# Patient Record
Sex: Male | Born: 1950
Health system: Southern US, Community
[De-identification: ages and names within clinical notes are randomized; demographics above are authoritative.]

## PROBLEM LIST (undated history)

## (undated) DIAGNOSIS — J45909 Unspecified asthma, uncomplicated: Secondary | ICD-10-CM

## (undated) DIAGNOSIS — I5020 Unspecified systolic (congestive) heart failure: Secondary | ICD-10-CM

## (undated) DIAGNOSIS — Z720 Tobacco use: Secondary | ICD-10-CM

## (undated) DIAGNOSIS — I10 Essential (primary) hypertension: Secondary | ICD-10-CM

## (undated) DIAGNOSIS — I509 Heart failure, unspecified: Secondary | ICD-10-CM

## (undated) DIAGNOSIS — C61 Malignant neoplasm of prostate: Secondary | ICD-10-CM

## (undated) DIAGNOSIS — I701 Atherosclerosis of renal artery: Secondary | ICD-10-CM

## (undated) DIAGNOSIS — F1010 Alcohol abuse, uncomplicated: Secondary | ICD-10-CM

## (undated) DIAGNOSIS — M109 Gout, unspecified: Secondary | ICD-10-CM

## (undated) DIAGNOSIS — I2510 Atherosclerotic heart disease of native coronary artery without angina pectoris: Secondary | ICD-10-CM

## (undated) DIAGNOSIS — J449 Chronic obstructive pulmonary disease, unspecified: Secondary | ICD-10-CM

## (undated) DIAGNOSIS — I428 Other cardiomyopathies: Secondary | ICD-10-CM

## (undated) DIAGNOSIS — I351 Nonrheumatic aortic (valve) insufficiency: Secondary | ICD-10-CM

## (undated) DIAGNOSIS — I480 Paroxysmal atrial fibrillation: Secondary | ICD-10-CM

## (undated) DIAGNOSIS — I340 Nonrheumatic mitral (valve) insufficiency: Secondary | ICD-10-CM

## (undated) DIAGNOSIS — N182 Chronic kidney disease, stage 2 (mild): Secondary | ICD-10-CM

## (undated) HISTORY — DX: Unspecified asthma, uncomplicated: J45.909

## (undated) HISTORY — PX: PROSTATE BIOPSY: SHX241

## (undated) HISTORY — DX: Malignant neoplasm of prostate: C61

## (undated) HISTORY — PX: APPENDECTOMY: SHX54

## (undated) HISTORY — DX: Essential (primary) hypertension: I10

---

## 2005-06-11 ENCOUNTER — Emergency Department (HOSPITAL_COMMUNITY): Admission: EM | Admit: 2005-06-11 | Discharge: 2005-06-11 | Payer: Self-pay | Admitting: Family Medicine

## 2006-11-28 ENCOUNTER — Emergency Department (HOSPITAL_COMMUNITY): Admission: EM | Admit: 2006-11-28 | Discharge: 2006-11-28 | Payer: Self-pay | Admitting: Emergency Medicine

## 2007-09-25 ENCOUNTER — Emergency Department (HOSPITAL_COMMUNITY): Admission: EM | Admit: 2007-09-25 | Discharge: 2007-09-25 | Payer: Self-pay | Admitting: Emergency Medicine

## 2010-11-18 LAB — URINALYSIS, ROUTINE W REFLEX MICROSCOPIC
Bilirubin Urine: NEGATIVE
Glucose, UA: NEGATIVE
Ketones, ur: NEGATIVE
Leukocytes, UA: NEGATIVE
Nitrite: NEGATIVE
Protein, ur: NEGATIVE
Specific Gravity, Urine: 1.014
Urobilinogen, UA: 1
pH: 5

## 2010-11-18 LAB — URINE MICROSCOPIC-ADD ON

## 2013-06-25 ENCOUNTER — Telehealth: Payer: Self-pay | Admitting: *Deleted

## 2013-06-25 NOTE — Telephone Encounter (Signed)
Called patient to introduce myself as Prostate Oncology Navigator and coordinator of the Prostate [place], to confirm his referral for the clinic on 07/03/13, location of [place], arrival time of 12:15, registration procedure, and format of clinic.  He verbalized understanding.  I provided my phone number and encouraged him to call me if he has any questions after receiving the Information Packet or prior to my call the day before clinic.  He verbalized understanding and expressed appreciation for my call.  Gayleen Orem, RN, BSN, [facility] Prostate Oncology Navigator [phone]

## 2013-06-28 ENCOUNTER — Telehealth: Payer: Self-pay | Admitting: Oncology

## 2013-06-28 NOTE — Telephone Encounter (Signed)
C/D 06/28/13 for appt. 07/03/13 °

## 2013-06-29 ENCOUNTER — Encounter: Payer: Self-pay | Admitting: Radiation Oncology

## 2013-06-29 ENCOUNTER — Telehealth: Payer: Self-pay | Admitting: *Deleted

## 2013-06-29 NOTE — Progress Notes (Signed)
GU Location of Tumor / Histology: prostatic adenocarcinoma  If Prostate Cancer, Gleason Score is (4 + 4) and PSA is (13.3). Prostate volume: 22.8  cc  Patient presented the Fall of 2014 with elevate PSA.  Biopsies of prostate (if applicable) revealed:    Past/Anticipated interventions by urology, if any: patient encouraged by [facility] to undergo surgical therapy  Past/Anticipated interventions by medical oncology, if any: None  Weight changes, if any: None noted  Bowel/Bladder complaints, if any: minimal lower urinary tract symptoms. IPSS 4 on 06/19/13   Nausea/Vomiting, if any: None noted  Pain issues, if any:  None noted  SAFETY ISSUES:  Prior radiation? NO  Pacemaker/ICD? NO  Possible current pregnancy? NO  Is the patient on methotrexate? 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

## 2013-06-29 NOTE — Telephone Encounter (Signed)
Called patient to see if he had any questions prior to his attendance at next [place].  He stated he did not.  He confirmed he would bring the completed health information forms though he hasn't checked his mail for its delivery since Wednesday.  He verified understanding of an arrival time of 12:15 and [place] location.  Gayleen Orem, RN, BSN, [facility] Prostate Oncology Navigator [phone]

## 2013-07-03 ENCOUNTER — Ambulatory Visit (HOSPITAL_BASED_OUTPATIENT_CLINIC_OR_DEPARTMENT_OTHER): Payer: No Typology Code available for payment source | Admitting: Oncology

## 2013-07-03 ENCOUNTER — Ambulatory Visit
Admission: RE | Admit: 2013-07-03 | Discharge: 2013-07-03 | Disposition: A | Discharge status: Home / Self Care | Payer: No Typology Code available for payment source | Source: Ambulatory Visit | Attending: Radiation Oncology | Admitting: Radiation Oncology

## 2013-07-03 ENCOUNTER — Encounter: Payer: Self-pay | Admitting: Radiation Oncology

## 2013-07-03 ENCOUNTER — Encounter: Payer: Self-pay | Admitting: Oncology

## 2013-07-03 ENCOUNTER — Encounter: Payer: Self-pay | Admitting: Specialist

## 2013-07-03 VITALS — BP 145/74 | HR 82 | Resp 16 | Ht 67.0 in | Wt 179.7 lb

## 2013-07-03 DIAGNOSIS — I10 Essential (primary) hypertension: Secondary | ICD-10-CM

## 2013-07-03 DIAGNOSIS — R3911 Hesitancy of micturition: Secondary | ICD-10-CM

## 2013-07-03 DIAGNOSIS — C61 Malignant neoplasm of prostate: Secondary | ICD-10-CM

## 2013-07-03 NOTE — Addendum Note (Signed)
Encounter addended by: Raynelle Bring, MD on: 07/03/2013  5:34 PM<BR>     Documentation filed: Clinical Notes

## 2013-07-03 NOTE — Progress Notes (Signed)
Met patient in [place]. He rated himself as a "2" on the distress scale and said he mostly needed medical information to make an "informed decision" and he felt he had gotten it in the clinic. Provided him with support center information on programs and services, especially the [place].  Epifania Gore, PhD, [place]

## 2013-07-03 NOTE — Consult Note (Signed)
Reason for Referral: Prostate cancer.   HPI: This is a pleasant 63 year old native of [place] but currently lives in [place]. 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 He was noted to have an increased PSA up to 13.3 in November of 2014. He underwent a biopsy on January of 2015 which showed a prostate cancer involving the right lobe with a Gleason score of 3+4 equals 7 in about 15% of the tissue. A left lobe showed a Gleason score 4+4 equals 8 and 20% of the tissue with a predominant pattern was 4+3 equals 7. He does have very few lower urinary tract symptoms including nocturia and hesitancy. Otherwise he is asymptomatic. He does not report any fevers or chills or sweats. Does not report any weight loss or appetite changes. He does not report any headaches blurred vision or double vision. Does not report any nausea or vomiting or abdominal pain. Does not report any musculoskeletal complaints. Does not report any arthralgias or myalgias. He is not reporting any petechiae or skin rashes or lesions. He continues to be very active and still works part-time as a Nutritional therapist regularly.   Past Medical History  Diagnosis Date  . Prostate cancer   . Hypertension   . Asthma   :  Past Surgical History  Procedure Laterality Date  . Appendectomy    . Prostate biopsy    :  Current outpatient prescriptions:sildenafil (REVATIO) 20 MG tablet, Take 20 mg by mouth as needed., Disp: , Rfl: :  No Known Allergies:  Family History  Problem Relation Age of Onset  . Cancer Neg Hx   :  History   Social History  . Marital Status: Single    Spouse Name: N/A    Number of Children: N/A  . Years of Education: N/A   Occupational History  . Not on file.   Social History Main Topics  . Smoking status: Current Every Day Smoker -- 1.00 packs/day for 30 years    Types: Cigarettes  . Smokeless tobacco: Never Used  .  Alcohol Use: Yes     Comment: 4 glasses of alcohol per day  . Drug Use: No  . Sexual Activity: Yes   Other Topics Concern  . Not on file   Social History Narrative  . No narrative on file  :  Pertinent items are noted in HPI.  Exam:  ECOG 0 There were no vitals taken for this visit. General appearance: alert and cooperative Head: Normocephalic, without obvious abnormality Throat: lips, mucosa, and tongue normal; teeth and gums normal Neck: no adenopathy and supple, symmetrical, trachea midline Back: symmetric, no curvature. ROM normal. No CVA tenderness. Resp: clear to auscultation bilaterally Cardio: regular rate and rhythm, S1, S2 normal, no murmur, click, rub or gallop GI: soft, non-tender; bowel sounds normal; no masses,  no organomegaly Extremities: extremities normal, atraumatic, no cyanosis or edema Pulses: 2+ and symmetric Skin: Skin color, texture, turgor normal. No rashes or lesions Lymph nodes: Cervical, supraclavicular, and axillary nodes normal.   Assessment and Plan:   63 year old gentleman with diagnosis of prostate cancer in January of 2015. He presented with a PSA of 13.3 and a Gleason score of 3+4 equals 7 in the majority of his gland and a 20% involvement of 4+4 equals 8. His clinical stage is T2 B. with very minimal lower urinary tract symptoms. His case was discussed and the prostate cancer multidisciplinary clinic.  His pathology and previous imaging studies from 2008 were discussed including a finding of mild hydronephrosis.  Options of treatments were discussed today through the clinic as well as with the patient. This will include up front surgical resection versus radiation therapy with androgen deprivation. Before making a decision, I do agree with obtaining CT scan of the chest abdomen and pelvis as well as a bone scan for staging purposes to rule out metastatic disease. Once these have been ruled out, then the options of local therapy is up to the patient  and I think it's reasonable to consider above.  I counseled him about the complications associated with androgen deprivation as well and all his questions were answered regarding that. I see no role for systemic chemotherapy or any other immune therapy at this time.

## 2013-07-03 NOTE — Progress Notes (Signed)
Please see consult note.  

## 2013-07-03 NOTE — Progress Notes (Signed)
Radiation Oncology         [phone] ________________________________  Multidisciplinary Prostate Cancer Clinic  Initial Radiation Oncology Consultation  Name: Leonard [name] MRN: [id number]  Date: 07/03/2013  DOB: 04-28-1950  CC:No PCP Per Patient  Leonard Bring, MD   REFERRING PHYSICIAN: Raynelle Bring, MD  DIAGNOSIS: 63 y.o. gentleman with stage T1c vs T2b adenocarcinoma of the prostate with a Gleason's score of 4+4 and a PSA of 13  HISTORY OF PRESENT ILLNESS::Leonard [name] is a 63 y.o. gentleman with a history of elevated PSA.  He was noted to have an elevated PSA of 13.3 in 11/14.  He sought evaluation in urology at [facility] on 01/03/13, digital rectal examination was performed at that time revealing a 20 cc gland with no nodules.  The patient proceeded to transrectal ultrasound biopsies of the prostate on 02/09/13 labelled right and left.  The prostate volume measured 22.8 cc.  Out of core biopsies, 15% of the right-side and 40% of the left side were positive.  The maximum Gleason score was labelled as "4+3=8" leading to some question about the true score of 7 or 8.  The patient saw Dr. Alinda Money on 06/19/13 for a second opinion and was felt on DRE to have a 45 gm gland with a induration from the left lateral mid gland to the apex consistent with T1b disease.    The patient reviewed the biopsy results with his urologist and he has kindly been referred today to the multidisciplinary prostate cancer clinic for presentation of pathology and radiology studies in our conference for discussion of potential radiation treatment options and clinical evaluation.  On our review, the highest Gleason's score was seen in the left and was 4+4=8   PREVIOUS RADIATION THERAPY: No  PAST MEDICAL HISTORY:  has a past medical history of Prostate cancer; Hypertension; and Asthma.    PAST SURGICAL HISTORY: Past Surgical History  Procedure Laterality Date  . Appendectomy    . Prostate biopsy       FAMILY HISTORY: family history is negative for Cancer.  SOCIAL HISTORY:  reports that he has been smoking Cigarettes.  He has a 60 pack-year smoking history. He has never used smokeless tobacco. He reports that he drinks alcohol. He reports that he uses illicit drugs (Marijuana).  ALLERGIES: Review of patient's allergies indicates no known allergies.  MEDICATIONS:  Current Outpatient Prescriptions  Medication Sig Dispense Refill  . lisinopril (PRINIVIL,ZESTRIL) 5 MG tablet Take 5 mg by mouth.      . sildenafil (REVATIO) 20 MG tablet Take 20 mg by mouth as needed.       No current facility-administered medications for this encounter.    REVIEW OF SYSTEMS:  A 15 point review of systems is documented in the electronic medical record. This was obtained by the nursing staff. However, I reviewed this with the patient to discuss relevant findings and make appropriate changes.  A comprehensive review of systems was negative..  The patient completed an IPSS and IIEF questionnaire.  His IPSS score was 1 indicating mild urinary outflow obstructive symptoms.  He indicated that his erectile function is able to complete sexual activity on half or more attempts.   PHYSICAL EXAM: This patient is in no acute distress.  He is alert and oriented.   height is 5\' 7"  (1.702 m) and weight is 179 lb 11.2 oz (81.511 kg). His blood pressure is 145/74 and his pulse is 82. His respiration is 16.  He exhibits no respiratory distress or  labored breathing.  He appears neurologically intact.  His mood is pleasant.  His affect is appropriate.  Please note the digital rectal exam findings described above.  KPS = 100  100 - Normal; no complaints; no evidence of disease. 90   - Able to carry on normal activity; minor signs or symptoms of disease. 80   - Normal activity with effort; some signs or symptoms of disease. 58   - Cares for self; unable to carry on normal activity or to do active work. 60   - Requires occasional  assistance, but is able to care for most of his personal needs. 50   - Requires considerable assistance and frequent medical care. 56   - Disabled; requires special care and assistance. 55   - Severely disabled; hospital admission is indicated although death not imminent. 60   - Very sick; hospital admission necessary; active supportive treatment necessary. 10   - Moribund; fatal processes progressing rapidly. 0     - Dead  Karnofsky DA, Abelmann WH, Craver LS and Burchenal JH [phone]) The use of the nitrogen mustards in the palliative treatment of carcinoma: with particular reference to bronchogenic carcinoma Cancer 1 634-56   LABORATORY DATA:  No results found for this basename: WBC,  HGB,  HCT,  MCV,  PLT   No results found for this basename: NA,  K,  CL,  CO2   No results found for this basename: ALT,  AST,  GGT,  ALKPHOS,  BILITOT     RADIOGRAPHY: No results found.    IMPRESSION: This gentleman is a 63 y.o. gentleman with stage T1c vs T2b adenocarcinoma of the prostate with a Gleason's score of 4+4 and a PSA of 13.  His T-Stage, Gleason's Score, and PSA put him into the high risk group.  Accordingly he is eligible for a variety of potential treatment options including prostatectomy or radiotherapy with androgen depravation.  PLAN:Today I reviewed the findings and workup thus far.  We discussed the natural history of prostate cancer.  We reviewed the the implications of T-stage, Gleason's Score, and PSA on decision-making and outcomes related to prostate cancer.  We discussed radiation treatment in the management of prostate cancer with regard to the logistics and delivery of external beam radiation treatment as well as the logistics and delivery of prostate brachytherapy.  We compared and contrasted each of these approaches and also compared these against prostatectomy.    The patient focused most of his questions and interest in robotic-assisted laparoscopic radical prostatectomy.  We  discussed some of the potential advantages of surgery including surgical staging, the availability of salvage radiotherapy to the prostatic fossa, and the confidence associated with immediate biochemical response.  We discussed some of the potential proven indications for postoperative radiotherapy including positive margins, extracapsular extension, and seminal vesicle involvement. We also talked about some of the other potential findings leading to a recommendation for radiotherapy including a non-zero postoperative PSA and positive lymph nodes.   I filled out a patient counseling form for him outlining his disease characteristics with relevant treatment diagrams and a thorough delineation of radiation including the logistics. The counseling form highlighted our discussion on the potential side effects associated with radiation therapy. The patient was given the form and we retained a copy for our records.   The patient would like to proceed with prostatectomy. I enjoyed meeting with him today, and will look forward to participating in the care of this very nice gentleman.  I will look  forward to following his progress   I spent time face to face with the patient and more than 50% of that time was spent in counseling and/or coordination of care.      ------------------------------------------------  Sheral Apley. Tammi Klippel, M.D.

## 2013-07-03 NOTE — Consult Note (Signed)
Chief Complaint  Prostate Cancer   History of Present Illness     Leonard [name] is a 64 year old gentleman who was noted to have an elevated PSA back in the fall of 2014 which was repeated in November 2014 and had remained elevated at 13.3.  He therefore underwent a prostate needle biopsy at [facility] on 02/09/13.  The pathology results likely indicate a typographical error but indicated Gleason score 3+4 = 7 adenocarcinoma in 15% of the right-sided biopsy tissue and Gleason score 4+3 = 8 involving 40% of the left-sided tissue.  Considering the left-sided Gleason score, I was unsure whether he actually had Gleason 4+3 = 7 disease or Gleason 4+4 = 8 disease.  He was counseled at [facility] and states that he was recommended to undergo surgical therapy.    On review of his surgical pathology slides today with Dr. Orene Desanctis, he appears to have 11 out of 12 cores positive for malignancy.  He does have at least one core Gleason 4+4 = 8 on the left side confirming high risk disease.  Overall, he had 6 out of 6 cores that were positive on the left side in 5 out of 6 cores positive on the right side.  We also reviewed a CT scan that he had performed in the [facility] system in 2008 which demonstrated incidentally detected left hydronephrosis.  On further discussion with the patient, he remembers an episode of severe pain around that time although does not recall passing a kidney stone or the exact explanation for his imaging findings.  There was no stone or other clear etiology for his hydronephrosis noted on that CT.  He works as a Chief Operating Officer.  He has no major medical comorbidities except for a history of hypertension.  He does admit to drinking approximately 4 glasses of alcohol per day.  He has smoked 1 pack of cigarettes per day for 30 years.  TNM stage: cT2b Nx Mx PSA: 13.3 Gleason score: 4+4 = 8 Biopsy (1/2/5 -[facility] read by Dr. Mickey Farber. Qasem, Acc # [id number]) Prostate Volume: 22.8 cc  Urinary  function: He has minimal lower urinary tract symptoms.  IPSS is 4. Erectile function: SHIM score is 16.  He does take Viagra prn and finds this to be effective almost every time.  His main problem is typically maintaining an erection although this is rarely a problem when he does take Viagra.       Past Medical History  1. History of asthma (V12.69)  2. History of hypertension (V12.59)  Surgical History  1. History of Appendectomy  Current Meds  1. No Reported Medications Recorded  Allergies  1. No Known Drug Allergies  Family History  1. Denied: Family history of prostate cancer  Social History   Alcohol use (V49.89)   Current every day smoker (305.1)   Single  Physical Exam Constitutional: Well nourished and well developed . No acute distress.  Pulmonary: No respiratory distress and normal respiratory rhythm and effort.  Cardiovascular: Heart rate and rhythm are normal . No peripheral edema.    Results/Data  I independently reviewed his medical records, PSA result, pathology slides, and CT scan from 2008.  Findings are as dictated above.     Assessment  1. Prostate cancer (185)  Discussion/Summary   1.  High-risk prostate cancer: Considering the confirmation has high-risk disease, I have recommended that he proceed with a full staging evaluation.  Considering his history of incidentally detected left hydronephrosis, I recommended a CT scan  of the abdomen and pelvis to evaluate his history of hydronephrosis and to further stage his prostate cancer.  I also recommended a bone scan.  If he has no evidence for metastatic disease, he has elected to proceed with surgical treatment after further discussion with Dr. Tammi Klippel, Dr. Alen Blew, and myself today.   If his initial staging evaluation is negative for metastatic disease, he would like to proceed with an MRI to determine whether it would be appropriate to consider a nerve sparing procedure considering his high-risk disease  and recent digital rectal exam.  Erectile function is very important to him and he would like to proceed with a nerve sparing procedure if possible.  2.  History of left hydronephrosis: This will be evaluated further on his upcoming CT scan of the abdomen and pelvis.  Cc: Dr. Zola Button Dr. Tyler Pita   A total of 35 minutes were spent in the overall care of the patient today with 35 minutes in direct face to face consultation.    Signatures Electronically signed by : Raynelle Bring, M.D.; Jul 03 2013  5:32PM EST

## 2013-07-03 NOTE — Progress Notes (Addendum)
Denies history of radiation therapy or that he has a pacemaker. Single. Bartender. Significant other: V. Vickki Muff. Denies having any children. Denies having a colonoscopy. Reports that he conducts regular testicular self exams. Reports fatigue. Wears glasses. Reports sinus problems.

## 2013-07-04 ENCOUNTER — Other Ambulatory Visit (HOSPITAL_COMMUNITY): Payer: Self-pay | Admitting: Urology

## 2013-07-04 DIAGNOSIS — C61 Malignant neoplasm of prostate: Secondary | ICD-10-CM

## 2013-07-04 NOTE — Progress Notes (Signed)
Met with patient as part of Prostate [facility].  Reintroduced my role as his navigator and encouraged him to call as he proceeds with treatments and appointments at [facility].  Provided the accompanying Care Plan Summary:                                        Care Plan Summary  Name:  Leonard [name] DOB:  1950/11/15  Your Medical Team:   Urologist -  Dr. Raynelle Bring, [facility]  Radiation Oncologist - Dr. Tyler Pita, [facility]   Medical Oncologist - Dr. Zola Button, [place] Recommendations: 1) Surgery * This recommendation is based on information available as of today's consult.      Recommendations may change depending on the results of further tests or exams. Next Steps: 1) Bone scan, CT abdomen & pelvis - [facility] to schedule. 2) If imaging negative, MRI for surgical planning.  When appointments need to be scheduled, you will be contacted by [facility] and/or [facility].  Questions?  Please do not hesitate to call Gayleen Orem, RN, BSN, [facility] at [phone] with any questions or concerns.  Liliane Channel is Counsellor and is available to assist you while you're receiving your medical care at [facility]. ______________________________________________________________________________________________________________________  I encouraged him to call me with any questions or concerns as his treatments progress.  He indicated understanding.  Gayleen Orem, RN, BSN, [facility] Prostate Oncology Navigator [phone]   .

## 2013-07-04 NOTE — Addendum Note (Signed)
Encounter addended by: Brooks Sailors, RN on: 07/04/2013  5:40 PM<BR>     Documentation filed: Notes Section

## 2013-07-17 ENCOUNTER — Encounter (HOSPITAL_COMMUNITY): Payer: No Typology Code available for payment source

## 2013-07-17 ENCOUNTER — Ambulatory Visit (HOSPITAL_COMMUNITY): Payer: No Typology Code available for payment source

## 2013-07-24 ENCOUNTER — Ambulatory Visit (HOSPITAL_COMMUNITY): Admission: RE | Admit: 2013-07-24 | Payer: No Typology Code available for payment source | Source: Ambulatory Visit

## 2013-07-24 ENCOUNTER — Encounter (HOSPITAL_COMMUNITY): Payer: No Typology Code available for payment source

## 2013-08-09 ENCOUNTER — Other Ambulatory Visit (HOSPITAL_COMMUNITY): Payer: Self-pay | Admitting: Urology

## 2013-08-09 DIAGNOSIS — C61 Malignant neoplasm of prostate: Secondary | ICD-10-CM

## 2013-08-20 ENCOUNTER — Other Ambulatory Visit (HOSPITAL_COMMUNITY): Payer: Self-pay | Admitting: Urology

## 2013-08-20 DIAGNOSIS — C61 Malignant neoplasm of prostate: Secondary | ICD-10-CM

## 2013-08-20 DIAGNOSIS — R1011 Right upper quadrant pain: Secondary | ICD-10-CM

## 2013-08-22 ENCOUNTER — Other Ambulatory Visit: Payer: Self-pay | Admitting: Urology

## 2013-08-27 ENCOUNTER — Ambulatory Visit (HOSPITAL_COMMUNITY)
Admission: RE | Admit: 2013-08-27 | Discharge: 2013-08-27 | Disposition: A | Discharge status: Home / Self Care | Payer: No Typology Code available for payment source | Source: Ambulatory Visit | Attending: Urology | Admitting: Urology

## 2013-08-27 ENCOUNTER — Encounter (HOSPITAL_COMMUNITY)
Admission: RE | Admit: 2013-08-27 | Discharge: 2013-08-27 | Disposition: A | Discharge status: Home / Self Care | Payer: No Typology Code available for payment source | Source: Ambulatory Visit | Attending: Urology | Admitting: Urology

## 2013-08-27 DIAGNOSIS — K769 Liver disease, unspecified: Secondary | ICD-10-CM | POA: Insufficient documentation

## 2013-08-27 DIAGNOSIS — R1011 Right upper quadrant pain: Secondary | ICD-10-CM | POA: Insufficient documentation

## 2013-08-27 DIAGNOSIS — C61 Malignant neoplasm of prostate: Secondary | ICD-10-CM

## 2013-08-27 MED ORDER — TECHNETIUM TC 99M MEDRONATE IV KIT
23.2000 | PACK | Freq: Once | INTRAVENOUS | Status: AC | PRN
  Administered 2013-08-27: 23.2 via INTRAVENOUS

## 2013-09-11 ENCOUNTER — Encounter (HOSPITAL_COMMUNITY): Payer: Self-pay | Admitting: Pharmacy Technician

## 2013-09-17 NOTE — Patient Instructions (Signed)
Leonard [name]  09/17/2013                           YOUR PROCEDURE IS SCHEDULED ON: 09/24/13               ENTER THRU [facility] ENTRANCE AND                          FOLLOW  SIGNS TO SHORT STAY CENTER                 ARRIVE AT SHORT STAY AT:  9:00 AM               CALL THIS NUMBER IF ANY PROBLEMS THE DAY OF SURGERY :               [phone]                                REMEMBER:   Do not eat food or drink liquids AFTER MIDNIGHT                  Take these medicines the morning of surgery with               A SIPS OF WATER :         Do not wear jewelry, make-up   Do not wear lotions, powders, or perfumes.   Do not shave legs or underarms 12 hrs. before surgery (men may shave face)  Do not bring valuables to the hospital.  Contacts, dentures or bridgework may not be worn into surgery.  Leave suitcase in the car. After surgery it may be brought to your room.  For patients admitted to the hospital more than one night, checkout time is            11:00 AM                                                          ________________________________________________________________________                                                                        [place]  Before surgery, you can play an important role.  Because skin is not sterile, your skin needs to be as free of germs as possible.  You can reduce the number of germs on your skin by washing with CHG (chlorahexidine gluconate) soap before surgery.  CHG is an antiseptic cleaner which kills germs and bonds with the skin to continue killing germs even after washing. Please DO NOT use if you have an allergy to CHG or antibacterial soaps.  If your skin becomes reddened/irritated stop using the CHG and inform your nurse when you arrive at Short Stay. Do not shave (including legs and underarms) for at least 48 hours prior to the first CHG shower.  You may shave your  face. Please follow these instructions carefully:   1.  Shower with CHG Soap the night before surgery and the  morning of Surgery.   2.  If you choose to wash your hair, wash your hair first as usual with your  normal  Shampoo.   3.  After you shampoo, rinse your hair and body thoroughly to remove the  shampoo.                                         4.  Use CHG as you would any other liquid soap.  You can apply chg directly  to the skin and wash . Gently wash with scrungie or clean wascloth    5.  Apply the CHG Soap to your body ONLY FROM THE NECK DOWN.   Do not use on open                           Wound or open sores. Avoid contact with eyes, ears mouth and genitals (private parts).                        Genitals (private parts) with your normal soap.              6.  Wash thoroughly, paying special attention to the area where your surgery  will be performed.   7.  Thoroughly rinse your body with warm water from the neck down.   8.  DO NOT shower/wash with your normal soap after using and rinsing off  the CHG Soap .                9.  Pat yourself dry with a clean towel.             10.  Wear clean pajamas.             11.  Place clean sheets on your bed the night of your first shower and do not  sleep with pets.  Day of Surgery : Do not apply any lotions/deodorants the morning of surgery.  Please wear clean clothes to the hospital/surgery center.  FAILURE TO FOLLOW THESE INSTRUCTIONS MAY RESULT IN THE CANCELLATION OF YOUR SURGERY    PATIENT SIGNATURE_________________________________  ______________________________________________________________________     [name]  An incentive spirometer is a tool that can help keep your lungs clear and active. This tool measures how well you are filling your lungs with each breath. Taking long deep breaths may help reverse or decrease the chance of developing breathing (pulmonary) problems (especially infection)  following:  A long period of time when you are unable to move or be active. BEFORE THE PROCEDURE   If the spirometer includes an indicator to show your best effort, your nurse or respiratory therapist will set it to a desired goal.  If possible, sit up straight or lean slightly forward. Try not to slouch.  Hold the incentive spirometer in an upright position. INSTRUCTIONS FOR USE  1. Sit on the edge of your bed if possible, or sit up as far as you can in bed or on a chair. 2. Hold the incentive spirometer in an upright position. 3. Breathe out normally. 4. Place the mouthpiece in your mouth and seal your lips tightly around it. 5. Breathe in slowly and as deeply as possible, raising  the piston or the ball toward the top of the column. 6. Hold your breath for 3-5 seconds or for as long as possible. Allow the piston or ball to fall to the bottom of the column. 7. Remove the mouthpiece from your mouth and breathe out normally. 8. Rest for a few seconds and repeat Steps 1 through 7 at least 10 times every 1-2 hours when you are awake. Take your time and take a few normal breaths between deep breaths. 9. The spirometer may include an indicator to show your best effort. Use the indicator as a goal to work toward during each repetition. 10. After each set of 10 deep breaths, practice coughing to be sure your lungs are clear. If you have an incision (the cut made at the time of surgery), support your incision when coughing by placing a pillow or rolled up towels firmly against it. Once you are able to get out of bed, walk around indoors and cough well. You may stop using the incentive spirometer when instructed by your caregiver.  RISKS AND COMPLICATIONS  Take your time so you do not get dizzy or light-headed.  If you are in pain, you may need to take or ask for pain medication before doing incentive spirometry. It is harder to take a deep breath if you are having pain. AFTER USE  Rest and  breathe slowly and easily.  It can be helpful to keep track of a log of your progress. Your caregiver can provide you with a simple table to help with this. If you are using the spirometer at home, follow these instructions: [place] IF:   You are having difficultly using the spirometer.  You have trouble using the spirometer as often as instructed.  Your pain medication is not giving enough relief while using the spirometer.  You develop fever of 100.5 F (38.1 C) or higher. SEEK IMMEDIATE MEDICAL CARE IF:   You cough up bloody sputum that had not been present before.  You develop fever of 102 F (38.9 C) or greater.  You develop worsening pain at or near the incision site. MAKE SURE YOU:   Understand these instructions.  Will watch your condition.  Will get help right away if you are not doing well or get worse. Document Released: 06/07/2006 Document Revised: 04/19/2011 Document Reviewed: 08/08/2006 Exit[employer] Patient Information 2014 Exit[employer], [place].   ________________________________________________________________________  WHAT IS A BLOOD TRANSFUSION? Blood Transfusion Information  A transfusion is the replacement of blood or some of its parts. Blood is made up of multiple cells which provide different functions.  Red blood cells carry oxygen and are used for blood loss replacement.  White blood cells fight against infection.  Platelets control bleeding.  Plasma helps clot blood.  Other blood products are available for specialized needs, such as hemophilia or other clotting disorders. BEFORE THE TRANSFUSION  Who gives blood for transfusions?   Healthy volunteers who are fully evaluated to make sure their blood is safe. This is blood bank blood. Transfusion therapy is the safest it has ever been in the practice of medicine. Before blood is taken from a donor, a complete history is taken to make sure that person has no history of diseases nor engages in  risky social behavior (examples are intravenous drug use or sexual activity with multiple partners). The donor's travel history is screened to minimize risk of transmitting infections, such as malaria. The donated blood is tested for signs of infectious diseases, such as HIV  and hepatitis. The blood is then tested to be sure it is compatible with you in order to minimize the chance of a transfusion reaction. If you or a relative donates blood, this is often done in anticipation of surgery and is not appropriate for emergency situations. It takes many days to process the donated blood. RISKS AND COMPLICATIONS Although transfusion therapy is very safe and saves many lives, the main dangers of transfusion include:   Getting an infectious disease.  Developing a transfusion reaction. This is an allergic reaction to something in the blood you were given. Every precaution is taken to prevent this. The decision to have a blood transfusion has been considered carefully by your caregiver before blood is given. Blood is not given unless the benefits outweigh the risks. AFTER THE TRANSFUSION  Right after receiving a blood transfusion, you will usually feel much better and more energetic. This is especially true if your red blood cells have gotten low (anemic). The transfusion raises the level of the red blood cells which carry oxygen, and this usually causes an energy increase.  The nurse administering the transfusion will monitor you carefully for complications. HOME CARE INSTRUCTIONS  No special instructions are needed after a transfusion. You may find your energy is better. Speak with your caregiver about any limitations on activity for underlying diseases you may have. SEEK MEDICAL CARE IF:   Your condition is not improving after your transfusion.  You develop redness or irritation at the intravenous (IV) site. SEEK IMMEDIATE MEDICAL CARE IF:  Any of the following symptoms occur over the next 12  hours:  Shaking chills.  You have a temperature by mouth above 102 F (38.9 C), not controlled by medicine.  Chest, back, or muscle pain.  People around you feel you are not acting correctly or are confused.  Shortness of breath or difficulty breathing.  Dizziness and fainting.  You get a rash or develop hives.  You have a decrease in urine output.  Your urine turns a dark color or changes to pink, red, or brown. Any of the following symptoms occur over the next 10 days:  You have a temperature by mouth above 102 F (38.9 C), not controlled by medicine.  Shortness of breath.  Weakness after normal activity.  The white part of the eye turns yellow (jaundice).  You have a decrease in the amount of urine or are urinating less often.  Your urine turns a dark color or changes to pink, red, or brown. Document Released: 01/23/2000 Document Revised: 04/19/2011 Document Reviewed: 09/11/2007 [facility] Patient Information 2014 [place], [place].  _______________________________________________________________________

## 2013-09-19 ENCOUNTER — Inpatient Hospital Stay (HOSPITAL_COMMUNITY)
Admission: RE | Admit: 2013-09-19 | Discharge: 2013-09-19 | Disposition: A | Discharge status: Home / Self Care | Payer: No Typology Code available for payment source | Source: Ambulatory Visit

## 2013-09-21 ENCOUNTER — Encounter (HOSPITAL_COMMUNITY): Payer: Self-pay

## 2013-09-21 ENCOUNTER — Ambulatory Visit (HOSPITAL_COMMUNITY)
Admission: RE | Admit: 2013-09-21 | Discharge: 2013-09-21 | Disposition: A | Discharge status: Home / Self Care | Payer: No Typology Code available for payment source | Source: Ambulatory Visit | Attending: Urology | Admitting: Urology

## 2013-09-21 ENCOUNTER — Encounter (HOSPITAL_COMMUNITY)
Admission: RE | Admit: 2013-09-21 | Discharge: 2013-09-21 | Disposition: A | Discharge status: Home / Self Care | Payer: No Typology Code available for payment source | Source: Ambulatory Visit | Attending: Urology | Admitting: Urology

## 2013-09-21 ENCOUNTER — Encounter (INDEPENDENT_AMBULATORY_CARE_PROVIDER_SITE_OTHER): Payer: Self-pay

## 2013-09-21 ENCOUNTER — Other Ambulatory Visit (HOSPITAL_COMMUNITY): Payer: Self-pay | Admitting: *Deleted

## 2013-09-21 DIAGNOSIS — C61 Malignant neoplasm of prostate: Secondary | ICD-10-CM | POA: Insufficient documentation

## 2013-09-21 LAB — BASIC METABOLIC PANEL
Anion gap: 13 (ref 5–15)
BUN: 12 mg/dL (ref 6–23)
CO2: 24 mEq/L (ref 19–32)
Calcium: 9.3 mg/dL (ref 8.4–10.5)
Chloride: 103 mEq/L (ref 96–112)
Creatinine, Ser: 1.07 mg/dL (ref 0.50–1.35)
GFR calc Af Amer: 83 mL/min — ABNORMAL LOW (ref >90)
GFR calc non Af Amer: 72 mL/min — ABNORMAL LOW (ref >90)
Glucose, Bld: 166 mg/dL — ABNORMAL HIGH (ref 70–99)
Potassium: 3.6 mEq/L — ABNORMAL LOW (ref 3.7–5.3)
Sodium: 140 mEq/L (ref 137–147)

## 2013-09-21 LAB — CBC
HCT: 41.8 % (ref 39.0–52.0)
Hemoglobin: 14.9 g/dL (ref 13.0–17.0)
MCH: 33.5 pg (ref 26.0–34.0)
MCHC: 35.6 g/dL (ref 30.0–36.0)
MCV: 93.9 fL (ref 78.0–100.0)
Platelets: 220 10*3/uL (ref 150–400)
RBC: 4.45 MIL/uL (ref 4.22–5.81)
RDW: 12.7 % (ref 11.5–15.5)
WBC: 9 10*3/uL (ref 4.0–10.5)

## 2013-09-21 NOTE — Patient Instructions (Addendum)
20     Your procedure is scheduled on:  Monday 09/24/2013  Report to [facility] Main Entrance and follow signs to Short Stay  At  0900  AM.  Call this number if you have problems the night before or morning of surgery:   [phone]   Remember: Follow bowel prep instructions from Dr. Lynne Logan office !                Do not eat food or drink liquids AFTER MIDNIGHT!  Take these medicines the morning of surgery with A SIP OF WATER: NONE    [facility] IS NOT RESPONSIBLE FOR ANY BELONGINGS OR VALUABLES BROUGHT TO HOSPITAL.  Marland Kitchen  Leave suitcase in the car. After surgery it may be brought to your room.  For patients admitted to the hospital, checkout time is 11:00 AM the day of              Discharge.    DO NOT WEAR JEWELRY,MAKE-UP,LOTIONS,POWDERS,PERFUMES,CONTACTS , DENTURES OR BRIDGEWORK ,AND DO NOT WEAR FALSE EYELASHES                                    Patients discharged the day of surgery will not be allowed to drive home.  If going home the same day of surgery, must have someone stay with you  first 24 hrs.at home and arrange for someone to drive you home from the [place]: N/A   Special Instructions:              Please read over the following fact sheets that you were given:             1. Jensen.Tobin Chad     [phone]                              [facility] - Preparing for Surgery Before surgery, you can play an important role.  Because skin is not sterile, your skin needs to be as free of germs as possible.  You can reduce the number of germs on your skin by washing with CHG (chlorahexidine gluconate) soap before surgery.  CHG is an antiseptic cleaner which kills germs and bonds with the skin to continue killing germs even after washing. Please DO NOT use if you have an allergy to CHG or antibacterial  soaps.  If your skin becomes reddened/irritated stop using the CHG and inform your nurse when you arrive at Short Stay. Do not shave (including legs and underarms) for at least 48 hours prior to the first CHG shower.  You may shave your face/neck. Please follow these instructions carefully:  1.  Shower with CHG Soap the night before surgery and the  morning of Surgery.  2.  If you choose to wash your hair, wash your hair first as usual with your  normal  shampoo.  3.  After you shampoo, rinse your hair and body thoroughly to remove the  shampoo.                           4.  Use CHG as you would any other liquid soap.  You can apply chg directly  to the skin and wash                       Gently with a scrungie or clean washcloth.  5.  Apply the CHG Soap to your body ONLY FROM THE NECK DOWN.   Do not use on face/ open                           Wound or open sores. Avoid contact with eyes, ears mouth and genitals (private parts).                       Wash face,  Genitals (private parts) with your normal soap.             6.  Wash thoroughly, paying special attention to the area where your surgery  will be performed.  7.  Thoroughly rinse your body with warm water from the neck down.  8.  DO NOT shower/wash with your normal soap after using and rinsing off  the CHG Soap.                9.  Pat yourself dry with a clean towel.            10.  Wear clean pajamas.            11.  Place clean sheets on your bed the night of your first shower and do not  sleep with pets. Day of Surgery : Do not apply any lotions/deodorants the morning of surgery.  Please wear clean clothes to the hospital/surgery center.  FAILURE TO FOLLOW THESE INSTRUCTIONS MAY RESULT IN THE CANCELLATION OF YOUR SURGERY PATIENT SIGNATURE_________________________________  NURSE SIGNATURE__________________________________  ________________________________________________________________________   [name]  An  incentive spirometer is a tool that can help keep your lungs clear and active. This tool measures how well you are filling your lungs with each breath. Taking long deep breaths may help reverse or decrease the chance of developing breathing (pulmonary) problems (especially infection) following:  A long period of time when you are unable to move or be active. BEFORE THE PROCEDURE   If the spirometer includes an indicator to show your best effort, your nurse or respiratory therapist will set it to a desired goal.  If possible, sit up straight or lean slightly forward. Try not to slouch.  Hold the incentive spirometer in an upright position. INSTRUCTIONS FOR USE  1. Sit on the edge of your bed if possible, or sit up as far as you can in bed or on a chair. 2. Hold the incentive spirometer in an upright position. 3. Breathe out normally. 4. Place the mouthpiece in your mouth and seal your lips tightly around it. 5. Breathe in slowly and as deeply as possible, raising the piston or the ball toward the top of the column. 6. Hold your breath for 3-5 seconds or for as long as possible. Allow the piston or ball to fall to the bottom of the column. 7. Remove the mouthpiece from your mouth and breathe out normally. 8. Rest  for a few seconds and repeat Steps 1 through 7 at least 10 times every 1-2 hours when you are awake. Take your time and take a few normal breaths between deep breaths. 9. The spirometer may include an indicator to show your best effort. Use the indicator as a goal to work toward during each repetition. 10. After each set of 10 deep breaths, practice coughing to be sure your lungs are clear. If you have an incision (the cut made at the time of surgery), support your incision when coughing by placing a pillow or rolled up towels firmly against it. Once you are able to get out of bed, walk around indoors and cough well. You may stop using the incentive spirometer when instructed by your  caregiver.  RISKS AND COMPLICATIONS  Take your time so you do not get dizzy or light-headed.  If you are in pain, you may need to take or ask for pain medication before doing incentive spirometry. It is harder to take a deep breath if you are having pain. AFTER USE  Rest and breathe slowly and easily.  It can be helpful to keep track of a log of your progress. Your caregiver can provide you with a simple table to help with this. If you are using the spirometer at home, follow these instructions: [place] IF:   You are having difficultly using the spirometer.  You have trouble using the spirometer as often as instructed.  Your pain medication is not giving enough relief while using the spirometer.  You develop fever of 100.5 F (38.1 C) or higher. SEEK IMMEDIATE MEDICAL CARE IF:   You cough up bloody sputum that had not been present before.  You develop fever of 102 F (38.9 C) or greater.  You develop worsening pain at or near the incision site. MAKE SURE YOU:   Understand these instructions.  Will watch your condition.  Will get help right away if you are not doing well or get worse. Document Released: 06/07/2006 Document Revised: 04/19/2011 Document Reviewed: 08/08/2006 Exit[employer] Patient Information 2014 [employer], [place].   ________________________________________________________________________  WHAT IS A BLOOD TRANSFUSION? Blood Transfusion Information  A transfusion is the replacement of blood or some of its parts. Blood is made up of multiple cells which provide different functions.  Red blood cells carry oxygen and are used for blood loss replacement.  White blood cells fight against infection.  Platelets control bleeding.  Plasma helps clot blood.  Other blood products are available for specialized needs, such as hemophilia or other clotting disorders. BEFORE THE TRANSFUSION  Who gives blood for transfusions?   Healthy volunteers who are fully  evaluated to make sure their blood is safe. This is blood bank blood. Transfusion therapy is the safest it has ever been in the practice of medicine. Before blood is taken from a donor, a complete history is taken to make sure that person has no history of diseases nor engages in risky social behavior (examples are intravenous drug use or sexual activity with multiple partners). The donor's travel history is screened to minimize risk of transmitting infections, such as malaria. The donated blood is tested for signs of infectious diseases, such as HIV and hepatitis. The blood is then tested to be sure it is compatible with you in order to minimize the chance of a transfusion reaction. If you or a relative donates blood, this is often done in anticipation of surgery and is not appropriate for emergency situations. It takes many  days to process the donated blood. RISKS AND COMPLICATIONS Although transfusion therapy is very safe and saves many lives, the main dangers of transfusion include:   Getting an infectious disease.  Developing a transfusion reaction. This is an allergic reaction to something in the blood you were given. Every precaution is taken to prevent this. The decision to have a blood transfusion has been considered carefully by your caregiver before blood is given. Blood is not given unless the benefits outweigh the risks. AFTER THE TRANSFUSION  Right after receiving a blood transfusion, you will usually feel much better and more energetic. This is especially true if your red blood cells have gotten low (anemic). The transfusion raises the level of the red blood cells which carry oxygen, and this usually causes an energy increase.  The nurse administering the transfusion will monitor you carefully for complications. HOME CARE INSTRUCTIONS  No special instructions are needed after a transfusion. You may find your energy is better. Speak with your caregiver about any limitations on activity  for underlying diseases you may have. SEEK MEDICAL CARE IF:   Your condition is not improving after your transfusion.  You develop redness or irritation at the intravenous (IV) site. SEEK IMMEDIATE MEDICAL CARE IF:  Any of the following symptoms occur over the next 12 hours:  Shaking chills.  You have a temperature by mouth above 102 F (38.9 C), not controlled by medicine.  Chest, back, or muscle pain.  People around you feel you are not acting correctly or are confused.  Shortness of breath or difficulty breathing.  Dizziness and fainting.  You get a rash or develop hives.  You have a decrease in urine output.  Your urine turns a dark color or changes to pink, red, or brown. Any of the following symptoms occur over the next 10 days:  You have a temperature by mouth above 102 F (38.9 C), not controlled by medicine.  Shortness of breath.  Weakness after normal activity.  The white part of the eye turns yellow (jaundice).  You have a decrease in the amount of urine or are urinating less often.  Your urine turns a dark color or changes to pink, red, or brown. Document Released: 01/23/2000 Document Revised: 04/19/2011 Document Reviewed: 09/11/2007 [facility] Patient Information 2014 [place], [place].  _______________________________________________________________________

## 2013-09-21 NOTE — Progress Notes (Signed)
09/21/13 1133  OBSTRUCTIVE SLEEP APNEA  Have you ever been diagnosed with sleep apnea through a sleep study? No  Do you snore loudly (loud enough to be heard through closed doors)?  1  Do you often feel tired, fatigued, or sleepy during the daytime? 1  Has anyone observed you stop breathing during your sleep? 0  Do you have, or are you being treated for high blood pressure? 0  BMI more than 35 kg/m2? 0  Age over 64 years old? 1  Neck circumference greater than 40 cm/16 inches? 1  Gender: 1  Obstructive Sleep Apnea Score 5  Score 4 or greater  Results sent to PCP

## 2013-09-21 NOTE — H&P (Signed)
  Chief Complaint  Prostate Cancer   History of Present Illness     Leonard [name] is a 63 year old gentleman who was noted to have an elevated PSA back in the fall of 2014 which was repeated in November 2014 and had remained elevated at 13.3.  He therefore underwent a prostate needle biopsy at [facility] on 02/09/13.  The pathology results likely indicate a typographical error but indicated Gleason score 3+4 = 7 adenocarcinoma in 15% of the right-sided biopsy tissue and Gleason score 4+3 = 8 involving 40% of the left-sided tissue.  Considering the left-sided Gleason score, I was unsure whether he actually had Gleason 4+3 = 7 disease or Gleason 4+4 = 8 disease.    His staging studies are negative for metastatic disease.  On review of his surgical pathology slides today with Dr. Orene Desanctis, he appears to have 11 out of 12 cores positive for malignancy.  He does have at least one core Gleason 4+4 = 8 on the left side confirming high risk disease.  Overall, he had 6 out of 6 cores that were positive on the left side in 5 out of 6 cores positive on the right side.  He works as a Chief Operating Officer.  He has no major medical comorbidities except for a history of hypertension.  He does admit to drinking approximately 4 glasses of alcohol per day.  He has smoked 1 pack of cigarettes per day for 30 years.  TNM stage: cT2b Nx Mx PSA: 13.3 Gleason score: 4+4 = 8 Biopsy (1/2/5 -[facility] read by Dr. Mickey Farber. Qasem, Acc # [id number]) Prostate Volume: 22.8 cc  Urinary function: He has minimal lower urinary tract symptoms.  IPSS is 4. Erectile function: SHIM score is 16.  He does take Viagra prn and finds this to be effective almost every time.  His main problem is typically maintaining an erection although this is rarely a problem when he does take Viagra.       Past Medical History  1. History of asthma (V12.69)  2. History of hypertension (V12.59)  Surgical History  1. History of Appendectomy  Current Meds  1. No  Reported Medications Recorded  Allergies  1. No Known Drug Allergies  Family History  1. Denied: Family history of prostate cancer  Social History   Alcohol use (V49.89)   Current every day smoker (305.1)   Single  Physical Exam Constitutional: Well nourished and well developed . No acute distress.  Pulmonary: No respiratory distress and normal respiratory rhythm and effort.  Cardiovascular: Heart rate and rhythm are normal . No peripheral edema.      Assessment  1. Prostate cancer (185)  Discussion/Summary   1.  High-risk prostate cancer: He has elected to proceed with surgical treatment and will undergo a RAL radical prostatectomy and BPLND.

## 2013-09-24 ENCOUNTER — Encounter (HOSPITAL_COMMUNITY)
Admission: RE | Disposition: A | Discharge status: Home / Self Care | Payer: Self-pay | Source: Ambulatory Visit | Attending: Urology

## 2013-09-24 ENCOUNTER — Inpatient Hospital Stay (HOSPITAL_COMMUNITY)
Admission: RE | Admit: 2013-09-24 | Discharge: 2013-09-26 | DRG: 707 | Disposition: A | Discharge status: Home / Self Care | Payer: No Typology Code available for payment source | Source: Ambulatory Visit | Attending: Urology | Admitting: Urology

## 2013-09-24 ENCOUNTER — Encounter (HOSPITAL_COMMUNITY): Payer: No Typology Code available for payment source | Admitting: Anesthesiology

## 2013-09-24 ENCOUNTER — Encounter (HOSPITAL_COMMUNITY): Payer: Self-pay | Admitting: *Deleted

## 2013-09-24 ENCOUNTER — Inpatient Hospital Stay (HOSPITAL_COMMUNITY): Payer: No Typology Code available for payment source | Admitting: Anesthesiology

## 2013-09-24 DIAGNOSIS — F17200 Nicotine dependence, unspecified, uncomplicated: Secondary | ICD-10-CM | POA: Diagnosis present

## 2013-09-24 DIAGNOSIS — K560 Paralytic ileus: Secondary | ICD-10-CM | POA: Diagnosis not present

## 2013-09-24 DIAGNOSIS — I10 Essential (primary) hypertension: Secondary | ICD-10-CM | POA: Diagnosis present

## 2013-09-24 DIAGNOSIS — C61 Malignant neoplasm of prostate: Secondary | ICD-10-CM | POA: Diagnosis present

## 2013-09-24 DIAGNOSIS — J45909 Unspecified asthma, uncomplicated: Secondary | ICD-10-CM | POA: Diagnosis present

## 2013-09-24 HISTORY — PX: ROBOT ASSISTED LAPAROSCOPIC RADICAL PROSTATECTOMY: SHX5141

## 2013-09-24 HISTORY — PX: LYMPHADENECTOMY: SHX5960

## 2013-09-24 LAB — HEMOGLOBIN AND HEMATOCRIT, BLOOD
HCT: 40.9 % (ref 39.0–52.0)
Hemoglobin: 14.5 g/dL (ref 13.0–17.0)

## 2013-09-24 LAB — TYPE AND SCREEN
ABO/RH(D): O NEG
Antibody Screen: NEGATIVE

## 2013-09-24 LAB — ABO/RH: ABO/RH(D): O NEG

## 2013-09-24 SURGERY — ROBOTIC ASSISTED LAPAROSCOPIC RADICAL PROSTATECTOMY LEVEL 2
Anesthesia: General

## 2013-09-24 MED ORDER — CIPROFLOXACIN HCL 500 MG PO TABS: 500.0000 mg | ORAL_TABLET | Freq: Two times a day (BID) | ORAL | Status: DC

## 2013-09-24 MED ORDER — MORPHINE SULFATE 2 MG/ML IJ SOLN
2.0000 mg | INTRAMUSCULAR | Status: DC | PRN
  Administered 2013-09-24: 2 mg via INTRAVENOUS
  Administered 2013-09-24: 4 mg via INTRAVENOUS
  Administered 2013-09-24 – 2013-09-25 (×4): 2 mg via INTRAVENOUS
  Filled 2013-09-24 (×2): qty 1
  Filled 2013-09-24: qty 2
  Filled 2013-09-24 (×3): qty 1

## 2013-09-24 MED ORDER — HEPARIN SODIUM (PORCINE) 1000 UNIT/ML IJ SOLN
INTRAMUSCULAR | Status: AC
  Filled 2013-09-24: qty 1

## 2013-09-24 MED ORDER — ROCURONIUM BROMIDE 100 MG/10ML IV SOLN
INTRAVENOUS | Status: DC | PRN
  Administered 2013-09-24 (×3): 10 mg via INTRAVENOUS
  Administered 2013-09-24: 30 mg via INTRAVENOUS
  Administered 2013-09-24: 20 mg via INTRAVENOUS

## 2013-09-24 MED ORDER — STERILE WATER FOR IRRIGATION IR SOLN
Status: DC | PRN
  Administered 2013-09-24: 3000 mL

## 2013-09-24 MED ORDER — LACTATED RINGERS IV SOLN
INTRAVENOUS | Status: DC
  Administered 2013-09-24: 14:00:00 via INTRAVENOUS
  Administered 2013-09-24: 1000 mL via INTRAVENOUS

## 2013-09-24 MED ORDER — SODIUM CHLORIDE 0.9 % IR SOLN
Status: DC | PRN
  Administered 2013-09-24: 300 mL via INTRAVESICAL

## 2013-09-24 MED ORDER — LABETALOL HCL 5 MG/ML IV SOLN
INTRAVENOUS | Status: AC
  Filled 2013-09-24: qty 4

## 2013-09-24 MED ORDER — PROPOFOL 10 MG/ML IV BOLUS
INTRAVENOUS | Status: AC
  Filled 2013-09-24: qty 20

## 2013-09-24 MED ORDER — CEFAZOLIN SODIUM-DEXTROSE 2-3 GM-% IV SOLR
INTRAVENOUS | Status: AC
  Filled 2013-09-24: qty 50

## 2013-09-24 MED ORDER — BUPIVACAINE-EPINEPHRINE 0.25% -1:200000 IJ SOLN
INTRAMUSCULAR | Status: DC | PRN
  Administered 2013-09-24: 30 mL

## 2013-09-24 MED ORDER — ONDANSETRON HCL 4 MG/2ML IJ SOLN
INTRAMUSCULAR | Status: DC | PRN
  Administered 2013-09-24: 4 mg via INTRAVENOUS

## 2013-09-24 MED ORDER — DEXAMETHASONE SODIUM PHOSPHATE 10 MG/ML IJ SOLN
INTRAMUSCULAR | Status: DC | PRN
  Administered 2013-09-24: 10 mg via INTRAVENOUS

## 2013-09-24 MED ORDER — GLYCOPYRROLATE 0.2 MG/ML IJ SOLN
INTRAMUSCULAR | Status: DC | PRN
  Administered 2013-09-24: 0.6 mg via INTRAVENOUS

## 2013-09-24 MED ORDER — FENTANYL CITRATE 0.05 MG/ML IJ SOLN
INTRAMUSCULAR | Status: AC
  Filled 2013-09-24: qty 2

## 2013-09-24 MED ORDER — KCL IN DEXTROSE-NACL 20-5-0.45 MEQ/L-%-% IV SOLN
INTRAVENOUS | Status: DC
  Administered 2013-09-24 – 2013-09-25 (×3): via INTRAVENOUS
  Filled 2013-09-24 (×5): qty 1000

## 2013-09-24 MED ORDER — DIPHENHYDRAMINE HCL 12.5 MG/5ML PO ELIX: 12.5000 mg | ORAL_SOLUTION | Freq: Four times a day (QID) | ORAL | Status: DC | PRN

## 2013-09-24 MED ORDER — KETOROLAC TROMETHAMINE 15 MG/ML IJ SOLN
INTRAMUSCULAR | Status: AC
  Filled 2013-09-24: qty 1

## 2013-09-24 MED ORDER — DIPHENHYDRAMINE HCL 50 MG/ML IJ SOLN: 12.5000 mg | Freq: Four times a day (QID) | INTRAMUSCULAR | Status: DC | PRN

## 2013-09-24 MED ORDER — FENTANYL CITRATE 0.05 MG/ML IJ SOLN
INTRAMUSCULAR | Status: AC
  Filled 2013-09-24: qty 5

## 2013-09-24 MED ORDER — CLONIDINE HCL 0.1 MG PO TABS
0.1000 mg | ORAL_TABLET | Freq: Three times a day (TID) | ORAL | Status: DC | PRN
  Administered 2013-09-25: 0.1 mg via ORAL
  Filled 2013-09-24 (×2): qty 1

## 2013-09-24 MED ORDER — PROMETHAZINE HCL 25 MG/ML IJ SOLN: 6.2500 mg | INTRAMUSCULAR | Status: DC | PRN

## 2013-09-24 MED ORDER — CEFAZOLIN SODIUM-DEXTROSE 2-3 GM-% IV SOLR
2.0000 g | INTRAVENOUS | Status: AC
  Administered 2013-09-24: 2 g via INTRAVENOUS

## 2013-09-24 MED ORDER — ONDANSETRON HCL 4 MG/2ML IJ SOLN
INTRAMUSCULAR | Status: AC
  Administered 2013-09-24: 4 mg via INTRAVENOUS
  Filled 2013-09-24: qty 2

## 2013-09-24 MED ORDER — LACTATED RINGERS IV SOLN
INTRAVENOUS | Status: DC
  Administered 2013-09-24: 16:00:00 via INTRAVENOUS

## 2013-09-24 MED ORDER — ONDANSETRON HCL 4 MG/2ML IJ SOLN
4.0000 mg | Freq: Four times a day (QID) | INTRAMUSCULAR | Status: DC | PRN
  Administered 2013-09-24: 4 mg via INTRAVENOUS

## 2013-09-24 MED ORDER — DOCUSATE SODIUM 100 MG PO CAPS: 100.0000 mg | ORAL_CAPSULE | Freq: Two times a day (BID) | ORAL | Status: DC

## 2013-09-24 MED ORDER — MIDAZOLAM HCL 2 MG/2ML IJ SOLN
INTRAMUSCULAR | Status: AC
  Filled 2013-09-24: qty 2

## 2013-09-24 MED ORDER — GLYCOPYRROLATE 0.2 MG/ML IJ SOLN
INTRAMUSCULAR | Status: AC
  Filled 2013-09-24: qty 3

## 2013-09-24 MED ORDER — CEFAZOLIN SODIUM 1-5 GM-% IV SOLN
1.0000 g | Freq: Three times a day (TID) | INTRAVENOUS | Status: AC
  Administered 2013-09-24 – 2013-09-25 (×2): 1 g via INTRAVENOUS
  Filled 2013-09-24 (×2): qty 50

## 2013-09-24 MED ORDER — BUPIVACAINE-EPINEPHRINE (PF) 0.25% -1:200000 IJ SOLN
INTRAMUSCULAR | Status: AC
  Filled 2013-09-24: qty 30

## 2013-09-24 MED ORDER — MEPERIDINE HCL 50 MG/ML IJ SOLN: 6.2500 mg | INTRAMUSCULAR | Status: DC | PRN

## 2013-09-24 MED ORDER — LABETALOL HCL 5 MG/ML IV SOLN
INTRAVENOUS | Status: DC | PRN
  Administered 2013-09-24 (×4): 5 mg via INTRAVENOUS

## 2013-09-24 MED ORDER — HYDROMORPHONE HCL PF 1 MG/ML IJ SOLN
0.2500 mg | INTRAMUSCULAR | Status: DC | PRN
  Administered 2013-09-24 (×3): 0.5 mg via INTRAVENOUS

## 2013-09-24 MED ORDER — SODIUM CHLORIDE 0.9 % IV BOLUS (SEPSIS)
1000.0000 mL | Freq: Once | INTRAVENOUS | Status: AC
  Administered 2013-09-24: 1000 mL via INTRAVENOUS

## 2013-09-24 MED ORDER — LORAZEPAM 2 MG/ML IJ SOLN
0.5000 mg | Freq: Two times a day (BID) | INTRAMUSCULAR | Status: DC
  Administered 2013-09-24 – 2013-09-25 (×3): 0.5 mg via INTRAVENOUS
  Filled 2013-09-24 (×4): qty 1

## 2013-09-24 MED ORDER — ONDANSETRON HCL 4 MG/2ML IJ SOLN
INTRAMUSCULAR | Status: AC
  Filled 2013-09-24: qty 2

## 2013-09-24 MED ORDER — DOCUSATE SODIUM 100 MG PO CAPS
100.0000 mg | ORAL_CAPSULE | Freq: Two times a day (BID) | ORAL | Status: DC
  Administered 2013-09-24 – 2013-09-26 (×4): 100 mg via ORAL
  Filled 2013-09-24 (×5): qty 1

## 2013-09-24 MED ORDER — SUCCINYLCHOLINE CHLORIDE 20 MG/ML IJ SOLN
INTRAMUSCULAR | Status: DC | PRN
  Administered 2013-09-24: 100 mg via INTRAVENOUS

## 2013-09-24 MED ORDER — DEXAMETHASONE SODIUM PHOSPHATE 10 MG/ML IJ SOLN
INTRAMUSCULAR | Status: AC
  Filled 2013-09-24: qty 1

## 2013-09-24 MED ORDER — NEOSTIGMINE METHYLSULFATE 10 MG/10ML IV SOLN
INTRAVENOUS | Status: DC | PRN
  Administered 2013-09-24: 4 mg via INTRAVENOUS

## 2013-09-24 MED ORDER — HYDROCODONE-ACETAMINOPHEN 5-325 MG PO TABS
1.0000 | ORAL_TABLET | Freq: Four times a day (QID) | ORAL | Status: DC | PRN
Start: 2013-09-24 — End: 2015-12-01

## 2013-09-24 MED ORDER — FENTANYL CITRATE 0.05 MG/ML IJ SOLN
INTRAMUSCULAR | Status: DC | PRN
  Administered 2013-09-24: 50 ug via INTRAVENOUS
  Administered 2013-09-24: 100 ug via INTRAVENOUS
  Administered 2013-09-24 (×6): 50 ug via INTRAVENOUS
  Administered 2013-09-24: 100 ug via INTRAVENOUS
  Administered 2013-09-24: 50 ug via INTRAVENOUS

## 2013-09-24 MED ORDER — ROCURONIUM BROMIDE 100 MG/10ML IV SOLN
INTRAVENOUS | Status: AC
  Filled 2013-09-24: qty 1

## 2013-09-24 MED ORDER — HYDROMORPHONE HCL PF 1 MG/ML IJ SOLN
INTRAMUSCULAR | Status: AC
  Filled 2013-09-24: qty 1

## 2013-09-24 MED ORDER — MIDAZOLAM HCL 5 MG/5ML IJ SOLN
INTRAMUSCULAR | Status: DC | PRN
  Administered 2013-09-24: 2 mg via INTRAVENOUS

## 2013-09-24 MED ORDER — ACETAMINOPHEN 325 MG PO TABS
650.0000 mg | ORAL_TABLET | ORAL | Status: DC | PRN
  Administered 2013-09-25: 650 mg via ORAL
  Filled 2013-09-24: qty 2

## 2013-09-24 MED ORDER — PROPOFOL 10 MG/ML IV BOLUS
INTRAVENOUS | Status: DC | PRN
  Administered 2013-09-24: 150 mg via INTRAVENOUS

## 2013-09-24 MED ORDER — KETOROLAC TROMETHAMINE 15 MG/ML IJ SOLN
15.0000 mg | Freq: Four times a day (QID) | INTRAMUSCULAR | Status: DC
  Administered 2013-09-24 – 2013-09-25 (×4): 15 mg via INTRAVENOUS
  Filled 2013-09-24 (×5): qty 1

## 2013-09-24 MED ORDER — LACTATED RINGERS IV SOLN
INTRAVENOUS | Status: DC | PRN
  Administered 2013-09-24: 12:00:00

## 2013-09-24 SURGICAL SUPPLY — 44 items
ADH SKN CLS APL DERMABOND .7 (GAUZE/BANDAGES/DRESSINGS) ×2
CABLE HIGH FREQUENCY MONO STRZ (ELECTRODE) ×3 IMPLANT
CATH FOLEY 2WAY SLVR 18FR 30CC (CATHETERS) ×3 IMPLANT
CATH ROBINSON RED A/P 16FR (CATHETERS) ×3 IMPLANT
CATH ROBINSON RED A/P 8FR (CATHETERS) ×3 IMPLANT
CATH TIEMANN FOLEY 18FR 5CC (CATHETERS) ×3 IMPLANT
CHLORAPREP W/TINT 26ML (MISCELLANEOUS) ×3 IMPLANT
CLIP LIGATING HEM O LOK PURPLE (MISCELLANEOUS) ×6 IMPLANT
CLOTH BEACON ORANGE TIMEOUT ST (SAFETY) ×3 IMPLANT
COVER SURGICAL LIGHT HANDLE (MISCELLANEOUS) ×3 IMPLANT
COVER TIP SHEARS 8 DVNC (MISCELLANEOUS) ×2 IMPLANT
COVER TIP SHEARS 8MM DA VINCI (MISCELLANEOUS) ×1
CUTTER ECHEON FLEX ENDO 45 340 (ENDOMECHANICALS) ×3 IMPLANT
DECANTER SPIKE VIAL GLASS SM (MISCELLANEOUS) ×2 IMPLANT
DERMABOND ADVANCED (GAUZE/BANDAGES/DRESSINGS) ×1
DERMABOND ADVANCED .7 DNX12 (GAUZE/BANDAGES/DRESSINGS) IMPLANT
DRAPE SURG IRRIG POUCH 19X23 (DRAPES) ×3 IMPLANT
DRSG TEGADERM 4X4.75 (GAUZE/BANDAGES/DRESSINGS) ×3 IMPLANT
DRSG TEGADERM 6X8 (GAUZE/BANDAGES/DRESSINGS) ×6 IMPLANT
ELECT REM PT RETURN 9FT ADLT (ELECTROSURGICAL) ×3
ELECTRODE REM PT RTRN 9FT ADLT (ELECTROSURGICAL) ×2 IMPLANT
GLOVE BIO SURGEON STRL SZ 6.5 (GLOVE) ×3 IMPLANT
GLOVE BIOGEL M STRL SZ7.5 (GLOVE) ×6 IMPLANT
GOWN STRL REUS W/TWL LRG LVL3 (GOWN DISPOSABLE) ×9 IMPLANT
HOLDER FOLEY CATH W/STRAP (MISCELLANEOUS) ×3 IMPLANT
IV LACTATED RINGERS 1000ML (IV SOLUTION) ×2 IMPLANT
KIT ACCESSORY DA VINCI DISP (KITS) ×1
KIT ACCESSORY DVNC DISP (KITS) ×2 IMPLANT
MANIFOLD NEPTUNE II (INSTRUMENTS) ×3 IMPLANT
NDL SAFETY ECLIPSE 18X1.5 (NEEDLE) ×2 IMPLANT
NEEDLE HYPO 18GX1.5 SHARP (NEEDLE) ×3
PACK ROBOT UROLOGY CUSTOM (CUSTOM PROCEDURE TRAY) ×3 IMPLANT
RELOAD GREEN ECHELON 45 (STAPLE) ×3 IMPLANT
SET TUBE IRRIG SUCTION NO TIP (IRRIGATION / IRRIGATOR) ×3 IMPLANT
SOLUTION ELECTROLUBE (MISCELLANEOUS) ×3 IMPLANT
SUT ETHILON 3 0 PS 1 (SUTURE) ×3 IMPLANT
SUT MNCRL AB 4-0 PS2 18 (SUTURE) ×6 IMPLANT
SUT VIC AB 3-0 SH 27 (SUTURE) ×3
SUT VIC AB 3-0 SH 27X BRD (SUTURE) IMPLANT
SUT VICRYL 0 UR6 27IN ABS (SUTURE) ×6 IMPLANT
SYRINGE 1CC 27X.5 TB SAFETYGLD (MISCELLANEOUS) ×3 IMPLANT
TOWEL OR 17X26 10 PK STRL BLUE (TOWEL DISPOSABLE) ×3 IMPLANT
TOWEL OR NON WOVEN STRL DISP B (DISPOSABLE) ×3 IMPLANT
WATER STERILE IRR 1500ML POUR (IV SOLUTION) ×4 IMPLANT

## 2013-09-24 NOTE — Discharge Instructions (Signed)

## 2013-09-24 NOTE — Anesthesia Preprocedure Evaluation (Signed)
Anesthesia Evaluation  Patient identified by MRN, date of birth, ID band Patient awake    Reviewed: Allergy & Precautions, H&P , NPO status , Patient's Chart, lab work & pertinent test results  Airway Mallampati: II TM Distance: >3 FB Neck ROM: Full    Dental no notable dental hx. (+) Poor Dentition   Pulmonary asthma , Current Smoker,  breath sounds clear to auscultation  Pulmonary exam normal       Cardiovascular hypertension, Pt. on medications Rhythm:Regular Rate:Normal     Neuro/Psych negative neurological ROS  negative psych ROS   GI/Hepatic negative GI ROS, Neg liver ROS,   Endo/Other  negative endocrine ROS  Renal/GU negative Renal ROS  negative genitourinary   Musculoskeletal negative musculoskeletal ROS (+)   Abdominal   Peds negative pediatric ROS (+)  Hematology negative hematology ROS (+)   Anesthesia Other Findings   Reproductive/Obstetrics negative OB ROS                           Anesthesia Physical Anesthesia Plan  ASA: II  Anesthesia Plan: General   Post-op Pain Management:    Induction: Intravenous  Airway Management Planned: Oral ETT  Additional Equipment:   Intra-op Plan:   Post-operative Plan: Extubation in OR  Informed Consent: I have reviewed the patients History and Physical, chart, labs and discussed the procedure including the risks, benefits and alternatives for the proposed anesthesia with the patient or authorized representative who has indicated his/her understanding and acceptance.   Dental advisory given  Plan Discussed with: CRNA  Anesthesia Plan Comments:         Anesthesia Quick Evaluation

## 2013-09-24 NOTE — Op Note (Signed)
Preoperative diagnosis: Clinically localized adenocarcinoma of the prostate (clinical stage T2b N0 M0)  Postoperative diagnosis: Clinically localized adenocarcinoma of the prostate (clinical stage T2b N0 M0)  Procedure:  1. Robotic assisted laparoscopic radical prostatectomy  2. Bilateral robotic assisted laparoscopic pelvic lymphadenectomy  Surgeon: Pryor Curia. M.D.  Assistant(s): Dr. Park Liter  Anesthesia: General  Complications: None  EBL: 150 mL  IVF:  2000 mL crystalloid  Specimens: 1. Prostate and seminal vesicles 2. Right pelvic lymph nodes 3. Left pelvic lymph nodes  Disposition of specimens: Pathology  Drains: 1. 20 Fr coude catheter 2. # 19 Blake pelvic drain  Indication: Leonard [name] is a 63 y.o. year old patient with clinically localized prostate cancer.  After a thorough review of the management options for treatment of prostate cancer, he elected to proceed with surgical therapy and the above procedure(s).  We have discussed the potential benefits and risks of the procedure, side effects of the proposed treatment, the likelihood of the patient achieving the goals of the procedure, and any potential problems that might occur during the procedure or recuperation. Informed consent has been obtained.  Description of procedure:  The patient was taken to the operating room and a general anesthetic was administered. He was given preoperative antibiotics, placed in the dorsal lithotomy position, and prepped and draped in the usual sterile fashion. Next a preoperative timeout was performed. A urethral catheter was placed into the bladder and a site was selected near the umbilicus for placement of the camera port. This was placed using a standard open Hassan technique which allowed entry into the peritoneal cavity under direct vision and without difficulty. A 12 mm port was placed and a pneumoperitoneum established. The camera was then used to inspect the  abdomen and there was no evidence of any intra-abdominal injuries or other abnormalities. The remaining abdominal ports were then placed. 8 mm robotic ports were placed in the right lower quadrant, left lower quadrant, and far left lateral abdominal wall. A 5 mm port was placed in the right upper quadrant and a 12 mm port was placed in the right lateral abdominal wall for laparoscopic assistance. All ports were placed under direct vision without difficulty. The surgical cart was then docked.   Utilizing the cautery scissors, the bladder was reflected posteriorly allowing entry into the space of Retzius and identification of the endopelvic fascia and prostate. The periprostatic fat was then removed from the prostate allowing full exposure of the endopelvic fascia. The endopelvic fascia was then incised from the apex back to the base of the prostate bilaterally and the underlying levator muscle fibers were swept laterally off the prostate thereby isolating the dorsal venous complex. The dorsal vein was then stapled and divided with a 45 mm Flex Echelon stapler. Attention then turned to the bladder neck which was divided anteriorly thereby allowing entry into the bladder and exposure of the urethral catheter. The catheter balloon was deflated and the catheter was brought into the operative field and used to retract the prostate anteriorly. The posterior bladder neck was then examined and was divided allowing further dissection between the bladder and prostate posteriorly until the vasa deferentia and seminal vessels were identified. During this dissection, the posterior bladder neck was noted to be thin with no cystotomy noted but there appeared to be dissection down to mucosa. The vasa deferentia were isolated, divided, and lifted anteriorly. The seminal vesicles were dissected down to their tips with care to control the seminal vascular arterial blood  supply. These structures were then lifted anteriorly and the  space between Denonvillier's fascia and the anterior rectum was developed with a combination of sharp and blunt dissection. This isolated the vascular pedicles of the prostate.  Weck clips were used to ligate the vascular pedicles of the prostate bilaterally. The vascular pedicles of the prostate were then divided.  The urethra was then sharply transected allowing the prostate specimen to be disarticulated. The pelvis was copiously irrigated and hemostasis was ensured. There was no evidence for rectal injury.  Attention then turned to the right pelvic sidewall. The fibrofatty tissue between the external iliac vein, confluence of the iliac vessels, hypogastric artery, and Cooper's ligament was dissected free from the pelvic sidewall with care to preserve the obturator nerve. Weck clips were used for lymphostasis and hemostasis. An identical procedure was performed on the contralateral side and the lymphatic packets were removed for permanent pathologic analysis.  The bladder was then examined.  The previously noted thin portion of the posterior bladder neck was examined.  There was no cystotomy.  A 3-0 vicryl suture was used to imbricate the detrusor muscle over this area to reinforce the posterior bladder.  In addition, the ureteral orifices were identified and were effluxing clear urine well away from the repair.  Attention then turned to the urethral anastomosis. A 2-0 Vicryl slip knot was placed between Denonvillier's fascia, the posterior bladder neck, and the posterior urethra to reapproximate these structures. A double-armed 3-0 Monocryl suture was then used to perform a 360 running tension-free anastomosis between the bladder neck and urethra. A new urethral catheter was then placed into the bladder and irrigated. There were no blood clots within the bladder and the anastomosis appeared to be watertight. A #19 Blake drain was then brought through the left lateral 8 mm port site and positioned  appropriately within the pelvis. It was secured to the skin with a nylon suture. The surgical cart was then undocked. The right lateral 12 mm port site was closed at the fascial level with a 0 Vicryl suture placed laparoscopically. All remaining ports were then removed under direct vision. The prostate specimen was removed intact within the Endopouch retrieval bag via the periumbilical camera port site. This fascial opening was closed with two running 0 Vicryl sutures. 0.25% Marcaine was then injected into all port sites and all incisions were reapproximated at the skin level with 3-0 Monocryl subcuticular sutures. Dermabond was applied to the skin. The patient appeared to tolerate the procedure well and without complications. The patient was able to be extubated and transferred to the recovery unit in satisfactory condition.  Pryor Curia MD

## 2013-09-24 NOTE — Anesthesia Postprocedure Evaluation (Signed)
  Anesthesia Post-op Note  Patient: Leonard [name]  Procedure(s) Performed: Procedure(s) (LRB): ROBOTIC ASSISTED LAPAROSCOPIC RADICAL PROSTATECTOMY LEVEL 2 (N/A) LYMPHADENECTOMY (Bilateral)  Patient Location: PACU  Anesthesia Type: General  Level of Consciousness: awake and alert   Airway and Oxygen Therapy: Patient Spontanous Breathing  Post-op Pain: mild  Post-op Assessment: Post-op Vital signs reviewed, Patient's Cardiovascular Status Stable, Respiratory Function Stable, Patent Airway and No signs of Nausea or vomiting  Last Vitals:  Filed Vitals:   09/24/13 1605  BP: 203/89  Pulse: 74  Temp: 36.4 C  Resp: 18    Post-op Vital Signs: stable   Complications: No apparent anesthesia complications

## 2013-09-24 NOTE — Transfer of Care (Signed)
Immediate Anesthesia Transfer of Care Note  Patient: Leonard [name]  Procedure(s) Performed: Procedure(s): ROBOTIC ASSISTED LAPAROSCOPIC RADICAL PROSTATECTOMY LEVEL 2 (N/A) LYMPHADENECTOMY (Bilateral)  Patient Location: PACU  Anesthesia Type:General  Level of Consciousness: awake, alert , oriented and patient cooperative  Airway & Oxygen Therapy: Patient Spontanous Breathing and Patient connected to face mask oxygen  Post-op Assessment: Report given to PACU RN and Post -op Vital signs reviewed and stable  Post vital signs: Reviewed and stable  Complications: No apparent anesthesia complications

## 2013-09-24 NOTE — Progress Notes (Signed)
Patient ID: Leonard [name], male   DOB: 06-02-1950, 63 y.o.   MRN: [id number]  Post-op note  Subjective: The patient is doing well.  No complaints.  Objective: Vital signs in last 24 hours: Temp:  [97.4 F (36.3 C)-97.8 F (36.6 C)] 97.6 F (36.4 C) (08/17 1605) Pulse Rate:  [74-88] 74 (08/17 1605) Resp:  [16-22] 18 (08/17 1605) BP: (179-203)/(77-109) 203/89 mmHg (08/17 1605) SpO2:  [97 %-98 %] 97 % (08/17 1605) Weight:  [83.28 kg (183 lb 9.6 oz)] 83.28 kg (183 lb 9.6 oz) (08/17 0949)  Intake/Output from previous day:   Intake/Output this shift: Total I/O In: 1700 [I.V.:1700] Out: 340 [Urine:140; Drains:50; Blood:150]  Physical Exam:  General: Alert and oriented. Abdomen: Soft, Nondistended. Incisions: Clean and dry.  Lab Results:  Recent Labs  09/24/13 1500  HGB 14.5  HCT 40.9    Assessment/Plan: POD#0   1) Continue to monitor, ambulate, clonidine prn for hypertension   Pryor Curia. MD   LOS: 0 days   Ashyla Luth,LES 09/24/2013, 5:48 PM

## 2013-09-25 ENCOUNTER — Encounter (HOSPITAL_COMMUNITY): Payer: Self-pay | Admitting: *Deleted

## 2013-09-25 LAB — HEMOGLOBIN AND HEMATOCRIT, BLOOD
HCT: 39.1 % (ref 39.0–52.0)
Hemoglobin: 13.2 g/dL (ref 13.0–17.0)

## 2013-09-25 LAB — BASIC METABOLIC PANEL WITH GFR
Anion gap: 14 (ref 5–15)
BUN: 16 mg/dL (ref 6–23)
CO2: 22 meq/L (ref 19–32)
Calcium: 8.8 mg/dL (ref 8.4–10.5)
Chloride: 100 meq/L (ref 96–112)
Creatinine, Ser: 1.47 mg/dL — ABNORMAL HIGH (ref 0.50–1.35)
GFR calc Af Amer: 57 mL/min — ABNORMAL LOW
GFR calc non Af Amer: 49 mL/min — ABNORMAL LOW
Glucose, Bld: 116 mg/dL — ABNORMAL HIGH (ref 70–99)
Potassium: 4.7 meq/L (ref 3.7–5.3)
Sodium: 136 meq/L — ABNORMAL LOW (ref 137–147)

## 2013-09-25 LAB — CREATININE, FLUID (PLEURAL, PERITONEAL, JP DRAINAGE): Creat, Fluid: 1.6 mg/dL

## 2013-09-25 MED ORDER — NICOTINE 14 MG/24HR TD PT24
14.0000 mg | MEDICATED_PATCH | Freq: Every day | TRANSDERMAL | Status: DC
  Administered 2013-09-25 – 2013-09-26 (×2): 14 mg via TRANSDERMAL
  Filled 2013-09-25 (×2): qty 1

## 2013-09-25 MED ORDER — BELLADONNA ALKALOIDS-OPIUM 16.2-60 MG RE SUPP
1.0000 | Freq: Four times a day (QID) | RECTAL | Status: DC | PRN
  Administered 2013-09-25 – 2013-09-26 (×3): 1 via RECTAL
  Filled 2013-09-25 (×3): qty 1

## 2013-09-25 MED ORDER — HYDROCODONE-ACETAMINOPHEN 5-325 MG PO TABS
1.0000 | ORAL_TABLET | Freq: Four times a day (QID) | ORAL | Status: DC | PRN
  Administered 2013-09-25 – 2013-09-26 (×4): 2 via ORAL
  Filled 2013-09-25 (×5): qty 2

## 2013-09-25 MED ORDER — BISACODYL 10 MG RE SUPP
10.0000 mg | Freq: Once | RECTAL | Status: AC
  Administered 2013-09-25: 10 mg via RECTAL
  Filled 2013-09-25: qty 1

## 2013-09-25 NOTE — Progress Notes (Signed)
Patient ID: Leonard [name], male   DOB: [birth year], 63 y.o.   MRN: [id number]  1 Day Post-Op Subjective: The patient is doing well.  No nausea or vomiting. Pain is adequately controlled.  Objective: Vital signs in last 24 hours: Temp:  [97.4 F (36.3 C)-99.8 F (37.7 C)] 99.2 F (37.3 C) (08/18 0514) Pulse Rate:  [74-88] 79 (08/18 0514) Resp:  [16-22] 16 (08/18 0514) BP: (175-203)/(72-109) 175/72 mmHg (08/18 0514) SpO2:  [95 %-100 %] 98 % (08/18 0514) Weight:  [83.28 kg (183 lb 9.6 oz)] 83.28 kg (183 lb 9.6 oz) (08/17 0949)  Intake/Output from previous day: 08/17 0701 - 08/18 0700 In: 2797.5 [P.O.:720; I.V.:2027.5; IV Piggyback:50] Out: 2720 [Urine:2065; Drains:505; Blood:150] Intake/Output this shift:    Physical Exam:  General: Alert and oriented. CV: RRR Lungs: Clear bilaterally. GI: Soft, Nondistended. Incisions: Clean, dry, and intact Urine: Light pink Extremities: Nontender, no erythema, no edema.  Lab Results:  Recent Labs  09/24/13 1500 09/25/13 0505  HGB 14.5 13.2  HCT 40.9 39.1      Assessment/Plan: POD# 1 s/p robotic prostatectomy.  1) SL IVF 2) Ambulate, Incentive spirometry 3) Transition to oral pain medication 4) Dulcolax suppository 5) Check drain creatinine level and if c/w serum, d/c drain later 6) Plan for likely discharge later today   Pryor Curia. MD   LOS: 1 day   Kymir Coles,LES 09/25/2013, 7:28 AM

## 2013-09-25 NOTE — Progress Notes (Signed)
Patient ID: Leonard [name], male   DOB: 1950/04/13, 63 y.o.   MRN: [id number]  1 Day Post-Op Subjective: Pt with increased abdominal pain earlier today.  Now improved after BM.   Objective: Vital signs in last 24 hours: Temp:  [97.5 F (36.4 C)-99.8 F (37.7 C)] 97.9 F (36.6 C) (08/18 1357) Pulse Rate:  [72-82] 72 (08/18 1357) Resp:  [16-20] 20 (08/18 1357) BP: (136-197)/(72-117) 157/74 mmHg (08/18 1357) SpO2:  [95 %-100 %] 95 % (08/18 1357)  Intake/Output from previous day: 08/17 0701 - 08/18 0700 In: 2797.5 [P.O.:720; I.V.:2027.5; IV Piggyback:50] Out: 2720 [Urine:2065; Drains:505; Blood:150] Intake/Output this shift: Total I/O In: 0865 [P.O.:600; I.V.:1140] Out: 1215 [Urine:850; Drains:365]  Physical Exam:  General: Alert and oriented Abdomen: Soft, ND, positive BS Incisions: C/D/I Ext: NT, No erythema  Lab Results:  Recent Labs  09/24/13 1500 09/25/13 0505  HGB 14.5 13.2  HCT 40.9 39.1   BMET  Recent Labs  09/25/13 1317  NA 136*  K 4.7  CL 100  CO2 22  GLUCOSE 116*  BUN 16  CREATININE 1.47*  CALCIUM 8.8     Studies/Results: Drain Cr 1.6  Assessment/Plan: - D/C drain (Cr c/w serum) - Pt is concerned about pain control and will remain overnight with plans for d/c in morning   LOS: 1 day   Leonard [name],LES 09/25/2013, 5:39 PM

## 2013-09-25 NOTE — Progress Notes (Signed)
1 Day Post-Op Subjective: Pt reports pain is 'OK". Wife reports pt is in excrutiating pain.  Ambulating without difficulty. Tolerating clears without nausea. No flatus yet.  Objective: Vital signs in last 24 hours: Temp:  [97.4 F (36.3 C)-99.8 F (37.7 C)] 97.9 F (36.6 C) (08/18 0947) Pulse Rate:  [74-88] 78 (08/18 0947) Resp:  [16-22] 18 (08/18 0947) BP: (136-203)/(72-117) 136/117 mmHg (08/18 0947) SpO2:  [95 %-100 %] 97 % (08/18 0947)  Intake/Output from previous day: 08/17 0701 - 08/18 0700 In: 2797.5 [P.O.:720; I.V.:2027.5; IV Piggyback:50] Out: 2720 [Urine:2065; Drains:505; Blood:150] Intake/Output this shift: Total I/O In: 840 [P.O.:240; I.V.:600] Out: 145 [Drains:145]  Physical Exam:  General: Alert and oriented CV: RRR Lungs: Clear Abdomen: Soft, ND, appropriately tender. Incisions: CDI. Foley clear yellow. JP pink tinged serous. Ext: NT, No erythema  Lab Results:  Recent Labs  09/24/13 1500 09/25/13 0505  HGB 14.5 13.2  HCT 40.9 39.1   BMET No results found for this basename: NA, K, CL, CO2, GLUCOSE, BUN, CREATININE, CALCIUM,  in the last 72 hours   Studies/Results: No results found.  Assessment/Plan: POD#1 from RALP with bPLND. Overnight with high JP output, but JP cre 1.6 (preop serum cre 1.1) likely indicative of left-over intraabdominal urine spillage during case, not ongoing leak.  Pt stating he would like an additional day to try to improve pain control.  Discussed with Dr. Alinda Money.   LOS: 1 day   Margo Aye 09/25/2013, 12:14 PM

## 2013-09-25 NOTE — Progress Notes (Signed)
Received report from [facility]. I agree with previous assessment. Will continue to monitor closely.

## 2013-09-26 ENCOUNTER — Inpatient Hospital Stay (HOSPITAL_COMMUNITY): Payer: No Typology Code available for payment source

## 2013-09-26 MED ORDER — BISACODYL 10 MG RE SUPP
10.0000 mg | Freq: Once | RECTAL | Status: AC
  Administered 2013-09-26: 10 mg via RECTAL
  Filled 2013-09-26: qty 1

## 2013-09-26 MED ORDER — KCL IN DEXTROSE-NACL 20-5-0.45 MEQ/L-%-% IV SOLN
INTRAVENOUS | Status: DC
  Administered 2013-09-26: 13:00:00 via INTRAVENOUS
  Filled 2013-09-26 (×2): qty 1000

## 2013-09-26 MED ORDER — KETOROLAC TROMETHAMINE 15 MG/ML IJ SOLN
15.0000 mg | Freq: Once | INTRAMUSCULAR | Status: AC
  Administered 2013-09-26: 15 mg via INTRAVENOUS
  Filled 2013-09-26: qty 1

## 2013-09-26 MED ORDER — FLEET ENEMA 7-19 GM/118ML RE ENEM
1.0000 | ENEMA | Freq: Once | RECTAL | Status: AC
  Administered 2013-09-26: 1 via RECTAL
  Filled 2013-09-26: qty 1

## 2013-09-26 NOTE — Progress Notes (Signed)
Patient ID: Leonard [name], male   DOB: 12/12/1950, 63 y.o.   MRN: [id number]  2 Days Post-Op Subjective: Pt had a decent night although has continued to take frequent pain medication for abdominal pain.  He complains of moderate to severe abdominal crampy pain this morning. No nausea or vomiting.  He has had two bowel movements in last 24 hours.  Objective: Vital signs in last 24 hours: Temp:  [97.9 F (36.6 C)-98.9 F (37.2 C)] 98.4 F (36.9 C) (08/19 0443) Pulse Rate:  [72-83] 82 (08/19 0443) Resp:  [18-20] 19 (08/19 0443) BP: (136-157)/(74-117) 143/78 mmHg (08/19 0443) SpO2:  [94 %-97 %] 97 % (08/19 0443)  Intake/Output from previous day: 08/18 0701 - 08/19 0700 In: 2340 [P.O.:1200; I.V.:1140] Out: 3000 [Urine:2575; Drains:425] Intake/Output this shift: Total I/O In: 360 [P.O.:360] Out: 1125 [Urine:1125]  Physical Exam:  General: Alert and oriented CV: RRR Lungs: Clear Abdomen: Soft, Moderately distended, Minimal BS Incisions: C/D/I GU: Urine mostly clear Ext: NT, No erythema  Lab Results:  Recent Labs  09/24/13 1500 09/25/13 0505  HGB 14.5 13.2  HCT 40.9 39.1   BMET  Recent Labs  09/25/13 1317  NA 136*  K 4.7  CL 100  CO2 22  GLUCOSE 116*  BUN 16  CREATININE 1.47*  CALCIUM 8.8    Assessment/Plan: 1) Abdominal pain:  He appears to have symptoms consistent with ileus likely related to increased narcotic intake.  Dulcolax suppository and encouraged more ambulation. 2) Disp: Will reassess later this morning to see if he is stable for discharge at that time.  If not, will proceed with abdominal plain films and laboratory studies for further evaluation.   LOS: 2 days   Kenndra Morris,LES 09/26/2013, 6:50 AM

## 2013-09-26 NOTE — Discharge Summary (Signed)
  Date of admission: 09/24/2013  Date of discharge: 09/26/2013  Admission diagnosis: Prostate Cancer  Discharge diagnosis: Prostate Cancer  History and Physical: For full details, please see admission history and physical. Briefly, Leonard [name] is a 63 y.o. gentleman with localized prostate cancer.  After discussing management/treatment options, he elected to proceed with surgical treatment.  Hospital Course: Leonard [name] was taken to the operating room on 09/24/2013 and underwent a robotic assisted laparoscopic radical prostatectomy. He tolerated this procedure well and without complications. Postoperatively, he was able to be transferred to a regular hospital room following recovery from anesthesia.  He was able to begin ambulating the night of surgery. He remained hemodynamically stable overnight.  He had excellent urine output with appropriately minimal output from his pelvic drain and his pelvic drain was removed on POD #1.  He was transitioned to oral pain medication, tolerated a clear liquid diet initially but developed severe abdominal pain on the afternoon of POD#1.  This persistent on POD#2 and an acute abdominal series was performed which demonstrated a mild ileus.  His AAS also raised a question of a right colon mass.  His prior CT did not show any obvious mass.  I discussed this with Leonard [name] and recommended he schedule a screening colonoscopy through his PCP as he has never had one before.   He expressed his understanding and agreed to schedule this upon returning home.  He received an enema after not responding to a suppository earlier in the day.  He had multiple bowel movements and his pain was much improved and he was felt stable for discharge home.  Laboratory values:  Recent Labs  09/24/13 1500 09/25/13 0505  HGB 14.5 13.2  HCT 40.9 39.1    Disposition: Home  Discharge instruction: He was instructed to be ambulatory but to refrain from heavy lifting, strenuous activity,  or driving. He was instructed on urethral catheter care.  Discharge medications:     Medication List         ciprofloxacin 500 MG tablet  Commonly known as:  CIPRO  Take 1 tablet (500 mg total) by mouth 2 (two) times daily. Begin one day prior to return office visit for catheter removal.     docusate sodium 100 MG capsule  Commonly known as:  COLACE  Take 1 capsule (100 mg total) by mouth 2 (two) times daily.     HYDROcodone-acetaminophen 5-325 MG per tablet  Commonly known as:  NORCO/VICODIN  Take 1-2 tablets by mouth every 6 (six) hours as needed.        Followup: He will followup in 1 week for catheter removal and to discuss his surgical pathology results.

## 2013-09-26 NOTE — Progress Notes (Signed)
Patient ID: Leonard [name], male   DOB: 1950-10-18, 63 y.o.   MRN: [id number]  Pt improved after enema and multiple bowel movements/flatus.  Abd x-rays from earlier consistent with ileus.  Some concern raised about possible right colon mass.  A CT scan was recommended.  Patient had a CT scan in July without oral contrast but no obvious mass noted.  I discussed this finding with the patient and recommended he schedule routine screening colonoscopy through his PCP after he recovers from surgery considering that he has never undergone a colonscopy.  Abd: soft and ND   Plan: D/C home

## 2013-09-26 NOTE — Progress Notes (Signed)
Patient stated that he went to robotic class and understood leg bag teaching. Leg bag teaching gone over once again with patient and significant. Both are able to verbalize and demonstrate understanding. Setzer, Marchelle Folks

## 2013-09-26 NOTE — Progress Notes (Signed)
Patient having 10/10 pain in abdomen. Patient has been ambulating in hallway this am and pain medication and dulcolax given. MD called and orders for abdominal xray, toradol, and fleet enema received. Will continue to monitor patient. Setzer, Marchelle Folks

## 2015-11-19 DIAGNOSIS — N432 Other hydrocele: Secondary | ICD-10-CM | POA: Diagnosis not present

## 2015-11-19 DIAGNOSIS — C61 Malignant neoplasm of prostate: Secondary | ICD-10-CM | POA: Diagnosis not present

## 2015-11-24 DIAGNOSIS — C61 Malignant neoplasm of prostate: Secondary | ICD-10-CM | POA: Diagnosis not present

## 2015-11-26 DIAGNOSIS — N432 Other hydrocele: Secondary | ICD-10-CM | POA: Diagnosis not present

## 2015-11-26 DIAGNOSIS — C61 Malignant neoplasm of prostate: Secondary | ICD-10-CM | POA: Diagnosis not present

## 2015-11-26 DIAGNOSIS — R3121 Asymptomatic microscopic hematuria: Secondary | ICD-10-CM | POA: Diagnosis not present

## 2015-11-27 ENCOUNTER — Other Ambulatory Visit: Payer: Self-pay | Admitting: Urology

## 2015-11-27 NOTE — Progress Notes (Signed)
LVVM at pharmacy call center regarding patients surgical date

## 2015-11-27 NOTE — Progress Notes (Signed)
Scheduling pre op- please place SURGICAL ORDERS IN EPIC thanks

## 2015-11-28 ENCOUNTER — Encounter (HOSPITAL_COMMUNITY): Payer: Self-pay | Admitting: *Deleted

## 2015-11-28 NOTE — H&P (Signed)
Office Visit Report     11/26/2015   --------------------------------------------------------------------------------   Leonard [name]  MRN: [id number]  PRIMARY CARE:  Antonieta Pert, NP  DOB: March 29, 1950, 65 year old Male  REFERRING:    SSN: [id number]  PROVIDER:  Raynelle Bring, M.D.    LOCATION:  [facility] [phone]   --------------------------------------------------------------------------------   CC/HPI: Prostate cancer and left hydrocele    Leonard [name] returns today after last having see me in February 2016. He has been noncompliant with his prostate cancer surveillance. He was seen last week by Jiles Crocker due to left-sided scrotal swelling. It was felt that he likely has a hydrocele and he was scheduled for a scrotal ultrasound prior to her appointment today. He states that this initially occurred 3 months ago. He notes no associated trauma or other underlying factors. He has not had any pain associated with this although this is a nuisance and bothersome to him.   With regard to his prostate cancer, he continues to maintain excellent continence and has any new voiding complaints. He does continue to have erectile dysfunction although has not tried PDE 5 inhibitors and at least 18 months. He is unable to achieve erections adequate for intercourse without medication.     ALLERGIES: No Allergies    MEDICATIONS: Meloxicam 15 MG Oral Tablet 0 Oral Daily  Uribel 118 MG Oral Capsule 1 Oral Twice daily     GU PSH: Laparoscopy; Lymphadenectomy - 2015 Robotic Radical Prostatectomy - 2015      PSH Notes: Laparoscopy With Bilateral Total Pelvic Lymphadenectomy, Prostatect Retropubic Radical W/ Nerve Sparing Laparoscopic, Appendectomy   NON-GU PSH: Appendectomy - 2015    GU PMH: Other hydrocele, Left - 11/19/2015 Male ED, unspecified, Erectile dysfunction - 10/01/2014 Prostate Cancer, Prostate cancer - 10/01/2014 Low back pain, Lower back pain - 06/13/2014 Urinary  Frequency, Urinary frequency - 06/13/2014, Urine frequency, - 06/13/2014 Stress Incontinence, M/F, Male stress incontinence - 2016 Urinary Tract Inf, Unspec site, Urinary tract infection - 2015 Hydronephrosis Unspec, Hydronephrosis - 2015    NON-GU PMH: Encounter for general adult medical examination without abnormal findings, Encounter for preventive health examination - 06/13/2014 Muscle weakness (generalized), Muscle weakness - 2015 Other lack of coordination, Muscular incoordination - 2015 Personal history of other diseases of the circulatory system, History of hypertension - 2015 Personal history of other diseases of the respiratory system, History of asthma - 2015    FAMILY HISTORY: No pertinent family history - Other   SOCIAL HISTORY: Marital Status: Single Current Smoking Status: Patient smokes. Smokes 1 pack per day.  Moderate Drinker.  Drinks 2 caffeinated drinks per day.     Notes: Activities of daily living (ADL's), independent, Exercise habits, Living situation, Single, Current every day smoker, Alcohol use   REVIEW OF SYSTEMS:    GU Review Male:   Patient denies frequent urination, hard to postpone urination, burning/ pain with urination, get up at night to urinate, leakage of urine, stream starts and stops, trouble starting your streams, and have to strain to urinate .  Gastrointestinal (Upper):   Patient denies nausea and vomiting.  Gastrointestinal (Lower):   Patient denies constipation and diarrhea.  Constitutional:   Patient denies fever, night sweats, weight loss, and fatigue.  Skin:   Patient denies skin rash/ lesion and itching.  Eyes:   Patient denies blurred vision and double vision.  Ears/ Nose/ Throat:   Patient denies sore throat and sinus problems.  Hematologic/Lymphatic:   Patient denies swollen glands  and easy bruising.  Cardiovascular:   Patient denies leg swelling and chest pains.  Respiratory:   Patient denies cough and shortness of breath.  Endocrine:    Patient denies excessive thirst.  Musculoskeletal:   Patient denies back pain and joint pain.  Neurological:   Patient denies headaches and dizziness.  Psychologic:   Patient denies depression and anxiety.   VITAL SIGNS:      11/26/2015 03:11 PM  Weight 175 lb / 79.38 kg  BP 158/93 mmHg  Pulse 88 /min   GU PHYSICAL EXAMINATION:    Scrotum: The right testis is descended and palpably normal without tenderness. The left testis is not able to be palpated. There is a large left-sided hydrocele that is soft and mobile.   MULTI-SYSTEM PHYSICAL EXAMINATION:    Constitutional: Well-nourished. No physical deformities. Normally developed. Good grooming.  Neck: Neck symmetrical, not swollen. Normal tracheal position.  Respiratory: No labored breathing, no use of accessory muscles.   Cardiovascular: Normal temperature, normal extremity pulses, no swelling, no varicosities.     PAST DATA REVIEWED:  Source Of History:  Patient  X-Ray Review: Scrotal Ultrasound: Reviewed Films.     03/29/14 11/23/13 06/19/13  PSA  Total PSA 0.03  0.03  15.35    Notes:                     I independently reviewed a scrotal ultrasound. This does demonstrate a simple left hydrocele.   PROCEDURES:         Scrotal Ultrasound [id number]  Right Testicle: Length: 4.5 cm  Height: 2.0 cm  Width: 3.0 cm  Left Testicle: Length: 5.0 cm  Height: 2.4 cm  Width: 2.6 cm  Left Testis/Epididymis:  Testicle appears WNLs with blood flow. Cystic area seen in head of epi= 1.2cm, Large Hydrocele seen. No varicocele noted.  Right Testis/Epididymis:  Testicle appears WNLs with blood flow. Multiple sub- centi cystic areas seen in epi head. Small hydrocele seen. No varicocele noted.               Urinalysis w/Scope - 81001 Dipstick Dipstick Cont'd Micro  Specimen: Voided Bilirubin: Neg WBC/hpf: 0 - 5/hpf  Color: Yellow Ketones: Neg RBC/hpf: 3 - 10/hpf  Appearance: Clear Blood: 3+ Bacteria: Rare  Specific Gravity: 1.020  Protein: 3+ Cystals: NS (Not Seen)  pH: 5.5 Urobilinogen: 0.2 Casts: NS (Not Seen)  Glucose: Neg Nitrites: Neg Trichomonas: Not Present    Leukocyte Esterase: Neg Mucous: Not Present      Epithelial Cells: 0 - 5/hpf      Yeast: NS (Not Seen)      Sperm: Not Present    ASSESSMENT:      ICD-10 Details  1 GU:   Prostate Cancer - C61   2   Other hydrocele - N43.2 Left  3   Asymptomatic microscopic hematuria - R31.21    PLAN:           Orders Labs PSA          Schedule Return Visit: Other See Visit Notes             Note: Will call to schedule surgery          Document Letter(s):  Created for Patient: Clinical Summary         Notes:   1. Prostate cancer: His PSA will be checked. If no recurrence, this will be checked in 6 months.   2. Erectile dysfunction: He was provided samples for  Stendra 200 mg and instructed on proper use and potential side effects. If ineffective, we did briefly discuss more aggressive treatment options such as vacuum erection devices, intraurethral suppositories, intracavernosal injection therapy, and penile prosthesis surgery.   3. Left hydrocele: He appears to have a simple left-sided hydrocele. This is bothersome to him and he does wish to proceed with surgical treatment. We discussed this procedure in detail today including the potential risks, complications, and expected recovery process. He gives informed consent to proceed. This will be scheduled for the near future.   Cc:    * Signed by Raynelle Bring, M.D. on 11/26/15 at 5:31 PM (EDT)*

## 2015-12-01 ENCOUNTER — Ambulatory Visit (HOSPITAL_COMMUNITY)
Admission: RE | Admit: 2015-12-01 | Discharge: 2015-12-01 | Disposition: A | Discharge status: Home / Self Care | Payer: PPO | Source: Ambulatory Visit | Attending: Urology | Admitting: Urology

## 2015-12-01 ENCOUNTER — Encounter (HOSPITAL_COMMUNITY): Payer: Self-pay | Admitting: *Deleted

## 2015-12-01 ENCOUNTER — Encounter (HOSPITAL_COMMUNITY)
Admission: RE | Disposition: A | Discharge status: Home / Self Care | Payer: Self-pay | Source: Ambulatory Visit | Attending: Urology

## 2015-12-01 ENCOUNTER — Ambulatory Visit (HOSPITAL_COMMUNITY): Payer: PPO | Admitting: Anesthesiology

## 2015-12-01 DIAGNOSIS — I10 Essential (primary) hypertension: Secondary | ICD-10-CM | POA: Diagnosis not present

## 2015-12-01 DIAGNOSIS — N433 Hydrocele, unspecified: Secondary | ICD-10-CM | POA: Insufficient documentation

## 2015-12-01 DIAGNOSIS — C61 Malignant neoplasm of prostate: Secondary | ICD-10-CM | POA: Insufficient documentation

## 2015-12-01 DIAGNOSIS — F17200 Nicotine dependence, unspecified, uncomplicated: Secondary | ICD-10-CM | POA: Diagnosis not present

## 2015-12-01 DIAGNOSIS — Z79899 Other long term (current) drug therapy: Secondary | ICD-10-CM | POA: Diagnosis not present

## 2015-12-01 DIAGNOSIS — M545 Low back pain: Secondary | ICD-10-CM | POA: Diagnosis not present

## 2015-12-01 DIAGNOSIS — J45909 Unspecified asthma, uncomplicated: Secondary | ICD-10-CM | POA: Insufficient documentation

## 2015-12-01 DIAGNOSIS — R3129 Other microscopic hematuria: Secondary | ICD-10-CM | POA: Diagnosis not present

## 2015-12-01 HISTORY — PX: HYDROCELE EXCISION: SHX482

## 2015-12-01 LAB — CBC
HCT: 43.2 % (ref 39.0–52.0)
Hemoglobin: 15.3 g/dL (ref 13.0–17.0)
MCH: 33.3 pg (ref 26.0–34.0)
MCHC: 35.4 g/dL (ref 30.0–36.0)
MCV: 94.1 fL (ref 78.0–100.0)
Platelets: 207 10*3/uL (ref 150–400)
RBC: 4.59 MIL/uL (ref 4.22–5.81)
RDW: 13.3 % (ref 11.5–15.5)
WBC: 12.2 10*3/uL — ABNORMAL HIGH (ref 4.0–10.5)

## 2015-12-01 LAB — BASIC METABOLIC PANEL
Anion gap: 7 (ref 5–15)
BUN: 20 mg/dL (ref 6–20)
CO2: 23 mmol/L (ref 22–32)
Calcium: 9.4 mg/dL (ref 8.9–10.3)
Chloride: 109 mmol/L (ref 101–111)
Creatinine, Ser: 1.28 mg/dL — ABNORMAL HIGH (ref 0.61–1.24)
GFR calc Af Amer: >60 mL/min (ref >60)
GFR calc non Af Amer: 57 mL/min — ABNORMAL LOW (ref >60)
Glucose, Bld: 97 mg/dL (ref 65–99)
Potassium: 3.9 mmol/L (ref 3.5–5.1)
Sodium: 139 mmol/L (ref 135–145)

## 2015-12-01 SURGERY — HYDROCELECTOMY
Anesthesia: General | Laterality: Left

## 2015-12-01 MED ORDER — FENTANYL CITRATE (PF) 100 MCG/2ML IJ SOLN: 25.0000 ug | INTRAMUSCULAR | Status: DC | PRN

## 2015-12-01 MED ORDER — PHENYLEPHRINE 40 MCG/ML (10ML) SYRINGE FOR IV PUSH (FOR BLOOD PRESSURE SUPPORT)
PREFILLED_SYRINGE | INTRAVENOUS | Status: AC
  Filled 2015-12-01: qty 10

## 2015-12-01 MED ORDER — MIDAZOLAM HCL 2 MG/2ML IJ SOLN
INTRAMUSCULAR | Status: DC | PRN
  Administered 2015-12-01: 1 mg via INTRAVENOUS

## 2015-12-01 MED ORDER — LIDOCAINE 2% (20 MG/ML) 5 ML SYRINGE
INTRAMUSCULAR | Status: AC
  Filled 2015-12-01: qty 5

## 2015-12-01 MED ORDER — LACTATED RINGERS IV SOLN
INTRAVENOUS | Status: DC | PRN
  Administered 2015-12-01 (×2): via INTRAVENOUS

## 2015-12-01 MED ORDER — BUPIVACAINE-EPINEPHRINE 0.25% -1:200000 IJ SOLN
INTRAMUSCULAR | Status: AC
  Filled 2015-12-01: qty 1

## 2015-12-01 MED ORDER — BUPIVACAINE HCL (PF) 0.25 % IJ SOLN
INTRAMUSCULAR | Status: DC | PRN
  Administered 2015-12-01: 20 mL

## 2015-12-01 MED ORDER — ONDANSETRON HCL 4 MG/2ML IJ SOLN: 4.0000 mg | Freq: Once | INTRAMUSCULAR | Status: DC | PRN

## 2015-12-01 MED ORDER — BUPIVACAINE HCL (PF) 0.25 % IJ SOLN
INTRAMUSCULAR | Status: AC
  Filled 2015-12-01: qty 30

## 2015-12-01 MED ORDER — ONDANSETRON HCL 4 MG/2ML IJ SOLN
INTRAMUSCULAR | Status: AC
  Filled 2015-12-01: qty 2

## 2015-12-01 MED ORDER — LIDOCAINE 2% (20 MG/ML) 5 ML SYRINGE
INTRAMUSCULAR | Status: DC | PRN
  Administered 2015-12-01: 100 mg via INTRAVENOUS

## 2015-12-01 MED ORDER — CEFAZOLIN SODIUM-DEXTROSE 2-4 GM/100ML-% IV SOLN
INTRAVENOUS | Status: AC
  Filled 2015-12-01: qty 100

## 2015-12-01 MED ORDER — FENTANYL CITRATE (PF) 100 MCG/2ML IJ SOLN
INTRAMUSCULAR | Status: AC
  Filled 2015-12-01: qty 2

## 2015-12-01 MED ORDER — LACTATED RINGERS IV SOLN: INTRAVENOUS | Status: DC

## 2015-12-01 MED ORDER — DOCUSATE SODIUM 100 MG PO CAPS: 100.0000 mg | ORAL_CAPSULE | Freq: Two times a day (BID) | ORAL | 0 refills | Status: DC

## 2015-12-01 MED ORDER — PROPOFOL 10 MG/ML IV BOLUS
INTRAVENOUS | Status: AC
  Filled 2015-12-01: qty 20

## 2015-12-01 MED ORDER — FENTANYL CITRATE (PF) 100 MCG/2ML IJ SOLN
INTRAMUSCULAR | Status: DC | PRN
  Administered 2015-12-01: 25 ug via INTRAVENOUS
  Administered 2015-12-01 (×2): 50 ug via INTRAVENOUS

## 2015-12-01 MED ORDER — HYDROCODONE-ACETAMINOPHEN 5-325 MG PO TABS: 1.0000 | ORAL_TABLET | Freq: Four times a day (QID) | ORAL | 0 refills | Status: DC | PRN

## 2015-12-01 MED ORDER — PHENYLEPHRINE HCL 10 MG/ML IJ SOLN
INTRAMUSCULAR | Status: DC | PRN
  Administered 2015-12-01: 80 ug via INTRAVENOUS

## 2015-12-01 MED ORDER — CEFAZOLIN SODIUM-DEXTROSE 2-4 GM/100ML-% IV SOLN
2.0000 g | INTRAVENOUS | Status: AC
  Administered 2015-12-01: 2 g via INTRAVENOUS
  Filled 2015-12-01: qty 100

## 2015-12-01 MED ORDER — PROPOFOL 10 MG/ML IV BOLUS
INTRAVENOUS | Status: DC | PRN
  Administered 2015-12-01: 130 mg via INTRAVENOUS

## 2015-12-01 MED ORDER — ONDANSETRON HCL 4 MG/2ML IJ SOLN
INTRAMUSCULAR | Status: DC | PRN
  Administered 2015-12-01: 4 mg via INTRAVENOUS

## 2015-12-01 MED ORDER — MIDAZOLAM HCL 2 MG/2ML IJ SOLN
INTRAMUSCULAR | Status: AC
  Filled 2015-12-01: qty 2

## 2015-12-01 SURGICAL SUPPLY — 23 items
ADH SKN CLS APL DERMABOND .7 (GAUZE/BANDAGES/DRESSINGS) ×1
BLADE HEX COATED 2.75 (ELECTRODE) ×2 IMPLANT
BNDG GAUZE ELAST 4 BULKY (GAUZE/BANDAGES/DRESSINGS) ×2 IMPLANT
BRIEF STRETCH FOR OB PAD LRG (UNDERPADS AND DIAPERS) ×1 IMPLANT
COVER SURGICAL LIGHT HANDLE (MISCELLANEOUS) ×2 IMPLANT
DERMABOND ADVANCED (GAUZE/BANDAGES/DRESSINGS) ×1
DERMABOND ADVANCED .7 DNX12 (GAUZE/BANDAGES/DRESSINGS) IMPLANT
ELECT REM PT RETURN 9FT ADLT (ELECTROSURGICAL) ×2
ELECTRODE REM PT RTRN 9FT ADLT (ELECTROSURGICAL) ×1 IMPLANT
GLOVE BIOGEL M STRL SZ7.5 (GLOVE) ×2 IMPLANT
GOWN STRL REUS W/TWL LRG LVL3 (GOWN DISPOSABLE) ×2 IMPLANT
KIT BASIN OR (CUSTOM PROCEDURE TRAY) ×2 IMPLANT
NEEDLE HYPO 22GX1.5 SAFETY (NEEDLE) ×1 IMPLANT
NS IRRIG 1000ML POUR BTL (IV SOLUTION) ×2 IMPLANT
PACK GENERAL/GYN (CUSTOM PROCEDURE TRAY) ×2 IMPLANT
SUPPORT SCROTAL LG STRP (MISCELLANEOUS) ×2 IMPLANT
SUT CHROMIC 3 0 SH 27 (SUTURE) ×4 IMPLANT
SUT CHROMIC 4 0 RB 1X27 (SUTURE) ×1 IMPLANT
SUT VIC AB 2-0 UR5 27 (SUTURE) IMPLANT
SUT VICRYL 0 TIES 12 18 (SUTURE) IMPLANT
SYR CONTROL 10ML LL (SYRINGE) ×1 IMPLANT
TOWEL OR 17X26 10 PK STRL BLUE (TOWEL DISPOSABLE) ×3 IMPLANT
WATER STERILE IRR 1500ML POUR (IV SOLUTION) ×1 IMPLANT

## 2015-12-01 NOTE — Anesthesia Postprocedure Evaluation (Signed)
Anesthesia Post Note  Patient: Treylan Pais  Procedure(s) Performed: Procedure(s) (LRB): REPAIR OF LEFT HYDROCELE (Left)  Patient location during evaluation: PACU Anesthesia Type: General Level of consciousness: awake and alert Pain management: pain level controlled Vital Signs Assessment: post-procedure vital signs reviewed and stable Respiratory status: spontaneous breathing, nonlabored ventilation, respiratory function stable and patient connected to nasal cannula oxygen Cardiovascular status: blood pressure returned to baseline and stable Postop Assessment: no signs of nausea or vomiting Anesthetic complications: no    Last Vitals:  Vitals:   12/01/15 0930 12/01/15 0945  BP: (!) 146/93   Pulse: 78 84  Resp: 15 (!) 24  Temp:  (!) 36 C    Last Pain:  Vitals:   12/01/15 0945  TempSrc:   PainSc: 0-No pain                 Lissa Rowles JENNETTE

## 2015-12-01 NOTE — Transfer of Care (Signed)
Immediate Anesthesia Transfer of Care Note  Patient: Leonard [name]  Procedure(s) Performed: Procedure(s): REPAIR OF LEFT HYDROCELE (Left)  Patient Location: PACU  Anesthesia Type:General  Level of Consciousness: sedated  Airway & Oxygen Therapy: Patient Spontanous Breathing and Patient connected to face mask oxygen  Post-op Assessment: Report given to RN and Post -op Vital signs reviewed and stable  Post vital signs: Reviewed and stable  Last Vitals:  Vitals:   12/01/15 0710  BP: (!) 154/91  Pulse: 76  Resp: 18  Temp: 37 C    Last Pain:  Vitals:   12/01/15 0710  TempSrc: Oral      Patients Stated Pain Goal: 3 ([id number])  Complications: No apparent anesthesia complications

## 2015-12-01 NOTE — Anesthesia Procedure Notes (Signed)
Procedure Name: LMA Insertion Date/Time: 12/01/2015 8:28 AM Performed by: Cynda Familia Pre-anesthesia Checklist: Patient identified, Emergency Drugs available, Suction available and Patient being monitored Patient Re-evaluated:Patient Re-evaluated prior to inductionOxygen Delivery Method: Circle System Utilized Preoxygenation: Pre-oxygenation with 100% oxygen Intubation Type: IV induction Ventilation: Mask ventilation without difficulty LMA: LMA inserted LMA Size: 4.0 Tube type: Oral Number of attempts: 1 Placement Confirmation: positive ETCO2 Tube secured with: Tape Dental Injury: Teeth and Oropharynx as per pre-operative assessment  Comments: Smooth IV induction Judd--- LMA AM CRNA atraumatic--  Teeth and mouth as preop-- chipped teeth, loose teeth and missing teeth present prior to LMA insertion--  bilat BS Jillyn Hidden

## 2015-12-01 NOTE — Anesthesia Preprocedure Evaluation (Addendum)
Anesthesia Evaluation  Patient identified by MRN, date of birth, ID band Patient awake    Reviewed: Allergy & Precautions, NPO status , Patient's Chart, lab work & pertinent test results  History of Anesthesia Complications Negative for: history of anesthetic complications  Airway Mallampati: II  TM Distance: >3 FB Neck ROM: Full    Dental no notable dental hx. (+) Poor Dentition, Dental Advisory Given, Loose, Chipped, Missing   Pulmonary asthma , Current Smoker,    Pulmonary exam normal breath sounds clear to auscultation       Cardiovascular hypertension, Normal cardiovascular exam Rhythm:Regular Rate:Normal     Neuro/Psych negative neurological ROS  negative psych ROS   GI/Hepatic negative GI ROS, Neg liver ROS,   Endo/Other  negative endocrine ROS  Renal/GU negative Renal ROS  negative genitourinary   Musculoskeletal negative musculoskeletal ROS (+)   Abdominal   Peds negative pediatric ROS (+)  Hematology negative hematology ROS (+)   Anesthesia Other Findings   Reproductive/Obstetrics negative OB ROS                           Anesthesia Physical Anesthesia Plan  ASA: II  Anesthesia Plan: General   Post-op Pain Management:    Induction: Intravenous  Airway Management Planned: LMA  Additional Equipment:   Intra-op Plan:   Post-operative Plan: Extubation in OR  Informed Consent: I have reviewed the patients History and Physical, chart, labs and discussed the procedure including the risks, benefits and alternatives for the proposed anesthesia with the patient or authorized representative who has indicated his/her understanding and acceptance.   Dental advisory given  Plan Discussed with: CRNA  Anesthesia Plan Comments:         Anesthesia Quick Evaluation

## 2015-12-01 NOTE — Discharge Instructions (Addendum)
1.  Activity:  You should not engage in any heavy lifting (> 10-15 lbs), strenuous activity, or straining for 2-3 weeks 2. Diet: You should advance your diet as tolerated. 3. Prescriptions:  You will be provided a prescription for pain medication to take as needed.  If your pain is not severe enough to require the prescription pain medication, you may take extra strength Tylenol instead which will have less side effects.  You should also take a prescribed stool softener to avoid straining with bowel movements as the prescription pain medication may constipate you. 4. Incisions: You should put an ice pack on the scrotum as much as possible for 48 hours.  You should keep your fluff dressing in place except for when showering for 2-3 days to help minimize swelling of the scrotum. However, swelling is expected and trying to stay off your feet and using an ice pack as tolerated will help to minimize the swelling.  You may shower in 2 days. 5. What to call us about: You should call the office [phone]) if you develop fever > 101 or pain that is not controlled with your pain medication.  General Anesthesia, Adult, Care After Refer to this sheet in the next few weeks. These instructions provide you with information on caring for yourself after your procedure. Your health care provider may also give you more specific instructions. Your treatment has been planned according to current medical practices, but problems sometimes occur. Call your health care provider if you have any problems or questions after your procedure. WHAT TO EXPECT AFTER THE PROCEDURE After the procedure, it is typical to experience:  Sleepiness.  Nausea and vomiting. HOME CARE INSTRUCTIONS  For the first 24 hours after general anesthesia:  Have a responsible person with you.  Do not drive a car. If you are alone, do not take public transportation.  Do not drink alcohol.  Do not take medicine that has not been prescribed by  your health care provider.  Do not sign important papers or make important decisions.  You may resume a normal diet and activities as directed by your health care provider.  Change bandages (dressings) as directed.  If you have questions or problems that seem related to general anesthesia, call the hospital and ask for the anesthetist or anesthesiologist on call. SEEK MEDICAL CARE IF:  You have nausea and vomiting that continue the day after anesthesia.  You develop a rash. SEEK IMMEDIATE MEDICAL CARE IF:   You have difficulty breathing.  You have chest pain.  You have any allergic problems.   This information is not intended to replace advice given to you by your health care provider. Make sure you discuss any questions you have with your health care provider.   Document Released: 05/03/2000 Document Revised: 02/15/2014 Document Reviewed: 05/26/2011 Elsevier Interactive Patient Education [employer].

## 2015-12-01 NOTE — Op Note (Signed)
Preoperative diagnosis: Left hydrocele  Postoperative diagnosis: Left hydrocele  Procedure: Left hydrocelectomy  Surgeon: Pryor Curia. MD  Anesthesia: General  Complications: None  EBL: Minimal  Specimen: Left hydrocele sac sent to pathology  Indication: Mr. Vitucci is a 65 year old gentleman with a symptomatic left sided hydrocele.  He underwent an ultrasound demonstrated no concerning underlying testicular pathology.  After reviewing options for management, he elected to proceed with surgical therapy.  We have reviewed the potential risks, complications, and expected recovery process associated with this procedure.  He has given informed consent to proceed.  Description of procedure: The patient was taken to the operating room and a general anesthetic was administered.  He was given preoperative antibiotics, placed in the supine position, and prepped and draped in the usual sterile fashion.  A preoperative timeout was performed.  A midline scrotal incision was made and the underlying dartos fascia was sharply incised with a knife.  This allowed the hydrocele sac within the tunica vaginalis to be delivered into the operative field and away from the scrotal skin.  An incision was then sharply made in the hydrocele sac well away from the testis.  350 cc of serous fluid was evacuated.  The hydrocele sac was then opened allowing exposure of the testis.  The testis and epididymis appeared normal and without significant underlying pathology.  The excess hydrocele sac was then excised.  The hydrocele sac was then inverted behind the testis and was reapproximated loosely with a running 3-0 chromic suture with care to avoid strangulation or injury to the cord structures.   The testis was then placed back in its normal anatomic position.  The dartos fascia was reapproximated with a running 3-0 chromic suture.  Hemostasis was ensured.  The skin was reapproximated with a running 4-0 chromic  vertical mattress suture.  Dermabond was applied to the skin.  A fluff dressing was placed.  The patient tolerated the procedure well without complications.  He was able to be transferred to the recovery unit in satisfactory condition.

## 2015-12-01 NOTE — Interval H&P Note (Signed)
History and Physical Interval Note:  12/01/2015 7:55 AM  Leonard [name]  has presented today for surgery, with the diagnosis of LEFT HYDROCELE  The various methods of treatment have been discussed with the patient and family. After consideration of risks, benefits and other options for treatment, the patient has consented to  Procedure(s): REPAIR OF LEFT HYDROCELE (Left) as a surgical intervention .  The patient's history has been reviewed, patient examined, no change in status, stable for surgery.  I have reviewed the patient's chart and labs.  Questions were answered to the patient's satisfaction.     [name],LES

## 2016-01-10 ENCOUNTER — Emergency Department (HOSPITAL_COMMUNITY): Payer: PPO

## 2016-01-10 ENCOUNTER — Inpatient Hospital Stay (HOSPITAL_COMMUNITY)
Admission: EM | Admit: 2016-01-10 | Discharge: 2016-01-11 | DRG: 189 | Disposition: A | Discharge status: Home / Self Care | Payer: PPO | Attending: Internal Medicine | Admitting: Internal Medicine

## 2016-01-10 ENCOUNTER — Encounter (HOSPITAL_COMMUNITY): Payer: Self-pay | Admitting: Emergency Medicine

## 2016-01-10 DIAGNOSIS — J9601 Acute respiratory failure with hypoxia: Principal | ICD-10-CM | POA: Diagnosis present

## 2016-01-10 DIAGNOSIS — Z23 Encounter for immunization: Secondary | ICD-10-CM

## 2016-01-10 DIAGNOSIS — I5020 Unspecified systolic (congestive) heart failure: Secondary | ICD-10-CM | POA: Diagnosis present

## 2016-01-10 DIAGNOSIS — R0602 Shortness of breath: Secondary | ICD-10-CM | POA: Diagnosis not present

## 2016-01-10 DIAGNOSIS — R739 Hyperglycemia, unspecified: Secondary | ICD-10-CM

## 2016-01-10 DIAGNOSIS — E872 Acidosis, unspecified: Secondary | ICD-10-CM

## 2016-01-10 DIAGNOSIS — I10 Essential (primary) hypertension: Secondary | ICD-10-CM

## 2016-01-10 DIAGNOSIS — J45901 Unspecified asthma with (acute) exacerbation: Secondary | ICD-10-CM | POA: Diagnosis present

## 2016-01-10 DIAGNOSIS — Z79899 Other long term (current) drug therapy: Secondary | ICD-10-CM | POA: Diagnosis not present

## 2016-01-10 DIAGNOSIS — I160 Hypertensive urgency: Secondary | ICD-10-CM | POA: Diagnosis not present

## 2016-01-10 DIAGNOSIS — Z9079 Acquired absence of other genital organ(s): Secondary | ICD-10-CM | POA: Diagnosis not present

## 2016-01-10 DIAGNOSIS — R0603 Acute respiratory distress: Secondary | ICD-10-CM

## 2016-01-10 DIAGNOSIS — I161 Hypertensive emergency: Secondary | ICD-10-CM

## 2016-01-10 DIAGNOSIS — I509 Heart failure, unspecified: Secondary | ICD-10-CM | POA: Diagnosis present

## 2016-01-10 DIAGNOSIS — I110 Hypertensive heart disease with heart failure: Secondary | ICD-10-CM | POA: Diagnosis not present

## 2016-01-10 DIAGNOSIS — F17210 Nicotine dependence, cigarettes, uncomplicated: Secondary | ICD-10-CM | POA: Diagnosis not present

## 2016-01-10 DIAGNOSIS — Z8546 Personal history of malignant neoplasm of prostate: Secondary | ICD-10-CM | POA: Diagnosis not present

## 2016-01-10 DIAGNOSIS — J690 Pneumonitis due to inhalation of food and vomit: Secondary | ICD-10-CM | POA: Diagnosis not present

## 2016-01-10 DIAGNOSIS — J209 Acute bronchitis, unspecified: Secondary | ICD-10-CM | POA: Diagnosis present

## 2016-01-10 DIAGNOSIS — F1021 Alcohol dependence, in remission: Secondary | ICD-10-CM | POA: Diagnosis not present

## 2016-01-10 LAB — CBC WITH DIFFERENTIAL/PLATELET
Basophils Absolute: 0.1 10*3/uL (ref 0.0–0.1)
Basophils Relative: 0 %
Eosinophils Absolute: 0.7 10*3/uL (ref 0.0–0.7)
Eosinophils Relative: 5 %
HCT: 40.2 % (ref 39.0–52.0)
Hemoglobin: 13.9 g/dL (ref 13.0–17.0)
Lymphocytes Relative: 39 %
Lymphs Abs: 4.9 10*3/uL — ABNORMAL HIGH (ref 0.7–4.0)
MCH: 32.8 pg (ref 26.0–34.0)
MCHC: 34.6 g/dL (ref 30.0–36.0)
MCV: 94.8 fL (ref 78.0–100.0)
Monocytes Absolute: 0.9 10*3/uL (ref 0.1–1.0)
Monocytes Relative: 7 %
Neutro Abs: 6 10*3/uL (ref 1.7–7.7)
Neutrophils Relative %: 49 %
Platelets: 216 10*3/uL (ref 150–400)
RBC: 4.24 MIL/uL (ref 4.22–5.81)
RDW: 13 % (ref 11.5–15.5)
WBC: 12.6 10*3/uL — ABNORMAL HIGH (ref 4.0–10.5)

## 2016-01-10 LAB — I-STAT VENOUS BLOOD GAS, ED
Acid-base deficit: 8 mmol/L — ABNORMAL HIGH (ref 0.0–2.0)
Bicarbonate: 18.6 mmol/L — ABNORMAL LOW (ref 20.0–28.0)
O2 Saturation: 94 %
Patient temperature: 37
TCO2: 20 mmol/L (ref 0–100)
pCO2, Ven: 43 mmHg — ABNORMAL LOW (ref 44.0–60.0)
pH, Ven: 7.245 — ABNORMAL LOW (ref 7.250–7.430)
pO2, Ven: 83 mmHg — ABNORMAL HIGH (ref 32.0–45.0)

## 2016-01-10 LAB — COMPREHENSIVE METABOLIC PANEL
ALT: 41 U/L (ref 17–63)
AST: 101 U/L — ABNORMAL HIGH (ref 15–41)
Albumin: 3.8 g/dL (ref 3.5–5.0)
Alkaline Phosphatase: 93 U/L (ref 38–126)
Anion gap: 14 (ref 5–15)
BUN: 12 mg/dL (ref 6–20)
CO2: 18 mmol/L — ABNORMAL LOW (ref 22–32)
Calcium: 8.8 mg/dL — ABNORMAL LOW (ref 8.9–10.3)
Chloride: 106 mmol/L (ref 101–111)
Creatinine, Ser: 1.57 mg/dL — ABNORMAL HIGH (ref 0.61–1.24)
GFR calc Af Amer: 51 mL/min — ABNORMAL LOW (ref >60)
GFR calc non Af Amer: 44 mL/min — ABNORMAL LOW (ref >60)
Glucose, Bld: 306 mg/dL — ABNORMAL HIGH (ref 65–99)
Potassium: 3.6 mmol/L (ref 3.5–5.1)
Sodium: 138 mmol/L (ref 135–145)
Total Bilirubin: 1.1 mg/dL (ref 0.3–1.2)
Total Protein: 6.8 g/dL (ref 6.5–8.1)

## 2016-01-10 LAB — I-STAT CHEM 8, ED
BUN: 16 mg/dL (ref 6–20)
Calcium, Ion: 1.1 mmol/L — ABNORMAL LOW (ref 1.15–1.40)
Chloride: 107 mmol/L (ref 101–111)
Creatinine, Ser: 1.4 mg/dL — ABNORMAL HIGH (ref 0.61–1.24)
Glucose, Bld: 311 mg/dL — ABNORMAL HIGH (ref 65–99)
HCT: 43 % (ref 39.0–52.0)
Hemoglobin: 14.6 g/dL (ref 13.0–17.0)
Potassium: 3.7 mmol/L (ref 3.5–5.1)
Sodium: 139 mmol/L (ref 135–145)
TCO2: 19 mmol/L (ref 0–100)

## 2016-01-10 LAB — PROCALCITONIN: Procalcitonin: 0.56 ng/mL

## 2016-01-10 LAB — I-STAT TROPONIN, ED: Troponin i, poc: 0.02 ng/mL (ref 0.00–0.08)

## 2016-01-10 LAB — VITAMIN B12: Vitamin B-12: 353 pg/mL (ref 180–914)

## 2016-01-10 LAB — I-STAT CG4 LACTIC ACID, ED
Lactic Acid, Venous: 4.76 mmol/L (ref 0.5–1.9)
Lactic Acid, Venous: 1.85 mmol/L (ref 0.5–1.9)

## 2016-01-10 LAB — MAGNESIUM: Magnesium: 2.1 mg/dL (ref 1.7–2.4)

## 2016-01-10 LAB — BRAIN NATRIURETIC PEPTIDE: B Natriuretic Peptide: 792.4 pg/mL — ABNORMAL HIGH (ref 0.0–100.0)

## 2016-01-10 MED ORDER — ALBUTEROL SULFATE (2.5 MG/3ML) 0.083% IN NEBU
2.5000 mg | INHALATION_SOLUTION | RESPIRATORY_TRACT | Status: DC | PRN
Start: 2016-01-10 — End: 2016-01-11

## 2016-01-10 MED ORDER — ENOXAPARIN SODIUM 40 MG/0.4ML ~~LOC~~ SOLN
40.0000 mg | SUBCUTANEOUS | Status: DC
  Administered 2016-01-10: 40 mg via SUBCUTANEOUS
  Filled 2016-01-10: qty 0.4

## 2016-01-10 MED ORDER — SODIUM CHLORIDE 0.9 % IV SOLN: 250.0000 mL | INTRAVENOUS | Status: DC | PRN

## 2016-01-10 MED ORDER — SODIUM CHLORIDE 0.9 % IV BOLUS (SEPSIS)
1000.0000 mL | Freq: Once | INTRAVENOUS | Status: DC
Start: 2016-01-10 — End: 2016-01-10

## 2016-01-10 MED ORDER — PREDNISONE 20 MG PO TABS
40.0000 mg | ORAL_TABLET | Freq: Every day | ORAL | Status: DC
  Administered 2016-01-11: 40 mg via ORAL
  Filled 2016-01-10: qty 2

## 2016-01-10 MED ORDER — FOLIC ACID 5 MG/ML IJ SOLN
1.0000 mg | Freq: Every day | INTRAMUSCULAR | Status: DC
  Administered 2016-01-10: 1 mg via INTRAVENOUS
  Filled 2016-01-10 (×2): qty 0.2

## 2016-01-10 MED ORDER — DEXTROSE 5 % IV SOLN
1.0000 g | Freq: Once | INTRAVENOUS | Status: AC
  Administered 2016-01-10: 1 g via INTRAVENOUS
  Filled 2016-01-10: qty 10

## 2016-01-10 MED ORDER — LORAZEPAM 2 MG/ML IJ SOLN: 1.0000 mg | Freq: Four times a day (QID) | INTRAMUSCULAR | Status: DC | PRN

## 2016-01-10 MED ORDER — METHYLPREDNISOLONE SODIUM SUCC 125 MG IJ SOLR
125.0000 mg | Freq: Once | INTRAMUSCULAR | Status: AC
  Administered 2016-01-10: 125 mg via INTRAVENOUS

## 2016-01-10 MED ORDER — MAGNESIUM SULFATE 2 GM/50ML IV SOLN
2.0000 g | Freq: Once | INTRAVENOUS | Status: AC
  Administered 2016-01-10: 2 g via INTRAVENOUS

## 2016-01-10 MED ORDER — THIAMINE HCL 100 MG/ML IJ SOLN
100.0000 mg | Freq: Every day | INTRAMUSCULAR | Status: DC
  Administered 2016-01-10 – 2016-01-11 (×2): 100 mg via INTRAVENOUS
  Filled 2016-01-10 (×2): qty 2

## 2016-01-10 MED ORDER — PIPERACILLIN-TAZOBACTAM 3.375 G IVPB
3.3750 g | Freq: Three times a day (TID) | INTRAVENOUS | Status: DC
  Administered 2016-01-10 – 2016-01-11 (×3): 3.375 g via INTRAVENOUS
  Filled 2016-01-10 (×7): qty 50

## 2016-01-10 MED ORDER — SODIUM CHLORIDE 0.9% FLUSH
3.0000 mL | Freq: Two times a day (BID) | INTRAVENOUS | Status: DC
  Administered 2016-01-10: 3 mL via INTRAVENOUS

## 2016-01-10 MED ORDER — AZITHROMYCIN 500 MG PO TABS
250.0000 mg | ORAL_TABLET | Freq: Once | ORAL | Status: AC
  Administered 2016-01-11: 250 mg via ORAL
  Filled 2016-01-10: qty 1

## 2016-01-10 MED ORDER — LORAZEPAM 1 MG PO TABS
1.0000 mg | ORAL_TABLET | Freq: Four times a day (QID) | ORAL | Status: DC | PRN
Start: 2016-01-10 — End: 2016-01-11

## 2016-01-10 MED ORDER — SODIUM CHLORIDE 0.9% FLUSH
3.0000 mL | INTRAVENOUS | Status: DC | PRN
Start: 2016-01-10 — End: 2016-01-11

## 2016-01-10 MED ORDER — LORAZEPAM 2 MG/ML IJ SOLN: 2.0000 mg | INTRAMUSCULAR | Status: DC | PRN

## 2016-01-10 MED ORDER — ONDANSETRON HCL 4 MG/2ML IJ SOLN: 4.0000 mg | Freq: Four times a day (QID) | INTRAMUSCULAR | Status: DC | PRN

## 2016-01-10 MED ORDER — IPRATROPIUM-ALBUTEROL 0.5-2.5 (3) MG/3ML IN SOLN
3.0000 mL | Freq: Four times a day (QID) | RESPIRATORY_TRACT | Status: DC
  Administered 2016-01-10 – 2016-01-11 (×3): 3 mL via RESPIRATORY_TRACT
  Filled 2016-01-10 (×3): qty 3

## 2016-01-10 MED ORDER — NITROGLYCERIN IN D5W 200-5 MCG/ML-% IV SOLN
0.0000 ug/min | Freq: Once | INTRAVENOUS | Status: AC
  Administered 2016-01-10: 50 ug/min via INTRAVENOUS

## 2016-01-10 MED ORDER — DEXTROSE 5 % IV SOLN
500.0000 mg | Freq: Once | INTRAVENOUS | Status: AC
  Administered 2016-01-10: 500 mg via INTRAVENOUS
  Filled 2016-01-10: qty 500

## 2016-01-10 MED ORDER — ADULT MULTIVITAMIN W/MINERALS CH
1.0000 | ORAL_TABLET | Freq: Every day | ORAL | Status: DC
  Administered 2016-01-10 – 2016-01-11 (×2): 1 via ORAL
  Filled 2016-01-10 (×2): qty 1

## 2016-01-10 MED ORDER — SODIUM CHLORIDE 0.9% FLUSH: 3.0000 mL | Freq: Two times a day (BID) | INTRAVENOUS | Status: DC

## 2016-01-10 NOTE — H&P (Signed)
Date: 01/10/2016               Patient Name:  Leonard [name] MRN: [id number]  DOB: [birth year] Age / Sex: 65 y.o., male   PCP: No Pcp Per Patient         Medical Service: Internal Medicine Teaching Service         Attending Physician: Dr. Oval Linsey, MD    First Contact: Dr. Heber [place] Pager: [phone]  Second Contact: Dr. Charlynn Grimes Pager: [phone]       After Hours (After 5p/  First Contact Pager: [phone]  weekends / holidays): Second Contact Pager: [phone]   Chief Complaint: shortness of breath  History of Present Illness: Leonard [name] is A 65 year old gentleman with past medical history of hypertension, prostate cancer as/PE laparoscopic radical proctectomy and 60-pack-year smoking history that presents to the ED for shortness of breath that started 10 days ago and has progressively worsened. He reports his shortness of breath only occurs at night when he lays down and has had to prop himself in order to sleep. He has an associated cough that he states is chronic however, worse when he lays down.  He reports being able to do his daily activities without developing shortness of breath. Prior to 10 days ago he was in his usual state of health and has not been around sick contacts.  His shortness of breath is a new symptom for him.  He states that last night he had several drinks to celebrate his birthday and after going to bed was woken up due to not being able to breathe. He was brought into the hospital by EMS.  He denies any fever or chills, hemoptysis, nausea or vomiting, chest pain, difficulty urinating, or lower extremity swelling.  He states he drinks 3-4 alcoholic beverages a night.  He denies history of withdrawal symptoms including tremors and seizures.     Meds:  Current Meds  Medication Sig  . albuterol (PROVENTIL HFA;VENTOLIN HFA) 108 (90 Base) MCG/ACT inhaler Inhale 1 puff into the lungs every 6 (six) hours as needed for wheezing or shortness of breath.  . docusate  sodium (COLACE) 100 MG capsule Take 1 capsule (100 mg total) by mouth 2 (two) times daily.  Marland Kitchen HYDROcodone-acetaminophen (NORCO/VICODIN) 5-325 MG tablet Take 1-2 tablets by mouth every 6 (six) hours as needed.     Allergies: Allergies as of 01/10/2016  . (No Known Allergies)   Past Medical History:  Diagnosis Date  . Asthma   . Hypertension   . Prostate cancer [facility])     Family History:  Family History  Problem Relation Age of Onset  . Cancer Neg Hx     Social History:  Alcohol: Reports 3-4 drinks per day Tobacco use: Reports a 1.5 pack per day for the past 40 years.  States he hasn't had a cigarette in 2 weeks Occupation: Mixologist  Review of Systems: A complete ROS was negative except as per HPI.   Physical Exam: Blood pressure 143/80, pulse 92, temperature (!) 96.8 F (36 C), temperature source Axillary, resp. rate 18, height 5\' 8"  (1.727 m), weight 180 lb (81.6 kg), SpO2 97 %. Gen: Patient is well appearing on bipap, fully conversant with team.  HEENT: normocephalic and atraumatic. Pupils are equally round and reactive to light. Anicteric. No oral lesions present. Tongue is normal size.  Cv: Mild deformity of R pectoral muscle. Regular Rate and rhythm. NL s1 s2 with no heaves rubs or murmurs. JVD Vascular:Radial,  dorsalis pedis and posterior tibial pulses 2+.  Resp: Normal work of breathing. Crackles present at lung bases bilaterally R>L.  Tympanic percussion throughout.  Abdomen: No abdominal scars present.Non distended. Normal bowel sounds. Not tender to palpitation.Symmetric percussion throughout. No organomegaly or masses.  Neuro: Oriented x3. Normal visual acuity. EOM intact. Facial motor symmetry. Facial sensation is symmetric. Uvula and tongue are midline. Grossly normal strength throughout. Appropriate dexterity.  Psych: normal mood and affect.  Ext: Warm extremities. No LE edema. No cyanosis or clubbing. No rashes present.Cap refill<2secs.  MSK: No obvious  deformed joints.  CBC    Component Value Date/Time   WBC 12.6 (H) 01/10/2016 0634   RBC 4.24 01/10/2016 0634   HGB 14.6 01/10/2016 0636   HCT 43.0 01/10/2016 0636   PLT 216 01/10/2016 0634   MCV 94.8 01/10/2016 0634   MCH 32.8 01/10/2016 0634   MCHC 34.6 01/10/2016 0634   RDW 13.0 01/10/2016 0634   LYMPHSABS 4.9 (H) 01/10/2016 0634   MONOABS 0.9 01/10/2016 0634   EOSABS 0.7 01/10/2016 0634   BASOSABS 0.1 01/10/2016 0634   BMET    Component Value Date/Time   NA 139 01/10/2016 0636   K 3.7 01/10/2016 0636   CL 107 01/10/2016 0636   CO2 18 (L) 01/10/2016 0634   GLUCOSE 311 (H) 01/10/2016 0636   BUN 16 01/10/2016 0636   CREATININE 1.40 (H) 01/10/2016 0636   CALCIUM 8.8 (L) 01/10/2016 0634   GFRNONAA 44 (L) 01/10/2016 0634   GFRAA 51 (L) 01/10/2016 0634    EKG: Pending  CXR:  IMPRESSION: Mild cardiomegaly, vascular congestion and new bibasilar airspace opacities. Findings may reflect congestive heart failure or sequela of aspiration.  Assessment & Plan by Problem: Active Problems:  Acute respiratory failure with hypoxia (HCC) Patient woke up suddenly overnight due to not being able to breath.  In the ED he was found to have a blood pressure of 212/120 and satting 85% on room air and was placed on bipap.  In the ED he was given salmeterol, magnesium, and albuterol.  Chest x-ray shows vascular congestion and bibasilar airspace.  I am concerned for aspiration pneumonia since patient has a history of heavy alcohol use and has a leukocytosis and cough.  He does not have a fever or pleuritic chest pain which makes aspiration pneumonia not completely certain.   This could be the onset of congestive heart failure.  Patient has uncontrolled hypertension and does not take blood pressure medication.  He has a 60 year pack smoking history and heavy alcohol history.  BNP is elevated at 792.4 and bibasilar crackles on exam.  During the history he reports orthopnea.  Would consider ECHO if  patient does not improve with antibiotic treatment.    With an extensive smoking history there is also concern for COPD.  There are no PFTs on file.  There is no wheezing on exam and with no changes in sputum production I am not concerned for COPD exacerbation at this time, although possible.  - Azithromycin and Zosyn - Procalcitonin - albuterol Q4PRN - Duoneb Q6H - Prednisone 40mg  - CBC - BMET  Increased anion gap metabolic acidosis secondary to Lactic Acidosis -resolved.  Alcohol abuse Patient has a 3-4 alcoholic daily drink history.  He denies any withdrawal symptoms in the past, including tremors or seizures.   -CIWA  Hypertension Patient had elevated blood pressures on admission, 212/120.  Pressures have stabilized and are in the 150s/80s.  Patient states that he used to  be on lisinopril but has been non-compliant.  Consider starting blood pressure medication in hospital.  Patient does not have PCP and will need follow up in the internal medicine teaching program clinic.  -monitor vitals - follow up in [facility]  Hyperglycemia Blood glucose 311.  Unsure if this is secondary to steriods vs infection vs undiagnosed diabetes.  Will obtain A1C. -A1C -BMET  Probable CKD stage III Creatinine appears to be around previous baseline of ~1.4 -BMET  History of prostate cancer s/p radical proctectomy  Patient states he had surgery one month ago.  He denies any issues urinating.   Dispo: Admit patient to Observation with expected length of stay less than 2 midnights.  Signed: Valinda Party, DO 01/10/2016, 12:39 PM  Pager: [phone]

## 2016-01-10 NOTE — ED Provider Notes (Signed)
[place] DEPT Provider Note   CSN: [id number] Arrival date & time: 01/10/16  0608     History   Chief Complaint Chief Complaint  Patient presents with  . Shortness of Breath    HPI Leonard [name] is a 65 y.o. male.  HPI   65 year old male who presents with EMS for concern of respiratory distress. Patient initially unable to provide much history given severe dyspnea, however on reevaluation, he and his girlfriend state that he's had shortness of breath and wheezing over the last week. Reports mild cough, however no severe cough, no fevers, no chest pain. Reports the dyspnea was worse when laying down. Reports a history of asthma. Was using his albuterol inhaler with only mild relief. This became significantly worse at approximately 5 AM, and he called EMS. On EMS arrival, patient's blood pressures were 230s over 130s, he was found to be satting 45% on arrival, was placed on CPAP with oxygenation improving to 84%.  Patient reports he has had episodes like this before, however not as severe. Recently had hydrocele surgery but was outpt. No recent admissions in last 3 mos.   Past Medical History:  Diagnosis Date  . Asthma   . Hypertension   . Prostate cancer [facility])     Patient Active Problem List   Diagnosis Date Noted  . Prostate cancer ([place]) 09/24/2013  . Malignant neoplasm of prostate ([place]) 07/03/2013  . HTN (hypertension) 07/03/2013    Past Surgical History:  Procedure Laterality Date  . APPENDECTOMY    . HYDROCELE EXCISION Left 12/01/2015   Procedure: REPAIR OF LEFT HYDROCELE;  Surgeon: Raynelle Bring, MD;  Location: [facility] ORS;  Service: Urology;  Laterality: Left;  . LYMPHADENECTOMY Bilateral 09/24/2013   Procedure: LYMPHADENECTOMY;  Surgeon: Raynelle Bring, MD;  Location: [facility] ORS;  Service: Urology;  Laterality: Bilateral;  . PROSTATE BIOPSY    . ROBOT ASSISTED LAPAROSCOPIC RADICAL PROSTATECTOMY N/A 09/24/2013   Procedure: ROBOTIC ASSISTED LAPAROSCOPIC RADICAL  PROSTATECTOMY LEVEL 2;  Surgeon: Raynelle Bring, MD;  Location: [facility] ORS;  Service: Urology;  Laterality: N/A;       Home Medications    Prior to Admission medications   Medication Sig Start Date End Date Taking? Authorizing Provider  albuterol (PROVENTIL HFA;VENTOLIN HFA) 108 (90 Base) MCG/ACT inhaler Inhale 1 puff into the lungs every 6 (six) hours as needed for wheezing or shortness of breath.    Historical Provider, MD  docusate sodium (COLACE) 100 MG capsule Take 1 capsule (100 mg total) by mouth 2 (two) times daily. 12/01/15   Raynelle Bring, MD  HYDROcodone-acetaminophen (NORCO/VICODIN) 5-325 MG tablet Take 1-2 tablets by mouth every 6 (six) hours as needed. 12/01/15   Raynelle Bring, MD    Family History Family History  Problem Relation Age of Onset  . Cancer Neg Hx     Social History Social History  Substance Use Topics  . Smoking status: Current Every Day Smoker    Packs/day: 2.00    Years: 30.00    Types: Cigarettes  . Smokeless tobacco: Never Used  . Alcohol use Yes     Comment: 5 glasses of alcohol per day     Allergies   Patient has no known allergies.   Review of Systems Review of Systems  Constitutional: Negative for fever.  HENT: Negative for sore throat.   Eyes: Negative for visual disturbance.  Respiratory: Positive for cough, shortness of breath and wheezing.   Cardiovascular: Negative for chest pain and leg swelling.  Gastrointestinal: Negative for abdominal  pain, nausea and vomiting.  Genitourinary: Negative for difficulty urinating.  Musculoskeletal: Negative for back pain and neck stiffness.  Skin: Negative for rash.  Neurological: Negative for syncope and headaches.     Physical Exam Updated Vital Signs BP 153/93   Pulse 94   Temp (!) 96.8 F (36 C) (Axillary)   Resp 18   Ht 5\' 8"  (1.727 m)   Wt 180 lb (81.6 kg)   SpO2 100%   BMI 27.37 kg/m   Physical Exam  Constitutional: He is oriented to person, place, and time. He appears  well-developed and well-nourished. He has a sickly appearance. He appears ill. He appears distressed.  HENT:  Head: Normocephalic and atraumatic.  Eyes: Conjunctivae and EOM are normal.  Neck: Normal range of motion. JVD present.  Cardiovascular: Regular rhythm, normal heart sounds and intact distal pulses.  Tachycardia present.  Exam reveals no gallop and no friction rub.   No murmur heard. Pulmonary/Chest: Accessory muscle usage present. Tachypnea noted. He is in respiratory distress. He has decreased breath sounds. He has wheezes. He has rales.  Severely decreased breath sounds bilaterally, occasional wheeze, occasional rales lower bilaterally  Abdominal: Soft. He exhibits no distension. There is no tenderness. There is no guarding.  Musculoskeletal: He exhibits edema (trace sock lines).  Neurological: He is alert and oriented to person, place, and time.  Skin: Skin is warm. He is diaphoretic.  Nursing note and vitals reviewed.    ED Treatments / Results  Labs (all labs ordered are listed, but only abnormal results are displayed) Labs Reviewed  CBC WITH DIFFERENTIAL/PLATELET - Abnormal; Notable for the following:       Result Value   WBC 12.6 (*)    Lymphs Abs 4.9 (*)    All other components within normal limits  COMPREHENSIVE METABOLIC PANEL - Abnormal; Notable for the following:    CO2 18 (*)    Glucose, Bld 306 (*)    Creatinine, Ser 1.57 (*)    Calcium 8.8 (*)    AST 101 (*)    GFR calc non Af Amer 44 (*)    GFR calc Af Amer 51 (*)    All other components within normal limits  BRAIN NATRIURETIC PEPTIDE - Abnormal; Notable for the following:    B Natriuretic Peptide 792.4 (*)    All other components within normal limits  I-STAT VENOUS BLOOD GAS, ED - Abnormal; Notable for the following:    pH, Ven 7.245 (*)    pCO2, Ven 43.0 (*)    pO2, Ven 83.0 (*)    Bicarbonate 18.6 (*)    Acid-base deficit 8.0 (*)    All other components within normal limits  I-STAT CHEM 8, ED -  Abnormal; Notable for the following:    Creatinine, Ser 1.40 (*)    Glucose, Bld 311 (*)    Calcium, Ion 1.10 (*)    All other components within normal limits  I-STAT CG4 LACTIC ACID, ED - Abnormal; Notable for the following:    Lactic Acid, Venous 4.76 (*)    All other components within normal limits  CULTURE, BLOOD (ROUTINE X 2)  CULTURE, BLOOD (ROUTINE X 2)  MAGNESIUM  I-STAT TROPOININ, ED    EKG  EKG Interpretation None       Radiology Dg Chest Portable 1 View  Result Date: 01/10/2016 CLINICAL DATA:  Shortness of breath. EXAM: PORTABLE CHEST 1 VIEW COMPARISON:  05/02/2014. FINDINGS: 0654 hours. There are lower lung volumes. Even accounting for this and  the portable AP nature of the current examination, there is new mild cardiac enlargement. There is vascular congestion with new bibasilar airspace opacities. No pneumothorax or significant pleural effusion identified. The bones appear unchanged. IMPRESSION: Mild cardiomegaly, vascular congestion and new bibasilar airspace opacities. Findings may reflect congestive heart failure or sequela of aspiration. Electronically Signed   By: Richardean Sale M.D.   On: 01/10/2016 08:08    Procedures Procedures (including critical care time)  Medications Ordered in ED Medications  cefTRIAXone (ROCEPHIN) 1 g in dextrose 5 % 50 mL IVPB (not administered)  azithromycin (ZITHROMAX) 500 mg in dextrose 5 % 250 mL IVPB (not administered)  methylPREDNISolone sodium succinate (SOLU-MEDROL) 125 mg/2 mL injection 125 mg (125 mg Intravenous Given 01/10/16 [id number])  magnesium sulfate IVPB 2 g 50 mL (0 g Intravenous Stopped 01/10/16 0723)  nitroGLYCERIN 50 mg in dextrose 5 % 250 mL (0.2 mg/mL) infusion (50 mcg/min Intravenous New Bag/Given 01/10/16 [id number])   CRITICAL CARE: Respiratory distress/htn emergency Performed by: Alvino Chapel   Total critical care time: 30 minutes  Critical care time was exclusive of separately billable procedures  and treating other patients.  Critical care was necessary to treat or prevent imminent or life-threatening deterioration.  Critical care was time spent personally by me on the following activities: development of treatment plan with patient and/or surrogate as well as nursing, discussions with consultants, evaluation of patient's response to treatment, examination of patient, obtaining history from patient or surrogate, ordering and performing treatments and interventions, ordering and review of laboratory studies, ordering and review of radiographic studies, pulse oximetry and re-evaluation of patient's condition.   Initial Impression / Assessment and Plan / ED Course  I have reviewed the triage vital signs and the nursing notes.  Pertinent labs & imaging results that were available during my care of the patient were reviewed by me and considered in my medical decision making (see chart for details).  Clinical Course    79 -year-old male with history of hypertension, asthma, prostate cancer presents with concern for respiratory distress in setting of developing dyspnea, wheezing and orthopnea this week.  Patient hypertensive to 230s over 130s with EMS and on arrival to the emergency department, hypoxic to 84% on EMS CPAP.  On arrival to the emergency department, respiratory was at bedside, and patient was placed on ED BiPAP 100%FiO2 with immediate improvement of saturation.  He was placed on a nitro gtt for concern of hypertensive emergency with improvement of blood pressures down to the [id number] systolic. He was given Solu-Medrol, magnesium, albuterol and Atrovent given concern for asthma exacerbation, diminished breath sounds and wheezing.  With BiPAP, nitro, nebulizers, patient's respiratory status has significantly improved. He is able to talk through BiPAP, resp rate decreased, blood pressures improved.  XR returned concerning for edema with asymmetry on my evaluation, will treat for  community-acquired pneumonia with Rocephin and azithromycin, although clinically I feel htn emergency/CHF and asthma are likely etiology of his symptoms.  Lactic acid is 4.76, however feel this is likely secondary to patient's hypoxia and respiratory distress, and unlikely sepsis.  Radiology read XR also as vascular congestion.  Doubt PE given feeling other etiologies of dyspnea more likely, opacities on XR. Trop negative, EKG shows sinus tachycardia without other acute changes.   Will admit to IM stepdown unit for further care.  Final Clinical Impressions(s) / ED Diagnoses   Final diagnoses:  Hypertensive emergency  Severe asthma with exacerbation, unspecified whether persistent  Lactic acidosis  Acute respiratory  failure with hypoxia [facility])    New Prescriptions New Prescriptions   No medications on file     Gareth Morgan, MD 01/10/16 [phone]

## 2016-01-10 NOTE — H&P (Signed)
Date: 01/10/2016               Patient Name:  Leonard [name] MRN: [id number]  DOB: Jul 27, 1950 Age / Sex: 65 y.o., male   PCP: No Pcp Per Patient              Medical Service: Internal Medicine Teaching Service              Attending Physician: Dr. Oval Linsey, MD    First Contact: Sydnee Levans, MS 3 Pager: [phone]  Second Contact: Dr. Heber Rockwall Pager: [phone]  Third Contact Dr. Charlynn Grimes Pager: [phone]       After Hours (After 5p/  First Contact Pager: [phone]  weekends / holidays): Second Contact Pager: [phone]   Chief Complaint: shortness of breath  History of Present Illness: Leonard [name] is a 65 yo 10 pack year M with a PMH of HTN, childhood asthma, prostate cancer s/p laparoscopic radical proctectomy who presented to the ED with shortness of breath for 1 week. The patient was in his usual state of health when he started to develop shortness of breathe while lying down at night about 10 days ago. He says it improves if he gets up and when he uses his friend's albuterol. The patient doesn't use extra pillows. He has no associated fevers sweats chills congestion chest pain. Complains of a cough but says this is normal. No increase in leg swelling or new abdominal distension. He states that he has no shortness of breathe during the day. He denies any reduction in his activity level. Last night, after drinking to celebrate his birthday, he awoke with extreme shortness of breathe and called EMS. EMS brought the patient to the ED which found his BP 230s/130s with sats below 70%. He was placed on BIPAP with salmeterol, magnesium, and albuterol with sats returning to 100%. He was started on a nitro drip to control his Bp, and started on rocephin and azithromycin for a possible CAP found on CXR. He was admitted to the inpatient service.   Meds: No current facility-administered medications for this encounter.    Current Outpatient Prescriptions  Medication Sig Dispense Refill  . albuterol  (PROVENTIL HFA;VENTOLIN HFA) 108 (90 Base) MCG/ACT inhaler Inhale 1 puff into the lungs every 6 (six) hours as needed for wheezing or shortness of breath.    . docusate sodium (COLACE) 100 MG capsule Take 1 capsule (100 mg total) by mouth 2 (two) times daily. 30 capsule 0  . HYDROcodone-acetaminophen (NORCO/VICODIN) 5-325 MG tablet Take 1-2 tablets by mouth every 6 (six) hours as needed. 25 tablet 0    Allergies: Allergies as of 01/10/2016  . (No Known Allergies)   Past Medical History:  Diagnosis Date  . Asthma   . Hypertension   . Prostate cancer [facility])    Past Surgical History:  Procedure Laterality Date  . APPENDECTOMY    . HYDROCELE EXCISION Left 12/01/2015   Procedure: REPAIR OF LEFT HYDROCELE;  Surgeon: Raynelle Bring, MD;  Location: [facility];  Service: Urology;  Laterality: Left;  . LYMPHADENECTOMY Bilateral 09/24/2013   Procedure: LYMPHADENECTOMY;  Surgeon: Raynelle Bring, MD;  Location: [facility];  Service: Urology;  Laterality: Bilateral;  . PROSTATE BIOPSY    . ROBOT ASSISTED LAPAROSCOPIC RADICAL PROSTATECTOMY N/A 09/24/2013   Procedure: ROBOTIC ASSISTED LAPAROSCOPIC RADICAL PROSTATECTOMY LEVEL 2;  Surgeon: Raynelle Bring, MD;  Location: [facility];  Service: Urology;  Laterality: N/A;   Family History  Problem Relation Age of Onset  . Cancer Neg Hx  Social History   Social History  . Marital status: Single    Spouse name: N/A  . Number of children: N/A  . Years of education: N/A   Occupational History  . Not on file.   Social History Main Topics  . Smoking status: Current Every Day Smoker    Packs/day: 2.00    Years: 30.00    Types: Cigarettes  . Smokeless tobacco: Never Used  . Alcohol use Yes     Comment: 5 glasses of alcohol per day  . Drug use:     Types: Marijuana     Comment: monthly/ states last joint 3 yrs ago  . Sexual activity: Yes   Other Topics Concern  . Not on file   Social History Narrative  . No narrative on file    Review of  Systems: Constitutional: negative for chills, fatigue, fevers and sweats Eyes: negative for Changes in vision Ears, nose, mouth, throat, and face: negative for nasal congestion Respiratory: negative for hemoptysis and pleurisy/chest pain Cardiovascular: negative for chest pain, fatigue and palpitations Gastrointestinal: negative for abdominal pain, change in bowel habits, constipation, diarrhea, melena and vomiting Genitourinary:negative for dysuria Integument/breast: negative for rash Hematologic/lymphatic: negative for bleeding and petechiae Neurological: negative for headaches  Physical Exam: Blood pressure 155/95, pulse 95, temperature (!) 96.8 F (36 C), temperature source Axillary, resp. rate 22, height 5\' 8"  (1.727 m), weight 81.6 kg (180 lb), SpO2 97 %. Gen: Patient is well appearing wearing a cpap, fully conversant with team.  HEENT: normocephalic and atraumatic. Pupils are equally round and reactive to light. Dry mucus membranes. Anicteric. No oral lesions present. Tongue is normal size.  Cv: Mild deformity of R pectoral muscle. Regular Rate and rhythm. NL s1 s2 with no heaves rubs or murmurs. JVD Vascular:. Radial, dorsalis pedis and posterior tibial pulses 2+.  Resp: Normal work of breathing. Crackles present at lung bases bilaterally R>L.  Tympanic percussion throughout.  Abdomen: No abdominal scars present.Non distended. Normal bowel sounds. Not tender to palpitation. Symmetric percussion throughout. No organomegaly or masses.  Neuro: Oriented x3. Normal visual acuity. EOM intact. Facial motor symmetry. Facial sensation is symmetric. Uvula and tongue are midline. Grossly normal strength throughout. Appropriate dexterity.  Psych: normal mood and affect.  Ext: Warm extremities. No LE edema. No cyanosis or clubbing. No rashes present. Cap refill<2secs.  MSK: No obvious deformed joints.  Lab results: CMP notable for Cr of 1.57 (baseline of 1.4) and AST ALT of 101 and 41. Lactic  acid of 4.76. BNP of 792.4. CBC with diff notable for 12.6.   Imaging results:  Dg Chest Portable 1 View  Result Date: 01/10/2016 CLINICAL DATA:  Shortness of breath. EXAM: PORTABLE CHEST 1 VIEW COMPARISON:  05/02/2014. FINDINGS: 0654 hours. There are lower lung volumes. Even accounting for this and the portable AP nature of the current examination, there is new mild cardiac enlargement. There is vascular congestion with new bibasilar airspace opacities. No pneumothorax or significant pleural effusion identified. The bones appear unchanged. IMPRESSION: Mild cardiomegaly, vascular congestion and new bibasilar airspace opacities. Findings may reflect congestive heart failure or sequela of aspiration. Electronically Signed   By: Richardean Sale M.D.   On: 01/10/2016 08:08    Other results: EKG: pending  Assessment & Plan by Problem: Active Problems:   Acute respiratory failure with hypoxia (HCC)  Leonard [name] is a 65 yo 73 pack year M with a PMH of HTN, childhood asthma, prostate cancer s/p laparoscopic radical proctectomy who presented to  the ED with respiratoy failure 2/2 to a Acute bronchitis/CHF, hypertensive urgency and suspected CAP.    Acute Bronchitis - 10 day h/o orthopnea and dyspnea improving with albuterol, chronic cough, 60 pack year history, and hypoxia requiring BIPAP is consistent with a COPD exacerbation. S/p Albuterol, mag, methylprednisolone. Will continue with breathing treatments and Abx coverage.    - BIPAP, try to wean off  - Duonebs   - Azithromycin 500 mg PO qd  - Continuous pulse ox  CHF - Although no history of swelling or orthopnea, the patient's JVD, elevated BNP and lactic acid in the setting of acute bronchitis is suggestive of poor cardiac reserve suggestive of heart failure. The patient doesn't complain of any symptoms suggesting a recent MI or other etiology that could cause poor cardiac reserve, however the patient has history of HTN without medications and  alcohol abuse that could cause cardiac failure. The patient should be evaluated outpatient for an TTE Echo and enciouraged to stop alcohol intake.    - Outpatient Echo  - trend lactic acid  CAP - Patient's hisotry of 10 day dyspnea with recent worsening overnight after drinking etoh and infiltrate on CXR of the R lung is suggestive of an aspiration pneumonia. Although the patient remain's afebrile, the patient has an elevated WBC. This could be due to the inhaled steroids as well. The patient has received azi and rocephin. Abx coverage should be extended to incorporate anaerobes.   - Augmentin 875 mg BID PO  - CBC qd  - BMP qd  Hypertensive Urgency - no signs of organd damage with blood pressures in the 230s. This may be due to his high baseline.    - montior vitals  Alcohol Abuse - 4-5 beers a day. Monitor for withdrawal.   - CIWA  - Thiamine  Lack of PCP Will f/u at [facility] internal medicine clinic   FEN/GI  - Ice chips while on BIPAP  - DVT prophalix: enoxaparin 40 mg  DISPO  - Wean off BIPAP  This is a Careers information officer Note.  The care of the patient was discussed with Dr. Heber Bellbrook and the assessment and plan was formulated with their assistance.  Please see their note for official documentation of the patient encounter.   Signed: Benn Moulder, Medical Student 01/10/2016, 10:34 AM

## 2016-01-10 NOTE — ED Notes (Signed)
Lunch tray ordered; heart healthy diet 

## 2016-01-10 NOTE — Progress Notes (Signed)
Pharmacy Antibiotic Note  Leonard [name] is a 65 y.o. male admitted on 01/10/2016 to start zosyn for CAP/COPD exacerbation with poss aspiration PNA on CXR. Received Ceftriaxone 1g x1 and azithromycin 500mg  x1 this morning. Currently afebrile, WBC slightly elevated at 12.9. LA 4.76>1.85. SCr 1.4 - unsure of baseline.  Plan: -Zosyn 3.375g q8h -Monitor renal fxn, clinical course, LOT  Height: 5\' 7"  (170.2 cm) Weight: 169 lb (76.7 kg) IBW/kg (Calculated) : 66.1  Temp (24hrs), Avg:97.4 F (36.3 C), Min:96.8 F (36 C), Max:98 F (36.7 C)   Recent Labs Lab 01/10/16 0634 01/10/16 0636 01/10/16 0924  WBC 12.6*  --   --   CREATININE 1.57* 1.40*  --   LATICACIDVEN  --  4.76* 1.85    Estimated Creatinine Clearance: 48.5 mL/min (by C-G formula based on SCr of 1.4 mg/dL (H)).    No Known Allergies  Antimicrobials this admission:  12/2 CTX x1 12/2 azithro x1 (one dose PO scheduled for 12/3) Zosyn 12/2 >>  Dose adjustments this admission:   Microbiology results:  12/2 BCx: sent  Thank you for allowing pharmacy to be a part of this patient's care.  Stephens November, PharmD Clinical Pharmacist 2:54 PM, 01/10/2016

## 2016-01-10 NOTE — ED Notes (Signed)
Zithromax stopped

## 2016-01-10 NOTE — ED Triage Notes (Signed)
Woke up just before 5am with sudden onset SOB.  Tripod position when ems arrived.  On bipap at 10 on arrival to room.

## 2016-01-10 NOTE — ED Notes (Signed)
Admitting MDs at bedside.

## 2016-01-11 ENCOUNTER — Other Ambulatory Visit: Payer: Self-pay

## 2016-01-11 ENCOUNTER — Inpatient Hospital Stay (HOSPITAL_COMMUNITY): Payer: PPO

## 2016-01-11 DIAGNOSIS — E872 Acidosis: Secondary | ICD-10-CM | POA: Diagnosis not present

## 2016-01-11 DIAGNOSIS — F1021 Alcohol dependence, in remission: Secondary | ICD-10-CM

## 2016-01-11 DIAGNOSIS — Z9079 Acquired absence of other genital organ(s): Secondary | ICD-10-CM | POA: Diagnosis not present

## 2016-01-11 DIAGNOSIS — I10 Essential (primary) hypertension: Secondary | ICD-10-CM

## 2016-01-11 DIAGNOSIS — J9601 Acute respiratory failure with hypoxia: Principal | ICD-10-CM

## 2016-01-11 DIAGNOSIS — J209 Acute bronchitis, unspecified: Secondary | ICD-10-CM | POA: Diagnosis not present

## 2016-01-11 DIAGNOSIS — R0603 Acute respiratory distress: Secondary | ICD-10-CM | POA: Diagnosis not present

## 2016-01-11 DIAGNOSIS — I110 Hypertensive heart disease with heart failure: Secondary | ICD-10-CM | POA: Diagnosis not present

## 2016-01-11 DIAGNOSIS — J690 Pneumonitis due to inhalation of food and vomit: Secondary | ICD-10-CM

## 2016-01-11 DIAGNOSIS — Z79899 Other long term (current) drug therapy: Secondary | ICD-10-CM | POA: Diagnosis not present

## 2016-01-11 DIAGNOSIS — F17210 Nicotine dependence, cigarettes, uncomplicated: Secondary | ICD-10-CM | POA: Diagnosis not present

## 2016-01-11 DIAGNOSIS — Z23 Encounter for immunization: Secondary | ICD-10-CM | POA: Diagnosis not present

## 2016-01-11 DIAGNOSIS — I509 Heart failure, unspecified: Secondary | ICD-10-CM | POA: Diagnosis not present

## 2016-01-11 DIAGNOSIS — Z8546 Personal history of malignant neoplasm of prostate: Secondary | ICD-10-CM | POA: Diagnosis not present

## 2016-01-11 DIAGNOSIS — J45901 Unspecified asthma with (acute) exacerbation: Secondary | ICD-10-CM | POA: Diagnosis not present

## 2016-01-11 LAB — BASIC METABOLIC PANEL
Anion gap: 9 (ref 5–15)
BUN: 16 mg/dL (ref 6–20)
CO2: 24 mmol/L (ref 22–32)
Calcium: 9.1 mg/dL (ref 8.9–10.3)
Chloride: 105 mmol/L (ref 101–111)
Creatinine, Ser: 1.49 mg/dL — ABNORMAL HIGH (ref 0.61–1.24)
GFR calc Af Amer: 55 mL/min — ABNORMAL LOW (ref >60)
GFR calc non Af Amer: 47 mL/min — ABNORMAL LOW (ref >60)
Glucose, Bld: 158 mg/dL — ABNORMAL HIGH (ref 65–99)
Potassium: 3.7 mmol/L (ref 3.5–5.1)
Sodium: 138 mmol/L (ref 135–145)

## 2016-01-11 LAB — CBC
HCT: 35 % — ABNORMAL LOW (ref 39.0–52.0)
Hemoglobin: 12.3 g/dL — ABNORMAL LOW (ref 13.0–17.0)
MCH: 32.7 pg (ref 26.0–34.0)
MCHC: 35.1 g/dL (ref 30.0–36.0)
MCV: 93.1 fL (ref 78.0–100.0)
Platelets: 201 10*3/uL (ref 150–400)
RBC: 3.76 MIL/uL — ABNORMAL LOW (ref 4.22–5.81)
RDW: 12.9 % (ref 11.5–15.5)
WBC: 19.1 10*3/uL — ABNORMAL HIGH (ref 4.0–10.5)

## 2016-01-11 MED ORDER — INFLUENZA VAC SPLIT QUAD 0.5 ML IM SUSY
0.5000 mL | PREFILLED_SYRINGE | INTRAMUSCULAR | 0 refills | Status: AC
Start: 2016-01-12 — End: 2016-01-12

## 2016-01-11 MED ORDER — INFLUENZA VAC SPLIT QUAD 0.5 ML IM SUSY
0.5000 mL | PREFILLED_SYRINGE | INTRAMUSCULAR | Status: AC
Start: 2016-01-12 — End: 2016-01-11
  Administered 2016-01-11: 0.5 mL via INTRAMUSCULAR

## 2016-01-11 NOTE — Progress Notes (Signed)
NURSING PROGRESS NOTE  Leonard [name] [id number] Discharge Data: 01/11/2016 2:22 PM Attending Provider: Oval Linsey, MD PCP:No PCP Per Patient   [name] to be D/C'd Home per MD order.    All IV's will be discontinued and monitored for bleeding.  All belongings will be returned to patient for patient to take home.  Last Documented Vital Signs:  Blood pressure 139/70, pulse 85, temperature 98.4 F (36.9 C), resp. rate 19, height 5\' 7"  (1.702 m), weight 76.7 kg (169 lb), SpO2 99 %.  [name], MSN, RN, [employer]

## 2016-01-11 NOTE — Progress Notes (Signed)
Patient disconnected IV tubing from IV site. Said he wanted to walk down the hall and not bother anyone. I educated that he could cause self harm by doing that. Please ask for assistance in the future. Stated he would call next time. [name], MSN, RN, [employer]

## 2016-01-11 NOTE — Progress Notes (Signed)
   Subjective: Patient was evaluated this morning on rounds. He appeared in good spirits and states that his breathing has improved significantly overnight. He denies any shortness of breath when laying flat. He states his been able to walk the halls without difficulty. He states he feels that he is at his baseline health and is ready to go home.  Objective:  Vital signs in last 24 hours: Vitals:   01/10/16 2100 01/11/16 0144 01/11/16 0144 01/11/16 0429  BP: (!) 145/55  (!) 128/55 139/70  Pulse: 87  78 85  Resp: 19  19 19   Temp: 98.1 F (36.7 C)  98.5 F (36.9 C) 98.4 F (36.9 C)  TempSrc:      SpO2: 98% 97% 99% 99%  Weight:      Height:       Physical Exam  Constitutional: He is well-developed, well-nourished, and in no distress.  Cardiovascular: Normal rate, regular rhythm and normal heart sounds.  Exam reveals no gallop and no friction rub.   No murmur heard. Pulmonary/Chest: Effort normal and breath sounds normal. No respiratory distress. He has no wheezes. He has no rales.  Musculoskeletal: He exhibits no edema.  Skin: Skin is warm and dry.    Assessment/Plan:  Active Problems:   HTN (hypertension)   Acute respiratory failure with hypoxia (HCC)   Acute respiratory distress   Hyperglycemia  Aspiration pneumonitis Patient states today that he's had a 6-7 month history of difficulty swallowing when laying flat.  Today patient is satting well on room air and denies any shortness of breath. He states that overnight he was able to lay flat without becoming short of breath. On chest x-ray he no longer has vascular congestion and bibasilar airspace compared to admission chest x-ray.  This helps confirm possible aspiration pneumonitis versus a pneumonia or CHF.  Patient will likely need a swallow study done outpatient. -barium swallow outpatient  Possible Cardiomyopathy Patient has a heavy alcohol abuse history along with uncontrolled hypertension. BNP was slightly elevated on  admission and patient describes orthopnea for the past 10 days. An echo done outpatient will help further assess for cardiomyopathy. -Echo outpatient  Alcohol abuse Patient has a 3-4 alcoholic daily drink history.   Patient has not had a drink in the past 2 days and he denies any tremors, nausea, vomiting or heart palpitations.   -CIWA  Hypertension Patient had elevated blood pressures on admission, 212/120.  Pressures continue to improve and is currently 139/70.  Patient reports a high salt diet and wants to change his diet to help better control his blood pressure.  Patient does not have PCP and will need follow up in the internal medicine teaching program clinic.  If blood pressures continue to be uncontrolled can consider starting hydrochlorothiazide as an outpatient.   -monitor vitals - follow up in [facility]  Hyperglycemia Blood glucose on admission was 311, currently 158.  Unsure if this is secondary to steriods vs undiagnosed diabetes.   -A1C pending - follow up with [facility] [place]: Anticipated discharge today.  Valinda Party, DO 01/11/2016, 12:08 PM Pager: [phone]

## 2016-01-11 NOTE — Progress Notes (Signed)
Internal Medicine Attending  Date: 01/11/2016  Patient name: Leonard [name] Medical record number: [id number] Date of birth: October 31, 1950 Age: 65 y.o. Gender: male  I saw and evaluated the patient. I reviewed the resident's note by Dr. Heber [place] and I agree with the resident's findings and plans as documented in her progress note.  Please see my H&P dated 01/11/2016 for the specifics of my evaluation, assessment, and plan from earlier today.

## 2016-01-11 NOTE — H&P (Signed)
Internal Medicine Attending Admission Note Date: 01/11/2016  Patient name: Hamed Finkenbinder Medical record number: [id number] Date of birth: May 06, 1950 Age: 65 y.o. Gender: male  I saw and evaluated the patient. I reviewed the resident's note and I agree with the resident's findings and plan as documented in the resident's note.  Chief Complaint(s): Acute shortness of breath  History - key components related to admission:  Mr. Nordan is a 65 year old man with a history of hypertension, tobacco abuse, alcohol abuse, and prostate cancer status post a radical prostatectomy who presents with a 10 day history of shortness of breath that occurs only at night when lying down. He has noted recently that he has had to prop his head up in order to sleep comfortably at night because of shortness of breath when lying flat. He denies any shortness of breath during the day. He also denies chest pain, palpitations, fevers, shakes, chills, or recent sick contacts. On the night before admission he was celebrating his birthday and drank 3-4 alcoholic beverages. He went to sleep without difficulty but then awoke suddenly in the morning short of breath and having to sit up. When seen by EMS he was noted to have an O2 saturation of 84%. He was brought to the emergency department and noted to be in respiratory distress and immediately placed on BiPAP therapy. He was also noted to be hypertensive and have by basilar infiltrates on portable chest x-ray right greater than left. He was admitted to the internal medicine teaching service for further evaluation and care.  He admits to being noncompliant with his lisinopril because he feels the side effects were worse than the hypertension. Specifically he stated that it caused a cough and he was worried about its effects on the kidneys. When asked about the cough that seemed to be much more intermittent and paroxysmal rather than a true chronic ACE associated cough. He also admitted  that at times he felt things would go down the wrong pipe and this precipitated his coughing. Interestingly, he denies that he coughs with drinking or with eating although this is inconsistent from his previous statement.  On rounds the morning after admission he felt back to his baseline without any complaints whatsoever. A PA and lateral chest x-ray revealed complete resolution of the basilar infiltrates despite no diuresis since admission. This suggested an aspiration pneumonitis that quickly resolved without an infectious component. At this point he is interested in tobacco cessation, promises to cut back on drinking, realizes high salt is a problem, but is not interested in antihypertensive therapy. He would like a Pneumovax and a flu vaccination which will be provided prior to his discharge. He is also interested in getting his primary care through the internal medicine outpatient clinic.  Physical Exam - key components related to admission:  Vitals:   01/10/16 2100 01/11/16 0144 01/11/16 0144 01/11/16 0429  BP: (!) 145/55  (!) 128/55 139/70  Pulse: 87  78 85  Resp: 19  19 19   Temp: 98.1 F (36.7 C)  98.5 F (36.9 C) 98.4 F (36.9 C)  TempSrc:      SpO2: 98% 97% 99% 99%  Weight:      Height:       Gen.: Well-developed, well-nourished, man walking about the room in no acute distress. Lungs: Clear to auscultation bilaterally without wheezes, rhonchi, or rales. Extremities: Without edema.  Lab results:  Basic Metabolic Panel:  Recent Labs  01/10/16 0634 01/10/16 0636 01/11/16 0439  NA 138 139 138  K 3.6 3.7 3.7  CL 106 107 105  CO2 18*  --  24  GLUCOSE 306* 311* 158*  BUN 12 16 16   CREATININE 1.57* 1.40* 1.49*  CALCIUM 8.8*  --  9.1  MG 2.1  --   --    Liver Function Tests:  Recent Labs  01/10/16 0634  AST 101*  ALT 41  ALKPHOS 93  BILITOT 1.1  PROT 6.8  ALBUMIN 3.8   CBC:  Recent Labs  01/10/16 0634 01/10/16 0636 01/11/16 0439  WBC 12.6*  --  19.1*   NEUTROABS 6.0  --   --   HGB 13.9 14.6 12.3*  HCT 40.2 43.0 35.0*  MCV 94.8  --  93.1  PLT 216  --  201   Anemia Panel:  Recent Labs  01/10/16 1254  VITAMINB12 353   Misc. Labs:  Blood cultures 2 pending BNP 792.4 Lactic acid 4.76 -> 1.85 Procalcitonin 0.56 Hemoglobin A1c pending  Imaging results:  X-ray Chest Pa And Lateral  Result Date: 01/11/2016 CLINICAL DATA:  65 year old male with history of acute respiratory distress. EXAM: CHEST  2 VIEW COMPARISON:  Chest x-ray 01/10/2016. FINDINGS: Diffuse peribronchial cuffing. Lung volumes are normal. No consolidative airspace disease. No pleural effusions. No pneumothorax. No pulmonary nodule or mass noted. Pulmonary vasculature and the cardiomediastinal silhouette are within normal limits. Aortic atherosclerosis. IMPRESSION: 1. Aeration has improved, particularly in the right lower lobe, which suggests a resolving right lower lobe bronchopneumonia. Residual findings on today's study indicate a persistent background of acute bronchitis. 2. Aortic atherosclerosis. Electronically Signed   By: Vinnie Langton M.D.   On: 01/11/2016 08:33   Dg Chest Portable 1 View  Result Date: 01/10/2016 CLINICAL DATA:  Shortness of breath. EXAM: PORTABLE CHEST 1 VIEW COMPARISON:  05/02/2014. FINDINGS: 0654 hours. There are lower lung volumes. Even accounting for this and the portable AP nature of the current examination, there is new mild cardiac enlargement. There is vascular congestion with new bibasilar airspace opacities. No pneumothorax or significant pleural effusion identified. The bones appear unchanged. IMPRESSION: Mild cardiomegaly, vascular congestion and new bibasilar airspace opacities. Findings may reflect congestive heart failure or sequela of aspiration. Electronically Signed   By: Richardean Sale M.D.   On: 01/10/2016 08:08   I personally reviewed the portable chest x-ray from the emergency department yesterday and it demonstrated by  basilar infiltrates right greater than left. A repeat PA and lateral chest x-ray revealed complete resolution of those infiltrates despite no formal diuresis. This is therefore most consistent with an aspiration pneumonitis which quickly resolved and likely was not infectious in nature.  Other results:  EKG: Normal sinus rhythm at 81 bpm, normal axis, normal intervals, right atrial enlargement, no LVH by voltage, no significant Q waves, good R wave progression, no ST or T-wave changes. This ECG is unchanged from the previous ECG on 12/01/2015.  Assessment & Plan by Problem:  Mr. Dollins is a 65 year old man with a history of hypertension, tobacco abuse, alcohol abuse, and prostate cancer status post a radical prostatectomy who presents with a 10 day history of orthopnea and paroxysmal nocturnal dyspnea on the morning of admission. Chest x-rays were initially consistent with aspiration or volume overload, but without any diuresis the chest x-ray abnormalities resolved overnight suggesting an aspiration pneumonitis without infectious component. His clinical symptoms also resolved completely. Further history is consistent with symptoms of occasional aspiration. He was also found to be markedly hypertensive upon admission and raises the differential of flash pulmonary  edema from a markedly increased afterload or decompensation of a chronic heart failure given his recent orthopnea. Acutely his admission was related to an aspiration pneumonitis, but these other issues need to be addressed as an outpatient.  1) Aspiration pneumonitis: Completely resolved overnight. Upon follow-up, a barium swallow should be performed to assess for evidence of aspiration or significant reflux. If negative, a formal swallowing examination by speech therapy should be considered. Alcohol cessation is also an important component as this may cloud his sensorium and decrease his ability to appropriately protect his airway when  inebriated.  2) Possible cardiomyopathy: With long-standing hypertension he may have a hypertensive cardiomyopathy or a diastolic cardiomyopathy. It is also possible he has an alcoholic cardiomyopathy with systolic dysfunction. The 10 days of orthopnea would support heart failure. As an outpatient he will require further evaluation to initially begin with an echocardiogram to assess for left ventricular dysfunction or diastolic dysfunction.  3) Hypertension: Despite not being on any antihypertensives his blood pressure normalized during the hospitalization. I suspect he has salt sensitive hypertension given the difficulties he states he's had with blood pressure when he eats high salt foods. We will reassess his blood pressure at the follow-up visit in the outpatient center and if elevated consider initiating hydrochlorothiazide. He has a blood pressure cuff at home which he does not allow to use and we asked him to bring it to the clinic visit for education.  4) Disposition: He is being discharged home today with follow-up within the next week in the [facility].

## 2016-01-11 NOTE — Evaluation (Signed)
Physical Therapy Evaluation Patient Details Name: Leonard [name] MRN: [id number] DOB: [birth year] Today's Date: 01/11/2016   History of Present Illness  65 y.o. male admitted on 01/10/2016 to start zosyn for CAP/COPD exacerbation   Clinical Impression  Patient seen for mobility assessment. Patient independent with all asepcts of mobility, tolerated high level functional tasks and demonstrates no signs of LOB and DOE. Saturations >94% throughout activity. No further acute PT needs. Will sign off.    Follow Up Recommendations No PT follow up    Equipment Recommendations  None recommended by PT    Recommendations for Other Services       Precautions / Restrictions Precautions Precaution Comments: watch o2 saturations      Mobility  Bed Mobility Overal bed mobility: Independent                Transfers Overall transfer level: Independent                  Ambulation/Gait Ambulation/Gait assistance: Independent Ambulation Distance (Feet): 400 Feet Assistive device: None Gait Pattern/deviations: WFL(Within Functional Limits)        Stairs            Wheelchair Mobility    Modified Rankin (Stroke Patients Only)       Balance Overall balance assessment: Independent                                           Pertinent Vitals/Pain Pain Assessment: No/denies pain    Home Living Family/patient expects to be discharged to:: Private residence Living Arrangements: Alone Available Help at Discharge: Family;Friend(s) Type of Home: Apartment Home Access: Elevator     Home Layout: One level Home Equipment: None      Prior Function Level of Independence: Independent               Hand Dominance   Dominant Hand: Right    Extremity/Trunk Assessment   Upper Extremity Assessment: Overall WFL for tasks assessed           Lower Extremity Assessment: Overall WFL for tasks assessed         Communication   Communication: No difficulties  Cognition Arousal/Alertness: Awake/alert Behavior During Therapy: WFL for tasks assessed/performed Overall Cognitive Status: Within Functional Limits for tasks assessed                      General Comments      Exercises     Assessment/Plan    PT Assessment Patent does not need any further PT services  PT Problem List            PT Treatment Interventions      PT Goals (Current goals can be found in the Care Plan section)  Acute Rehab PT Goals PT Goal Formulation: All assessment and education complete, DC therapy    Frequency     Barriers to discharge        Co-evaluation               End of Session Equipment Utilized During Treatment: Gait belt Activity Tolerance: Patient tolerated treatment well Patient left: in chair;with call bell/phone within reach Nurse Communication: Mobility status         Time: [id number] PT Time Calculation (min) (ACUTE ONLY): 16 min   Charges:   PT Evaluation $PT Eval Low Complexity: 1 Procedure  PT G Codes:        Duncan Dull 2016/01/23, 10:17 AM Alben Deeds, PT DPT  [phone]

## 2016-01-12 LAB — HEMOGLOBIN A1C
Hgb A1c MFr Bld: 4.8 % (ref 4.8–5.6)
Mean Plasma Glucose: 91 mg/dL

## 2016-01-12 LAB — FOLATE RBC
Folate, Hemolysate: 288.3 ng/mL
Folate, RBC: 749 ng/mL (ref >498)
Hematocrit: 38.5 % (ref 37.5–51.0)

## 2016-01-13 DIAGNOSIS — N432 Other hydrocele: Secondary | ICD-10-CM | POA: Diagnosis not present

## 2016-01-14 NOTE — Discharge Summary (Signed)
Name: Leonard [name] MRN: [id number] DOB: 06-Sep-1950 65 y.o. PCP: No Pcp Per Patient  Date of Admission: 01/10/2016  6:08 AM Date of Discharge: 01/11/2016 Attending Physician: Oval Linsey MD  Discharge Diagnosis: 1. Aspiration Pneumonitis 2. Acute respiratory failure with hypoxia 3. Hypertension 4. History of alcohol abuse   Active Problems:   HTN (hypertension)   Acute respiratory failure with hypoxia (HCC)   Acute respiratory distress   Hyperglycemia   Aspiration pneumonia of right lower lobe due to gastric secretions [facility])   Essential hypertension   Discharge Medications:   Medication List    STOP taking these medications   HYDROcodone-acetaminophen 5-325 MG tablet Commonly known as:  NORCO/VICODIN     TAKE these medications   albuterol 108 (90 Base) MCG/ACT inhaler Commonly known as:  PROVENTIL HFA;VENTOLIN HFA Inhale 1 puff into the lungs every 6 (six) hours as needed for wheezing or shortness of breath.   docusate sodium 100 MG capsule Commonly known as:  COLACE Take 1 capsule (100 mg total) by mouth 2 (two) times daily.     ASK your doctor about these medications   Influenza vac split quadrivalent PF 0.5 ML injection Commonly known as:  FLUARIX Inject 0.5 mLs into the muscle tomorrow at 10 am. Ask about: Should I take this medication?       Disposition and follow-up:   [name] was discharged from [facility] in Stable condition.  At the hospital follow up visit please address:  1.  Uncontrolled hypertension, currently not on blood pressure medication.  He states he wants to try and cut his salt intake. Has blood pressure cuff at home he needs help learning how to use.  If blood pressure continues to be elevated would consider hydrochlorothiazide.  He reports a cough with the use of lisinopril.   2.  Labs / imaging needed at time of follow-up: Barium Swallow as patient reports having a 6-54month history of aspiration-if  negative will need formal swallowing examination by speech therapy.  Echo to assess for left ventricular dysfunction or diastolic dysfunction because he was describing a 10 day history of orthopnea prior to admission  3.  Pending labs/ test needing follow-up: none  Follow-up Appointments: Follow-up [place] Follow up in 5 day(s).   Contact information: [address] [place] [facility] Course by problem list: Active Problems:   HTN (hypertension)   Acute respiratory failure with hypoxia (HCC)   Acute respiratory distress   Hyperglycemia   Aspiration pneumonia of right lower lobe due to gastric secretions (HCC)   Essential hypertension   1. Aspiration pneumonitis  Patient reports a recent history of feeling things going down the wrong pipe.  He reports this has been occurring for the past 6-7 months and occurs mainly when he lays flat.  On the night before admission he woke up suddenly unable to breath. He was hypoxic in the ED and placed on bipap.  His chest x-ray showed vascular congestion and bibasilar airspace that cleared on the following day.  On admission he was started on azithromycin and zosyn and was discontinued on the following day after the diagnosis of aspiration pneumonitis was made.  Patient's shortness of breath and hypoxia resolved soon after he was admitted.  On day of discharge patient states he was back to his baseline health.      2.  Acute  respiratory failure with hypoxia In the ED patient was found to have a blood pressure of 212/120 and satting 85% on room air and was placed on bipap.  In the ED he was given salmeterol, magnesium, and albuterol.  Chest x-ray showed vascular congestion and bibasilar airspace.  On morning of admission patient was able to be taken off BiPAP and was stating well on room air for the rest of admission.  3. Hypertension  Patient has uncontrolled  hypertension and does not take blood pressure medication.  He has a 60 year pack smoking history and heavy alcohol history. BNP was elevated at 792.4 and bibasilar were noted crackles on exam.  During the history he reported orthopnea.  Patient will need Echo outpatient to assess for LV EF and diastolic dysfunction.  No lasix was given during admission as patient's symptoms resolved.  4. Alcohol abuse Patient has a 3-4 alcoholic daily drinking history.  Patient was placed on CIWA and did not exhibit withdrawal symptoms during admission.   Discharge Vitals:   BP 139/70 (BP Location: Left Arm)   Pulse 85   Temp 98.4 F (36.9 C)   Resp 19   Ht 5\' 7"  (1.702 m)   Wt 169 lb (76.7 kg)   SpO2 99%   BMI 26.47 kg/m   Pertinent Labs, Studies, and Procedures:  Chest x-ray  Discharge Instructions: Discharge Instructions    Call MD for:  difficulty breathing, headache or visual disturbances    Complete by:  As directed    Call MD for:  persistant nausea and vomiting    Complete by:  As directed    Call MD for:  temperature >100.4    Complete by:  As directed    Diet - low sodium heart healthy    Complete by:  As directed    Increase activity slowly    Complete by:  As directed       Signed: Valinda Party, DO 01/14/2016, 9:24 PM   Pager: [phone]

## 2016-01-15 LAB — CULTURE, BLOOD (ROUTINE X 2)
Culture: NO GROWTH
Culture: NO GROWTH

## 2016-01-19 ENCOUNTER — Ambulatory Visit: Payer: PPO

## 2016-01-19 ENCOUNTER — Emergency Department (HOSPITAL_COMMUNITY)
Admission: EM | Admit: 2016-01-19 | Discharge: 2016-01-19 | Disposition: A | Discharge status: Home / Self Care | Payer: PPO | Attending: Emergency Medicine | Admitting: Emergency Medicine

## 2016-01-19 ENCOUNTER — Ambulatory Visit (INDEPENDENT_AMBULATORY_CARE_PROVIDER_SITE_OTHER): Payer: PPO | Admitting: Pulmonary Disease

## 2016-01-19 ENCOUNTER — Encounter (HOSPITAL_COMMUNITY): Payer: Self-pay | Admitting: Emergency Medicine

## 2016-01-19 ENCOUNTER — Emergency Department (HOSPITAL_COMMUNITY): Payer: PPO

## 2016-01-19 VITALS — BP 159/81 | HR 94 | Temp 97.5°F | Ht 68.0 in | Wt 176.1 lb

## 2016-01-19 DIAGNOSIS — F17211 Nicotine dependence, cigarettes, in remission: Secondary | ICD-10-CM | POA: Diagnosis not present

## 2016-01-19 DIAGNOSIS — N182 Chronic kidney disease, stage 2 (mild): Secondary | ICD-10-CM

## 2016-01-19 DIAGNOSIS — I5020 Unspecified systolic (congestive) heart failure: Secondary | ICD-10-CM

## 2016-01-19 DIAGNOSIS — I129 Hypertensive chronic kidney disease with stage 1 through stage 4 chronic kidney disease, or unspecified chronic kidney disease: Secondary | ICD-10-CM

## 2016-01-19 DIAGNOSIS — I10 Essential (primary) hypertension: Secondary | ICD-10-CM

## 2016-01-19 DIAGNOSIS — Z8546 Personal history of malignant neoplasm of prostate: Secondary | ICD-10-CM | POA: Diagnosis not present

## 2016-01-19 DIAGNOSIS — Z87891 Personal history of nicotine dependence: Secondary | ICD-10-CM | POA: Diagnosis not present

## 2016-01-19 DIAGNOSIS — R0601 Orthopnea: Secondary | ICD-10-CM | POA: Diagnosis not present

## 2016-01-19 DIAGNOSIS — Z5189 Encounter for other specified aftercare: Secondary | ICD-10-CM

## 2016-01-19 DIAGNOSIS — J690 Pneumonitis due to inhalation of food and vomit: Secondary | ICD-10-CM

## 2016-01-19 DIAGNOSIS — N289 Disorder of kidney and ureter, unspecified: Secondary | ICD-10-CM | POA: Diagnosis not present

## 2016-01-19 DIAGNOSIS — I110 Hypertensive heart disease with heart failure: Secondary | ICD-10-CM | POA: Diagnosis not present

## 2016-01-19 DIAGNOSIS — J45909 Unspecified asthma, uncomplicated: Secondary | ICD-10-CM

## 2016-01-19 DIAGNOSIS — R0602 Shortness of breath: Secondary | ICD-10-CM

## 2016-01-19 DIAGNOSIS — I509 Heart failure, unspecified: Secondary | ICD-10-CM | POA: Diagnosis not present

## 2016-01-19 DIAGNOSIS — J4520 Mild intermittent asthma, uncomplicated: Secondary | ICD-10-CM

## 2016-01-19 DIAGNOSIS — Z79899 Other long term (current) drug therapy: Secondary | ICD-10-CM | POA: Insufficient documentation

## 2016-01-19 LAB — CBC WITH DIFFERENTIAL/PLATELET
Basophils Absolute: 0 10*3/uL (ref 0.0–0.1)
Basophils Relative: 0 %
Eosinophils Absolute: 0.2 10*3/uL (ref 0.0–0.7)
Eosinophils Relative: 2 %
HCT: 38.7 % — ABNORMAL LOW (ref 39.0–52.0)
Hemoglobin: 13.4 g/dL (ref 13.0–17.0)
Lymphocytes Relative: 26 %
Lymphs Abs: 2.3 10*3/uL (ref 0.7–4.0)
MCH: 32.8 pg (ref 26.0–34.0)
MCHC: 34.6 g/dL (ref 30.0–36.0)
MCV: 94.9 fL (ref 78.0–100.0)
Monocytes Absolute: 1 10*3/uL (ref 0.1–1.0)
Monocytes Relative: 11 %
Neutro Abs: 5.3 10*3/uL (ref 1.7–7.7)
Neutrophils Relative %: 61 %
Platelets: 205 10*3/uL (ref 150–400)
RBC: 4.08 MIL/uL — ABNORMAL LOW (ref 4.22–5.81)
RDW: 13.3 % (ref 11.5–15.5)
WBC: 8.9 10*3/uL (ref 4.0–10.5)

## 2016-01-19 LAB — BASIC METABOLIC PANEL
Anion gap: 9 (ref 5–15)
BUN: 13 mg/dL (ref 6–20)
CO2: 24 mmol/L (ref 22–32)
Calcium: 9.3 mg/dL (ref 8.9–10.3)
Chloride: 108 mmol/L (ref 101–111)
Creatinine, Ser: 1.28 mg/dL — ABNORMAL HIGH (ref 0.61–1.24)
GFR calc Af Amer: >60 mL/min (ref >60)
GFR calc non Af Amer: 57 mL/min — ABNORMAL LOW (ref >60)
Glucose, Bld: 104 mg/dL — ABNORMAL HIGH (ref 65–99)
Potassium: 4.1 mmol/L (ref 3.5–5.1)
Sodium: 141 mmol/L (ref 135–145)

## 2016-01-19 LAB — TROPONIN I: Troponin I: <0.03 ng/mL (ref <0.03)

## 2016-01-19 LAB — BRAIN NATRIURETIC PEPTIDE: B Natriuretic Peptide: 484 pg/mL — ABNORMAL HIGH (ref 0.0–100.0)

## 2016-01-19 MED ORDER — ALBUTEROL SULFATE (2.5 MG/3ML) 0.083% IN NEBU
INHALATION_SOLUTION | RESPIRATORY_TRACT | Status: AC
  Filled 2016-01-19: qty 6

## 2016-01-19 MED ORDER — ALBUTEROL SULFATE (2.5 MG/3ML) 0.083% IN NEBU: 2.5000 mg | INHALATION_SOLUTION | Freq: Four times a day (QID) | RESPIRATORY_TRACT | 2 refills | Status: AC | PRN

## 2016-01-19 MED ORDER — FUROSEMIDE 40 MG PO TABS: 40.0000 mg | ORAL_TABLET | Freq: Every day | ORAL | 0 refills | Status: DC

## 2016-01-19 MED ORDER — FUROSEMIDE 10 MG/ML IJ SOLN
40.0000 mg | Freq: Once | INTRAMUSCULAR | Status: AC
  Administered 2016-01-19: 40 mg via INTRAVENOUS
  Filled 2016-01-19: qty 4

## 2016-01-19 MED ORDER — ALBUTEROL SULFATE HFA 108 (90 BASE) MCG/ACT IN AERS
2.0000 | INHALATION_SPRAY | Freq: Four times a day (QID) | RESPIRATORY_TRACT | 2 refills | Status: DC | PRN
Start: 2016-01-19 — End: 2016-06-04

## 2016-01-19 MED ORDER — LOSARTAN POTASSIUM 50 MG PO TABS: 50.0000 mg | ORAL_TABLET | Freq: Every day | ORAL | 1 refills | Status: DC

## 2016-01-19 MED ORDER — ALBUTEROL SULFATE (2.5 MG/3ML) 0.083% IN NEBU
5.0000 mg | INHALATION_SOLUTION | Freq: Once | RESPIRATORY_TRACT | Status: AC
  Administered 2016-01-19: 5 mg via RESPIRATORY_TRACT

## 2016-01-19 NOTE — Assessment & Plan Note (Signed)
Assessment: BP uncontrolled today at 160/84 and 159/81. He was previously on lisinopril but stopped due to cough.   Plan: Start losartan 50mg  daily Will need BMP at follow up

## 2016-01-19 NOTE — Progress Notes (Signed)
   CC: hypertension  HPI:  [name] is a 65 year old man with history of HTN, asthma, prostate CA presenting for follow up of hypertension.  He was hospitalized 12/2 for hypertensive urgency and aspiration pneumonitis. He was discharged on 12/3. He was doing well up until 10pm, when he started feeling dyspneic again. He denies chest pain. Denies dyspnea on exertion. No chest pain with exertion. He had PND. No heartburn or acid reflux during the day. He has 2-3 pillow orthopnea.   He has occasional cough. No coughing with eating. Occasional phlegm that is white. He quit smoking 3 weeks ago.   He was seen in the ED this morning for dyspnea. BNP 484 and troponin negative. Troponin on 12/2 792. Hgb normal. No leukocytosis. CXR neg. EKG with normal sinus rhythm, T wave inversion in aVL and V2, possible Q wave in V2 when compared to previous EKG. He was given 40mg  IV Lasix and prescribed 40mg  Lasix PO.   He has had asthma since he was a child. He has flares once every 3-4 years. He has only been on an albuterol inhaler. He felt the albuterol nebulizer he received in the ED made him feel a lot better. He reports he had lung tests in [facility] and was told he did not have COPD but was borderline.    Past Medical History:  Diagnosis Date  . Asthma   . Hypertension   . Prostate cancer [facility])     Review of Systems:   No nausea/vomiting, abdominal pain No dysuria or hematuria  Physical Exam:  Vitals:   01/19/16 0914  BP: (!) 159/81  Pulse: 94  Temp: 97.5 F (36.4 C)  TempSrc: Oral  SpO2: 97%  Weight: 176 lb 1.6 oz (79.9 kg)  Height: 5\' 8"  (1.727 m)   General Apperance: NAD HEENT: Normocephalic, atraumatic, anicteric sclera Neck: Supple, trachea midline Lungs: Clear to auscultation bilaterally. No wheezes, rhonchi or rales. Breathing comfortably Heart: Regular rate and rhythm, no murmur/rub/gallop Abdomen: Soft, nontender, nondistended, no rebound/guarding Extremities: Warm  and well perfused, no edema Skin: No rashes or lesions Neurologic: Alert and interactive. No gross deficits.   Assessment & Plan:   See Encounters Tab for problem based charting.  Patient discussed with Dr. Lynnae January

## 2016-01-19 NOTE — ED Triage Notes (Signed)
Pt c/o shortness of breath since last Friday. Pt was admitted to the hospital at that time for a few days. Pt reports 4-5 hours after being discharged pt began having shortness of breath. Pt reports intermittent cough with white colored secretions. Pt reports that he quit smoking 3 weeks ago.

## 2016-01-19 NOTE — ED Notes (Signed)
Pt verbalizes understanding of discharge instructions and prescription information. Ambulatory at discharge but taken out via wheelchair. Pt had 200 cc output prior to leaving. NAD. VSS. 96% on RA.

## 2016-01-19 NOTE — Assessment & Plan Note (Signed)
Baseline creatinine around 1.3-1.4. Creatinine this morning 1.28 in ED. Will recheck his cr at follow up with addition of Lasix and losartan.

## 2016-01-19 NOTE — Assessment & Plan Note (Signed)
Assessment: History of asthma since childhood with intermittent albuterol use. He felt improvement in his dyspnea this morning with albuterol neb.  Plan: Rx for albuterol neb Also prescribed albuterol inhaler 2 puffs q6hr prn Given his smoking history will need to look at his PFTS. Consider repeating PFTs.

## 2016-01-19 NOTE — Patient Instructions (Signed)
We will order an echocardiogram to look at your heart Start taking losartan once a day for your blood pressure Continue taking Lasix daily Follow up in 1-2 weeks after your echocardiogram is done.

## 2016-01-19 NOTE — Assessment & Plan Note (Addendum)
Assessment: He was hospitalized for what was thought to be aspiration pneumonitis. He has been having 2-3 pillow orthopnea and PND. Denies chest pain. EKG with new T wave inversions in aVL and V2 when compared to EKG on Dec 2. Troponin was negative in the ED. BNP 484 this morning and was 792 on 12/2. Possible congestive heart failure (unknown at this point if systolic dysfunction but possible diastolic dysfunction given uncontrolled HTN). May also be related to his asthma.  Plan: Obtain 2D echo. Continue Lasix 40mg  PO daily. Follow up in 1-2 weeks.  ADDENDUM: Echo resulted. He has diffuse hypokinesis worse in the inferior wall. LV EF 40% to 45% and grade 1 diastolic dysfunction. Will refer to cardiology for further evaluation. He will also make an appt in [facility] to discuss medical management of heart failure. Discussed all of this over phone with patient.

## 2016-01-19 NOTE — ED Provider Notes (Signed)
Leonard [name] Provider Note   CSN: [id number] Arrival date & time: 01/19/16  0342     History   Chief Complaint Chief Complaint  Patient presents with  . Shortness of Breath    HPI Leonard [name] is a 65 y.o. male.  He relates difficulty breathing for the last 2 weeks. Difficulty is mainly at night when laying down. He feels better sitting up. He denies chest pain, heaviness, tightness, pressure. There has been a slight cough productive of some pale yellow sputum. She denies fever, chills, sweats. He was recently hospitalized for hypertensive urgency and there is report that he did have aspiration pneumonia. He states he felt fine when he was discharged the hospital, but 5 hours later he was having difficulty breathing.   The history is provided by the patient.  Shortness of Breath     Past Medical History:  Diagnosis Date  . Asthma   . Hypertension   . Prostate cancer [facility])     Patient Active Problem List   Diagnosis Date Noted  . Aspiration pneumonia of right lower lobe due to gastric secretions ([place])   . Essential hypertension   . Acute respiratory failure with hypoxia ([place]) 01/10/2016  . Acute respiratory distress 01/10/2016  . Hyperglycemia 01/10/2016  . Prostate cancer ([place]) 09/24/2013  . Malignant neoplasm of prostate ([place]) 07/03/2013  . HTN (hypertension) 07/03/2013    Past Surgical History:  Procedure Laterality Date  . APPENDECTOMY    . HYDROCELE EXCISION Left 12/01/2015   Procedure: REPAIR OF LEFT HYDROCELE;  Surgeon: Raynelle Bring, MD;  Location: [facility] ORS;  Service: Urology;  Laterality: Left;  . LYMPHADENECTOMY Bilateral 09/24/2013   Procedure: LYMPHADENECTOMY;  Surgeon: Raynelle Bring, MD;  Location: [facility] ORS;  Service: Urology;  Laterality: Bilateral;  . PROSTATE BIOPSY    . ROBOT ASSISTED LAPAROSCOPIC RADICAL PROSTATECTOMY N/A 09/24/2013   Procedure: ROBOTIC ASSISTED LAPAROSCOPIC RADICAL PROSTATECTOMY LEVEL 2;  Surgeon: Raynelle Bring, MD;   Location: [facility]S;  Service: Urology;  Laterality: N/A;       Home Medications    Prior to Admission medications   Medication Sig Start Date End Date Taking? Authorizing Provider  albuterol (PROVENTIL HFA;VENTOLIN HFA) 108 (90 Base) MCG/ACT inhaler Inhale 1 puff into the lungs every 6 (six) hours as needed for wheezing or shortness of breath.    Historical Provider, MD  docusate sodium (COLACE) 100 MG capsule Take 1 capsule (100 mg total) by mouth 2 (two) times daily. 12/01/15   Raynelle Bring, MD    Family History Family History  Problem Relation Age of Onset  . Cancer Neg Hx     Social History Social History  Substance Use Topics  . Smoking status: Former Smoker    Packs/day: 2.00    Years: 30.00    Types: Cigarettes    Quit date: 12/24/2015  . Smokeless tobacco: Never Used  . Alcohol use Yes     Comment: 5 glasses of alcohol per day     Allergies   Patient has no known allergies.   Review of Systems Review of Systems  Respiratory: Positive for shortness of breath.   All other systems reviewed and are negative.    Physical Exam Updated Vital Signs BP 151/97 (BP Location: Right Arm)   Pulse 97   Temp 97.4 F (36.3 C) (Oral)   Resp 14   Ht 5\' 7"  (1.702 m)   Wt 175 lb (79.4 kg)   SpO2 96%   BMI 27.41 kg/m   Physical  Exam  Nursing note and vitals reviewed.  65 year old male, resting comfortably and in no acute distress. Vital signs are Significant for hypertension. Oxygen saturation is 96%, which is normal. Head is normocephalic and atraumatic. PERRLA, EOMI. Oropharynx is clear. Neck is nontender and supple without adenopathy. JVD is present at 7. Back is nontender and there is no CVA tenderness. Lungs have by basilar rales without wheezes or rhonchi. Chest is nontender. Heart has regular rate and rhythm without murmur. Abdomen is soft, flat, nontender without masses or hepatosplenomegaly and peristalsis is normoactive. Extremities have no cyanosis  or edema, full range of motion is present. Skin is warm and dry without rash. Neurologic: Mental status is normal, cranial nerves are intact, there are no motor or sensory deficits.  ED Treatments / Results  Labs (all labs ordered are listed, but only abnormal results are displayed) Labs Reviewed  BASIC METABOLIC PANEL - Abnormal; Notable for the following:       Result Value   Glucose, Bld 104 (*)    Creatinine, Ser 1.28 (*)    GFR calc non Af Amer 57 (*)    All other components within normal limits  BRAIN NATRIURETIC PEPTIDE - Abnormal; Notable for the following:    B Natriuretic Peptide 484.0 (*)    All other components within normal limits  CBC WITH DIFFERENTIAL/PLATELET - Abnormal; Notable for the following:    RBC 4.08 (*)    HCT 38.7 (*)    All other components within normal limits  TROPONIN I    EKG  EKG Interpretation  Date/Time:  Monday January 19 2016 03:51:41 EST Ventricular Rate:  97 PR Interval:  150 QRS Duration: 94 QT Interval:  364 QTC Calculation: 462 R Axis:   44 Text Interpretation:  Normal sinus rhythm Biatrial enlargement Possible Anteroseptal infarct , age undetermined Abnormal ECG When compared with ECG of 01/11/2016, No significant change was found Confirmed by [facility]  MD, Makenze Ellett ([id number]) on 01/19/2016 5:38:50 AM       Radiology Dg Chest 2 View  Result Date: 01/19/2016 CLINICAL DATA:  65 year old male with shortness of breath EXAM: CHEST  2 VIEW COMPARISON:  Chest radiograph dated 01/11/2016 FINDINGS: There is no focal consolidation, pleural effusion, or pneumothorax. Mild diffuse interstitial coarsening similar to prior radiograph. Probable mild bronchiectatic changes. The cardiac silhouette is within normal limits. No acute osseous pathology identified. IMPRESSION: No active cardiopulmonary disease. Electronically Signed   By: Anner Crete M.D.   On: 01/19/2016 04:48    Procedures Procedures (including critical care time)  Medications  Ordered in ED Medications  furosemide (LASIX) injection 40 mg (not administered)  albuterol (PROVENTIL) (2.5 MG/3ML) 0.083% nebulizer solution 5 mg (5 mg Nebulization Given 01/19/16 0353)     Initial Impression / Assessment and Plan / ED Course  I have reviewed the triage vital signs and the nursing notes.  Pertinent labs & imaging results that were available during my care of the patient were reviewed by me and considered in my medical decision making (see chart for details).  Clinical Course    Dyspnea with physical findings suggestive of congestive heart failure. Chest x-ray does not show evidence of heart failure but will check BNP.  BNP has come back elevated. Renal insufficiency is present and not changed from baseline. He is given a dose of furosemide and discharged with a prescription for furosemide. He has an appointment to establish primary care at internal medicine clinic at 9:30 AM today. He is discharged and  instructed to keep that appointment.  Final Clinical Impressions(s) / ED Diagnoses   Final diagnoses:  Acute congestive heart failure, unspecified congestive heart failure type (HCC)  Renal insufficiency    New Prescriptions New Prescriptions   FUROSEMIDE (LASIX) 40 MG TABLET    Take 1 tablet (40 mg total) by mouth daily.     Delora Fuel, MD [id number]

## 2016-01-19 NOTE — Assessment & Plan Note (Signed)
Consider barium swallow after cardiac evaluation.

## 2016-01-23 NOTE — Progress Notes (Signed)
Internal Medicine Clinic Attending  Case discussed with Dr. Krall at the time of the visit.  We reviewed the resident's history and exam and pertinent patient test results.  I agree with the assessment, diagnosis, and plan of care documented in the resident's note.  

## 2016-01-30 ENCOUNTER — Telehealth: Payer: Self-pay | Admitting: Internal Medicine

## 2016-01-30 NOTE — Telephone Encounter (Signed)
APT. REMINDER CALL, LMTCB °

## 2016-02-04 ENCOUNTER — Ambulatory Visit (INDEPENDENT_AMBULATORY_CARE_PROVIDER_SITE_OTHER): Payer: PPO | Admitting: Pulmonary Disease

## 2016-02-04 VITALS — BP 140/77 | HR 77 | Temp 97.9°F | Ht 68.0 in | Wt 180.0 lb

## 2016-02-04 DIAGNOSIS — I10 Essential (primary) hypertension: Secondary | ICD-10-CM | POA: Diagnosis not present

## 2016-02-04 DIAGNOSIS — Z79899 Other long term (current) drug therapy: Secondary | ICD-10-CM | POA: Diagnosis not present

## 2016-02-04 DIAGNOSIS — R21 Rash and other nonspecific skin eruption: Secondary | ICD-10-CM | POA: Insufficient documentation

## 2016-02-04 DIAGNOSIS — J45909 Unspecified asthma, uncomplicated: Secondary | ICD-10-CM | POA: Diagnosis not present

## 2016-02-04 DIAGNOSIS — Z87891 Personal history of nicotine dependence: Secondary | ICD-10-CM

## 2016-02-04 DIAGNOSIS — J4520 Mild intermittent asthma, uncomplicated: Secondary | ICD-10-CM

## 2016-02-04 NOTE — Assessment & Plan Note (Signed)
Assessment: BP better controlled now at 140/77  Plan: Continue losartan 50mg  daily.

## 2016-02-04 NOTE — Assessment & Plan Note (Signed)
Assessment: Erythematous, flat morbiliform lesion on abdomen one week after start Lasix and losartan. Possible drug rash.  Plan:  Stop Lasix  If rash does not resolve after one week, stop losartan Follow up in 2-3 weeks

## 2016-02-04 NOTE — Assessment & Plan Note (Signed)
Assessment: He uses his inhaler about 8 times in a week. Discussed with him possibly starting a steroid daily inhaler. He would like to wait until next visit to decide on this. Also discussed potential cost benefit of this.  Plan: Asked patient to keep track of albuterol usage Consider step up therapy at next visit

## 2016-02-04 NOTE — Progress Notes (Signed)
Internal Medicine Clinic Attending  Case discussed with Dr. Krall soon after the resident saw the patient.  We reviewed the resident's history and exam and pertinent patient test results.  I agree with the assessment, diagnosis, and plan of care documented in the resident's note. 

## 2016-02-04 NOTE — Progress Notes (Signed)
   CC: hypertension follow up  HPI:  [name] is a 65 year old man with history of HTN, asthma, prostate CA presenting for follow up of hypertension.  His breathing is good. He has some dyspnea every now and then and uses his inhaler. He uses his inhaler about 8 times in a week. No LE edema. He is able to lie flat at night - uses one flat pillow. No PND.   He has developed a rash his abdomen and legs that developed a week after starting Lasix and losartan.   Past Medical History:  Diagnosis Date  . Asthma   . Hypertension   . Prostate cancer [facility])     Review of Systems:   No fevers or chills No chest pain No nausea/vomiting  Physical Exam:  Vitals:   02/04/16 0841  BP: 140/77  Pulse: 77  Temp: 97.9 F (36.6 C)  TempSrc: Oral  SpO2: 98%  Weight: 180 lb (81.6 kg)  Height: 5\' 8"  (1.727 m)   General Apperance: NAD HEENT: Normocephalic, atraumatic, anicteric sclera Neck: Supple, trachea midline Lungs: Clear to auscultation bilaterally. No wheezes, rhonchi or rales. Breathing comfortably Heart: Regular rate and rhythm, no murmur/rub/gallop Abdomen: Soft, nontender, nondistended, no rebound/guarding Extremities: Warm and well perfused, no edema Skin: Erythematous, flat morbiliform rash on abdomen Neurologic: Alert and interactive. No gross deficits.  Assessment & Plan:   See Encounters Tab for problem based charting.  Patient discussed with Dr. Daryll Drown

## 2016-02-04 NOTE — Patient Instructions (Addendum)
Stop taking furosemide (Lasix) If your rash does not go away after one week, stop taking the losartan Follow up in 2-3 weeks Please call if your shortness of breath gets worse or if you develop new unexplained symptoms Please keep track of how often you are using your inhaler/nebulizer

## 2016-02-05 LAB — BMP8+ANION GAP
Anion Gap: 18 mmol/L (ref 10.0–18.0)
BUN/Creatinine Ratio: 14 (ref 10–24)
BUN: 20 mg/dL (ref 8–27)
CO2: 25 mmol/L (ref 18–29)
Calcium: 9.8 mg/dL (ref 8.6–10.2)
Chloride: 100 mmol/L (ref 96–106)
Creatinine, Ser: 1.4 mg/dL — ABNORMAL HIGH (ref 0.76–1.27)
GFR calc Af Amer: 60 mL/min/{1.73_m2} (ref >59)
GFR calc non Af Amer: 52 mL/min/{1.73_m2} — ABNORMAL LOW (ref >59)
Glucose: 93 mg/dL (ref 65–99)
Potassium: 4.2 mmol/L (ref 3.5–5.2)
Sodium: 143 mmol/L (ref 134–144)

## 2016-02-18 ENCOUNTER — Telehealth: Payer: Self-pay

## 2016-02-18 ENCOUNTER — Ambulatory Visit (INDEPENDENT_AMBULATORY_CARE_PROVIDER_SITE_OTHER): Payer: PPO | Admitting: Internal Medicine

## 2016-02-18 VITALS — BP 160/93 | HR 82 | Temp 98.0°F | Ht 68.0 in | Wt 183.7 lb

## 2016-02-18 DIAGNOSIS — C61 Malignant neoplasm of prostate: Secondary | ICD-10-CM | POA: Diagnosis not present

## 2016-02-18 DIAGNOSIS — F17211 Nicotine dependence, cigarettes, in remission: Secondary | ICD-10-CM | POA: Diagnosis not present

## 2016-02-18 DIAGNOSIS — J4520 Mild intermittent asthma, uncomplicated: Secondary | ICD-10-CM

## 2016-02-18 DIAGNOSIS — Z79899 Other long term (current) drug therapy: Secondary | ICD-10-CM

## 2016-02-18 DIAGNOSIS — Z9079 Acquired absence of other genital organ(s): Secondary | ICD-10-CM

## 2016-02-18 DIAGNOSIS — H18413 Arcus senilis, bilateral: Secondary | ICD-10-CM | POA: Diagnosis not present

## 2016-02-18 DIAGNOSIS — I10 Essential (primary) hypertension: Secondary | ICD-10-CM | POA: Diagnosis not present

## 2016-02-18 DIAGNOSIS — R21 Rash and other nonspecific skin eruption: Secondary | ICD-10-CM

## 2016-02-18 DIAGNOSIS — J45909 Unspecified asthma, uncomplicated: Secondary | ICD-10-CM

## 2016-02-18 DIAGNOSIS — H401133 Primary open-angle glaucoma, bilateral, severe stage: Secondary | ICD-10-CM | POA: Diagnosis not present

## 2016-02-18 MED ORDER — TRIAMTERENE-HCTZ 37.5-25 MG PO CAPS: 1.0000 | ORAL_CAPSULE | Freq: Every day | ORAL | 1 refills | Status: DC

## 2016-02-18 MED ORDER — CETIRIZINE HCL 1 MG/ML PO SYRP: 10.0000 mg | ORAL_SOLUTION | Freq: Every day | ORAL | 12 refills | Status: DC

## 2016-02-18 MED ORDER — TRIAMTERENE-HCTZ 50-25 MG PO CAPS: 1.0000 | ORAL_CAPSULE | Freq: Every day | ORAL | 0 refills | Status: DC

## 2016-02-18 NOTE — Telephone Encounter (Signed)
Merrilee Seashore from [employer] needs to speak with a nurse regarding triamterene-hydrochlorothiazide (DYAZIDE) 50-25 MG capsule.

## 2016-02-18 NOTE — Telephone Encounter (Signed)
Prescription changed to Dyazide 37.5-25 mg. I'm not too worried about the specific dose of triamterene but I do think she'll benefit from this diuretic therapy especially since he stopped the previous he recommended Lasix and was still hypertensive today.

## 2016-02-18 NOTE — Progress Notes (Signed)
   CC: Shortness of breath, health maintenance  HPI:  Mr.Zyren Shehan is a 66 y.o. with a history of hypertension, asthma, prostate cancer s/p resection in remission here today for follow up of his hypertension and for shortness of breath.  He denies any body aches or other acute problems right now along with his dyspnea. He is concerned about his heart due to his worsening dyspnea on exertion. He is using his albuterol inhaler more than daily for active symptoms although does not notice wheezing. He wakes up in the mornings around 3 or 4 am often feeling out of breath with coughing. He is continuously congested and coughs up mucous regularly. He denies significant orthopnea and sleeps with 1 pillow.  He is following up with urology clinic regularly after his prostate cancer and resection, last visit reports about 1.5 month ago. His PSA is low according to his last visit with them.  At his last visit on 12/27 he discussed an abdominal and leg rash that had started after beginning new antihypertensive medicines.He stopped his lasix after his visit and did not notice an improvement in his rash. He has improvement in his rash on abdomen and legs after stopping his losartan one week ago. He also had some improvement to this rash with taking benadryl.  See problem based assessment and plan below for additional details  Past Medical History:  Diagnosis Date  . Asthma   . Hypertension   . Prostate cancer [facility])     Review of Systems:  Review of Systems  Constitutional: Negative for fever.  HENT: Positive for congestion. Negative for sore throat.   Respiratory: Positive for cough, sputum production and shortness of breath. Negative for wheezing.   Gastrointestinal: Negative for diarrhea.  Genitourinary: Negative for frequency.  Skin: Positive for itching and rash.  Neurological: Negative for dizziness and headaches.    Physical Exam: Physical Exam  Constitutional: He is oriented to  person, place, and time and well-developed, well-nourished, and in no distress. No distress.  HENT:  Head: Normocephalic and atraumatic.  Mouth/Throat: Oropharynx is clear and moist. No oropharyngeal exudate.  Eyes: Conjunctivae are normal.  Cardiovascular: Normal rate and regular rhythm.   Pulmonary/Chest: Effort normal and breath sounds normal.  Lymphadenopathy:    He has no cervical adenopathy.  Neurological: He is alert and oriented to person, place, and time.  Skin: Skin is warm and dry. No rash noted.  Psychiatric: Mood and affect normal.    Vitals:   02/18/16 0940  BP: (!) 160/93  Pulse: 82  Temp: 98 F (36.7 C)  TempSrc: Oral  SpO2: 98%  Weight: 183 lb 11.2 oz (83.3 kg)  Height: 5\' 8"  (1.727 m)    Assessment & Plan:   See Encounters Tab for problem based charting.  Patient discussed with Dr. Angelia Mould

## 2016-02-18 NOTE — Patient Instructions (Signed)
It was a pleasure to see you today Leonard [name].  I'm sorry you are having such trouble taking the losartan for your blood pressure. I prescribed hydrochlorothiazide 25 mg as a different blood pressure medicine for you to take. We will need to see you again in 2-3 weeks after starting this medicine to make sure it is having a good effect and your kidney function is good.  For your coughing and shortness of breath I think a large part of this is your allergies worsening your baseline asthma. Before starting new inhalers for asthma I want to try treating these allergy symptoms. I prescribed a cetirizine (Zyrtec) oral liquid you can take 10 mL per day for your symptoms. If you have trouble taking the liquid or does not adequately covered by insurance I recommended the over-the-counter cetirizine 10 mg daily or loratadine 10 mg daily.

## 2016-02-18 NOTE — Telephone Encounter (Signed)
Per pharmacist- Dyazide 50-25 has been discontinued by insurance, has 37.5-25 mg cap available if you want to switch & can be sent back to [facility]. Pls advise, thanks!

## 2016-02-19 ENCOUNTER — Other Ambulatory Visit: Payer: Self-pay | Admitting: *Deleted

## 2016-02-20 NOTE — Assessment & Plan Note (Signed)
A: He reports about daily albuterol use which is less controlled than ideal for no maintenance therapy. However right now he has a lot of upper airway complaints so his coughing and sputum production might be exacerbating his respiratory symptoms.  P: - Instructed Leonard [name] on isotonic saline nasal irrigation - Start cetirizine 10mg  daily - If s/s not improved with decreasing his congestion and drainage then adding maintenance inhaler would be appropriate for persistent symptoms

## 2016-02-20 NOTE — Assessment & Plan Note (Signed)
A: His blood pressure is uncontrolled today after stopping his losartan that he thinks caused his rash based on the timing of onset and improving after he stopped the medicine. He was previously taken off lisinopril due to cough. I am not sure that he needs a diuretic since I see no edema on exam today and he reports no orthopnea or weight fluctuation.  P: Hold losartan 50mg  Start HCTZ 25mg  today

## 2016-02-20 NOTE — Assessment & Plan Note (Signed)
Rash is pretty much resolved I do not see anything obvious on physical exam. Per the patient the timing lines up with a few days after stopping losartan that is suggestive of a drug reaction. This was a mild reaction so re-challenging in the future would be a reasonable option if needed.

## 2016-02-24 NOTE — Progress Notes (Signed)
Internal Medicine Clinic Attending  Case discussed with Dr. Rice at the time of the visit.  We reviewed the resident's history and exam and pertinent patient test results.  I agree with the assessment, diagnosis, and plan of care documented in the resident's note.  

## 2016-03-04 DIAGNOSIS — H401122 Primary open-angle glaucoma, left eye, moderate stage: Secondary | ICD-10-CM | POA: Diagnosis not present

## 2016-03-04 DIAGNOSIS — H25813 Combined forms of age-related cataract, bilateral: Secondary | ICD-10-CM | POA: Diagnosis not present

## 2016-03-04 DIAGNOSIS — H401113 Primary open-angle glaucoma, right eye, severe stage: Secondary | ICD-10-CM | POA: Diagnosis not present

## 2016-03-10 ENCOUNTER — Ambulatory Visit (INDEPENDENT_AMBULATORY_CARE_PROVIDER_SITE_OTHER): Payer: PPO | Admitting: Internal Medicine

## 2016-03-10 VITALS — BP 159/80 | HR 77 | Temp 97.8°F | Wt 185.2 lb

## 2016-03-10 DIAGNOSIS — J3489 Other specified disorders of nose and nasal sinuses: Secondary | ICD-10-CM

## 2016-03-10 DIAGNOSIS — Z79899 Other long term (current) drug therapy: Secondary | ICD-10-CM

## 2016-03-10 DIAGNOSIS — I10 Essential (primary) hypertension: Secondary | ICD-10-CM

## 2016-03-10 DIAGNOSIS — J4530 Mild persistent asthma, uncomplicated: Secondary | ICD-10-CM | POA: Diagnosis not present

## 2016-03-10 DIAGNOSIS — F17211 Nicotine dependence, cigarettes, in remission: Secondary | ICD-10-CM | POA: Diagnosis not present

## 2016-03-10 MED ORDER — CHLORPHENIRAMINE MALEATE 4 MG PO TABS: 4.0000 mg | ORAL_TABLET | Freq: Three times a day (TID) | ORAL | 0 refills | Status: DC | PRN

## 2016-03-10 MED ORDER — BUDESONIDE 90 MCG/ACT IN AEPB: 1.0000 | INHALATION_SPRAY | Freq: Two times a day (BID) | RESPIRATORY_TRACT | 2 refills | Status: DC

## 2016-03-10 NOTE — Progress Notes (Signed)
   CC: Congestion, shortness of breath  HPI:  [name] is a 66 y.o. man here for a follow up of his continued sinus congestion and also his change in blood pressure medications.   See problem based assessment and plan below for additional details  Past Medical History:  Diagnosis Date  . Asthma   . Hypertension   . Prostate cancer [facility])     Review of Systems:  Review of Systems  HENT: Negative for hearing loss.   Eyes: Negative for blurred vision.  Respiratory: Positive for cough and sputum production. Negative for shortness of breath.   Cardiovascular: Negative for chest pain and leg swelling.  Gastrointestinal: Negative for diarrhea.  Skin: Negative for rash.    Physical Exam: Physical Exam  Constitutional: He is well-developed, well-nourished, and in no distress.  HENT:  Head: Normocephalic.  Mouth/Throat: No oropharyngeal exudate.  Eyes: Conjunctivae are normal.  Cardiovascular: Normal rate and regular rhythm.   Pulmonary/Chest: Effort normal and breath sounds normal.  Musculoskeletal: He exhibits no edema.  Lymphadenopathy:    He has no cervical adenopathy.    Vitals:   03/10/16 0922  BP: (!) 159/80  Pulse: 77  Temp: 97.8 F (36.6 C)  TempSrc: Oral  SpO2: 99%  Weight: 185 lb 3.2 oz (84 kg)    Assessment & Plan:   See Encounters Tab for problem based charting.  Patient discussed with Dr. Dareen Piano

## 2016-03-10 NOTE — Patient Instructions (Signed)
It was a pleasure to see you today Mr. Calafiore.  I think you are not getting much benefit with the cetirizine so I recommend stopping that medicine. Try taking chlorpheniramine 4mg  2 or 3 times daily as needed for your continued congestion. We can continue this for a while if it is helping. The most common side effect of this is drowsiness so do not drive when first taking it until you know it is not making you excessively sleepy.  I also prescribed an inhaler you can take every day while having symptoms to decrease your asthma symptoms. You can still use the albuterol inhaler as needed for shortness of breath.  We will need to see you again in a few weeks to make sure this is improving and follow up your blood pressure.

## 2016-03-11 LAB — BMP8+ANION GAP
Anion Gap: 13 mmol/L (ref 10.0–18.0)
BUN/Creatinine Ratio: 12 (ref 10–24)
BUN: 16 mg/dL (ref 8–27)
CO2: 27 mmol/L (ref 18–29)
Calcium: 9.5 mg/dL (ref 8.6–10.2)
Chloride: 103 mmol/L (ref 96–106)
Creatinine, Ser: 1.31 mg/dL — ABNORMAL HIGH (ref 0.76–1.27)
GFR calc Af Amer: 65 mL/min/{1.73_m2} (ref >59)
GFR calc non Af Amer: 56 mL/min/{1.73_m2} — ABNORMAL LOW (ref >59)
Glucose: 102 mg/dL — ABNORMAL HIGH (ref 65–99)
Potassium: 4.6 mmol/L (ref 3.5–5.2)
Sodium: 143 mmol/L (ref 134–144)

## 2016-03-12 ENCOUNTER — Telehealth: Payer: Self-pay | Admitting: Internal Medicine

## 2016-03-12 NOTE — Telephone Encounter (Addendum)
Call to [employer] on yesterday.  Pulmicort is not on their formulary.  Alternative medications such as Asmanex and Arnuity Ellipta have a $45.00 co-pay.  Call to patient today to ask if he can afford the $45 copay if the medication is changed.  Message left for patient to call the Clinics.  Sander Nephew, RN 03/12/2016 11:30 AM  Fax from patient's insurance -PA approved for Pulmicort starting 03/15/2016 thru 02/07/2017.  Sander Nephew, RN 03/17/2016 4:08 PM

## 2016-03-15 NOTE — Progress Notes (Signed)
Internal Medicine Clinic Attending  Case discussed with Dr. Rice at the time of the visit.  We reviewed the resident's history and exam and pertinent patient test results.  I agree with the assessment, diagnosis, and plan of care documented in the resident's note.  

## 2016-03-15 NOTE — Assessment & Plan Note (Signed)
HPI: He is taking dyazide daily without complication since 3 weeks ago. There has been no redevelopment of peripheral edema.He does not monitor blood pressures at home.  A: Blood pressure is moderately uncontrolled today at 159/80 He does not want to start additional medication today due to having problems with ongoing congestion that he wants addressed. He appreciates that we are unlikely to see adequate improvement from the current control without a second antihypertensive medication. I would recommend a calcium channel blocker as the next agent to try given his suggestive history of rash on losartan.  P: -Bmet today -Continue Dyazide 37.5-25mg  -F/U within a month

## 2016-03-15 NOTE — Assessment & Plan Note (Signed)
HPI: Upper airway symptoms are not much improved with use of daily cetirizine and nasal sprays. He continues to have increased cough and breathing complaints and is using his albuterol inhaler daily. He is not having any problems with fevers sinus pain or GI complaints. Symptoms continue to be worst at night.  A: Sinus congestion and rhinitis minimally exacerbating his currently mild, persistent asthma  P: -Hold cetirizine, trial of chlorpheniramine 2-3 times daily as needed for his congestion -Continue sinus hygiene as tolerated -At this point recommend a daily inhaled corticosteroid due to his persistent symptoms, the budesonide or an alternative whichever is most accessible

## 2016-03-27 IMAGING — US US ABDOMEN COMPLETE
1 series · 14 of 25 positions shown · non-contrast
Comparison: 08/08/2013 CT

CLINICAL DATA: Right upper quadrant abdominal pain.

EXAM:
ULTRASOUND ABDOMEN COMPLETE

[Series 1: us abdomen complete · 0.24mm/px · 14 of 66 slices shown]
[im 1/66]
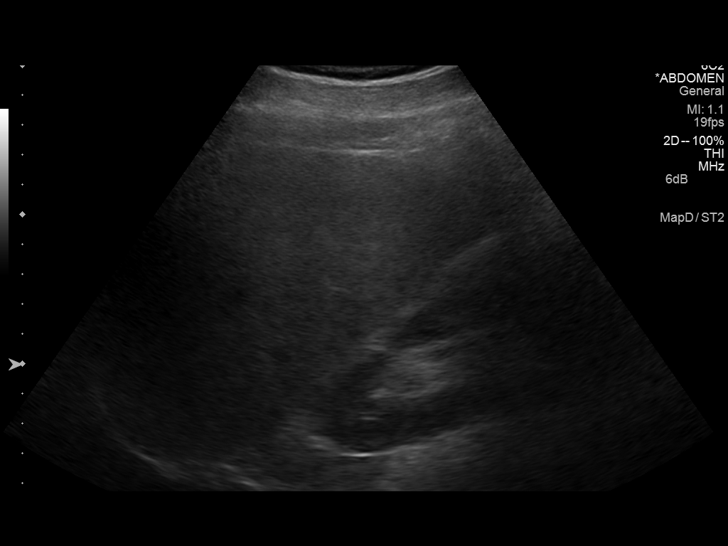
[im 6/66]
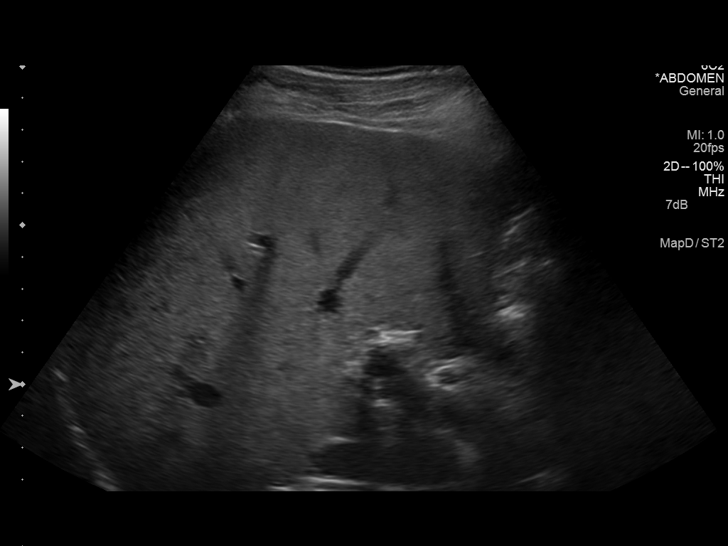
[im 11/66]
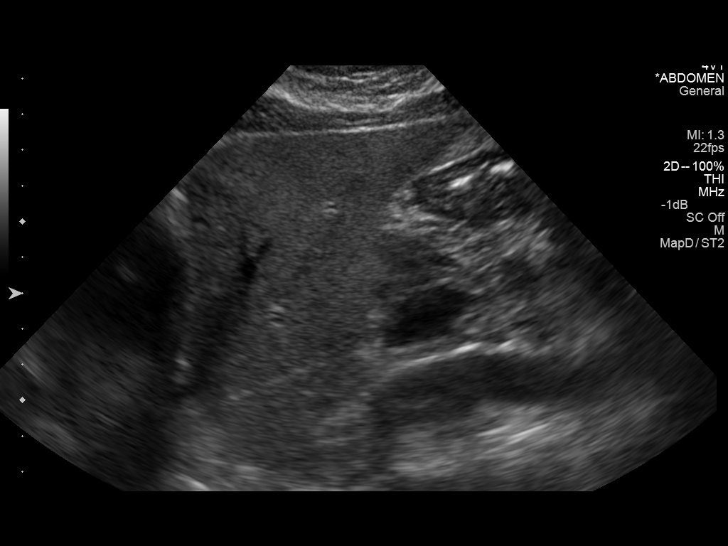
[im 17/66]
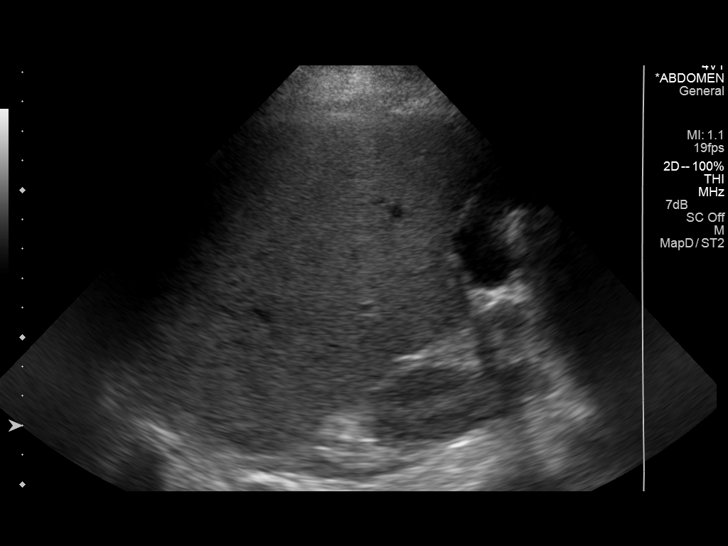
[im 22/66]
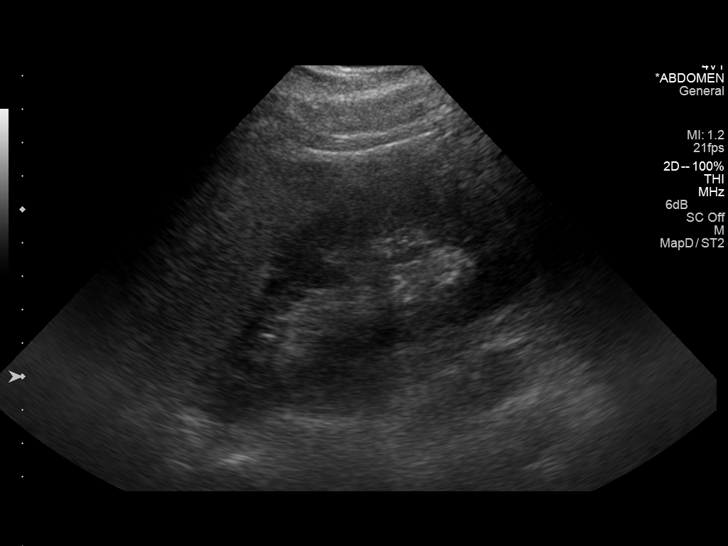
[im 25/66]
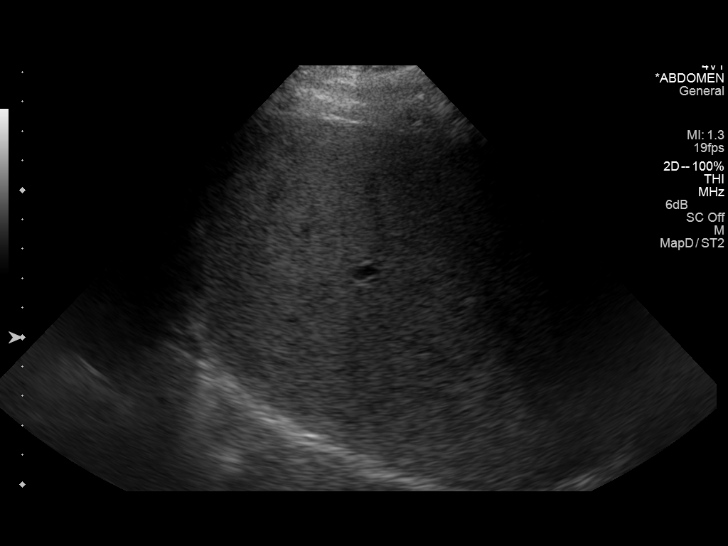
[im 30/66]
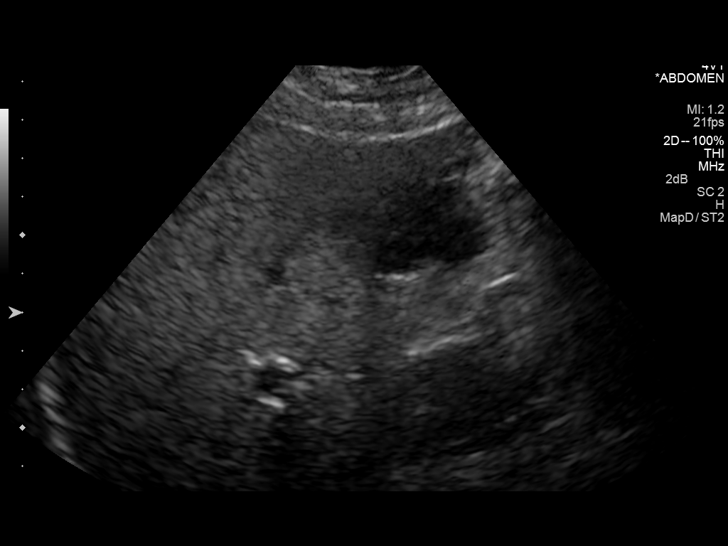
[im 36/66]
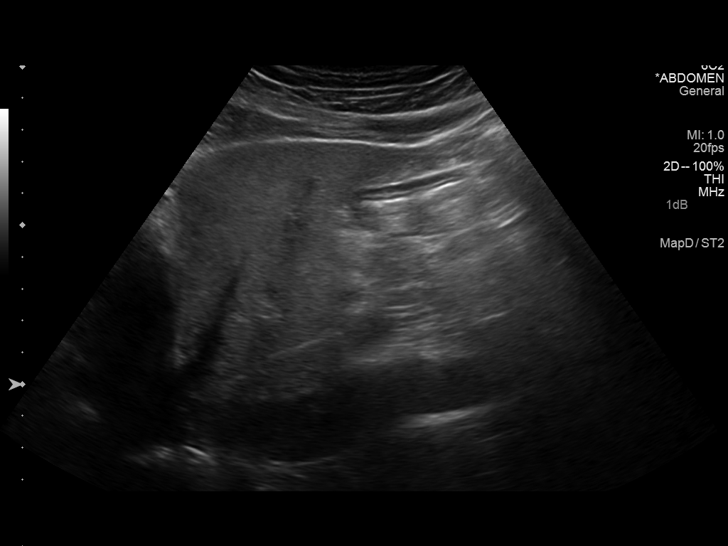
[im 41/66]
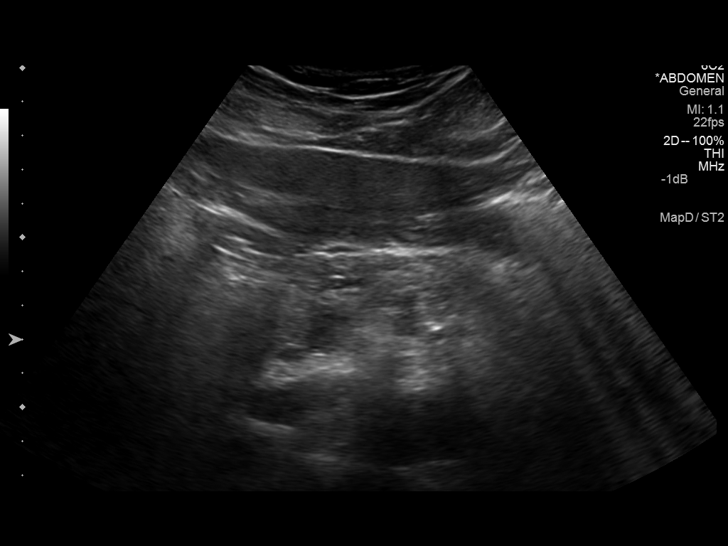
[im 44/66]
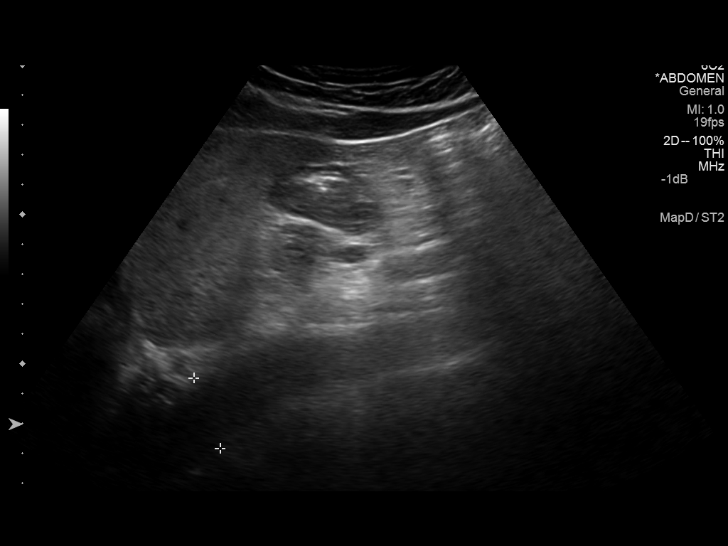
[im 49/66]
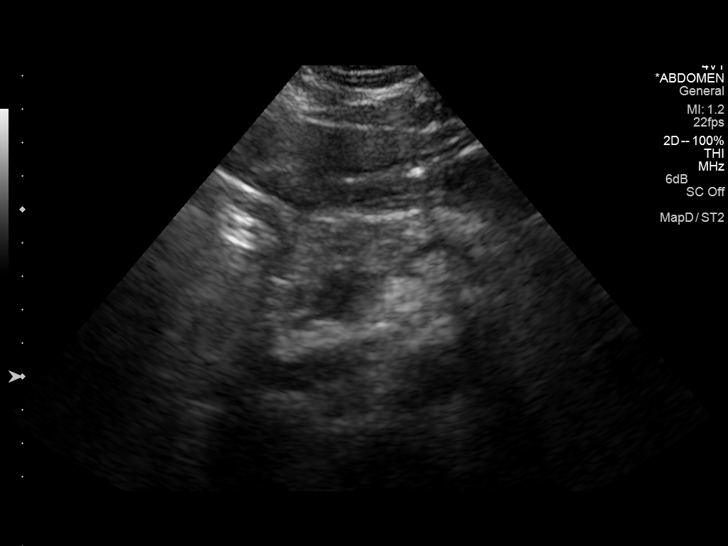
[im 55/66]
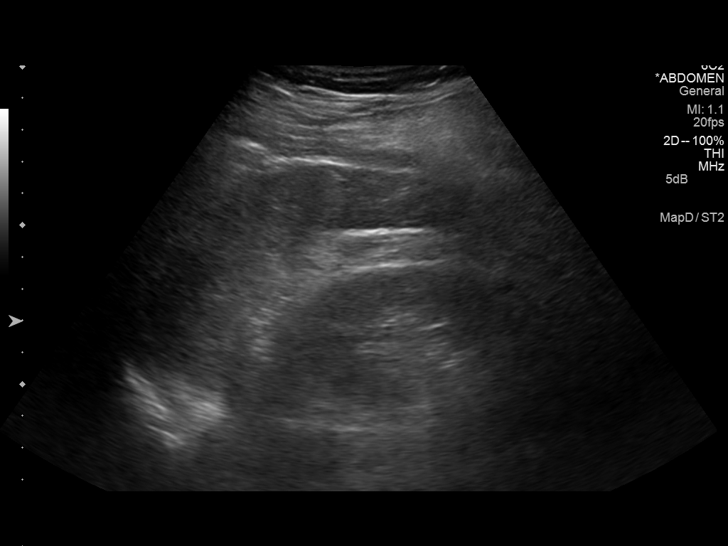
[im 60/66]
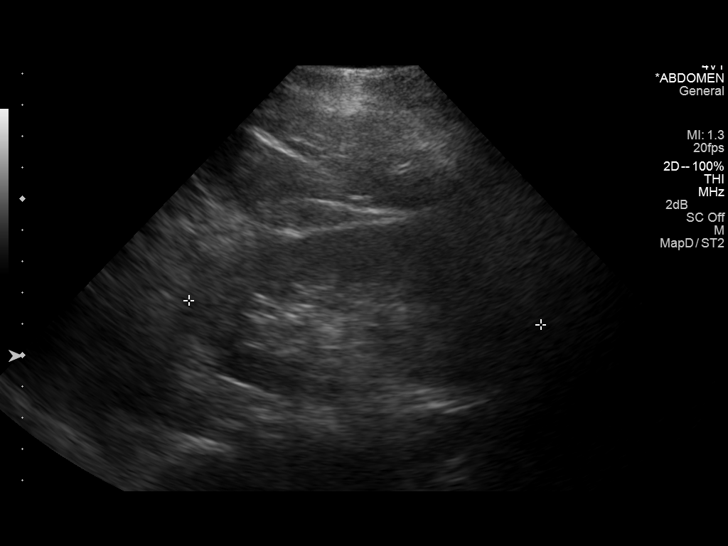
[im 66/66]
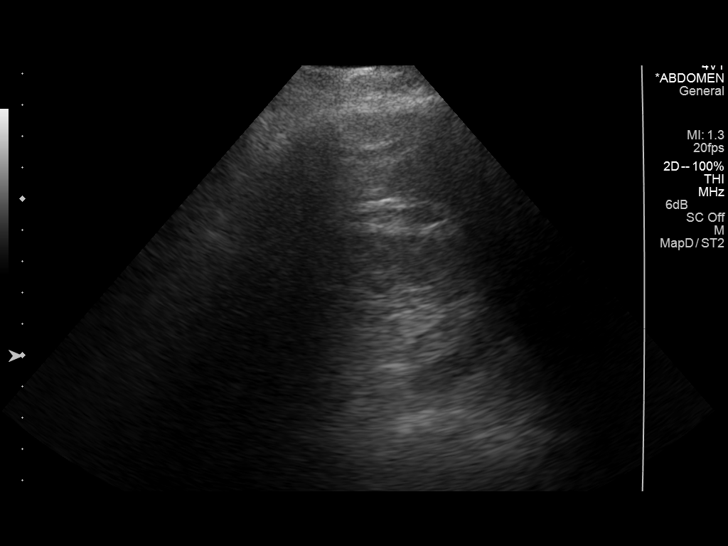

[14 of 25 positions shown; findings below may reference images not displayed]

FINDINGS: Gallbladder:

No gallstones or wall thickening visualized. No sonographic Murphy
sign noted. The gallbladder fundal "Mass" described on prior CT is
not visualized.

Common bile duct:

Diameter: Normal, 4 mm.

Liver:

Mildly increased in echogenicity.

IVC:

No abnormality visualized.

Pancreas:

Visualized portion unremarkable.

Spleen:

Size and appearance within normal limits.

Right Kidney:

Length: 11.4 cm. Echogenicity within normal limits. No mass or
hydronephrosis visualized.

Left Kidney:

Length: 11.3 cm. Echogenicity within normal limits. No mass or
hydronephrosis visualized.

Abdominal aorta:

No aneurysm visualized.

Other findings:

None.
IMPRESSION: No acute process or explanation for right upper quadrant pain.

Mild hepatic steatosis.

Gallbladder fundal lesion described on prior CT is not visualized.
Given its stability since 0222, this is most likely benign focal
adenomyomatosis.

## 2016-03-31 ENCOUNTER — Ambulatory Visit (INDEPENDENT_AMBULATORY_CARE_PROVIDER_SITE_OTHER): Payer: PPO | Admitting: Pulmonary Disease

## 2016-03-31 VITALS — BP 170/86 | HR 80 | Temp 98.0°F | Wt 186.4 lb

## 2016-03-31 DIAGNOSIS — Z9114 Patient's other noncompliance with medication regimen: Secondary | ICD-10-CM

## 2016-03-31 DIAGNOSIS — J4530 Mild persistent asthma, uncomplicated: Secondary | ICD-10-CM

## 2016-03-31 DIAGNOSIS — J45909 Unspecified asthma, uncomplicated: Secondary | ICD-10-CM

## 2016-03-31 DIAGNOSIS — I10 Essential (primary) hypertension: Secondary | ICD-10-CM | POA: Diagnosis not present

## 2016-03-31 DIAGNOSIS — F17211 Nicotine dependence, cigarettes, in remission: Secondary | ICD-10-CM | POA: Diagnosis not present

## 2016-03-31 MED ORDER — CETIRIZINE HCL 10 MG PO TABS: 10.0000 mg | ORAL_TABLET | Freq: Every day | ORAL | 1 refills | Status: DC

## 2016-03-31 NOTE — Patient Instructions (Signed)
When you fill your cetirizine, stop the chlorpheniramine Follow up in 2 weeks

## 2016-03-31 NOTE — Progress Notes (Signed)
   CC: Hypertension  HPI:  [name] is a 66 y.o. man with history as noted below here for follow-up of hypertension  He hasnt been taking his blood pressure medicine like he should. He did not take any today.    Past Medical History:  Diagnosis Date  . Asthma   . Hypertension   . Prostate cancer [facility])     Review of Systems:   No chest pain No dyspnea No headache  Physical Exam:  Vitals:   03/31/16 0929  BP: (!) 170/86  Pulse: 80  Temp: 98 F (36.7 C)  TempSrc: Oral  SpO2: 97%  Weight: 186 lb 6.4 oz (84.6 kg)   General Apperance: NAD HEENT: Normocephalic, atraumatic, anicteric sclera Neck: Supple, trachea midline Lungs: Clear to auscultation bilaterally. No wheezes, rhonchi or rales. Breathing comfortably Heart: Regular rate and rhythm, no murmur/rub/gallop Abdomen: Soft, nontender, nondistended, no rebound/guarding Extremities: Warm and well perfused, no edema Skin: No rashes or lesions Neurologic: Alert and interactive. No gross deficits.  Assessment & Plan:   See Encounters Tab for problem based charting.  Patient discussed with Dr. Lynnae January

## 2016-04-01 ENCOUNTER — Telehealth: Payer: Self-pay | Admitting: *Deleted

## 2016-04-01 NOTE — Assessment & Plan Note (Signed)
Assessment: BP remains uncontrolled. He admits to medication noncompliance  Plan: Restart triamterene-hydrochlorothiazide 37.5-25mg  daily Follow-up in 1-2 weeks

## 2016-04-01 NOTE — Telephone Encounter (Signed)
All to patient in reference to his call about his inhaler/Nebulizer.  Message was left that the Clinics had called and return our call and to ask for [facility].  Sander Nephew, RN 04/01/2016 11:15 AM.

## 2016-04-01 NOTE — Assessment & Plan Note (Signed)
He is interested in going back to the daily allergy medicine. We'll restart cetirizine 10 mg daily and discontinue chlorpheniramine.  He has not received a steroid inhaler. Says he got a nebulizer version. Will try to sort this out.

## 2016-04-02 NOTE — Progress Notes (Signed)
Internal Medicine Clinic Attending  Case discussed with Dr. Krall soon after the resident saw the patient.  We reviewed the resident's history and exam and pertinent patient test results.  I agree with the assessment, diagnosis, and plan of care documented in the resident's note. 

## 2016-04-05 DIAGNOSIS — H401113 Primary open-angle glaucoma, right eye, severe stage: Secondary | ICD-10-CM | POA: Diagnosis not present

## 2016-04-05 DIAGNOSIS — H401122 Primary open-angle glaucoma, left eye, moderate stage: Secondary | ICD-10-CM | POA: Diagnosis not present

## 2016-04-14 ENCOUNTER — Ambulatory Visit (INDEPENDENT_AMBULATORY_CARE_PROVIDER_SITE_OTHER): Payer: PPO | Admitting: Internal Medicine

## 2016-04-14 ENCOUNTER — Encounter: Payer: Self-pay | Admitting: Internal Medicine

## 2016-04-14 VITALS — BP 157/75 | HR 81 | Temp 98.7°F | Ht 68.0 in | Wt 178.6 lb

## 2016-04-14 DIAGNOSIS — Z79899 Other long term (current) drug therapy: Secondary | ICD-10-CM | POA: Diagnosis not present

## 2016-04-14 DIAGNOSIS — F17211 Nicotine dependence, cigarettes, in remission: Secondary | ICD-10-CM

## 2016-04-14 DIAGNOSIS — I10 Essential (primary) hypertension: Secondary | ICD-10-CM | POA: Diagnosis not present

## 2016-04-14 DIAGNOSIS — J309 Allergic rhinitis, unspecified: Secondary | ICD-10-CM

## 2016-04-14 DIAGNOSIS — J45909 Unspecified asthma, uncomplicated: Secondary | ICD-10-CM | POA: Diagnosis not present

## 2016-04-14 DIAGNOSIS — J4530 Mild persistent asthma, uncomplicated: Secondary | ICD-10-CM

## 2016-04-14 MED ORDER — BUDESONIDE 90 MCG/ACT IN AEPB
1.0000 | INHALATION_SPRAY | Freq: Two times a day (BID) | RESPIRATORY_TRACT | 2 refills | Status: DC
Start: 2016-04-14 — End: 2016-06-04

## 2016-04-14 MED ORDER — AMLODIPINE BESYLATE 5 MG PO TABS
5.0000 mg | ORAL_TABLET | Freq: Every day | ORAL | 1 refills | Status: DC
Start: 2016-04-14 — End: 2016-06-04

## 2016-04-14 MED ORDER — TRIAMTERENE-HCTZ 37.5-25 MG PO CAPS: 1.0000 | ORAL_CAPSULE | Freq: Every day | ORAL | 1 refills | Status: DC

## 2016-04-14 MED ORDER — CETIRIZINE HCL 10 MG PO TABS: 10.0000 mg | ORAL_TABLET | Freq: Every day | ORAL | 11 refills | Status: DC

## 2016-04-14 MED ORDER — CETIRIZINE HCL 10 MG PO TABS: 10.0000 mg | ORAL_TABLET | Freq: Every day | ORAL | 1 refills | Status: DC

## 2016-04-14 NOTE — Patient Instructions (Addendum)
For your blood pressure continue taking Dyazide once a day. Also adding a second blood pressure medicine called amlodipine, please also take this once a day.  Have refilled Pulmicort and Zyrtec to [facility]. Will see you back in about 2 weeks to recheck your blood pressure and see if it's improved.   It was nice meeting you today!

## 2016-04-14 NOTE — Progress Notes (Signed)
   CC: hypertension, allergies, asthma  HPI:  [name] is a 66 y.o. with a PMH listed below presenting to clinic for follow up on his chronic allergic sinusitis, asthma, and hypertension.  HTN: Patient on triamterene-HCTZ 37.5-25mg  daily; he states that he takes his medicine most days but does miss some doses. He does not have a regular time he takes them so will go back and forth between AM and PM dosing. Patient endorses taking his meds last night. He denies chest pain, changes in his shortness of breath, headache, vision or hearing changes. He denies lower extremity swelling.   Asthma: Patient with asthma on PRN albuterol; He is supposed to be on Pulmicort as well but has not received it. He states that he will occasionally get shortness of breath only with rest which lasts for a few minutes and is relieved by albuterol. He denies wheezing, cough.  Allergic sinusitis: Patient states that he has been using nasal irrigation with good response. He has not used any antihistamines due to the script being sent to the incorrect pharmacy.  Please see problem based Assessment and Plan for status of patients chronic conditions.  Past Medical History:  Diagnosis Date  . Asthma   . Hypertension   . Prostate cancer ([place])     Review of Systems:   Review of Systems  Constitutional: Negative for chills, fever and malaise/fatigue.  HENT: Positive for congestion (stable, clear mucous). Negative for hearing loss.   Eyes: Negative for blurred vision and double vision.  Respiratory: Positive for shortness of breath (occasional with rest only). Negative for cough, sputum production and wheezing.   Cardiovascular: Negative for chest pain, palpitations, orthopnea and leg swelling.  Gastrointestinal: Negative for abdominal pain, blood in stool, constipation, diarrhea, melena, nausea and vomiting.  Neurological: Negative for dizziness, tingling, sensory change, focal weakness and headaches.     Physical Exam:  Vitals:   04/14/16 0923 04/14/16 0944  BP: (!) 152/91 (!) 157/75  Pulse: 86 81  Temp: 98.7 F (37.1 C)   TempSrc: Oral   SpO2: 97%   Weight: 178 lb 9.6 oz (81 kg)   Height: 5\' 8"  (1.727 m)    Physical Exam  Constitutional: He is oriented to person, place, and time. He appears well-developed and well-nourished. No distress.  HENT:  Head: Normocephalic and atraumatic.  Nose: Nose normal.  Mouth/Throat: No oropharyngeal exudate.  Eyes: Conjunctivae and EOM are normal.  Neck: Normal range of motion. Neck supple. No JVD present.  Cardiovascular: Normal rate, regular rhythm, normal heart sounds and intact distal pulses.  Exam reveals no friction rub.   No murmur heard. Pulmonary/Chest: Effort normal and breath sounds normal. He has no wheezes. He has no rales.  Abdominal: Soft. Bowel sounds are normal. He exhibits no distension. There is no tenderness.  Musculoskeletal: Normal range of motion. He exhibits no edema.  Lymphadenopathy:    He has no cervical adenopathy.  Neurological: He is alert and oriented to person, place, and time. No cranial nerve deficit.  Skin: Skin is warm and dry. Capillary refill takes less than 2 seconds. He is not diaphoretic.  Psychiatric: He has a normal mood and affect. His behavior is normal. Judgment and thought content normal.    Assessment & Plan:   See Encounters Tab for problem based charting.   Patient discussed with Dr. Andris Baumann, MD Internal Medicine PGY1

## 2016-04-17 DIAGNOSIS — J309 Allergic rhinitis, unspecified: Secondary | ICD-10-CM | POA: Insufficient documentation

## 2016-04-17 NOTE — Assessment & Plan Note (Addendum)
Patient on triamterene-HCTZ 37.5-25mg  daily; he states that he takes his medicine most days but does miss some doses. He does not have a regular time he takes them so will go back and forth between AM and PM dosing. Patient endorses taking his meds last night. He denies chest pain, changes in his shortness of breath, headache, vision or hearing changes. He denies lower extremity swelling. BP elevated today to 150s/90s.  Patient has had Echo ordered for concern for diastolic HF but has not been called about appt yet.  Plan: --continue triamterene-HCTZ 37.5-35mg   --start amlodipine 5mg  daily --f/u in 2 weeks for BP check and medication adjustment --waiting for pre-approval for Echo per office staff; pt will be contacted for appt

## 2016-04-17 NOTE — Assessment & Plan Note (Signed)
Patient states that he has been using nasal irrigation with good response. He has not used any antihistamines due to the script being sent to the incorrect pharmacy.  Plan: --continue irrigation --sent cetirizine to correct pharmacy

## 2016-04-17 NOTE — Assessment & Plan Note (Signed)
Patient with asthma on PRN albuterol; He is supposed to be on Pulmicort and cetirizine as well but has not obtained them as they were sent to wrong pharmacy. He states that he will occasionally get shortness of breath only with rest which lasts for a few minutes and is relieved by albuterol. He denies wheezing, cough.  Plan: --re-order cetirizine and Pulmicort to correct pharmacy

## 2016-04-18 NOTE — Progress Notes (Signed)
Case discussed with Dr. Svalina at the time of the visit. We reviewed the resident's history and exam and pertinent patient test results. I agree with the assessment, diagnosis, and plan of care documented in the resident's note. 

## 2016-04-21 DIAGNOSIS — H401122 Primary open-angle glaucoma, left eye, moderate stage: Secondary | ICD-10-CM | POA: Diagnosis not present

## 2016-04-21 DIAGNOSIS — H25811 Combined forms of age-related cataract, right eye: Secondary | ICD-10-CM | POA: Diagnosis not present

## 2016-04-21 DIAGNOSIS — H25812 Combined forms of age-related cataract, left eye: Secondary | ICD-10-CM | POA: Diagnosis not present

## 2016-04-21 DIAGNOSIS — H401113 Primary open-angle glaucoma, right eye, severe stage: Secondary | ICD-10-CM | POA: Diagnosis not present

## 2016-04-26 ENCOUNTER — Ambulatory Visit (HOSPITAL_COMMUNITY): Admission: RE | Admit: 2016-04-26 | Payer: PPO | Source: Ambulatory Visit

## 2016-04-27 ENCOUNTER — Telehealth: Payer: Self-pay | Admitting: Internal Medicine

## 2016-04-27 NOTE — Telephone Encounter (Signed)
APT. REMINDER CALL, LMTCB °

## 2016-04-28 ENCOUNTER — Ambulatory Visit: Payer: PPO

## 2016-05-06 ENCOUNTER — Ambulatory Visit (HOSPITAL_COMMUNITY)
Admission: RE | Admit: 2016-05-06 | Discharge: 2016-05-06 | Disposition: A | Discharge status: Home / Self Care | Payer: PPO | Source: Ambulatory Visit | Attending: Physician Assistant | Admitting: Physician Assistant

## 2016-05-06 DIAGNOSIS — I080 Rheumatic disorders of both mitral and aortic valves: Secondary | ICD-10-CM | POA: Insufficient documentation

## 2016-05-06 DIAGNOSIS — R0602 Shortness of breath: Secondary | ICD-10-CM

## 2016-05-06 NOTE — Progress Notes (Signed)
  Echocardiogram 2D Echocardiogram has been performed.  Donata Clay 05/06/2016, 9:08 AM

## 2016-05-10 NOTE — Addendum Note (Signed)
Addended by: Jacques Earthly T on: 05/10/2016 08:39 PM   Modules accepted: Orders

## 2016-05-17 DIAGNOSIS — H401113 Primary open-angle glaucoma, right eye, severe stage: Secondary | ICD-10-CM | POA: Diagnosis not present

## 2016-05-17 DIAGNOSIS — H25811 Combined forms of age-related cataract, right eye: Secondary | ICD-10-CM | POA: Diagnosis not present

## 2016-05-19 ENCOUNTER — Encounter: Payer: Self-pay | Admitting: Interventional Cardiology

## 2016-05-28 ENCOUNTER — Ambulatory Visit: Payer: PPO

## 2016-06-04 ENCOUNTER — Encounter (INDEPENDENT_AMBULATORY_CARE_PROVIDER_SITE_OTHER): Payer: Self-pay

## 2016-06-04 ENCOUNTER — Ambulatory Visit (INDEPENDENT_AMBULATORY_CARE_PROVIDER_SITE_OTHER): Payer: PPO | Admitting: Internal Medicine

## 2016-06-04 VITALS — BP 169/82 | HR 88 | Temp 98.4°F | Ht 68.0 in | Wt 185.5 lb

## 2016-06-04 DIAGNOSIS — F17211 Nicotine dependence, cigarettes, in remission: Secondary | ICD-10-CM | POA: Diagnosis not present

## 2016-06-04 DIAGNOSIS — I5020 Unspecified systolic (congestive) heart failure: Secondary | ICD-10-CM | POA: Diagnosis not present

## 2016-06-04 DIAGNOSIS — I509 Heart failure, unspecified: Secondary | ICD-10-CM

## 2016-06-04 MED ORDER — ISOSORBIDE MONONITRATE ER 30 MG PO TB24: 30.0000 mg | ORAL_TABLET | Freq: Every day | ORAL | 1 refills | Status: DC

## 2016-06-04 MED ORDER — CARVEDILOL 3.125 MG PO TABS: 3.1250 mg | ORAL_TABLET | Freq: Two times a day (BID) | ORAL | 1 refills | Status: DC

## 2016-06-04 NOTE — Patient Instructions (Signed)
[name],  I am going to start some medications today. I am going to send them in to your pharmacy. Please follow up with the cardiologist on Monday.   Isosorbide Mononitrate extended-release tablets What is this medicine? ISOSORBIDE MONONITRATE (eye soe SOR bide mon oh NYE trate) is a vasodilator. It relaxes blood vessels, increasing the blood and oxygen supply to your heart. This medicine is used to prevent chest pain caused by angina. It will not help to stop an episode of chest pain. This medicine may be used for other purposes; ask your health care provider or pharmacist if you have questions. COMMON BRAND NAME(S): Imdur, Isotrate ER What should I tell my health care provider before I take this medicine? They need to know if you have any of these conditions: -previous heart attack or heart failure -an unusual or allergic reaction to isosorbide mononitrate, nitrates, other medicines, foods, dyes, or preservatives -pregnant or trying to get pregnant -breast-feeding How should I use this medicine? Take this medicine by mouth with a glass of water. Follow the directions on the prescription label. Do not crush or chew. Take your medicine at regular intervals. Do not take your medicine more often than directed. Do not stop taking this medicine except on the advice of your doctor or health care professional. Talk to your pediatrician regarding the use of this medicine in children. Special care may be needed. Overdosage: If you think you have taken too much of this medicine contact a poison control center or emergency room at once. NOTE: This medicine is only for you. Do not share this medicine with others. What if I miss a dose? If you miss a dose, take it as soon as you can. If it is almost time for your next dose, take only that dose. Do not take double or extra doses. What may interact with this medicine? Do not take this medicine with any of the following medications: -medicines used to  treat erectile dysfunction (ED) like avanafil, sildenafil, tadalafil, and vardenafil -riociguat This medicine may also interact with the following medications: -medicines for high blood pressure -other medicines for angina or heart failure This list may not describe all possible interactions. Give your health care provider a list of all the medicines, herbs, non-prescription drugs, or dietary supplements you use. Also tell them if you smoke, drink alcohol, or use illegal drugs. Some items may interact with your medicine. What should I watch for while using this medicine? Check your heart rate and blood pressure regularly while you are taking this medicine. Ask your doctor or health care professional what your heart rate and blood pressure should be and when you should contact him or her. Tell your doctor or health care professional if you feel your medicine is no longer working. You may get dizzy. Do not drive, use machinery, or do anything that needs mental alertness until you know how this medicine affects you. To reduce the risk of dizzy or fainting spells, do not sit or stand up quickly, especially if you are an older patient. Alcohol can make you more dizzy, and increase flushing and rapid heartbeats. Avoid alcoholic drinks. Do not treat yourself for coughs, colds, or pain while you are taking this medicine without asking your doctor or health care professional for advice. Some ingredients may increase your blood pressure. What side effects may I notice from receiving this medicine? Side effects that you should report to your doctor or health care professional as soon as possible: -bluish discoloration  of lips, fingernails, or palms of hands -irregular heartbeat, palpitations -low blood pressure -nausea, vomiting -persistent headache -unusually weak or tired Side effects that usually do not require medical attention (report to your doctor or health care professional if they continue or are  bothersome): -flushing of the face or neck -rash This list may not describe all possible side effects. Call your doctor for medical advice about side effects. You may report side effects to FDA at 1-800-FDA-1088. Where should I keep my medicine? Keep out of the reach of children. Store between 15 and 30 degrees C (59 and 86 degrees F). Keep container tightly closed. Throw away any unused medicine after the expiration date. NOTE: This sheet is a summary. It may not cover all possible information. If you have questions about this medicine, talk to your doctor, pharmacist, or health care provider.  2018 Elsevier/Gold Standard (2012-11-24 14:48:19)  Carvedilol tablets What is this medicine? CARVEDILOL (KAR ve dil ol) is a beta-blocker. Beta-blockers reduce the workload on the heart and help it to beat more regularly. This medicine is used to treat high blood pressure and heart failure. This medicine may be used for other purposes; ask your health care provider or pharmacist if you have questions. COMMON BRAND NAME(S): Coreg What should I tell my health care provider before I take this medicine? They need to know if you have any of these conditions: -circulation problems -diabetes -history of heart attack or heart disease -liver disease -lung or breathing disease, like asthma or emphysema -pheochromocytoma -slow or irregular heartbeat -thyroid disease -an unusual or allergic reaction to carvedilol, other beta-blockers, medicines, foods, dyes, or preservatives -pregnant or trying to get pregnant -breast-feeding How should I use this medicine? Take this medicine by mouth with a glass of water. Follow the directions on the prescription label. It is best to take the tablets with food. Take your doses at regular intervals. Do not take your medicine more often than directed. Do not stop taking except on the advice of your doctor or health care professional. Talk to your pediatrician regarding the  use of this medicine in children. Special care may be needed. Overdosage: If you think you have taken too much of this medicine contact a poison control center or emergency room at once. NOTE: This medicine is only for you. Do not share this medicine with others. What if I miss a dose? If you miss a dose, take it as soon as you can. If it is almost time for your next dose, take only that dose. Do not take double or extra doses. What may interact with this medicine? This medicine may interact with the following medications: -certain medicines for blood pressure, heart disease, irregular heart beat -certain medicines for depression, like fluoxetine or paroxetine -certain medicines for diabetes, like glipizide or glyburide -cimetidine -clonidine -cyclosporine -digoxin -MAOIs like Carbex, Eldepryl, Marplan, Nardil, and Parnate -reserpine -rifampin This list may not describe all possible interactions. Give your health care provider a list of all the medicines, herbs, non-prescription drugs, or dietary supplements you use. Also tell them if you smoke, drink alcohol, or use illegal drugs. Some items may interact with your medicine. What should I watch for while using this medicine? Check your heart rate and blood pressure regularly while you are taking this medicine. Ask your doctor or health care professional what your heart rate and blood pressure should be, and when you should contact him or her. Do not stop taking this medicine suddenly. This could lead to  serious heart-related effects. Contact your doctor or health care professional if you have difficulty breathing while taking this drug. Check your weight daily. Ask your doctor or health care professional when you should notify him/her of any weight gain. You may get drowsy or dizzy. Do not drive, use machinery, or do anything that requires mental alertness until you know how this medicine affects you. To reduce the risk of dizzy or fainting  spells, do not sit or stand up quickly. Alcohol can make you more drowsy, and increase flushing and rapid heartbeats. Avoid alcoholic drinks. If you have diabetes, check your blood sugar as directed. Tell your doctor if you have changes in your blood sugar while you are taking this medicine. If you are going to have surgery, tell your doctor or health care professional that you are taking this medicine. What side effects may I notice from receiving this medicine? Side effects that you should report to your doctor or health care professional as soon as possible: -allergic reactions like skin rash, itching or hives, swelling of the face, lips, or tongue -breathing problems -dark urine -irregular heartbeat -swollen legs or ankles -vomiting -yellowing of the eyes or skin Side effects that usually do not require medical attention (report to your doctor or health care professional if they continue or are bothersome): -change in sex drive or performance -diarrhea -dry eyes (especially if wearing contact lenses) -dry, itching skin -headache -nausea -unusually tired This list may not describe all possible side effects. Call your doctor for medical advice about side effects. You may report side effects to FDA at 1-800-FDA-1088. Where should I keep my medicine? Keep out of the reach of children. Store at room temperature below 30 degrees C (86 degrees F). Protect from moisture. Keep container tightly closed. Throw away any unused medicine after the expiration date. NOTE: This sheet is a summary. It may not cover all possible information. If you have questions about this medicine, talk to your doctor, pharmacist, or health care provider.  2018 Elsevier/Gold Standard (2012-10-01 14:12:02)

## 2016-06-04 NOTE — Progress Notes (Signed)
   CC: ECHO results follow up  HPI:  Mr.Seaver Hollingshed is a 66 y.o. male with a past medical history listed below here today for follow up of his recent ECHO with [facility].   For details of today's visit and the status of his chronic medical issues please refer to the assessment and plan.   Past Medical History:  Diagnosis Date  . Asthma   . Hypertension   . Prostate cancer [facility])     Review of Systems:   No chest pain or shortness of breath  Physical Exam:  Vitals:   06/04/16 1408  BP: (!) 169/82  Pulse: 88  Temp: 98.4 F (36.9 C)  TempSrc: Oral  SpO2: 97%  Weight: 185 lb 8 oz (84.1 kg)  Height: 5\' 8"  (1.727 m)   Physical Exam  Constitutional: He is well-developed, well-nourished, and in no distress.  Neck: No JVD present.  Cardiovascular: Normal rate and regular rhythm.   Murmur heard. Pulmonary/Chest: Effort normal and breath sounds normal. He has no wheezes. He has no rales.  Abdominal: Soft. Bowel sounds are normal. He exhibits no distension. There is no tenderness.  Musculoskeletal: He exhibits no edema.  Skin: Skin is warm and dry.  Vitals reviewed.    Assessment & Plan:   See Encounters Tab for problem based charting.  Patient discussed with Dr. Lynnae January

## 2016-06-05 LAB — LIPID PANEL
Chol/HDL Ratio: 3 ratio (ref 0.0–5.0)
Cholesterol, Total: 196 mg/dL (ref 100–199)
HDL: 65 mg/dL (ref >39)
LDL Calculated: 101 mg/dL — ABNORMAL HIGH (ref 0–99)
Triglycerides: 148 mg/dL (ref 0–149)
VLDL Cholesterol Cal: 30 mg/dL (ref 5–40)

## 2016-06-06 NOTE — Progress Notes (Signed)
Cardiology Office Note   Date:  06/07/2016   ID:  Leonard [name], DOB 03-Feb-1951, MRN [id number]  PCP:  Maryellen Pile, MD    Chief Complaint  Patient presents with  . Establish Care     Wt Readings from Last 3 Encounters:  06/07/16 188 lb (85.3 kg)  06/04/16 185 lb 8 oz (84.1 kg)  04/14/16 178 lb 9.6 oz (81 kg)       History of Present Illness:  Leonard [name] is a 66 y.o. male who is being seen today for the evaluation of abnormal echo at the request of Milagros Loll, MD.  Echo showed in 3/18 :  Left ventricle: Diffuse hypokinesis worse in the inferior wall   The cavity size was moderately dilated. Wall thickness was   normal. Systolic function was mildly to moderately reduced. The   estimated ejection fraction was in the range of 40% to 45%.   Doppler parameters are consistent with abnormal left ventricular   relaxation (grade 1 diastolic dysfunction). - Aortic valve: There was mild regurgitation. - Mitral valve: There was mild regurgitation. - Left atrium: The atrium was moderately dilated. - Atrial septum: No defect or patent foramen ovale was identified.  He has been treated for HTN with meds adjusted recently.  He has not started the coreg and imdur.     He stopped smoking about 6 months ago and has gained weight since that time.  He smoked for 30 years.  He notes some shortness of breath when he lies down.  He has some fatigue with walking.  He denies ay prior heart testing.    He denies any volume overload.      Past Medical History:  Diagnosis Date  . Asthma   . Hypertension   . Prostate cancer [facility])     Past Surgical History:  Procedure Laterality Date  . APPENDECTOMY    . HYDROCELE EXCISION Left 12/01/2015   Procedure: REPAIR OF LEFT HYDROCELE;  Surgeon: Raynelle Bring, MD;  Location: [facility];  Service: Urology;  Laterality: Left;  . LYMPHADENECTOMY Bilateral 09/24/2013   Procedure: LYMPHADENECTOMY;  Surgeon: Raynelle Bring, MD;   Location: [facility];  Service: Urology;  Laterality: Bilateral;  . PROSTATE BIOPSY    . ROBOT ASSISTED LAPAROSCOPIC RADICAL PROSTATECTOMY N/A 09/24/2013   Procedure: ROBOTIC ASSISTED LAPAROSCOPIC RADICAL PROSTATECTOMY LEVEL 2;  Surgeon: Raynelle Bring, MD;  Location: [facility];  Service: Urology;  Laterality: N/A;     Current Outpatient Prescriptions  Medication Sig Dispense Refill  . albuterol (PROVENTIL) (2.5 MG/3ML) 0.083% nebulizer solution Take 3 mLs (2.5 mg total) by nebulization every 6 (six) hours as needed for wheezing or shortness of breath. 75 mL 2  . carvedilol (COREG) 3.125 MG tablet Take 1 tablet (3.125 mg total) by mouth 2 (two) times daily with a meal. 60 tablet 1  . cetirizine (ZYRTEC) 10 MG tablet Take 1 tablet (10 mg total) by mouth daily. 30 tablet 11  . isosorbide mononitrate (IMDUR) 30 MG 24 hr tablet Take 1 tablet (30 mg total) by mouth daily. 30 tablet 1  . latanoprost (XALATAN) 0.005 % ophthalmic solution     . TRAVATAN Z 0.004 % SOLN ophthalmic solution      No current facility-administered medications for this visit.     Allergies:   Losartan    Social History:  The patient  reports that he quit smoking about 5 months ago. His smoking use included Cigarettes. He has a 60.00 pack-year smoking history. He  has never used smokeless tobacco. He reports that he drinks alcohol. He reports that he does not use drugs.   Family History:  The patient's family history includes Hypertension in his sister. Mother died from trauma- GSW.  Father unknown.   ROS:  Please see the history of present illness.   Otherwise, review of systems are positive for difficulty tolerating.   All other systems are reviewed and negative.    PHYSICAL EXAM: VS:  BP (!) 160/90   Pulse 68   Ht 5\' 8"  (1.727 m)   Wt 188 lb (85.3 kg)   BMI 28.59 kg/m  , BMI Body mass index is 28.59 kg/m. GEN: Well nourished, well developed, in no acute distress  HEENT: normal  Neck: no JVD, carotid bruits, or  masses Cardiac: RRR; no murmurs, rubs, or gallops,no edema  Respiratory:  clear to auscultation bilaterally, normal work of breathing GI: soft, nontender, nondistended, + BS MS: no deformity or atrophy  Skin: warm and dry, no rash Neuro:  Strength and sensation are intact Psych: euthymic mood, full affect   EKG:   The ekg ordered today demonstrates    Recent Labs: 01/10/2016: ALT 41; Magnesium 2.1 01/19/2016: B Natriuretic Peptide 484.0; Hemoglobin 13.4; Platelets 205 03/10/2016: BUN 16; Creatinine, Ser 1.31; Potassium 4.6; Sodium 143   Lipid Panel    Component Value Date/Time   CHOL 196 06/04/2016 1505   TRIG 148 06/04/2016 1505   HDL 65 06/04/2016 1505   CHOLHDL 3.0 06/04/2016 1505   LDLCALC 101 (H) 06/04/2016 1505     Other studies Reviewed: Additional studies/ records that were reviewed today with results demonstrating: Echo results noted above.   ASSESSMENT AND PLAN:  1. Abnormal echocardiogram/ acute systolic heart failure: Difficult to know time course of LV dysfunction.   Start Coreg and Imdur.  He will need an ischemia workup. We'll plan for right and left heart catheterization. The risks and benefits of the procedure were explained to the patient. All questions were answered. Coronary artery disease would be a possible etiology for his LV dysfunction. His shortness of breath could be an anginal equivalent. 2. HTN: Diuretic for HTN may be helping maintain fluid balance.  Currently, appears euvolemic but he does have some symptoms of orthopnea and dyspnea on exertion. 3. History of smoking: He has stop smoking but gained weight since stopping. 4. CAD: Will encourage hydration pre-and post procedure. Minimize contrast dye usage.   Current medicines are reviewed at length with the patient today.  The patient concerns regarding his medicines were addressed.  The following changes have been made:  No change  Labs/ tests ordered today include:  No orders of the defined  types were placed in this encounter.   Recommend 150 minutes/week of aerobic exercise Low fat, low carb, high fiber diet recommended  Disposition:   FU for cath   Signed, [name], MD  06/07/2016 9:27 AM    [facility] [place], [place], [place]  [address] Phone: [phone]; Fax: [phone]

## 2016-06-07 ENCOUNTER — Ambulatory Visit (INDEPENDENT_AMBULATORY_CARE_PROVIDER_SITE_OTHER): Payer: PPO | Admitting: Interventional Cardiology

## 2016-06-07 ENCOUNTER — Encounter (INDEPENDENT_AMBULATORY_CARE_PROVIDER_SITE_OTHER): Payer: Self-pay

## 2016-06-07 ENCOUNTER — Encounter: Payer: Self-pay | Admitting: Interventional Cardiology

## 2016-06-07 VITALS — BP 160/90 | HR 68 | Ht 68.0 in | Wt 188.0 lb

## 2016-06-07 DIAGNOSIS — I5021 Acute systolic (congestive) heart failure: Secondary | ICD-10-CM

## 2016-06-07 DIAGNOSIS — R0602 Shortness of breath: Secondary | ICD-10-CM

## 2016-06-07 DIAGNOSIS — I10 Essential (primary) hypertension: Secondary | ICD-10-CM

## 2016-06-07 DIAGNOSIS — R931 Abnormal findings on diagnostic imaging of heart and coronary circulation: Secondary | ICD-10-CM | POA: Diagnosis not present

## 2016-06-07 DIAGNOSIS — N182 Chronic kidney disease, stage 2 (mild): Secondary | ICD-10-CM

## 2016-06-07 LAB — CBC
Hematocrit: 39.5 % (ref 37.5–51.0)
Hemoglobin: 13.4 g/dL (ref 13.0–17.7)
MCH: 32.8 pg (ref 26.6–33.0)
MCHC: 33.9 g/dL (ref 31.5–35.7)
MCV: 97 fL (ref 79–97)
Platelets: 218 10*3/uL (ref 150–379)
RBC: 4.08 x10E6/uL — ABNORMAL LOW (ref 4.14–5.80)
RDW: 13.9 % (ref 12.3–15.4)
WBC: 7.3 10*3/uL (ref 3.4–10.8)

## 2016-06-07 LAB — BASIC METABOLIC PANEL
BUN/Creatinine Ratio: 15 (ref 10–24)
BUN: 18 mg/dL (ref 8–27)
CO2: 23 mmol/L (ref 18–29)
Calcium: 9.9 mg/dL (ref 8.6–10.2)
Chloride: 102 mmol/L (ref 96–106)
Creatinine, Ser: 1.21 mg/dL (ref 0.76–1.27)
GFR calc Af Amer: 72 mL/min/{1.73_m2} (ref >59)
GFR calc non Af Amer: 62 mL/min/{1.73_m2} (ref >59)
Glucose: 91 mg/dL (ref 65–99)
Potassium: 4.7 mmol/L (ref 3.5–5.2)
Sodium: 141 mmol/L (ref 134–144)

## 2016-06-07 LAB — PROTIME-INR
INR: 1.1 (ref 0.8–1.2)
Prothrombin Time: 11.3 s (ref 9.1–12.0)

## 2016-06-07 NOTE — Assessment & Plan Note (Signed)
Recent ECHO 05/06/2016 showed LV EF 40-45% with diffuse hypokinesis, worse in the inferior wall. Grade 1 diastolic dysfunction.   He reports that he does have orthopnea and PND. Denies any chest pain, shortness of breath with exertion or lower extremity swelling.   Discussed the results of the study with him today. He has had problems with medications previously with cough with ACE and rash with ARB.  Assessment: [facility]  Plan: Will start coreg 3.125 mg bid and imdur 30 mg daily today. Given his adverse reactions, non-compliance and self discontinuation of medications in the past will on start hydralazine today. Will plan on adding in the future.  Has an appointment with cardiology scheduled for next week. Will need ischemic evaluation.

## 2016-06-07 NOTE — Patient Instructions (Addendum)
Medication Instructions:  Your physician recommends that you continue on your current medications as directed. Please refer to the Current Medication list given to you today.   Labwork: TODAY BMET, CBC, PT/INR; PRE CATH  Testing/Procedures: Your physician has requested that you have a cardiac catheterization. Cardiac catheterization is used to diagnose and/or treat various heart conditions. Doctors may recommend this procedure for a number of different reasons. The most common reason is to evaluate chest pain. Chest pain can be a symptom of coronary artery disease (CAD), and cardiac catheterization can show whether plaque is narrowing or blocking your heart's arteries. This procedure is also used to evaluate the valves, as well as measure the blood flow and oxygen levels in different parts of your heart. For further information please visit [email or web address]. Please follow instruction sheet, as given.    Follow-Up: 06/25/16 @ 8:20 WITH DR. VARANASI POST PROCEDURE FOLLOW   Any Other Special Instructions Will Be Listed Below (If Applicable). If you need a refill on your cardiac medications before your next appointment, please call your pharmacy.    [facility] [address], [place] [address] [place] [address] Dept: [phone] Loc: [phone]  Stetson Pelaez  06/07/2016  You are scheduled for a Cardiac Catheterization on Thursday, May 3 with Dr. [name] @ 1:30 PM  1. Please arrive at the [facility] (Main Entrance A) at [facility]: [address] [place], [place] [address] at 11:30 AM (two hours before your procedure to ensure your preparation). Free valet parking service is available.   Special note: Every effort is made to have your procedure done on time. Please understand that emergencies sometimes delay scheduled procedures.  2. Diet: You may have a clear liquid breakfast, but  nothing after 8 AM on your procedure day.  3. Labs: You will need to have blood drawn on Monday, April 30 at [facility]. [address]  Open: 7:30am - 5pm    Phone: [phone]. You do not need to be fasting.  4. Medication instructions in preparation for your procedure:    On the morning of your procedure, take ASPIRIN 81 MG.    and any morning medicines NOT listed above.  You may use sips of water.  5. Plan for one night stay--bring personal belongings. 6. Bring a current list of your medications and current insurance cards. 7. You MUST have a responsible person to drive you home. 8. Someone MUST be with you the first 24 hours after you arrive home or your discharge will be delayed. 9. Please wear clothes that are easy to get on and off and wear slip-on shoes.  Thank you for allowing [place] to care for you!   -- [facility]

## 2016-06-08 ENCOUNTER — Encounter: Payer: Self-pay | Admitting: Interventional Cardiology

## 2016-06-09 ENCOUNTER — Other Ambulatory Visit: Payer: Self-pay | Admitting: Interventional Cardiology

## 2016-06-09 DIAGNOSIS — I5021 Acute systolic (congestive) heart failure: Secondary | ICD-10-CM

## 2016-06-09 NOTE — Progress Notes (Signed)
Internal Medicine Clinic Attending  Case discussed with Dr. Boswell at the time of the visit.  We reviewed the resident's history and exam and pertinent patient test results.  I agree with the assessment, diagnosis, and plan of care documented in the resident's note.  

## 2016-06-10 ENCOUNTER — Ambulatory Visit (HOSPITAL_COMMUNITY)
Admission: RE | Admit: 2016-06-10 | Discharge: 2016-06-10 | Disposition: A | Discharge status: Home / Self Care | Payer: PPO | Source: Ambulatory Visit | Attending: Interventional Cardiology | Admitting: Interventional Cardiology

## 2016-06-10 ENCOUNTER — Ambulatory Visit (HOSPITAL_COMMUNITY)
Admission: RE | Disposition: A | Discharge status: Home / Self Care | Payer: Self-pay | Source: Ambulatory Visit | Attending: Interventional Cardiology

## 2016-06-10 DIAGNOSIS — I701 Atherosclerosis of renal artery: Secondary | ICD-10-CM | POA: Diagnosis not present

## 2016-06-10 DIAGNOSIS — I428 Other cardiomyopathies: Secondary | ICD-10-CM | POA: Insufficient documentation

## 2016-06-10 DIAGNOSIS — I97630 Postprocedural hematoma of a circulatory system organ or structure following a cardiac catheterization: Secondary | ICD-10-CM | POA: Diagnosis not present

## 2016-06-10 DIAGNOSIS — J45909 Unspecified asthma, uncomplicated: Secondary | ICD-10-CM | POA: Diagnosis not present

## 2016-06-10 DIAGNOSIS — Y840 Cardiac catheterization as the cause of abnormal reaction of the patient, or of later complication, without mention of misadventure at the time of the procedure: Secondary | ICD-10-CM | POA: Diagnosis not present

## 2016-06-10 DIAGNOSIS — I5021 Acute systolic (congestive) heart failure: Secondary | ICD-10-CM

## 2016-06-10 DIAGNOSIS — Z87891 Personal history of nicotine dependence: Secondary | ICD-10-CM | POA: Diagnosis not present

## 2016-06-10 DIAGNOSIS — I2510 Atherosclerotic heart disease of native coronary artery without angina pectoris: Secondary | ICD-10-CM

## 2016-06-10 DIAGNOSIS — I110 Hypertensive heart disease with heart failure: Secondary | ICD-10-CM | POA: Diagnosis not present

## 2016-06-10 HISTORY — PX: RIGHT/LEFT HEART CATH AND CORONARY ANGIOGRAPHY: CATH118266

## 2016-06-10 LAB — POCT I-STAT 3, VENOUS BLOOD GAS (G3P V)
Acid-base deficit: 3 mmol/L — ABNORMAL HIGH (ref 0.0–2.0)
Bicarbonate: 22.9 mmol/L (ref 20.0–28.0)
O2 Saturation: 61 %
TCO2: 24 mmol/L (ref 0–100)
pCO2, Ven: 43.5 mmHg — ABNORMAL LOW (ref 44.0–60.0)
pH, Ven: 7.329 (ref 7.250–7.430)
pO2, Ven: 34 mmHg (ref 32.0–45.0)

## 2016-06-10 LAB — POCT I-STAT 3, ART BLOOD GAS (G3+)
Acid-base deficit: 5 mmol/L — ABNORMAL HIGH (ref 0.0–2.0)
Bicarbonate: 20.7 mmol/L (ref 20.0–28.0)
O2 Saturation: 93 %
TCO2: 22 mmol/L (ref 0–100)
pCO2 arterial: 37.7 mmHg (ref 32.0–48.0)
pH, Arterial: 7.347 — ABNORMAL LOW (ref 7.350–7.450)
pO2, Arterial: 69 mmHg — ABNORMAL LOW (ref 83.0–108.0)

## 2016-06-10 SURGERY — RIGHT/LEFT HEART CATH AND CORONARY ANGIOGRAPHY
Anesthesia: LOCAL

## 2016-06-10 MED ORDER — HYDRALAZINE HCL 20 MG/ML IJ SOLN
20.0000 mg | Freq: Once | INTRAMUSCULAR | Status: AC
Start: 2016-06-10 — End: 2016-06-10
  Administered 2016-06-10: 20 mg via INTRAVENOUS

## 2016-06-10 MED ORDER — FENTANYL CITRATE (PF) 100 MCG/2ML IJ SOLN
INTRAMUSCULAR | Status: DC | PRN
  Administered 2016-06-10: 25 ug via INTRAVENOUS

## 2016-06-10 MED ORDER — ALBUTEROL SULFATE (2.5 MG/3ML) 0.083% IN NEBU
2.5000 mg | INHALATION_SOLUTION | Freq: Four times a day (QID) | RESPIRATORY_TRACT | Status: DC | PRN
  Administered 2016-06-10: 2.5 mg via RESPIRATORY_TRACT

## 2016-06-10 MED ORDER — MIDAZOLAM HCL 2 MG/2ML IJ SOLN
INTRAMUSCULAR | Status: AC
  Filled 2016-06-10: qty 2

## 2016-06-10 MED ORDER — HYDRALAZINE HCL 20 MG/ML IJ SOLN
INTRAMUSCULAR | Status: AC
  Administered 2016-06-10: 20 mg via INTRAVENOUS
  Filled 2016-06-10: qty 1

## 2016-06-10 MED ORDER — SODIUM CHLORIDE 0.9 % IV SOLN: 250.0000 mL | INTRAVENOUS | Status: DC | PRN

## 2016-06-10 MED ORDER — FENTANYL CITRATE (PF) 100 MCG/2ML IJ SOLN
INTRAMUSCULAR | Status: AC
  Filled 2016-06-10: qty 2

## 2016-06-10 MED ORDER — SODIUM CHLORIDE 0.9% FLUSH: 3.0000 mL | INTRAVENOUS | Status: DC | PRN

## 2016-06-10 MED ORDER — ASPIRIN 81 MG PO CHEW: 81.0000 mg | CHEWABLE_TABLET | ORAL | Status: DC

## 2016-06-10 MED ORDER — SODIUM CHLORIDE 0.9 % IV SOLN: INTRAVENOUS | Status: DC

## 2016-06-10 MED ORDER — MIDAZOLAM HCL 2 MG/2ML IJ SOLN
INTRAMUSCULAR | Status: DC | PRN
  Administered 2016-06-10: 1 mg via INTRAVENOUS

## 2016-06-10 MED ORDER — SODIUM CHLORIDE 0.9 % IV SOLN
INTRAVENOUS | Status: AC
  Administered 2016-06-10: 14:00:00 via INTRAVENOUS

## 2016-06-10 MED ORDER — SODIUM CHLORIDE 0.9% FLUSH: 3.0000 mL | Freq: Two times a day (BID) | INTRAVENOUS | Status: DC

## 2016-06-10 MED ORDER — VERAPAMIL HCL 2.5 MG/ML IV SOLN
INTRAVENOUS | Status: AC
  Filled 2016-06-10: qty 2

## 2016-06-10 MED ORDER — IOPAMIDOL (ISOVUE-370) INJECTION 76%
INTRAVENOUS | Status: DC | PRN
  Administered 2016-06-10: 90 mL

## 2016-06-10 MED ORDER — LIDOCAINE HCL (PF) 1 % IJ SOLN
INTRAMUSCULAR | Status: DC | PRN
  Administered 2016-06-10 (×2): 2 mL

## 2016-06-10 MED ORDER — CARVEDILOL 3.125 MG PO TABS: 6.2500 mg | ORAL_TABLET | Freq: Two times a day (BID) | ORAL | 1 refills | Status: DC

## 2016-06-10 MED ORDER — HEPARIN (PORCINE) IN NACL 2-0.9 UNIT/ML-% IJ SOLN
INTRAMUSCULAR | Status: AC
  Filled 2016-06-10: qty 1000

## 2016-06-10 MED ORDER — HEPARIN (PORCINE) IN NACL 2-0.9 UNIT/ML-% IJ SOLN
INTRAMUSCULAR | Status: DC | PRN
  Administered 2016-06-10: 16:00:00

## 2016-06-10 MED ORDER — ALBUTEROL SULFATE (2.5 MG/3ML) 0.083% IN NEBU
INHALATION_SOLUTION | RESPIRATORY_TRACT | Status: AC
  Administered 2016-06-10: 2.5 mg via RESPIRATORY_TRACT
  Filled 2016-06-10: qty 3

## 2016-06-10 MED ORDER — IOPAMIDOL (ISOVUE-370) INJECTION 76%
INTRAVENOUS | Status: AC
  Filled 2016-06-10: qty 100

## 2016-06-10 MED ORDER — HEPARIN SODIUM (PORCINE) 1000 UNIT/ML IJ SOLN
INTRAMUSCULAR | Status: DC | PRN
  Administered 2016-06-10: 4500 [IU] via INTRAVENOUS

## 2016-06-10 MED ORDER — HEPARIN SODIUM (PORCINE) 1000 UNIT/ML IJ SOLN
INTRAMUSCULAR | Status: AC
  Filled 2016-06-10: qty 1

## 2016-06-10 MED ORDER — LIDOCAINE HCL (PF) 1 % IJ SOLN
INTRAMUSCULAR | Status: AC
  Filled 2016-06-10: qty 30

## 2016-06-10 SURGICAL SUPPLY — 12 items
CATH 5FR JL3.5 JR4 ANG PIG MP (CATHETERS) ×1 IMPLANT
CATH BALLN WEDGE 5F 110CM (CATHETERS) ×1 IMPLANT
DEVICE RAD COMP TR BAND LRG (VASCULAR PRODUCTS) ×1 IMPLANT
GLIDESHEATH SLEND SS 6F .021 (SHEATH) ×1 IMPLANT
GUIDEWIRE INQWIRE 1.5J.035X260 (WIRE) IMPLANT
INQWIRE 1.5J .035X260CM (WIRE) ×2
KIT HEART LEFT (KITS) ×2 IMPLANT
PACK CARDIAC CATHETERIZATION (CUSTOM PROCEDURE TRAY) ×2 IMPLANT
SHEATH GLIDE SLENDER 4/5FR (SHEATH) ×1 IMPLANT
SYR MEDRAD MARK V 150ML (SYRINGE) ×2 IMPLANT
TRANSDUCER W/STOPCOCK (MISCELLANEOUS) ×2 IMPLANT
TUBING CIL FLEX 10 FLL-RA (TUBING) ×2 IMPLANT

## 2016-06-10 NOTE — Discharge Instructions (Signed)
Please note new dosage for your carvedilol  Radial Site Care Refer to this sheet in the next few weeks. These instructions provide you with information about caring for yourself after your procedure. Your health care provider may also give you more specific instructions. Your treatment has been planned according to current medical practices, but problems sometimes occur. Call your health care provider if you have any problems or questions after your procedure. What can I expect after the procedure? After your procedure, it is typical to have the following:  Bruising at the radial site that usually fades within 1-2 weeks.  Blood collecting in the tissue (hematoma) that may be painful to the touch. It should usually decrease in size and tenderness within 1-2 weeks. Follow these instructions at home:  Take medicines only as directed by your health care provider.  You may shower 24-48 hours after the procedure or as directed by your health care provider. Remove the bandage (dressing) and gently wash the site with plain soap and water. Pat the area dry with a clean towel. Do not rub the site, because this may cause bleeding.  Do not take baths, swim, or use a hot tub until your health care provider approves.  Check your insertion site every day for redness, swelling, or drainage.  Do not apply powder or lotion to the site.  Do not flex or bend the affected arm for 24 hours or as directed by your health care provider.  Do not push or pull heavy objects with the affected arm for 24 hours or as directed by your health care provider.  Do not lift over 10 lb (4.5 kg) for 5 days after your procedure or as directed by your health care provider.  Ask your health care provider when it is okay to:  Return to work or school.  Resume usual physical activities or sports.  Resume sexual activity.  Do not drive home if you are discharged the same day as the procedure. Have someone else drive  you.  You may drive 24 hours after the procedure unless otherwise instructed by your health care provider.  Do not operate machinery or power tools for 24 hours after the procedure.  If your procedure was done as an outpatient procedure, which means that you went home the same day as your procedure, a responsible adult should be with you for the first 24 hours after you arrive home.  Keep all follow-up visits as directed by your health care provider. This is important. Contact a health care provider if:  You have a fever.  You have chills.  You have increased bleeding from the radial site. Hold pressure on the site. CALL 911 Get help right away if:  You have unusual pain at the radial site.  You have redness, warmth, or swelling at the radial site.  You have drainage (other than a small amount of blood on the dressing) from the radial site.  The radial site is bleeding, and the bleeding does not stop after 30 minutes of holding steady pressure on the site.  Your arm or hand becomes pale, cool, tingly, or numb. This information is not intended to replace advice given to you by your health care provider. Make sure you discuss any questions you have with your health care provider. Document Released: 02/27/2010 Document Revised: 07/03/2015 Document Reviewed: 08/13/2013 Elsevier Interactive Patient Education  2017 [employer].

## 2016-06-10 NOTE — Interval H&P Note (Signed)
Cath Lab Visit (complete for each Cath Lab visit)  Clinical Evaluation Leading to the Procedure:   ACS: No.  Non-ACS:    Anginal Classification: CCS II  Anti-ischemic medical therapy: Minimal Therapy (1 class of medications)  Non-Invasive Test Results: Intermediate-risk stress test findings: cardiac mortality 1-3%/year  Prior CABG: No previous CABG  Abnormal echo, decreased LVEF.    History and Physical Interval Note:  06/10/2016 3:23 PM  Leonard [name]  has presented today for surgery, with the diagnosis of sob, abnormal echo  The various methods of treatment have been discussed with the patient and family. After consideration of risks, benefits and other options for treatment, the patient has consented to  Procedure(s): Right/Left Heart Cath and Coronary Angiography (N/A) as a surgical intervention .  The patient's history has been reviewed, patient examined, no change in status, stable for surgery.  I have reviewed the patient's chart and labs.  Questions were answered to the patient's satisfaction.     [name]

## 2016-06-10 NOTE — Progress Notes (Signed)
Slight hematoma proximal to the tr band developed x 2 and pressure was held to resolve.

## 2016-06-10 NOTE — H&P (View-Only) (Signed)
Cardiology Office Note   Date:  06/07/2016   ID:  Percival Glasheen, DOB [birth year], MRN [id number]  PCP:  Maryellen Pile, MD    Chief Complaint  Patient presents with  . Establish Care     Wt Readings from Last 3 Encounters:  06/07/16 188 lb (85.3 kg)  06/04/16 185 lb 8 oz (84.1 kg)  04/14/16 178 lb 9.6 oz (81 kg)       History of Present Illness:  Leonard [name] is a 66 y.o. male who is being seen today for the evaluation of abnormal echo at the request of Milagros Loll, MD.  Echo showed in 3/18 :  Left ventricle: Diffuse hypokinesis worse in the inferior wall   The cavity size was moderately dilated. Wall thickness was   normal. Systolic function was mildly to moderately reduced. The   estimated ejection fraction was in the range of 40% to 45%.   Doppler parameters are consistent with abnormal left ventricular   relaxation (grade 1 diastolic dysfunction). - Aortic valve: There was mild regurgitation. - Mitral valve: There was mild regurgitation. - Left atrium: The atrium was moderately dilated. - Atrial septum: No defect or patent foramen ovale was identified.  He has been treated for HTN with meds adjusted recently.  He has not started the coreg and imdur.     He stopped smoking about 6 months ago and has gained weight since that time.  He smoked for 30 years.  He notes some shortness of breath when he lies down.  He has some fatigue with walking.  He denies ay prior heart testing.    He denies any volume overload.      Past Medical History:  Diagnosis Date  . Asthma   . Hypertension   . Prostate cancer [facility])     Past Surgical History:  Procedure Laterality Date  . APPENDECTOMY    . HYDROCELE EXCISION Left 12/01/2015   Procedure: REPAIR OF LEFT HYDROCELE;  Surgeon: Raynelle Bring, MD;  Location: [facility];  Service: Urology;  Laterality: Left;  . LYMPHADENECTOMY Bilateral 09/24/2013   Procedure: LYMPHADENECTOMY;  Surgeon: Raynelle Bring, MD;   Location: [facility];  Service: Urology;  Laterality: Bilateral;  . PROSTATE BIOPSY    . ROBOT ASSISTED LAPAROSCOPIC RADICAL PROSTATECTOMY N/A 09/24/2013   Procedure: ROBOTIC ASSISTED LAPAROSCOPIC RADICAL PROSTATECTOMY LEVEL 2;  Surgeon: Raynelle Bring, MD;  Location: [facility];  Service: Urology;  Laterality: N/A;     Current Outpatient Prescriptions  Medication Sig Dispense Refill  . albuterol (PROVENTIL) (2.5 MG/3ML) 0.083% nebulizer solution Take 3 mLs (2.5 mg total) by nebulization every 6 (six) hours as needed for wheezing or shortness of breath. 75 mL 2  . carvedilol (COREG) 3.125 MG tablet Take 1 tablet (3.125 mg total) by mouth 2 (two) times daily with a meal. 60 tablet 1  . cetirizine (ZYRTEC) 10 MG tablet Take 1 tablet (10 mg total) by mouth daily. 30 tablet 11  . isosorbide mononitrate (IMDUR) 30 MG 24 hr tablet Take 1 tablet (30 mg total) by mouth daily. 30 tablet 1  . latanoprost (XALATAN) 0.005 % ophthalmic solution     . TRAVATAN Z 0.004 % SOLN ophthalmic solution      No current facility-administered medications for this visit.     Allergies:   Losartan    Social History:  The patient  reports that he quit smoking about 5 months ago. His smoking use included Cigarettes. He has a 60.00 pack-year smoking history. He  has never used smokeless tobacco. He reports that he drinks alcohol. He reports that he does not use drugs.   Family History:  The patient's family history includes Hypertension in his sister. Mother died from trauma- GSW.  Father unknown.   ROS:  Please see the history of present illness.   Otherwise, review of systems are positive for difficulty tolerating.   All other systems are reviewed and negative.    PHYSICAL EXAM: VS:  BP (!) 160/90   Pulse 68   Ht 5\' 8"  (1.727 m)   Wt 188 lb (85.3 kg)   BMI 28.59 kg/m  , BMI Body mass index is 28.59 kg/m. GEN: Well nourished, well developed, in no acute distress  HEENT: normal  Neck: no JVD, carotid bruits, or  masses Cardiac: RRR; no murmurs, rubs, or gallops,no edema  Respiratory:  clear to auscultation bilaterally, normal work of breathing GI: soft, nontender, nondistended, + BS MS: no deformity or atrophy  Skin: warm and dry, no rash Neuro:  Strength and sensation are intact Psych: euthymic mood, full affect   EKG:   The ekg ordered today demonstrates    Recent Labs: 01/10/2016: ALT 41; Magnesium 2.1 01/19/2016: B Natriuretic Peptide 484.0; Hemoglobin 13.4; Platelets 205 03/10/2016: BUN 16; Creatinine, Ser 1.31; Potassium 4.6; Sodium 143   Lipid Panel    Component Value Date/Time   CHOL 196 06/04/2016 1505   TRIG 148 06/04/2016 1505   HDL 65 06/04/2016 1505   CHOLHDL 3.0 06/04/2016 1505   LDLCALC 101 (H) 06/04/2016 1505     Other studies Reviewed: Additional studies/ records that were reviewed today with results demonstrating: Echo results noted above.   ASSESSMENT AND PLAN:  1. Abnormal echocardiogram/ acute systolic heart failure: Difficult to know time course of LV dysfunction.   Start Coreg and Imdur.  He will need an ischemia workup. We'll plan for right and left heart catheterization. The risks and benefits of the procedure were explained to the patient. All questions were answered. Coronary artery disease would be a possible etiology for his LV dysfunction. His shortness of breath could be an anginal equivalent. 2. HTN: Diuretic for HTN may be helping maintain fluid balance.  Currently, appears euvolemic but he does have some symptoms of orthopnea and dyspnea on exertion. 3. History of smoking: He has stop smoking but gained weight since stopping. 4. CAD: Will encourage hydration pre-and post procedure. Minimize contrast dye usage.   Current medicines are reviewed at length with the patient today.  The patient concerns regarding his medicines were addressed.  The following changes have been made:  No change  Labs/ tests ordered today include:  No orders of the defined  types were placed in this encounter.   Recommend 150 minutes/week of aerobic exercise Low fat, low carb, high fiber diet recommended  Disposition:   FU for cath   Signed, [name], MD  06/07/2016 9:27 AM    [facility] [place], [place],   [address] Phone: [phone]; Fax: [phone]

## 2016-06-10 NOTE — Progress Notes (Addendum)
Received call from [facility], still working on letting air out of TR band as patient has had high BP post-cath. Cath results reviewed, LVEDP 15. She states he's doing much better now but noting some SOB for which he typically uses his inhaler or neb this time of day. H/o 30 years tobacco abuse. Nurse requesting home order for use. Came to see patient - laughing, joking, resting comfortably, BP much improved, pulse ox and HR normal. Exam with diminished BS throughout but no wheezes, rales or rhonchi. Will try neb. If this resolves dyspnea, OK to dc - otherwise I asked nurse to place notify us for any persistent sx. Will also plan to notify oncoming fellow if patient has not yet left at that time. Also asked patient to follow for any residual dyspnea on carvedilol. He will keep [place] informed. Jeily Guthridge PA-C

## 2016-06-11 ENCOUNTER — Encounter (HOSPITAL_COMMUNITY): Payer: Self-pay | Admitting: Interventional Cardiology

## 2016-06-11 MED FILL — Verapamil HCl IV Soln 2.5 MG/ML: INTRAVENOUS | Qty: 2 | Status: AC

## 2016-06-14 ENCOUNTER — Ambulatory Visit (INDEPENDENT_AMBULATORY_CARE_PROVIDER_SITE_OTHER): Payer: PPO | Admitting: Pharmacist

## 2016-06-14 VITALS — BP 142/86 | HR 84

## 2016-06-14 DIAGNOSIS — I10 Essential (primary) hypertension: Secondary | ICD-10-CM

## 2016-06-14 MED ORDER — CARVEDILOL 12.5 MG PO TABS: 12.5000 mg | ORAL_TABLET | Freq: Two times a day (BID) | ORAL | 11 refills | Status: DC

## 2016-06-14 MED ORDER — ROSUVASTATIN CALCIUM 10 MG PO TABS: 10.0000 mg | ORAL_TABLET | Freq: Every day | ORAL | 11 refills | Status: DC

## 2016-06-14 NOTE — Patient Instructions (Addendum)
Increase your carvedilol to 12.5mg  twice a day - I sent in a new prescription so you can take 1 tablet twice a day - this will help your blood pressure. Try to cut back on salt intake too  Start taking Crestor (rosuvastatin) 10mg  once a day for your cholesterol  Follow up with Dr Irish Lack as scheduled

## 2016-06-14 NOTE — Progress Notes (Signed)
Patient ID: Leonard [name]                 DOB: 02-07-1951                      MRN: [id number]     HPI: Leonard [name] is a 66 y.o. male referred by Dr. Irish Lack to HTN clinic. PMH is significant for HTN, asthma, and prostate cancer. Pt underwent left and right heart cath on 06/10/16 which revealed nonobstructive CAD with 40% stenosis of prox RCA and 25% stenosis of dist RCA and ramus lesion. Also revealed nonischemic cardiomyopathy with LVEF 35-45%, likely related to elevated BP. Pt's carvedilol was increased to 6.25mg  BID and pt was referred to HTN clinic for BP management with request for electrolyte check as well.  Pt reports feeling well overall. He denies dizziness, blurred vision, headache, or falls. He does not check his BP at home. Reports compliance with his medications. Stays active playing golf but does eat salt in his diet frequently. He also enjoys red meat. He is not currently on lipid lowering therapy.  Current HTN meds: carvedilol 6.25mg  BID, Imdur 30mg  daily BP goal: <130/28mmHg  Family History: Hypertension in his sister. Mother died from trauma - GSW. Father unknown.  Social History: History of 30 years of tobacco use - quit 6 months ago. Denies illicit drug use. Does drink alcohol.  Diet: States he eats everything and likes salt. Likes red meat a lot.  Exercise: Plays golf 2-3x per week. 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  Labs: TC 196, TG 148, HDL 65, LDL 101 (no therapy)  Wt Readings from Last 3 Encounters:  06/10/16 188 lb (85.3 kg)  06/07/16 188 lb (85.3 kg)  06/04/16 185 lb 8 oz (84.1 kg)   BP Readings from Last 3 Encounters:  06/10/16 140/72  06/07/16 (!) 160/90  06/04/16 (!) 169/82   Pulse Readings from Last 3 Encounters:  06/10/16 80  06/07/16 68  06/04/16 88    Renal function: Estimated Creatinine Clearance: 63.9 mL/min (by C-G formula based on SCr of 1.21 mg/dL).  Past Medical History:    Diagnosis Date  . Asthma   . Hypertension   . Prostate cancer [facility])     Current Outpatient Prescriptions on File Prior to Visit  Medication Sig Dispense Refill  . albuterol (PROVENTIL HFA;VENTOLIN HFA) 108 (90 Base) MCG/ACT inhaler Inhale 2 puffs into the lungs every 6 (six) hours as needed for wheezing or shortness of breath.    Marland Kitchen albuterol (PROVENTIL) (2.5 MG/3ML) 0.083% nebulizer solution Take 3 mLs (2.5 mg total) by nebulization every 6 (six) hours as needed for wheezing or shortness of breath. 75 mL 2  . carvedilol (COREG) 3.125 MG tablet Take 2 tablets (6.25 mg total) by mouth 2 (two) times daily with a meal. 60 tablet 1  . cetirizine (ZYRTEC) 10 MG tablet Take 1 tablet (10 mg total) by mouth daily. (Patient not taking: Reported on 06/10/2016) 30 tablet 11  . isosorbide mononitrate (IMDUR) 30 MG 24 hr tablet Take 1 tablet (30 mg total) by mouth daily. 30 tablet 1  . latanoprost (XALATAN) 0.005 % ophthalmic solution Place 1 drop into both eyes at bedtime.     . pilocarpine (PILOCAR) 1 % ophthalmic solution Place 1 drop into the right eye 2 (two) times daily.    . prednisoLONE acetate (PRED FORTE) 1 % ophthalmic suspension Place 1 drop  into the right eye 4 (four) times daily.    Marland Kitchen PROLENSA 0.07 % SOLN Place 1 drop into the right eye 2 (two) times daily.     No current facility-administered medications on file prior to visit.     Allergies  Allergen Reactions  . Losartan Rash and Other (See Comments)    Abdominal and leg rashes noted in 12/17     Assessment/Plan:  1. Hypertension - BP improved although still above goal <130/90mmHg. Will increase carvedilol to 12.5mg  BID since HR allows for dose titration. Pt prefers not to start a new medication for his BP today. He will work to cut back on salt intake.   2. Hyperlipidemia - LDL 101 at baseline above goal 70mg /dL given new diagnosis of non-obstructive CAD with most recent cath revealing 25-40% stenosis in multiple vessels. Pt is  willing to start Crestor 10mg  daily which should lower LDL 30-50% and bring LDL to goal.  Checking BMET today per request from Dr Irish Lack. Pt has f/u with Dr Irish Lack in 10 days for post-cath follow up. Pt to follow up in pharmacy clinic as needed.   Kiley Torrence E. Naturi Alarid, PharmD, CPP, [facility] [address], [place], [place] [address] Phone: [phone]; Fax: [phone] 06/14/2016 3:59 PM

## 2016-06-15 LAB — BASIC METABOLIC PANEL
BUN/Creatinine Ratio: 15 (ref 10–24)
BUN: 19 mg/dL (ref 8–27)
CO2: 22 mmol/L (ref 18–29)
Calcium: 9.2 mg/dL (ref 8.6–10.2)
Chloride: 103 mmol/L (ref 96–106)
Creatinine, Ser: 1.26 mg/dL (ref 0.76–1.27)
GFR calc Af Amer: 68 mL/min/{1.73_m2} (ref >59)
GFR calc non Af Amer: 59 mL/min/{1.73_m2} — ABNORMAL LOW (ref >59)
Glucose: 85 mg/dL (ref 65–99)
Potassium: 4.3 mmol/L (ref 3.5–5.2)
Sodium: 142 mmol/L (ref 134–144)

## 2016-06-25 ENCOUNTER — Ambulatory Visit (INDEPENDENT_AMBULATORY_CARE_PROVIDER_SITE_OTHER): Payer: PPO | Admitting: Interventional Cardiology

## 2016-06-25 ENCOUNTER — Encounter: Payer: Self-pay | Admitting: Interventional Cardiology

## 2016-06-25 VITALS — BP 142/86 | HR 80 | Ht 68.0 in | Wt 188.0 lb

## 2016-06-25 DIAGNOSIS — E785 Hyperlipidemia, unspecified: Secondary | ICD-10-CM | POA: Diagnosis not present

## 2016-06-25 DIAGNOSIS — I5020 Unspecified systolic (congestive) heart failure: Secondary | ICD-10-CM | POA: Diagnosis not present

## 2016-06-25 DIAGNOSIS — Z87891 Personal history of nicotine dependence: Secondary | ICD-10-CM | POA: Insufficient documentation

## 2016-06-25 DIAGNOSIS — I10 Essential (primary) hypertension: Secondary | ICD-10-CM

## 2016-06-25 MED ORDER — CARVEDILOL 25 MG PO TABS: 25.0000 mg | ORAL_TABLET | Freq: Two times a day (BID) | ORAL | 3 refills | Status: DC

## 2016-06-25 MED ORDER — CARVEDILOL 12.5 MG PO TABS: 12.5000 mg | ORAL_TABLET | Freq: Two times a day (BID) | ORAL | 3 refills | Status: DC

## 2016-06-25 NOTE — Progress Notes (Signed)
Cardiology Office Note   Date:  06/25/2016   ID:  Leonard [name], DOB [birth year], MRN [id number]  PCP:  Maryellen Pile, MD    Chief Complaint  Patient presents with  . Follow-up  NICM, HTN   Wt Readings from Last 3 Encounters:  06/25/16 188 lb (85.3 kg)  06/10/16 188 lb (85.3 kg)  06/07/16 188 lb (85.3 kg)       History of Present Illness: Leonard [name] is a 66 y.o. male  Who had an abnormal echo which prompted cath in 5/18.  Cath showed:   Prox RCA lesion, 40 %stenosed.  Dist RCA lesion, 25 %stenosed.  Ramus lesion, 25 %stenosed.  Mid LAD lesion, 20 %stenosed.  There is moderate left ventricular systolic dysfunction.  LV end diastolic pressure is mildly elevated.  The left ventricular ejection fraction is 35-45% by visual estimate.  There is no aortic valve stenosis.  Ao sat 93%, PA sat 61%. CO 4.5 L/min. CI 2.2. Normal pulmonary artery pressures.  No AAA. Mild left renal artery stenosis. No right renal artery stenosis.  BP control was stressed afte rthe cath.  Meds were adjusted.  Carvedilol was increased.  He had an ACE-I cough in the past.  He had a rash with Losartan.    Past Medical History:  Diagnosis Date  . Asthma   . Hypertension   . Prostate cancer [facility])     Past Surgical History:  Procedure Laterality Date  . APPENDECTOMY    . HYDROCELE EXCISION Left 12/01/2015   Procedure: REPAIR OF LEFT HYDROCELE;  Surgeon: Raynelle Bring, MD;  Location: [facility];  Service: Urology;  Laterality: Left;  . LYMPHADENECTOMY Bilateral 09/24/2013   Procedure: LYMPHADENECTOMY;  Surgeon: Raynelle Bring, MD;  Location: [facility];  Service: Urology;  Laterality: Bilateral;  . PROSTATE BIOPSY    . RIGHT/LEFT HEART CATH AND CORONARY ANGIOGRAPHY N/A 06/10/2016   Procedure: Right/Left Heart Cath and Coronary Angiography;  Surgeon: Jettie Booze, MD;  Location: [facility];  Service: Cardiovascular;  Laterality: N/A;  . ROBOT ASSISTED LAPAROSCOPIC RADICAL  PROSTATECTOMY N/A 09/24/2013   Procedure: ROBOTIC ASSISTED LAPAROSCOPIC RADICAL PROSTATECTOMY LEVEL 2;  Surgeon: Raynelle Bring, MD;  Location: [facility];  Service: Urology;  Laterality: N/A;     Current Outpatient Prescriptions  Medication Sig Dispense Refill  . albuterol (PROVENTIL HFA;VENTOLIN HFA) 108 (90 Base) MCG/ACT inhaler Inhale 2 puffs into the lungs every 6 (six) hours as needed for wheezing or shortness of breath.    Marland Kitchen albuterol (PROVENTIL) (2.5 MG/3ML) 0.083% nebulizer solution Take 3 mLs (2.5 mg total) by nebulization every 6 (six) hours as needed for wheezing or shortness of breath. 75 mL 2  . carvedilol (COREG) 3.125 MG tablet     . isosorbide mononitrate (IMDUR) 30 MG 24 hr tablet Take 1 tablet (30 mg total) by mouth daily. 30 tablet 1  . latanoprost (XALATAN) 0.005 % ophthalmic solution Place 1 drop into both eyes at bedtime.     . pilocarpine (PILOCAR) 1 % ophthalmic solution Place 1 drop into the right eye 2 (two) times daily.    . prednisoLONE acetate (PRED FORTE) 1 % ophthalmic suspension Place 1 drop into the right eye 4 (four) times daily.    Marland Kitchen PROLENSA 0.07 % SOLN Place 1 drop into the right eye 2 (two) times daily.    . rosuvastatin (CRESTOR) 10 MG tablet Take 1 tablet (10 mg total) by mouth daily. 30 tablet 11   No current facility-administered medications for  this visit.     Allergies:   Losartan    Social History:  The patient  reports that he quit smoking about 6 months ago. His smoking use included Cigarettes. He has a 60.00 pack-year smoking history. He has never used smokeless tobacco. He reports that he drinks alcohol. He reports that he does not use drugs.   Family History:  The patient's family history includes Hypertension in his sister.    ROS:  Please see the history of present illness.   Otherwise, review of systems are positive for improved BP.   All other systems are reviewed and negative.    PHYSICAL EXAM: VS:  BP (!) 142/86   Pulse 80   Ht 5'  8" (1.727 m)   Wt 188 lb (85.3 kg)   SpO2 96%   BMI 28.59 kg/m  , BMI Body mass index is 28.59 kg/m. GEN: Well nourished, well developed, in no acute distress  HEENT: normal  Neck: no JVD, carotid bruits, or masses Cardiac: RRR; no murmurs, rubs, or gallops,no edema ; 2+ right radial pulse; no bruising Respiratory:  clear to auscultation bilaterally, normal work of breathing GI: soft, nontender, nondistended, + BS MS: no deformity or atrophy  Skin: warm and dry, no rash Neuro:  Strength and sensation are intact Psych: euthymic mood, full affect   EKG:   The ekg ordered today demonstrates NSR, LVH   Recent Labs: 01/10/2016: ALT 41; Magnesium 2.1 01/19/2016: B Natriuretic Peptide 484.0; Hemoglobin 13.4 06/07/2016: Platelets 218 06/14/2016: BUN 19; Creatinine, Ser 1.26; Potassium 4.3; Sodium 142   Lipid Panel    Component Value Date/Time   CHOL 196 06/04/2016 1505   TRIG 148 06/04/2016 1505   HDL 65 06/04/2016 1505   CHOLHDL 3.0 06/04/2016 1505   LDLCALC 101 (H) 06/04/2016 1505     Other studies Reviewed: Additional studies/ records that were reviewed today with results demonstrating: cath results reviewed.   ASSESSMENT AND PLAN:  1. NICM: :   Decreased LV function, should improve with BP control.  Intolerant of ACE-I in the past.  Rash with losartan.  Appears euvolemic. 2. Hypertensive heart disease:  Increase Carvedilol to 12.5 mg BID.  He was only taking 12.5 mg daily 3. LDL at 101.  Started on rosuvastatin.  Check lipids in a few months.  4. Tobacco abuse: quit in 2017.  Gained some weight since that time.   Current medicines are reviewed at length with the patient today.  The patient concerns regarding his medicines were addressed.  The following changes have been made:  Increase Carvedilol to 25 mg BID  Labs/ tests ordered today include: CMet, lipids in 8 weeeks No orders of the defined types were placed in this encounter.   Recommend 150 minutes/week of  aerobic exercise Low fat, low carb, high fiber diet recommended  Disposition:   FU in 3-4 months   Signed, [name], MD  06/25/2016 8:34 AM    [facility] [place], [place], [place]  [address] Phone: [phone]; Fax: [phone]

## 2016-06-25 NOTE — Patient Instructions (Addendum)
Medication Instructions:  Your physician has recommended you make the following change in your medication:  1. Carvedilol increased to 12.5 mg twice a day. Take one 12.5 mg tablet of carvedilol two times a day.   Labwork: Your physician recommends that you return for lab work in 2 months for FASTING Lipids and CMET.   Testing/Procedures: None ordered.  Follow-Up: Your physician recommends that you schedule a follow-up appointment in: 3-4 months with Dr. Irish Lack.   Any Other Special Instructions Will Be Listed Below (If Applicable).     If you need a refill on your cardiac medications before your next appointment, please call your pharmacy.

## 2016-08-23 ENCOUNTER — Other Ambulatory Visit: Payer: Medicare Other | Admitting: *Deleted

## 2016-08-23 DIAGNOSIS — E785 Hyperlipidemia, unspecified: Secondary | ICD-10-CM

## 2016-08-23 DIAGNOSIS — I10 Essential (primary) hypertension: Secondary | ICD-10-CM

## 2016-08-27 LAB — COMPREHENSIVE METABOLIC PANEL
ALT: 19 IU/L (ref 0–44)
AST: 29 IU/L (ref 0–40)
Albumin/Globulin Ratio: 1.5 (ref 1.2–2.2)
Albumin: 4.4 g/dL (ref 3.6–4.8)
Alkaline Phosphatase: 73 IU/L (ref 39–117)
BUN/Creatinine Ratio: 13 (ref 10–24)
BUN: 17 mg/dL (ref 8–27)
Bilirubin Total: 0.5 mg/dL (ref 0.0–1.2)
CO2: 25 mmol/L (ref 20–29)
Calcium: 9.8 mg/dL (ref 8.6–10.2)
Chloride: 105 mmol/L (ref 96–106)
Creatinine, Ser: 1.29 mg/dL — ABNORMAL HIGH (ref 0.76–1.27)
GFR calc Af Amer: 66 mL/min/{1.73_m2} (ref >59)
GFR calc non Af Amer: 57 mL/min/{1.73_m2} — ABNORMAL LOW (ref >59)
Globulin, Total: 3 g/dL (ref 1.5–4.5)
Glucose: 103 mg/dL — ABNORMAL HIGH (ref 65–99)
Potassium: 4.4 mmol/L (ref 3.5–5.2)
Sodium: 144 mmol/L (ref 134–144)
Total Protein: 7.4 g/dL (ref 6.0–8.5)

## 2016-08-27 LAB — LIPID PANEL
Chol/HDL Ratio: 2.3 ratio (ref 0.0–5.0)
Cholesterol, Total: 144 mg/dL (ref 100–199)
HDL: 64 mg/dL (ref >39)
LDL Calculated: 67 mg/dL (ref 0–99)
Triglycerides: 66 mg/dL (ref 0–149)
VLDL Cholesterol Cal: 13 mg/dL (ref 5–40)

## 2016-08-31 ENCOUNTER — Other Ambulatory Visit: Payer: Self-pay

## 2016-08-31 MED ORDER — CARVEDILOL 12.5 MG PO TABS: 12.5000 mg | ORAL_TABLET | Freq: Two times a day (BID) | ORAL | 3 refills | Status: DC

## 2016-08-31 MED ORDER — ROSUVASTATIN CALCIUM 10 MG PO TABS: 10.0000 mg | ORAL_TABLET | Freq: Every day | ORAL | 3 refills | Status: DC

## 2016-08-31 MED ORDER — ISOSORBIDE MONONITRATE ER 30 MG PO TB24: 30.0000 mg | ORAL_TABLET | Freq: Every day | ORAL | 3 refills | Status: DC

## 2016-09-14 ENCOUNTER — Ambulatory Visit (INDEPENDENT_AMBULATORY_CARE_PROVIDER_SITE_OTHER): Payer: Medicare Other

## 2016-09-14 ENCOUNTER — Encounter: Payer: Self-pay | Admitting: Sports Medicine

## 2016-09-14 ENCOUNTER — Ambulatory Visit (INDEPENDENT_AMBULATORY_CARE_PROVIDER_SITE_OTHER): Payer: Medicare Other | Admitting: Sports Medicine

## 2016-09-14 VITALS — BP 156/122 | HR 79 | Resp 16 | Ht 68.0 in | Wt 180.0 lb

## 2016-09-14 DIAGNOSIS — M10071 Idiopathic gout, right ankle and foot: Secondary | ICD-10-CM

## 2016-09-14 DIAGNOSIS — M79671 Pain in right foot: Secondary | ICD-10-CM

## 2016-09-14 LAB — CBC WITH DIFFERENTIAL/PLATELET
Basophils Absolute: 0 cells/uL (ref 0–200)
Basophils Relative: 0 %
Eosinophils Absolute: 300 cells/uL (ref 15–500)
Eosinophils Relative: 3 %
HCT: 43.5 % (ref 38.5–50.0)
Hemoglobin: 14.6 g/dL (ref 13.2–17.1)
Lymphocytes Relative: 18 %
Lymphs Abs: 1800 cells/uL (ref 850–3900)
MCH: 32.4 pg (ref 27.0–33.0)
MCHC: 33.6 g/dL (ref 32.0–36.0)
MCV: 96.7 fL (ref 80.0–100.0)
MPV: 9.3 fL (ref 7.5–12.5)
Monocytes Absolute: 1400 cells/uL — ABNORMAL HIGH (ref 200–950)
Monocytes Relative: 14 %
Neutro Abs: 6500 cells/uL (ref 1500–7800)
Neutrophils Relative %: 65 %
Platelets: 232 10*3/uL (ref 140–400)
RBC: 4.5 MIL/uL (ref 4.20–5.80)
RDW: 13.4 % (ref 11.0–15.0)
WBC: 10 10*3/uL (ref 3.8–10.8)

## 2016-09-14 MED ORDER — METHYLPREDNISOLONE 4 MG PO TBPK: ORAL_TABLET | ORAL | 0 refills | Status: DC

## 2016-09-14 MED ORDER — COLCHICINE 0.6 MG PO TABS: 0.6000 mg | ORAL_TABLET | Freq: Two times a day (BID) | ORAL | 0 refills | Status: DC

## 2016-09-14 NOTE — Patient Instructions (Signed)

## 2016-09-14 NOTE — Progress Notes (Signed)
Subjective: Leonard [name] is a 66 y.o. male patient who presents to office for evaluation of Right foot pain. Patient complains of progressive pain especially over the last few days at big toe joint. Ranks pain 9/10. Admits to warmth, redness, and swelling to the area that is unrelieved. Patient has tried rest with no relief in symptoms. Admits to change in diet eating [place]. Patient denies any other pedal complaints.   Patient Active Problem List   Diagnosis Date Noted  . History of tobacco abuse 06/25/2016  . Allergic sinusitis 04/17/2016  . Asthma 01/19/2016  . CKD (chronic kidney disease) stage 2, GFR 60-89 ml/min 01/19/2016  . Aspiration pneumonitis ([place])   . Essential hypertension   . Heart failure with reduced ejection fraction ([place]) 01/10/2016  . Malignant neoplasm of prostate ([place]) 07/03/2013    Current Outpatient Prescriptions on File Prior to Visit  Medication Sig Dispense Refill  . albuterol (PROVENTIL HFA;VENTOLIN HFA) 108 (90 Base) MCG/ACT inhaler Inhale 2 puffs into the lungs every 6 (six) hours as needed for wheezing or shortness of breath.    Marland Kitchen albuterol (PROVENTIL) (2.5 MG/3ML) 0.083% nebulizer solution Take 3 mLs (2.5 mg total) by nebulization every 6 (six) hours as needed for wheezing or shortness of breath. 75 mL 2  . carvedilol (COREG) 12.5 MG tablet Take 1 tablet (12.5 mg total) by mouth 2 (two) times daily. 180 tablet 3  . isosorbide mononitrate (IMDUR) 30 MG 24 hr tablet Take 1 tablet (30 mg total) by mouth daily. 90 tablet 3  . latanoprost (XALATAN) 0.005 % ophthalmic solution Place 1 drop into both eyes at bedtime.     . pilocarpine (PILOCAR) 1 % ophthalmic solution Place 1 drop into the right eye 2 (two) times daily.    . prednisoLONE acetate (PRED FORTE) 1 % ophthalmic suspension Place 1 drop into the right eye 4 (four) times daily.    Marland Kitchen PROLENSA 0.07 % SOLN Place 1 drop into the right eye 2 (two) times daily.    . rosuvastatin (CRESTOR) 10 MG tablet Take 1  tablet (10 mg total) by mouth daily. 90 tablet 3   No current facility-administered medications on file prior to visit.     Allergies  Allergen Reactions  . Losartan Rash and Other (See Comments)    Abdominal and leg rashes noted in 12/17    Objective:  General: Alert and oriented x3 in no acute distress  Dermatology: Focal Swelling, warmth, redness present on the Right foot at 1st MTPJ, No open lesions bilateral lower extremities, no webspace macerations, no ecchymosis bilateral, all nails x 10 are well manicured.  Vascular: Dorsalis Pedis and Posterior Tibial pedal pulses 1/4, Capillary Fill Time 3 seconds,(+) pedal hair growth bilateral,Temperature gradient increased over the Right 1st MTPJ  Neurology: Gross sensation intact via light touch bilateral, (- )Tinels sign bilateral.   Musculoskeletal: There is tenderness with palpation at 1st MTPJ on Right>Left foot at inflamed bunion,No pain with calf compression bilateral. All joint range of motion is within normal limits except at the right 1st MTPJ where there is pain and limiation, Strength within normal limits in all groups bilateral.   Gait: Unassisted, Antalgic gait avoiding weight on left/right foot  Xrays  Right Foot    Impression: swelling 1st MTPJ with bunion, pes planus foot type. No other issues.        Assessment and Plan: Problem List Items Addressed This Visit    None    Visit Diagnoses    Foot pain,  right    -  Primary   Relevant Medications   methylPREDNISolone (MEDROL DOSEPAK) 4 MG TBPK tablet   colchicine 0.6 MG tablet   Other Relevant Orders   DG Foot Complete Right   Acute idiopathic gout of right foot       Relevant Medications   methylPREDNISolone (MEDROL DOSEPAK) 4 MG TBPK tablet   colchicine 0.6 MG tablet   Other Relevant Orders   CBC with Differential   Uric acid   C-reactive protein   Sedimentation Rate      -Complete examination performed -Xrays reviewed -Discussed treatement options for  gouty arthritis and gout education provided -Patient declined injeciton -Rx Colchicine 0.6mg  and medrol dose pack to take if no additions relief with colchicine -Ordered arthritic lab panel; will call patient with results if abnormal -Advised patient to call if symptoms are not improved within 1 week -Patient to return in 3 weeks for re-check/further discussion for long term management of gout or sooner if condition worsens.  Landis Martins, DPM

## 2016-09-14 NOTE — Progress Notes (Signed)
   Subjective:    Patient ID: Leonard [name], male    DOB: [birth year], 66 y.o.   MRN: [id number]  HPI    Review of Systems  HENT: Positive for sinus pain.   Eyes: Positive for visual disturbance.  Respiratory: Positive for cough, shortness of breath and wheezing.   All other systems reviewed and are negative.      Objective:   Physical Exam        Assessment & Plan:

## 2016-09-15 LAB — C-REACTIVE PROTEIN: CRP: 7.9 mg/L (ref <8.0)

## 2016-09-15 LAB — SEDIMENTATION RATE: Sed Rate: 10 mm/hr (ref 0–20)

## 2016-09-15 LAB — URIC ACID: Uric Acid, Serum: 7.4 mg/dL (ref 4.0–8.0)

## 2016-09-17 ENCOUNTER — Telehealth: Payer: Self-pay | Admitting: *Deleted

## 2016-09-17 NOTE — Telephone Encounter (Addendum)
-----   Message from Landis Martins, [place] sent at 09/15/2016 12:32 PM EDT ----- Can you let patient know that his blood work came back negative for gout so likely his pain was because his bunion was inflamed however he will need to follow up with his PCP because his monocytes were high and this could be for several reasons: chronic inflammatory disease, such as inflammatory bowel disease. a parasitic or viral infection. ... a collagen vascular disease, such as lupus, vasculitis, or rheumatoid arthritis, etc.  Thanks Dr. Cannon Kettle.09/17/2016-Left message informing pt the labs had been reviewed and that Dr. Cannon Kettle wanted him to see his primary care physician and to call our office with PCP name and phone number and we would fax the results so they would be there for review.

## 2016-09-27 ENCOUNTER — Encounter: Payer: Self-pay | Admitting: Interventional Cardiology

## 2016-09-27 ENCOUNTER — Ambulatory Visit (INDEPENDENT_AMBULATORY_CARE_PROVIDER_SITE_OTHER): Payer: Medicare Other | Admitting: Interventional Cardiology

## 2016-09-27 VITALS — BP 148/82 | HR 60 | Ht 68.0 in | Wt 182.0 lb

## 2016-09-27 DIAGNOSIS — I5022 Chronic systolic (congestive) heart failure: Secondary | ICD-10-CM | POA: Diagnosis not present

## 2016-09-27 DIAGNOSIS — E782 Mixed hyperlipidemia: Secondary | ICD-10-CM | POA: Diagnosis not present

## 2016-09-27 DIAGNOSIS — Z720 Tobacco use: Secondary | ICD-10-CM | POA: Diagnosis not present

## 2016-09-27 DIAGNOSIS — I119 Hypertensive heart disease without heart failure: Secondary | ICD-10-CM

## 2016-09-27 MED ORDER — CARVEDILOL 25 MG PO TABS: 25.0000 mg | ORAL_TABLET | Freq: Two times a day (BID) | ORAL | 3 refills | Status: DC

## 2016-09-27 MED ORDER — AMLODIPINE BESYLATE 5 MG PO TABS: 5.0000 mg | ORAL_TABLET | Freq: Every day | ORAL | 3 refills | Status: DC

## 2016-09-27 NOTE — Progress Notes (Signed)
Cardiology Office Note   Date:  09/27/2016   ID:  Leonard [name], DOB 07-Jun-1950, MRN [id number]  PCP:  Maryellen Pile, MD    No chief complaint on file.    Wt Readings from Last 3 Encounters:  09/27/16 182 lb (82.6 kg)  09/14/16 180 lb (81.6 kg)  06/25/16 188 lb (85.3 kg)       History of Present Illness: Leonard [name] is a 66 y.o. male  Who had an abnormal echo which prompted cath in 5/18.  Cath showed:   Prox RCA lesion, 40 %stenosed.  Dist RCA lesion, 25 %stenosed.  Ramus lesion, 25 %stenosed.  Mid LAD lesion, 20 %stenosed.  There is moderate left ventricular systolic dysfunction.  LV end diastolic pressure is mildly elevated.  The left ventricular ejection fraction is 35-45% by visual estimate.  There is no aortic valve stenosis.  Ao sat 93%, PA sat 61%. CO 4.5 L/min. CI 2.2. Normal pulmonary artery pressures.  No AAA. Mild left renal artery stenosis. No right renal artery stenosis.  BP control was stressed afte rthe cath.  Meds were adjusted.  Carvedilol was increased.  He had an ACE-I cough in the past.  He had a rash with Losartan.  Denies : Chest pain. Dizziness. Leg edema. Nitroglycerin use. Orthopnea. Palpitations. Paroxysmal nocturnal dyspnea. Syncope.   Mild SHOB since he started back smoking.  BP increased at home... 902 - 409 systolic.      Past Medical History:  Diagnosis Date  . Asthma   . Hypertension   . Prostate cancer [facility])     Past Surgical History:  Procedure Laterality Date  . APPENDECTOMY    . HYDROCELE EXCISION Left 12/01/2015   Procedure: REPAIR OF LEFT HYDROCELE;  Surgeon: Raynelle Bring, MD;  Location: [facility];  Service: Urology;  Laterality: Left;  . LYMPHADENECTOMY Bilateral 09/24/2013   Procedure: LYMPHADENECTOMY;  Surgeon: Raynelle Bring, MD;  Location: [facility];  Service: Urology;  Laterality: Bilateral;  . PROSTATE BIOPSY    . RIGHT/LEFT HEART CATH AND CORONARY ANGIOGRAPHY N/A 06/10/2016   Procedure:  Right/Left Heart Cath and Coronary Angiography;  Surgeon: Jettie Booze, MD;  Location: [facility];  Service: Cardiovascular;  Laterality: N/A;  . ROBOT ASSISTED LAPAROSCOPIC RADICAL PROSTATECTOMY N/A 09/24/2013   Procedure: ROBOTIC ASSISTED LAPAROSCOPIC RADICAL PROSTATECTOMY LEVEL 2;  Surgeon: Raynelle Bring, MD;  Location: [facility];  Service: Urology;  Laterality: N/A;     Current Outpatient Prescriptions  Medication Sig Dispense Refill  . albuterol (PROVENTIL HFA;VENTOLIN HFA) 108 (90 Base) MCG/ACT inhaler Inhale 2 puffs into the lungs every 6 (six) hours as needed for wheezing or shortness of breath.    Marland Kitchen albuterol (PROVENTIL) (2.5 MG/3ML) 0.083% nebulizer solution Take 3 mLs (2.5 mg total) by nebulization every 6 (six) hours as needed for wheezing or shortness of breath. 75 mL 2  . carvedilol (COREG) 12.5 MG tablet Take 1 tablet (12.5 mg total) by mouth 2 (two) times daily. 180 tablet 3  . COMBIGAN 0.2-0.5 % ophthalmic solution Place 1 drop into both eyes daily.  1  . isosorbide mononitrate (IMDUR) 30 MG 24 hr tablet Take 1 tablet (30 mg total) by mouth daily. 90 tablet 3  . latanoprost (XALATAN) 0.005 % ophthalmic solution Place 1 drop into both eyes at bedtime.     Marland Kitchen PROLENSA 0.07 % SOLN Place 1 drop into the right eye 2 (two) times daily.    . rosuvastatin (CRESTOR) 10 MG tablet Take 1 tablet (10 mg  total) by mouth daily. 90 tablet 3   No current facility-administered medications for this visit.     Allergies:   Losartan    Social History:  The patient  reports that he has been smoking Cigarettes.  He has a 60.00 pack-year smoking history. He has never used smokeless tobacco. He reports that he drinks alcohol. He reports that he does not use drugs.   Family History:  The patient's family history includes Hypertension in his sister.    ROS:  Please see the history of present illness.   Otherwise, review of systems are positive for resuming smoking.   All other systems are  reviewed and negative.    PHYSICAL EXAM: VS:  BP (!) 148/82   Pulse 60   Ht 5\' 8"  (1.727 m)   Wt 182 lb (82.6 kg)   BMI 27.67 kg/m  , BMI Body mass index is 27.67 kg/m. GEN: Well nourished, well developed, in no acute distress  HEENT: normal  Neck: no JVD, carotid bruits, or masses Cardiac: RRR; 2/6 early systolic murmur, no rubs, or gallops,no edema  Respiratory:  clear to auscultation bilaterally, normal work of breathing GI: soft, nontender, nondistended, + BS MS: no deformity or atrophy  Skin: warm and dry, no rash Neuro:  Strength and sensation are intact Psych: euthymic mood, full affect    Recent Labs: 01/10/2016: Magnesium 2.1 01/19/2016: B Natriuretic Peptide 484.0 08/23/2016: ALT 19; BUN 17; Creatinine, Ser 1.29; Potassium 4.4; Sodium 144 09/14/2016: Hemoglobin 14.6; Platelets 232   Lipid Panel    Component Value Date/Time   CHOL 144 08/23/2016 1028   TRIG 66 08/23/2016 1028   HDL 64 08/23/2016 1028   CHOLHDL 2.3 08/23/2016 1028   LDLCALC 67 08/23/2016 1028     Other studies Reviewed: Additional studies/ records that were reviewed today with results demonstrating: Cath results from May 2018 and echo result from March 2018.   ASSESSMENT AND PLAN:  1. NICM, chronic systolic heart failure: Decreased LV function.  He has been on medical therapy.  EF was in the 35-45% range by both echo and cath In the first half of 2018.  2. Hypertensive heart disease:  Continue aggressive blood pressure control.  He thinks he is taking 25 mg BID of Coreg, using his 12.5 mg tabs.  BP still high.  Will add amlodipine 5 mg daily and have him f/u in HTN clinic. 3. Tobacco abuse: Quit back in 2017.  Restarted.  He will try agin by quitting cold [place].   4. Hyperlipidemia: Started on rosuvastatin. Lipids in July 2018 much improved.   Current medicines are reviewed at length with the patient today.  The patient concerns regarding his medicines were addressed.  The following changes  have been made:  Add amlodipine  Labs/ tests ordered today include:  No orders of the defined types were placed in this encounter.   Recommend 150 minutes/week of aerobic exercise Low fat, low carb, high fiber diet recommended  Disposition:   FU in 2-3 weeks, HTN clinic   Signed, [name], MD  09/27/2016 8:47 AM    [facility] [address], [place], [place]  [address] Phone: [phone]; Fax: [phone]

## 2016-09-27 NOTE — Patient Instructions (Signed)
Medication Instructions:  Your physician has recommended you make the following change in your medication:   1. Take carvedilol 25 mg (1 tablet) twice a day.  2. START amlodipine 5 mg once daily.  Labwork: None ordered  Testing/Procedures: None ordered  Follow-Up: Your physician wants you to refer you follow-up in the Hypertension Clinic in 2-3 weeks.   Any Other Special Instructions Will Be Listed Below (If Applicable).     If you need a refill on your cardiac medications before your next appointment, please call your pharmacy.

## 2016-10-13 ENCOUNTER — Ambulatory Visit: Payer: Medicare Other

## 2016-10-13 NOTE — Progress Notes (Deleted)
Patient ID: Leonard [name]                 DOB: 1950-12-12                      MRN: [id number]     HPI: Leonard [name] is a 66 y.o. male referred by Dr. Irish Lack to HTN clinic. PMH is significant for HTN, asthma, and prostate cancer.  Pt underwent left and right heart cath on 06/10/16 which revealed nonobstructive CAD with 40% stenosis of prox RCA and 25% stenosis of dist RCA and ramus lesion. Also revealed nonischemic cardiomyopathy with LVEF 35-45%, likely related to elevated BP.  Patient reports feeling ***. He *** dizziness, blurred vision, headache, or falls. He does *** check his BP at home. He reports *** compliance with his medications. He exercises by *** and has been making the following *** changes in his diet. He is currently on moderate intensity statin therapy.  Current HTN meds:  amlodipine 5 mg daily carvedilol 25 mg twice daily imdur 30 mg daily  Current HLD meds:  Crestor 10 mg   Lipid Panel (08/23/16):  TG: 66, HDL: 64, LDL: 67  Previously tried: ACEi (cough), losartan (rash)  BP goal: <130/80 mmHg  Family History: Hypertension in his sister. Mother died from trauma - GSW. Father unknown.  Social History:  History of 30 years of tobacco use - quit 6 months ago. Denies illicit drug use. Does drink alcohol.  Diet: States he eats everything and likes salt. Likes red meat a lot.  Exercise: Plays golf 2-3x per week. 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  Wt Readings from Last 3 Encounters:  09/27/16 182 lb (82.6 kg)  09/14/16 180 lb (81.6 kg)  06/25/16 188 lb (85.3 kg)   BP Readings from Last 3 Encounters:  09/27/16 (!) 148/82  09/14/16 (!) 156/122  06/25/16 (!) 142/86   Pulse Readings from Last 3 Encounters:  09/27/16 60  09/14/16 79  06/25/16 80    Renal function: CrCl cannot be calculated (Patient's most recent lab result is older than the maximum 21 days allowed.).  Past Medical History:  Diagnosis  Date  . Asthma   . Hypertension   . Prostate cancer [facility])     Current Outpatient Prescriptions on File Prior to Visit  Medication Sig Dispense Refill  . albuterol (PROVENTIL HFA;VENTOLIN HFA) 108 (90 Base) MCG/ACT inhaler Inhale 2 puffs into the lungs every 6 (six) hours as needed for wheezing or shortness of breath.    Marland Kitchen albuterol (PROVENTIL) (2.5 MG/3ML) 0.083% nebulizer solution Take 3 mLs (2.5 mg total) by nebulization every 6 (six) hours as needed for wheezing or shortness of breath. 75 mL 2  . amLODipine (NORVASC) 5 MG tablet Take 1 tablet (5 mg total) by mouth daily. 90 tablet 3  . carvedilol (COREG) 25 MG tablet Take 1 tablet (25 mg total) by mouth 2 (two) times daily. 180 tablet 3  . COMBIGAN 0.2-0.5 % ophthalmic solution Place 1 drop into both eyes daily.  1  . isosorbide mononitrate (IMDUR) 30 MG 24 hr tablet Take 1 tablet (30 mg total) by mouth daily. 90 tablet 3  . latanoprost (XALATAN) 0.005 % ophthalmic solution Place 1 drop into both eyes at bedtime.     Marland Kitchen PROLENSA 0.07 % SOLN Place 1 drop into the right eye 2 (two) times daily.    . rosuvastatin (CRESTOR) 10  MG tablet Take 1 tablet (10 mg total) by mouth daily. 90 tablet 3   No current facility-administered medications on file prior to visit.     Allergies  Allergen Reactions  . Losartan Rash and Other (See Comments)    Abdominal and leg rashes noted in 12/17      Assessment/Plan:  Hypertension: BP is *** and is still above goal of <130/80 mmHg. He is doing well on carvedilol 12.5 mg BID. His heart rate is within normal limits on this dose. He will work to cut back on salt intake.  Hyperlipidemia: LDL 67 and reduced from baseline (LDL 101) and at goal of < 70 mg/dL. Given diagnosis of non-obstructive CAD with 25-40% stenosis in multiple vessels, should continue Crestor 10 mg daily.  Recommend 150 minutes/week of aerobic exercise Reduce salt intake in diet     **was on methylpred talbet pack/colchicine for some  time in august for footpain. *gout labs came back (-), bunion. But monocytes were elevated.   Diana L. Kyung Rudd, PharmD, [place] PGY1 Pharmacy Resident   [facility] [address], [place], [place] [address] Phone: [phone]; Fax: [phone]

## 2016-10-26 ENCOUNTER — Ambulatory Visit (INDEPENDENT_AMBULATORY_CARE_PROVIDER_SITE_OTHER): Payer: Medicare Other | Admitting: Sports Medicine

## 2016-10-26 DIAGNOSIS — M21619 Bunion of unspecified foot: Secondary | ICD-10-CM | POA: Diagnosis not present

## 2016-10-26 DIAGNOSIS — M79671 Pain in right foot: Secondary | ICD-10-CM

## 2016-10-26 DIAGNOSIS — M7751 Other enthesopathy of right foot: Secondary | ICD-10-CM

## 2016-10-26 DIAGNOSIS — M778 Other enthesopathies, not elsewhere classified: Secondary | ICD-10-CM

## 2016-10-26 DIAGNOSIS — M779 Enthesopathy, unspecified: Principal | ICD-10-CM

## 2016-10-26 NOTE — Progress Notes (Signed)
Subjective: Leonard [name] is a 66 y.o. male patient who returns to office for follow up of 1st big toe joint, pain on right. Patient reports that the steroid pack helped. Denies pain at big toe joint. Patient denies any other pedal complaints.   Patient Active Problem List   Diagnosis Date Noted  . Hypertensive heart disease 09/27/2016  . Tobacco abuse 09/27/2016  . Mixed hyperlipidemia 09/27/2016  . History of tobacco abuse 06/25/2016  . Allergic sinusitis 04/17/2016  . Asthma 01/19/2016  . CKD (chronic kidney disease) stage 2, GFR 60-89 ml/min 01/19/2016  . Aspiration pneumonitis ([place])   . Essential hypertension   . Heart failure with reduced ejection fraction ([place]) 01/10/2016  . Malignant neoplasm of prostate ([place]) 07/03/2013    Current Outpatient Prescriptions on File Prior to Visit  Medication Sig Dispense Refill  . albuterol (PROVENTIL HFA;VENTOLIN HFA) 108 (90 Base) MCG/ACT inhaler Inhale 2 puffs into the lungs every 6 (six) hours as needed for wheezing or shortness of breath.    Marland Kitchen albuterol (PROVENTIL) (2.5 MG/3ML) 0.083% nebulizer solution Take 3 mLs (2.5 mg total) by nebulization every 6 (six) hours as needed for wheezing or shortness of breath. 75 mL 2  . amLODipine (NORVASC) 5 MG tablet Take 1 tablet (5 mg total) by mouth daily. 90 tablet 3  . carvedilol (COREG) 25 MG tablet Take 1 tablet (25 mg total) by mouth 2 (two) times daily. 180 tablet 3  . COMBIGAN 0.2-0.5 % ophthalmic solution Place 1 drop into both eyes daily.  1  . isosorbide mononitrate (IMDUR) 30 MG 24 hr tablet Take 1 tablet (30 mg total) by mouth daily. 90 tablet 3  . latanoprost (XALATAN) 0.005 % ophthalmic solution Place 1 drop into both eyes at bedtime.     Marland Kitchen PROLENSA 0.07 % SOLN Place 1 drop into the right eye 2 (two) times daily.    . rosuvastatin (CRESTOR) 10 MG tablet Take 1 tablet (10 mg total) by mouth daily. 90 tablet 3   No current facility-administered medications on file prior to visit.      Allergies  Allergen Reactions  . Losartan Rash and Other (See Comments)    Abdominal and leg rashes noted in 12/17    Objective:  General: Alert and oriented x3 in no acute distress  Dermatology: Decreased focal Swelling, no warmth, no redness on the Right foot at 1st MTPJ, No open lesions bilateral lower extremities, no webspace macerations, no ecchymosis bilateral, all nails x 10 are well manicured.  Vascular: Dorsalis Pedis and Posterior Tibial pedal pulses 1/4, Capillary Fill Time 3 seconds,(+) pedal hair growth bilateral,Temperature gradient increased over the Right 1st MTPJ  Neurology: Gross sensation intact via light touch bilateral, (- )Tinels sign bilateral.   Musculoskeletal: There is no tenderness with palpation at 1st MTPJ on Right or Left foot at bunion,No pain with calf compression bilateral. All joint range of motion is within normal limits except at the right 1st MTPJ where there is limiation, Strength within normal limits in all groups bilateral.        Assessment and Plan: Problem List Items Addressed This Visit    None    Visit Diagnoses    Capsulitis of foot, right    -  Primary   Bunion       Foot pain, right          -Complete examination performed -Discussed treatement options for continued care for capsulitis at bunion -Advised patient to follow up with PCP about bloodwork   -  Advised patient to call if symptoms flare up  -Patient to return in as needed for further discussion about bunion if bothersome once he is seen by PCP for increased monocytes  Landis Martins, DPM

## 2016-11-02 ENCOUNTER — Telehealth: Payer: Self-pay | Admitting: Sports Medicine

## 2016-11-02 NOTE — Telephone Encounter (Signed)
Dr Cannon Kettle we are working on getting the patient scheduled, did you want to prescribe him something for pain?

## 2016-11-02 NOTE — Telephone Encounter (Signed)
We can offer him a medrol dose pack for the inflammation and Tramadol for the pain Dr. Chauncey Cruel

## 2016-11-02 NOTE — Telephone Encounter (Signed)
I'm a patient of Dr. Leeanne Rio and I know she is only in the office on Tuesday's. My foot has just flared up two days ago. Same problem, same thing, very swollen, very painful, excruciating. Is there any way she can call me at my number. Send me in a prescription for some pain pills or something. My phone number is [phone]. I need to see her as soon as possible. Have her call me please.

## 2016-11-03 ENCOUNTER — Other Ambulatory Visit: Payer: Self-pay

## 2016-11-03 MED ORDER — TRAMADOL HCL 50 MG PO TABS: 50.0000 mg | ORAL_TABLET | Freq: Three times a day (TID) | ORAL | 0 refills | Status: DC | PRN

## 2016-11-03 MED ORDER — METHYLPREDNISOLONE 4 MG PO TABS: 4.0000 mg | ORAL_TABLET | Freq: Every day | ORAL | 0 refills | Status: DC

## 2016-11-03 NOTE — Progress Notes (Signed)
Spoke with patient advising him of prescribed medications. He is to complete the medrol dose pack, and follow up with Dr Cannon Kettle if pain conitnues

## 2017-02-24 ENCOUNTER — Encounter (HOSPITAL_COMMUNITY): Payer: Self-pay | Admitting: Emergency Medicine

## 2017-02-24 ENCOUNTER — Emergency Department (HOSPITAL_COMMUNITY): Payer: Medicare Other

## 2017-02-24 ENCOUNTER — Other Ambulatory Visit: Payer: Self-pay

## 2017-02-24 ENCOUNTER — Inpatient Hospital Stay (HOSPITAL_COMMUNITY): Payer: Medicare Other

## 2017-02-24 ENCOUNTER — Inpatient Hospital Stay (HOSPITAL_COMMUNITY)
Admission: EM | Admit: 2017-02-24 | Discharge: 2017-02-26 | DRG: 291 | Disposition: A | Discharge status: Home / Self Care | Payer: Medicare Other | Attending: Internal Medicine | Admitting: Internal Medicine

## 2017-02-24 DIAGNOSIS — I130 Hypertensive heart and chronic kidney disease with heart failure and stage 1 through stage 4 chronic kidney disease, or unspecified chronic kidney disease: Principal | ICD-10-CM | POA: Diagnosis present

## 2017-02-24 DIAGNOSIS — I160 Hypertensive urgency: Secondary | ICD-10-CM | POA: Diagnosis present

## 2017-02-24 DIAGNOSIS — R739 Hyperglycemia, unspecified: Secondary | ICD-10-CM | POA: Diagnosis present

## 2017-02-24 DIAGNOSIS — I5020 Unspecified systolic (congestive) heart failure: Secondary | ICD-10-CM

## 2017-02-24 DIAGNOSIS — Z888 Allergy status to other drugs, medicaments and biological substances status: Secondary | ICD-10-CM

## 2017-02-24 DIAGNOSIS — I428 Other cardiomyopathies: Secondary | ICD-10-CM | POA: Diagnosis present

## 2017-02-24 DIAGNOSIS — E785 Hyperlipidemia, unspecified: Secondary | ICD-10-CM | POA: Diagnosis present

## 2017-02-24 DIAGNOSIS — T502X5A Adverse effect of carbonic-anhydrase inhibitors, benzothiadiazides and other diuretics, initial encounter: Secondary | ICD-10-CM | POA: Diagnosis present

## 2017-02-24 DIAGNOSIS — N183 Chronic kidney disease, stage 3 (moderate): Secondary | ICD-10-CM | POA: Diagnosis present

## 2017-02-24 DIAGNOSIS — D72829 Elevated white blood cell count, unspecified: Secondary | ICD-10-CM | POA: Diagnosis present

## 2017-02-24 DIAGNOSIS — F17210 Nicotine dependence, cigarettes, uncomplicated: Secondary | ICD-10-CM | POA: Diagnosis present

## 2017-02-24 DIAGNOSIS — E876 Hypokalemia: Secondary | ICD-10-CM | POA: Diagnosis present

## 2017-02-24 DIAGNOSIS — F1010 Alcohol abuse, uncomplicated: Secondary | ICD-10-CM | POA: Diagnosis present

## 2017-02-24 DIAGNOSIS — Z720 Tobacco use: Secondary | ICD-10-CM | POA: Diagnosis not present

## 2017-02-24 DIAGNOSIS — Z9114 Patient's other noncompliance with medication regimen: Secondary | ICD-10-CM | POA: Diagnosis not present

## 2017-02-24 DIAGNOSIS — I2510 Atherosclerotic heart disease of native coronary artery without angina pectoris: Secondary | ICD-10-CM | POA: Diagnosis present

## 2017-02-24 DIAGNOSIS — J449 Chronic obstructive pulmonary disease, unspecified: Secondary | ICD-10-CM | POA: Diagnosis present

## 2017-02-24 DIAGNOSIS — I5023 Acute on chronic systolic (congestive) heart failure: Secondary | ICD-10-CM | POA: Diagnosis not present

## 2017-02-24 DIAGNOSIS — I509 Heart failure, unspecified: Secondary | ICD-10-CM | POA: Diagnosis not present

## 2017-02-24 DIAGNOSIS — Z79899 Other long term (current) drug therapy: Secondary | ICD-10-CM | POA: Diagnosis not present

## 2017-02-24 DIAGNOSIS — N179 Acute kidney failure, unspecified: Secondary | ICD-10-CM | POA: Diagnosis present

## 2017-02-24 DIAGNOSIS — I5043 Acute on chronic combined systolic (congestive) and diastolic (congestive) heart failure: Secondary | ICD-10-CM | POA: Diagnosis not present

## 2017-02-24 DIAGNOSIS — I10 Essential (primary) hypertension: Secondary | ICD-10-CM

## 2017-02-24 DIAGNOSIS — J4530 Mild persistent asthma, uncomplicated: Secondary | ICD-10-CM | POA: Diagnosis not present

## 2017-02-24 DIAGNOSIS — I110 Hypertensive heart disease with heart failure: Secondary | ICD-10-CM | POA: Diagnosis not present

## 2017-02-24 DIAGNOSIS — J9601 Acute respiratory failure with hypoxia: Secondary | ICD-10-CM | POA: Diagnosis present

## 2017-02-24 DIAGNOSIS — I5021 Acute systolic (congestive) heart failure: Secondary | ICD-10-CM | POA: Diagnosis present

## 2017-02-24 DIAGNOSIS — J45909 Unspecified asthma, uncomplicated: Secondary | ICD-10-CM | POA: Diagnosis present

## 2017-02-24 DIAGNOSIS — J810 Acute pulmonary edema: Secondary | ICD-10-CM

## 2017-02-24 DIAGNOSIS — Z8546 Personal history of malignant neoplasm of prostate: Secondary | ICD-10-CM | POA: Diagnosis not present

## 2017-02-24 DIAGNOSIS — J811 Chronic pulmonary edema: Secondary | ICD-10-CM | POA: Diagnosis present

## 2017-02-24 DIAGNOSIS — N189 Chronic kidney disease, unspecified: Secondary | ICD-10-CM

## 2017-02-24 LAB — URINALYSIS, ROUTINE W REFLEX MICROSCOPIC
Bilirubin Urine: NEGATIVE
Glucose, UA: NEGATIVE mg/dL
Ketones, ur: NEGATIVE mg/dL
Leukocytes, UA: NEGATIVE
Nitrite: NEGATIVE
Protein, ur: NEGATIVE mg/dL
Specific Gravity, Urine: 1.005 (ref 1.005–1.030)
pH: 6 (ref 5.0–8.0)

## 2017-02-24 LAB — I-STAT CHEM 8, ED
BUN: 15 mg/dL (ref 6–20)
Calcium, Ion: 1.2 mmol/L (ref 1.15–1.40)
Chloride: 106 mmol/L (ref 101–111)
Creatinine, Ser: 1.6 mg/dL — ABNORMAL HIGH (ref 0.61–1.24)
Glucose, Bld: 262 mg/dL — ABNORMAL HIGH (ref 65–99)
HCT: 52 % (ref 39.0–52.0)
Hemoglobin: 17.7 g/dL — ABNORMAL HIGH (ref 13.0–17.0)
Potassium: 3.7 mmol/L (ref 3.5–5.1)
Sodium: 142 mmol/L (ref 135–145)
TCO2: 25 mmol/L (ref 22–32)

## 2017-02-24 LAB — CBC WITH DIFFERENTIAL/PLATELET
Basophils Absolute: 0 10*3/uL (ref 0.0–0.1)
Basophils Relative: 0 %
Eosinophils Absolute: 0.6 10*3/uL (ref 0.0–0.7)
Eosinophils Relative: 3 %
HCT: 46.9 % (ref 39.0–52.0)
Hemoglobin: 16.5 g/dL (ref 13.0–17.0)
Lymphocytes Relative: 34 %
Lymphs Abs: 6.7 10*3/uL — ABNORMAL HIGH (ref 0.7–4.0)
MCH: 34.1 pg — ABNORMAL HIGH (ref 26.0–34.0)
MCHC: 35.2 g/dL (ref 30.0–36.0)
MCV: 96.9 fL (ref 78.0–100.0)
Monocytes Absolute: 1 10*3/uL (ref 0.1–1.0)
Monocytes Relative: 5 %
Neutro Abs: 11.3 10*3/uL — ABNORMAL HIGH (ref 1.7–7.7)
Neutrophils Relative %: 58 %
Platelets: 225 10*3/uL (ref 150–400)
RBC: 4.84 MIL/uL (ref 4.22–5.81)
RDW: 13.4 % (ref 11.5–15.5)
WBC: 19.6 10*3/uL — ABNORMAL HIGH (ref 4.0–10.5)

## 2017-02-24 LAB — COMPREHENSIVE METABOLIC PANEL
ALT: 29 U/L (ref 17–63)
AST: 60 U/L — ABNORMAL HIGH (ref 15–41)
Albumin: 4 g/dL (ref 3.5–5.0)
Alkaline Phosphatase: 97 U/L (ref 38–126)
Anion gap: 11 (ref 5–15)
BUN: 16 mg/dL (ref 6–20)
CO2: 23 mmol/L (ref 22–32)
Calcium: 9.1 mg/dL (ref 8.9–10.3)
Chloride: 106 mmol/L (ref 101–111)
Creatinine, Ser: 1.61 mg/dL — ABNORMAL HIGH (ref 0.61–1.24)
GFR calc Af Amer: 49 mL/min — ABNORMAL LOW (ref >60)
GFR calc non Af Amer: 43 mL/min — ABNORMAL LOW (ref >60)
Glucose, Bld: 261 mg/dL — ABNORMAL HIGH (ref 65–99)
Potassium: 3.7 mmol/L (ref 3.5–5.1)
Sodium: 140 mmol/L (ref 135–145)
Total Bilirubin: 1.2 mg/dL (ref 0.3–1.2)
Total Protein: 8 g/dL (ref 6.5–8.1)

## 2017-02-24 LAB — TROPONIN I
Troponin I: 0.11 ng/mL (ref <0.03)
Troponin I: 0.18 ng/mL (ref <0.03)

## 2017-02-24 LAB — I-STAT TROPONIN, ED: Troponin i, poc: 0.02 ng/mL (ref 0.00–0.08)

## 2017-02-24 LAB — BLOOD GAS, ARTERIAL
Acid-base deficit: 1.5 mmol/L (ref 0.0–2.0)
Bicarbonate: 23.5 mmol/L (ref 20.0–28.0)
Delivery systems: POSITIVE
Drawn by: 225631
Expiratory PAP: 5
FIO2: 40
Inspiratory PAP: 13
O2 Saturation: 91.5 %
Patient temperature: 98.6
RATE: 24 resp/min
pCO2 arterial: 42.6 mmHg (ref 32.0–48.0)
pH, Arterial: 7.36 (ref 7.350–7.450)
pO2, Arterial: 68 mmHg — ABNORMAL LOW (ref 83.0–108.0)

## 2017-02-24 LAB — CBG MONITORING, ED
Glucose-Capillary: 165 mg/dL — ABNORMAL HIGH (ref 65–99)
Glucose-Capillary: 192 mg/dL — ABNORMAL HIGH (ref 65–99)
Glucose-Capillary: 221 mg/dL — ABNORMAL HIGH (ref 65–99)

## 2017-02-24 LAB — GLUCOSE, CAPILLARY: Glucose-Capillary: 145 mg/dL — ABNORMAL HIGH (ref 65–99)

## 2017-02-24 LAB — BRAIN NATRIURETIC PEPTIDE: B Natriuretic Peptide: 1167.4 pg/mL — ABNORMAL HIGH (ref 0.0–100.0)

## 2017-02-24 LAB — PATHOLOGIST SMEAR REVIEW

## 2017-02-24 LAB — ECHOCARDIOGRAM COMPLETE

## 2017-02-24 MED ORDER — SODIUM CHLORIDE 0.9% FLUSH
3.0000 mL | INTRAVENOUS | Status: DC | PRN
  Administered 2017-02-24: 3 mL via INTRAVENOUS

## 2017-02-24 MED ORDER — IPRATROPIUM-ALBUTEROL 0.5-2.5 (3) MG/3ML IN SOLN
3.0000 mL | Freq: Four times a day (QID) | RESPIRATORY_TRACT | Status: DC
  Administered 2017-02-24 – 2017-02-25 (×4): 3 mL via RESPIRATORY_TRACT
  Filled 2017-02-24 (×4): qty 3

## 2017-02-24 MED ORDER — SODIUM CHLORIDE 0.9 % IV SOLN: 250.0000 mL | INTRAVENOUS | Status: DC | PRN

## 2017-02-24 MED ORDER — FUROSEMIDE 10 MG/ML IJ SOLN
40.0000 mg | Freq: Two times a day (BID) | INTRAMUSCULAR | Status: DC
  Administered 2017-02-24: 40 mg via INTRAVENOUS
  Filled 2017-02-24: qty 4

## 2017-02-24 MED ORDER — SODIUM CHLORIDE 0.9% FLUSH
3.0000 mL | Freq: Two times a day (BID) | INTRAVENOUS | Status: DC
  Administered 2017-02-24 – 2017-02-26 (×4): 3 mL via INTRAVENOUS

## 2017-02-24 MED ORDER — NITROGLYCERIN IN D5W 200-5 MCG/ML-% IV SOLN
0.0000 ug/min | Freq: Once | INTRAVENOUS | Status: AC
  Administered 2017-02-24: 20 ug/min via INTRAVENOUS
  Administered 2017-02-24: 50000 ug via INTRAVENOUS

## 2017-02-24 MED ORDER — ACETAMINOPHEN 325 MG PO TABS
650.0000 mg | ORAL_TABLET | ORAL | Status: DC | PRN
Start: 2017-02-24 — End: 2017-02-26

## 2017-02-24 MED ORDER — NITROGLYCERIN IN D5W 200-5 MCG/ML-% IV SOLN
0.0000 ug/min | INTRAVENOUS | Status: DC
  Administered 2017-02-24: 20 ug/min via INTRAVENOUS

## 2017-02-24 MED ORDER — FUROSEMIDE 10 MG/ML IJ SOLN
40.0000 mg | Freq: Once | INTRAMUSCULAR | Status: AC
  Administered 2017-02-24: 40 mg via INTRAVENOUS
  Filled 2017-02-24: qty 4

## 2017-02-24 MED ORDER — IPRATROPIUM-ALBUTEROL 0.5-2.5 (3) MG/3ML IN SOLN
3.0000 mL | RESPIRATORY_TRACT | Status: DC | PRN
  Administered 2017-02-26: 3 mL via RESPIRATORY_TRACT
  Filled 2017-02-24: qty 3

## 2017-02-24 MED ORDER — ENOXAPARIN SODIUM 40 MG/0.4ML ~~LOC~~ SOLN
40.0000 mg | SUBCUTANEOUS | Status: DC
  Administered 2017-02-24 – 2017-02-26 (×3): 40 mg via SUBCUTANEOUS
  Filled 2017-02-24 (×3): qty 0.4

## 2017-02-24 MED ORDER — NITROGLYCERIN IN D5W 200-5 MCG/ML-% IV SOLN
INTRAVENOUS | Status: AC
  Administered 2017-02-24: 50000 ug via INTRAVENOUS
  Filled 2017-02-24: qty 250

## 2017-02-24 MED ORDER — ONDANSETRON HCL 4 MG/2ML IJ SOLN: 4.0000 mg | Freq: Four times a day (QID) | INTRAMUSCULAR | Status: DC | PRN

## 2017-02-24 MED ORDER — INSULIN ASPART 100 UNIT/ML ~~LOC~~ SOLN
0.0000 [IU] | SUBCUTANEOUS | Status: DC
  Administered 2017-02-24 (×2): 2 [IU] via SUBCUTANEOUS
  Administered 2017-02-24: 3 [IU] via SUBCUTANEOUS
  Administered 2017-02-25 (×2): 1 [IU] via SUBCUTANEOUS
  Administered 2017-02-25: 2 [IU] via SUBCUTANEOUS
  Filled 2017-02-24 (×3): qty 1

## 2017-02-24 NOTE — ED Provider Notes (Signed)
[facility] DEPT Provider Note   CSN: [id number] Arrival date & time: 02/24/17  0306     History   Chief Complaint Chief Complaint  Patient presents with  . Shortness of Breath    HPI Leonard [name] is a 67 y.o. male.  The history is provided by the EMS personnel. The history is limited by the condition of the patient.  Shortness of Breath  This is a recurrent problem. The problem occurs continuously.The current episode started 6 to 12 hours ago. The problem has not changed since onset.Associated symptoms include wheezing. Pertinent negatives include no fever and no leg pain. It is unknown what precipitated the problem. He has tried beta-agonist inhalers for the symptoms. The treatment provided no relief. He has had prior hospitalizations. Associated medical issues include COPD. Associated medical issues comments: chf.    Past Medical History:  Diagnosis Date  . Asthma   . Hypertension   . Prostate cancer [facility])     Patient Active Problem List   Diagnosis Date Noted  . Hypertensive heart disease 09/27/2016  . Tobacco abuse 09/27/2016  . Mixed hyperlipidemia 09/27/2016  . History of tobacco abuse 06/25/2016  . Allergic sinusitis 04/17/2016  . Asthma 01/19/2016  . CKD (chronic kidney disease) stage 2, GFR 60-89 ml/min 01/19/2016  . Aspiration pneumonitis ([place])   . Essential hypertension   . Heart failure with reduced ejection fraction ([place]) 01/10/2016  . Malignant neoplasm of prostate ([place]) 07/03/2013    Past Surgical History:  Procedure Laterality Date  . APPENDECTOMY    . HYDROCELE EXCISION Left 12/01/2015   Procedure: REPAIR OF LEFT HYDROCELE;  Surgeon: Raynelle Bring, MD;  Location: [facility] OR[facility];  Service: Urology;  Laterality: Left;  . LYMPHADENECTOMY Bilateral 09/24/2013   Procedure: LYMPHADENECTOMY;  Surgeon: Raynelle Bring, MD;  Location: [facility];  Service: Urology;  Laterality: Bilateral;  . PROSTATE BIOPSY    . RIGHT/LEFT HEART CATH AND  CORONARY ANGIOGRAPHY N/A 06/10/2016   Procedure: Right/Left Heart Cath and Coronary Angiography;  Surgeon: Jettie Booze, MD;  Location: [facility] CV LAB;  Service: Cardiovascular;  Laterality: N/A;  . ROBOT ASSISTED LAPAROSCOPIC RADICAL PROSTATECTOMY N/A 09/24/2013   Procedure: ROBOTIC ASSISTED LAPAROSCOPIC RADICAL PROSTATECTOMY LEVEL 2;  Surgeon: Raynelle Bring, MD;  Location: [facility];  Service: Urology;  Laterality: N/A;       Home Medications    Prior to Admission medications   Medication Sig Start Date End Date Taking? Authorizing Provider  albuterol (PROVENTIL HFA;VENTOLIN HFA) 108 (90 Base) MCG/ACT inhaler Inhale 2 puffs into the lungs every 6 (six) hours as needed for wheezing or shortness of breath.   Yes [provider]  albuterol (PROVENTIL) (2.5 MG/3ML) 0.083% nebulizer solution Take 3 mLs (2.5 mg total) by nebulization every 6 (six) hours as needed for wheezing or shortness of breath. 01/19/16  Yes Milagros Loll, MD  amLODipine (NORVASC) 5 MG tablet Take 1 tablet (5 mg total) by mouth daily. 09/27/16 02/24/17 Yes Jettie Booze, MD  carvedilol (COREG) 12.5 MG tablet Take 12.5 mg by mouth 2 (two) times daily. 01/15/17  Yes [provider]  COMBIGAN 0.2-0.5 % ophthalmic solution Place 1 drop into both eyes daily. 07/15/16  Yes [provider]  isosorbide mononitrate (IMDUR) 30 MG 24 hr tablet Take 1 tablet (30 mg total) by mouth daily. 08/31/16  Yes Jettie Booze, MD  latanoprost (XALATAN) 0.005 % ophthalmic solution Place 1 drop into both eyes at bedtime.  03/04/16  Yes [provider]  PROLENSA 0.07 % SOLN Place 1 drop into the right eye 2 (two) times daily. 05/13/16  Yes [provider]  rosuvastatin (CRESTOR) 10 MG tablet Take 1 tablet (10 mg total) by mouth daily. 08/31/16 02/24/17 Yes Jettie Booze, MD  carvedilol (COREG) 25 MG tablet Take 1 tablet (25 mg total) by mouth 2 (two) times daily. Patient not taking:  Reported on 02/24/2017 09/27/16 12/26/16  Jettie Booze, MD  methylPREDNISolone (MEDROL) 4 MG tablet Take 1 tablet (4 mg total) by mouth daily. Patient not taking: Reported on 02/24/2017 11/03/16   Landis Martins, DPM  traMADol (ULTRAM) 50 MG tablet Take 1 tablet (50 mg total) by mouth every 8 (eight) hours as needed. Patient not taking: Reported on 02/24/2017 11/03/16   Landis Martins, DPM    Family History Family History  Problem Relation Age of Onset  . Hypertension Sister   . Cancer Neg Hx     Social History Social History   Tobacco Use  . Smoking status: Current Every Day Smoker    Packs/day: 2.00    Years: 30.00    Pack years: 60.00    Types: Cigarettes    Last attempt to quit: 12/24/2015    Years since quitting: 1.1  . Smokeless tobacco: Never Used  Substance Use Topics  . Alcohol use: Yes    Comment: 5 glasses of alcohol per day  . Drug use: No     Allergies   Losartan   Review of Systems Review of Systems  Unable to perform ROS: Acuity of condition  Constitutional: Positive for diaphoresis. Negative for fever.  Respiratory: Positive for shortness of breath and wheezing.      Physical Exam Updated Vital Signs BP 134/88   Pulse (!) 109   Temp 99.1 F (37.3 C)   Resp (!) 23   SpO2 95%   Physical Exam  Constitutional: He appears distressed.  HENT:  Head: Normocephalic and atraumatic.  Nose: Nose normal.  Eyes: Conjunctivae are normal. Pupils are equal, round, and reactive to light.  Neck: Normal range of motion. Neck supple.  Cardiovascular: Regular rhythm. Tachycardia present.  Pulmonary/Chest: Tachypnea noted. He is in respiratory distress. He has rales.  Abdominal: Soft. Bowel sounds are normal. There is no guarding.  Musculoskeletal:       Right lower leg: Normal.       Left lower leg: Normal.  Lymphadenopathy:    He has no cervical adenopathy.  Neurological: He is alert.  Skin: Capillary refill takes less than 2 seconds. He is  diaphoretic.  Psychiatric: He has a normal mood and affect. His behavior is normal.     ED Treatments / Results  Labs (all labs ordered are listed, but only abnormal results are displayed) Results for orders placed or performed during the hospital encounter of 02/24/17  CBC with Differential/Platelet  Result Value Ref Range   WBC 19.6 (H) 4.0 - 10.5 K/uL   RBC 4.84 4.22 - 5.81 MIL/uL   Hemoglobin 16.5 13.0 - 17.0 g/dL   HCT 46.9 39.0 - 52.0 %   MCV 96.9 78.0 - 100.0 fL   MCH 34.1 (H) 26.0 - 34.0 pg   MCHC 35.2 30.0 - 36.0 g/dL   RDW 13.4 11.5 - 15.5 %   Platelets 225 150 - 400 K/uL   Neutrophils Relative % 58 %   Lymphocytes Relative 34 %   Monocytes Relative 5 %   Eosinophils Relative 3 %   Basophils Relative 0 %  Neutro Abs 11.3 (H) 1.7 - 7.7 K/uL   Lymphs Abs 6.7 (H) 0.7 - 4.0 K/uL   Monocytes Absolute 1.0 0.1 - 1.0 K/uL   Eosinophils Absolute 0.6 0.0 - 0.7 K/uL   Basophils Absolute 0.0 0.0 - 0.1 K/uL   WBC Morphology ABSOLUTE LYMPHOCYTOSIS   Brain natriuretic peptide  Result Value Ref Range   B Natriuretic Peptide 1,167.4 (H) 0.0 - 100.0 pg/mL  Comprehensive metabolic panel  Result Value Ref Range   Sodium 140 135 - 145 mmol/L   Potassium 3.7 3.5 - 5.1 mmol/L   Chloride 106 101 - 111 mmol/L   CO2 23 22 - 32 mmol/L   Glucose, Bld 261 (H) 65 - 99 mg/dL   BUN 16 6 - 20 mg/dL   Creatinine, Ser 1.61 (H) 0.61 - 1.24 mg/dL   Calcium 9.1 8.9 - 10.3 mg/dL   Total Protein 8.0 6.5 - 8.1 g/dL   Albumin 4.0 3.5 - 5.0 g/dL   AST 60 (H) 15 - 41 U/L   ALT 29 17 - 63 U/L   Alkaline Phosphatase 97 38 - 126 U/L   Total Bilirubin 1.2 0.3 - 1.2 mg/dL   GFR calc non Af Amer 43 (L) >60 mL/min   GFR calc Af Amer 49 (L) >60 mL/min   Anion gap 11 5 - 15  Blood gas, arterial ([facility] ONLY)  Result Value Ref Range   FIO2 40.00    Delivery systems BILEVEL POSITIVE AIRWAY PRESSURE    LHR 24 resp/min   Inspiratory PAP 13    Expiratory PAP 5    pH, Arterial 7.360 7.350 - 7.450    pCO2 arterial 42.6 32.0 - 48.0 mmHg   pO2, Arterial 68.0 (L) 83.0 - 108.0 mmHg   Bicarbonate 23.5 20.0 - 28.0 mmol/L   Acid-base deficit 1.5 0.0 - 2.0 mmol/L   O2 Saturation 91.5 %   Patient temperature 98.6    Collection site LEFT RADIAL    Drawn by [id number]    Sample type ARTERIAL    Allens test (pass/fail) PASS PASS  I-Stat Chem 8, ED  Result Value Ref Range   Sodium 142 135 - 145 mmol/L   Potassium 3.7 3.5 - 5.1 mmol/L   Chloride 106 101 - 111 mmol/L   BUN 15 6 - 20 mg/dL   Creatinine, Ser 1.60 (H) 0.61 - 1.24 mg/dL   Glucose, Bld 262 (H) 65 - 99 mg/dL   Calcium, Ion 1.20 1.15 - 1.40 mmol/L   TCO2 25 22 - 32 mmol/L   Hemoglobin 17.7 (H) 13.0 - 17.0 g/dL   HCT 52.0 39.0 - 52.0 %  I-stat troponin, ED  Result Value Ref Range   Troponin i, poc 0.02 0.00 - 0.08 ng/mL   Comment 3           Dg Chest Portable 1 View  Result Date: 02/24/2017 CLINICAL DATA:  67 y/o M; shortness of breath and congestive heart failure. EXAM: PORTABLE CHEST 1 VIEW COMPARISON:  01/19/2016 chest radiograph FINDINGS: Mildly enlarged cardiac silhouette given projection and technique. Aortic atherosclerosis with calcification. Reticular and patchy opacities of the lungs. No pleural effusion or pneumothorax. No acute osseous abnormality is evident. IMPRESSION: Reticular and patchy opacities of lungs likely representing interstitial and alveolar pulmonary edema. Enlarged cardiac silhouette. Electronically Signed   By: Kristine Garbe M.D.   On: 02/24/2017 03:35    EKG  EKG Interpretation  Date/Time:  Thursday February 24 2017 03:44:16 EST Ventricular Rate:  125  PR Interval:    QRS Duration: 80 QT Interval:  335 QTC Calculation: 484 R Axis:   24 Text Interpretation:  Sinus tachycardia LAE, consider biatrial enlargement Probable anteroseptal infarct, old Nonspecific repol abnormality, diffuse leads Confirmed by Randal Buba, Daritza Brees ([id number]) on 02/24/2017 5:09:52 AM       Radiology Dg Chest Portable 1  View  Result Date: 02/24/2017 CLINICAL DATA:  67 y/o M; shortness of breath and congestive heart failure. EXAM: PORTABLE CHEST 1 VIEW COMPARISON:  01/19/2016 chest radiograph FINDINGS: Mildly enlarged cardiac silhouette given projection and technique. Aortic atherosclerosis with calcification. Reticular and patchy opacities of the lungs. No pleural effusion or pneumothorax. No acute osseous abnormality is evident. IMPRESSION: Reticular and patchy opacities of lungs likely representing interstitial and alveolar pulmonary edema. Enlarged cardiac silhouette. Electronically Signed   By: Kristine Garbe M.D.   On: 02/24/2017 03:35    Procedures Procedures (including critical care time)  Medications Ordered in ED Medications  nitroGLYCERIN 50 mg in dextrose 5 % 250 mL (0.2 mg/mL) infusion (not administered)  furosemide (LASIX) injection 40 mg (40 mg Intravenous Given 02/24/17 0316)  nitroGLYCERIN 50 mg in dextrose 5 % 250 mL (0.2 mg/mL) infusion (20 mcg/min Intravenous New Bag/Given 02/24/17 0319)  nitroGLYCERIN 0.2 mg/mL in dextrose 5 % infusion (30 mcg/min  Rate/Dose Change 02/24/17 0332)   BIPAP started by me immediately  MDM Reviewed: previous chart, nursing note and vitals Reviewed previous: ECG and labs Interpretation: labs, ECG and x-ray (normal troponin slightly increased creatine, mild acidosis on ABG.  Pulmonary edemaby me on CXR) Total time providing critical care: > 105 minutes (bipap, lasix and NTG drip started by me in the ED). This excludes time spent performing separately reportable procedures and services. Consults: admitting MD  CRITICAL CARE Performed by: Carlisle Beers Total critical care time: 120  minutes Critical care time was exclusive of separately billable procedures and treating other patients. Critical care was necessary to treat or prevent imminent or life-threatening deterioration. Critical care was time spent personally by me on the following  activities: development of treatment plan with patient and/or surrogate as well as nursing, discussions with consultants, evaluation of patient's response to treatment, examination of patient, obtaining history from patient or surrogate, ordering and performing treatments and interventions, ordering and review of laboratory studies, ordering and review of radiographic studies, pulse oximetry and re-evaluation of patient's condition.     Final Clinical Impressions(s) / ED Diagnoses   Final diagnoses:  Systolic congestive heart failure, unspecified HF chronicity ([place])    Admit to step down    Cabella Kimm, MD 02/24/17 [id number]

## 2017-02-24 NOTE — ED Triage Notes (Signed)
Patient BIB [facility] from home for SOB going on all day. Pt has been increasing neb treatments with no relief and finally called EMS.  Initial o2 sat 67% on RA when [employer]. Arrived. Given 10mg  albuterol, 0.5 mg Atrovent, 125 mg soulmedrol, 2g mag. Hx asthma and HTN.

## 2017-02-24 NOTE — ED Notes (Signed)
Patient is comfortable on bipap with RR24-no further diaphoresis/MP ST

## 2017-02-24 NOTE — H&P (Signed)
History and Physical    Hillel Card VQQ:[id number] DOB: October 16, 1950 DOA: 02/24/2017  Referring MD/NP/PA: Dr. April Palumbo PCP: Maryellen Pile, MD  Patient coming from: via EMS  Chief Complaint: Shortness of breath  I have personally briefly reviewed patient's old medical records in [place]   HPI: Leonard [name] is a 67 y.o. male with medical history significant of systolic CHF 43-32% following left heart cath, CAD, HTN, and asthma; who presents with complaints of acute worsening of shortness of breath overnight.  Patient reports having some subjective fever and chills on New Year's Eve, but since that time the symptoms have resolved.  Complaints of progressively worsening shortness of breath over the 1-2 weeks especially with exertion.  Patient admits to not taking his prescribed medications as advised.  Associated symptoms include a mostly nonproductive cough.  Denies any leg swelling, change in weight, chest pain, diarrhea, nausea, or vomiting.  Prior to arrival patient did not try anything to relieve symptoms.  EMS was called and noted initial O2 saturations of 67% on room air.  He was given 10 mg of albuterol 5 mg of Atrovent, 125 mg of Solu-Medrol, and 2 g of magnesium sulfate.   ED Course: Upon admission into the emergency department patient was noted to have a temperature of 90.1 F, pulse 109-137, respirations 22-38, blood pressure elevated to 237/136, and O2 saturations as low as 89%.  Initial ABG on BiPAP revealed pH of 7.36, P CO2 42.6, PO2 68.  Labs revealed WBC 19.6, BNP 1167.4, and troponin 0.02.  Chest x-ray showed cardiomegaly with signs of pulmonary edema.  Patient was given 40 mg of Lasix, placed on a nitroglycerin drip for elevated blood pressures, and [facility] called to admit.  Review of Systems  Constitutional: Positive for malaise/fatigue. Negative for chills and fever.  Respiratory: Positive for shortness of breath.   Cardiovascular: Negative for chest pain and leg  swelling.  Neurological: Negative for focal weakness and seizures.    Past Medical History:  Diagnosis Date  . Asthma   . Hypertension   . Prostate cancer [facility])     Past Surgical History:  Procedure Laterality Date  . APPENDECTOMY    . HYDROCELE EXCISION Left 12/01/2015   Procedure: REPAIR OF LEFT HYDROCELE;  Surgeon: Raynelle Bring, MD;  Location: [facility];  Service: Urology;  Laterality: Left;  . LYMPHADENECTOMY Bilateral 09/24/2013   Procedure: LYMPHADENECTOMY;  Surgeon: Raynelle Bring, MD;  Location: [facility];  Service: Urology;  Laterality: Bilateral;  . PROSTATE BIOPSY    . RIGHT/LEFT HEART CATH AND CORONARY ANGIOGRAPHY N/A 06/10/2016   Procedure: Right/Left Heart Cath and Coronary Angiography;  Surgeon: Jettie Booze, MD;  Location: [place] CV LAB;  Service: Cardiovascular;  Laterality: N/A;  . ROBOT ASSISTED LAPAROSCOPIC RADICAL PROSTATECTOMY N/A 09/24/2013   Procedure: ROBOTIC ASSISTED LAPAROSCOPIC RADICAL PROSTATECTOMY LEVEL 2;  Surgeon: Raynelle Bring, MD;  Location: [facility];  Service: Urology;  Laterality: N/A;     reports that he has been smoking cigarettes.  He has a 60.00 pack-year smoking history. he has never used smokeless tobacco. He reports that he drinks alcohol. He reports that he does not use drugs.  Allergies  Allergen Reactions  . Losartan Rash and Other (See Comments)    Abdominal and leg rashes noted in 12/17    Family History  Problem Relation Age of Onset  . Hypertension Sister   . Cancer Neg Hx     Prior to Admission medications   Medication Sig Start Date  End Date Taking? Authorizing Provider  albuterol (PROVENTIL HFA;VENTOLIN HFA) 108 (90 Base) MCG/ACT inhaler Inhale 2 puffs into the lungs every 6 (six) hours as needed for wheezing or shortness of breath.   Yes [provider]  albuterol (PROVENTIL) (2.5 MG/3ML) 0.083% nebulizer solution Take 3 mLs (2.5 mg total) by nebulization every 6 (six) hours as needed for wheezing or shortness of  breath. 01/19/16  Yes Milagros Loll, MD  amLODipine (NORVASC) 5 MG tablet Take 1 tablet (5 mg total) by mouth daily. 09/27/16 02/24/17 Yes Jettie Booze, MD  carvedilol (COREG) 12.5 MG tablet Take 12.5 mg by mouth 2 (two) times daily. 01/15/17  Yes [provider]  COMBIGAN 0.2-0.5 % ophthalmic solution Place 1 drop into both eyes daily. 07/15/16  Yes [provider]  isosorbide mononitrate (IMDUR) 30 MG 24 hr tablet Take 1 tablet (30 mg total) by mouth daily. 08/31/16  Yes Jettie Booze, MD  latanoprost (XALATAN) 0.005 % ophthalmic solution Place 1 drop into both eyes at bedtime.  03/04/16  Yes [provider]  PROLENSA 0.07 % SOLN Place 1 drop into the right eye 2 (two) times daily. 05/13/16  Yes [provider]  rosuvastatin (CRESTOR) 10 MG tablet Take 1 tablet (10 mg total) by mouth daily. 08/31/16 02/24/17 Yes Jettie Booze, MD  carvedilol (COREG) 25 MG tablet Take 1 tablet (25 mg total) by mouth 2 (two) times daily. Patient not taking: Reported on 02/24/2017 09/27/16 12/26/16  Jettie Booze, MD  methylPREDNISolone (MEDROL) 4 MG tablet Take 1 tablet (4 mg total) by mouth daily. Patient not taking: Reported on 02/24/2017 11/03/16   Landis Martins, DPM  traMADol (ULTRAM) 50 MG tablet Take 1 tablet (50 mg total) by mouth every 8 (eight) hours as needed. Patient not taking: Reported on 02/24/2017 11/03/16   Landis Martins, DPM    Physical Exam:  Constitutional: Elderly male in no significant distress at this time on BiPAP Vitals:   02/24/17 0345 02/24/17 0400 02/24/17 0415 02/24/17 0433  BP: (!) 148/95 134/87 (!) 142/107 131/88  Pulse: (!) 123 (!) 117 (!) 116 (!) 116  Resp: (!) 33 (!) 25 (!) 24 (!) 23  Temp:  99 F (37.2 C) 99 F (37.2 C)   SpO2: 94% 92% 92% 93%   Eyes: PERRL, lids and conjunctivae normal ENMT: Mucous membranes are moist. Posterior pharynx clear of any exudate or lesions. Neck: normal, supple, no masses, no  thyromegaly Respiratory: Mildly tachypneic on BiPAP with positive crackles appreciated throughout both lung fields.  No significant wheezes appreciated at this time. Cardiovascular: Tachycardic, no murmurs / rubs / gallops. No extremity edema. 2+ pedal pulses. No carotid bruits.  Abdomen: no tenderness, no masses palpated. No hepatosplenomegaly. Bowel sounds positive.  Musculoskeletal: no clubbing / cyanosis. No joint deformity upper and lower extremities. Good ROM, no contractures. Normal muscle tone.  Skin: no rashes, lesions, ulcers. No induration Neurologic: CN 2-12 grossly intact. Sensation intact, DTR normal. Strength 5/5 in all 4.  Psychiatric: Alert and oriented x3.    Labs on Admission: I have personally reviewed following labs and imaging studies  CBC: Recent Labs  Lab 02/24/17 0319 02/24/17 0339  WBC 19.6*  --   NEUTROABS 11.3*  --   HGB 16.5 17.7*  HCT 46.9 52.0  MCV 96.9  --   PLT 225  --    Basic Metabolic Panel: Recent Labs  Lab 02/24/17 0319 02/24/17 0339  NA 140 142  K 3.7 3.7  CL  106 106  CO2 23  --   GLUCOSE 261* 262*  BUN 16 15  CREATININE 1.61* 1.60*  CALCIUM 9.1  --    GFR: CrCl cannot be calculated (Unknown ideal weight.). Liver Function Tests: Recent Labs  Lab 02/24/17 0319  AST 60*  ALT 29  ALKPHOS 97  BILITOT 1.2  PROT 8.0  ALBUMIN 4.0   No results for input(s): LIPASE, AMYLASE in the last 168 hours. No results for input(s): AMMONIA in the last 168 hours. Coagulation Profile: No results for input(s): INR, PROTIME in the last 168 hours. Cardiac Enzymes: No results for input(s): CKTOTAL, CKMB, CKMBINDEX, TROPONINI in the last 168 hours. BNP (last 3 results) No results for input(s): PROBNP in the last 8760 hours. HbA1C: No results for input(s): HGBA1C in the last 72 hours. CBG: No results for input(s): GLUCAP in the last 168 hours. Lipid Profile: No results for input(s): CHOL, HDL, LDLCALC, TRIG, CHOLHDL, LDLDIRECT in the last 72  hours. Thyroid Function Tests: No results for input(s): TSH, T4TOTAL, FREET4, T3FREE, THYROIDAB in the last 72 hours. Anemia Panel: No results for input(s): VITAMINB12, FOLATE, FERRITIN, TIBC, IRON, RETICCTPCT in the last 72 hours. Urine analysis:    Component Value Date/Time   COLORURINE YELLOW 11/28/2006 [place] 11/28/2006 1206   LABSPEC 1.014 11/28/2006 1206   PHURINE 5.0 11/28/2006 1206   GLUCOSEU NEGATIVE 11/28/2006 1206   HGBUR LARGE (A) 11/28/2006 1206   BILIRUBINUR NEGATIVE 11/28/2006 [place] 11/28/2006 1206   PROTEINUR NEGATIVE 11/28/2006 1206   UROBILINOGEN 1.0 11/28/2006 1206   NITRITE NEGATIVE 11/28/2006 1206   LEUKOCYTESUR NEGATIVE 11/28/2006 1206   Sepsis Labs: No results found for this or any previous visit (from the past 240 hour(s)).   Radiological Exams on Admission: Dg Chest Portable 1 View  Result Date: 02/24/2017 CLINICAL DATA:  67 y/o M; shortness of breath and congestive heart failure. EXAM: PORTABLE CHEST 1 VIEW COMPARISON:  01/19/2016 chest radiograph FINDINGS: Mildly enlarged cardiac silhouette given projection and technique. Aortic atherosclerosis with calcification. Reticular and patchy opacities of the lungs. No pleural effusion or pneumothorax. No acute osseous abnormality is evident. IMPRESSION: Reticular and patchy opacities of lungs likely representing interstitial and alveolar pulmonary edema. Enlarged cardiac silhouette. Electronically Signed   By: Kristine Garbe M.D.   On: 02/24/2017 03:35    EKG: Independently reviewed.  Sinus tachycardia at 117 bpm  Assessment/Plan  Respiratory failure with hypoxia, Asthma exacerbation  2/2 pulmonary edema with systolic CHF exacerbation: Acute.  Patient presents with acute shortness of breath found to have significant signs of pulmonary edema.  BNP elevated at 1107.4.  Patient is given 40 mg of Lasix IV and placed on BiPAP for respiratory distress.  Patient's last EF  35-45% by estimate on right heart cath from 06/2016.  Patient followed by Dr. [name]. -- Admit to a telemetry bed - Heart failure orders set  initiated  - Continue BiPAP for now - Continuous pulse oximetry and wean to nasal cannula oxygen as needed to keep O2 saturations >92% - Strict I&Os and daily weights - Elevate lower extremities - Lasix 40 mg IV Bid - Reassess in a.m. and adjust diuresis as needed. - Trend troponins - Check echocardiogram - DuoNeb's 4 times daily - Optimize medical management as able - Message sent for cardiology to see in a.m.   Hypertensive urgency: Patient's initial blood pressures noted to be elevated up as high as 237/136.  Patient placed on nitroglycerin drip with  improvement blood pressures. - Wean nitroglycerin drip to off as able  Leukocytosis: WBC elevated at 19.6.  SIRS criteria met, but patient symptoms appear more consistent with fluid overload. - Recheck CBC tomorrow a.m.  Acute kidney injury on chronic kidney disease stage III: Patient's baseline creatinine appears to be around 1.2-1.3.  He presents with a creatinine of 1.6 and BUN 15.  Suspect hypoperfusion related with CHF exacerbation. - Continue to monitor creatinine - Check urinalysis   Hyperglycemia: Acute.  Patient's blood glucose noted to be rated as 261 on admission.  Suspect secondary to acute stress response in addition to patient receiving 125 mg of Solu-Medrol. - Sensitive sliding scale insulin every 4 hours,  - Adjust/discontinue when medically appropriate DVT prophylaxis: lovenox Code Status: Full  Family Communication:   Disposition Plan: TBD  Consults called: None  Admission status: Inpatient   Norval Morton MD [facility] Pager [phone]   If 7PM-7AM, please contact night-coverage [email or web address] Password [facility]  02/24/2017, 5:17 AM

## 2017-02-24 NOTE — Consult Note (Signed)
Cardiology Consultation:   Patient ID: [name]; [id number]; [birth year]   Admit date: 02/24/2017 Date of Consult: 02/24/2017  Primary Care Provider: Maryellen Pile, MD Primary Cardiologist: [name], MD   Patient Profile:   Leonard [name] is a 67 y.o. male with a hx of SYstolic CHF EF 62-22%, CAD, HTN, and asthma who is being seen today for the evaluation of CHF at the request of Dr. Darrick Meigs.  History of Present Illness:   Leonard [name] has a history of non-ischemic cardiomyopathy and chronic systolic heart failure likely related to elevated blood pressure. His EF has been in the 35-45% range by echo and cath done in 06/2016. His cath showed non-obstructive CAD.   Leonard [name] admits that he has not taken any of his medications in at least a month, probably longer. He began to feel bad on New Year's eve with shortness of breath and chills. He stayed out of work and mostly stayed home until he felt better about 5-6 days ago. 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 Tuesday night he noted shortness of breath. He did a neb treatment before going to bed. He awoke after a couple of hours with difficulty breathing and he was unable to sleep the rest of the night. Last night he had dyspnea, orthopnea and PND. He denies chest pain, palpitations or edema.  Leonard [name] admits to continue smoking about 1 PPD and drinking at least a pint of whiskey per day.   Past Medical History:  Diagnosis Date  . Asthma   . Hypertension   . Prostate cancer [facility])     Past Surgical History:  Procedure Laterality Date  . APPENDECTOMY    . HYDROCELE EXCISION Left 12/01/2015   Procedure: REPAIR OF LEFT HYDROCELE;  Surgeon: Raynelle Bring, MD;  Location: [facility];  Service: Urology;  Laterality: Left;  . LYMPHADENECTOMY Bilateral 09/24/2013   Procedure: LYMPHADENECTOMY;  Surgeon: Raynelle Bring, MD;  Location: [facility];  Service: Urology;  Laterality: Bilateral;  . PROSTATE BIOPSY      . RIGHT/LEFT HEART CATH AND CORONARY ANGIOGRAPHY N/A 06/10/2016   Procedure: Right/Left Heart Cath and Coronary Angiography;  Surgeon: Jettie Booze, MD;  Location: [facility];  Service: Cardiovascular;  Laterality: N/A;  . ROBOT ASSISTED LAPAROSCOPIC RADICAL PROSTATECTOMY N/A 09/24/2013   Procedure: ROBOTIC ASSISTED LAPAROSCOPIC RADICAL PROSTATECTOMY LEVEL 2;  Surgeon: Raynelle Bring, MD;  Location: [facility];  Service: Urology;  Laterality: N/A;     Home Medications:   Pt admits that has not been taking his meds except for inhalers Prior to Admission medications   Medication Sig Start Date End Date Taking? Authorizing Provider  albuterol (PROVENTIL HFA;VENTOLIN HFA) 108 (90 Base) MCG/ACT inhaler Inhale 2 puffs into the lungs every 6 (six) hours as needed for wheezing or shortness of breath.   Yes [provider]  albuterol (PROVENTIL) (2.5 MG/3ML) 0.083% nebulizer solution Take 3 mLs (2.5 mg total) by nebulization every 6 (six) hours as needed for wheezing or shortness of breath. 01/19/16  Yes Milagros Loll, MD  amLODipine (NORVASC) 5 MG tablet Take 1 tablet (5 mg total) by mouth daily. 09/27/16 02/24/17 Yes Jettie Booze, MD  carvedilol (COREG) 12.5 MG tablet Take 12.5 mg by mouth 2 (two) times daily. 01/15/17  Yes [provider]  COMBIGAN 0.2-0.5 % ophthalmic solution Place 1 drop into both eyes daily. 07/15/16  Yes [provider]  isosorbide mononitrate (IMDUR) 30 MG 24 hr tablet  Take 1 tablet (30 mg total) by mouth daily. 08/31/16  Yes Jettie Booze, MD  latanoprost (XALATAN) 0.005 % ophthalmic solution Place 1 drop into both eyes at bedtime.  03/04/16  Yes [provider]  PROLENSA 0.07 % SOLN Place 1 drop into the right eye 2 (two) times daily. 05/13/16  Yes [provider]  rosuvastatin (CRESTOR) 10 MG tablet Take 1 tablet (10 mg total) by mouth daily. 08/31/16 02/24/17 Yes Jettie Booze, MD  carvedilol (COREG) 25 MG  tablet Take 1 tablet (25 mg total) by mouth 2 (two) times daily. Patient not taking: Reported on 02/24/2017 09/27/16 12/26/16  Jettie Booze, MD  methylPREDNISolone (MEDROL) 4 MG tablet Take 1 tablet (4 mg total) by mouth daily. Patient not taking: Reported on 02/24/2017 11/03/16   Landis Martins, DPM  traMADol (ULTRAM) 50 MG tablet Take 1 tablet (50 mg total) by mouth every 8 (eight) hours as needed. Patient not taking: Reported on 02/24/2017 11/03/16   Landis Martins, DPM    Inpatient Medications: Scheduled Meds: . enoxaparin (LOVENOX) injection  40 mg Subcutaneous Q24H  . furosemide  40 mg Intravenous BID  . insulin aspart  0-9 Units Subcutaneous Q4H  . ipratropium-albuterol  3 mL Nebulization QID  . sodium chloride flush  3 mL Intravenous Q12H   Continuous Infusions: . sodium chloride    . nitroGLYCERIN     PRN Meds: sodium chloride, acetaminophen, ipratropium-albuterol, ondansetron (ZOFRAN) IV, sodium chloride flush  Allergies:    Allergies  Allergen Reactions  . Losartan Rash and Other (See Comments)    Abdominal and leg rashes noted in 12/17    Social History:   Social History   Socioeconomic History  . Marital status: Single    Spouse name: Not on file  . Number of children: Not on file  . Years of education: Not on file  . Highest education level: Not on file  Social Needs  . Financial resource strain: Not on file  . Food insecurity - worry: Not on file  . Food insecurity - inability: Not on file  . Transportation needs - medical: Not on file  . Transportation needs - non-medical: Not on file  Occupational History  . Not on file  Tobacco Use  . Smoking status: Current Every Day Smoker    Packs/day: 2.00    Years: 30.00    Pack years: 60.00    Types: Cigarettes    Last attempt to quit: 12/24/2015    Years since quitting: 1.1  . Smokeless tobacco: Never Used  Substance and Sexual Activity  . Alcohol use: Yes    Comment: 5 glasses of alcohol per day    . Drug use: No  . Sexual activity: Not Currently  Other Topics Concern  . Not on file  Social History Narrative  . Not on file    Family History:    Family History  Problem Relation Age of Onset  . Hypertension Sister   . Cancer Neg Hx      ROS:  Please see the history of present illness.  ROS  All other ROS reviewed and negative.     Physical Exam/Data:   Vitals:   02/24/17 0754 02/24/17 0819 02/24/17 0904 02/24/17 0908  BP: 129/77 124/86    Pulse: 89 87    Resp: 16 14    Temp:      SpO2: 98% 96% 92% 92%   No intake or output data in the 24 hours ending 02/24/17 0908  There were no vitals filed for this visit. There is no height or weight on file to calculate BMI.  General:  Well nourished, well developed, in no acute distress HEENT: normal Lymph: no adenopathy Neck: no JVD Endocrine:  No thryomegaly Vascular: No carotid bruits; FA pulses 2+ bilaterally without bruits  Cardiac:  normal S1, S2; Irregularly irregular rhythm; 2/6 systolic murmur-intermittent at RUSB Lungs:  clear to auscultation bilaterally, except for few rales in RLL, no wheezing, rhonchi Abd: soft, nontender, no hepatomegaly  Ext: no edema Musculoskeletal:  No deformities, BUE and BLE strength normal and equal Skin: warm and dry  Neuro:  CNs 2-12 intact, no focal abnormalities noted Psych:  Normal affect   EKG:  The EKG was personally reviewed and demonstrates:  Sinus tachycardia, 125 bpm, LAE, consider biatrial enlargement,abnormal r wave progression, ?q waves V1-2, Nonspecific repol abnormality, diffuse leads Telemetry:  Telemetry was personally reviewed and demonstrates:  Sinus tach, initially in the 130's--> 105  Relevant CV Studies:  Right/Left Heart Cath and Coronary Angiography  06/10/2016  Conclusion    Prox RCA lesion, 40 %stenosed.  Dist RCA lesion, 25 %stenosed.  Ramus lesion, 25 %stenosed.  Mid LAD lesion, 20 %stenosed.  There is moderate left ventricular systolic  dysfunction.  LV end diastolic pressure is mildly elevated.  The left ventricular ejection fraction is 35-45% by visual estimate.  There is no aortic valve stenosis.  Ao sat 93%, PA sat 61%. CO 4.5 L/min. CI 2.2. Normal pulmonary artery pressures.  No AAA. Mild left renal artery stenosis. No right renal artery stenosis.   Nonobstructive coronary artery disease.  Patient with nonischemic cardiomyopathy, likely related to elevated blood pressure.  He needs aggressive BP control.  Increase coreg to 6.25 mg BID.  Will have him f/u in our HTN clinic with electrolytes to be checked.    --------------------------------------------------------------------------------------- Echocardiogram 05/06/2016 Study Conclusions  - Left ventricle: Diffuse hypokinesis worse in the inferior wall   The cavity size was moderately dilated. Wall thickness was   normal. Systolic function was mildly to moderately reduced. The   estimated ejection fraction was in the range of 40% to 45%.   Doppler parameters are consistent with abnormal left ventricular   relaxation (grade 1 diastolic dysfunction). - Aortic valve: There was mild regurgitation. - Mitral valve: There was mild regurgitation. - Left atrium: The atrium was moderately dilated. - Atrial septum: No defect or patent foramen ovale was identified.  Laboratory Data:  Chemistry Recent Labs  Lab 02/24/17 0319 02/24/17 0339  NA 140 142  K 3.7 3.7  CL 106 106  CO2 23  --   GLUCOSE 261* 262*  BUN 16 15  CREATININE 1.61* 1.60*  CALCIUM 9.1  --   GFRNONAA 43*  --   GFRAA 49*  --   ANIONGAP 11  --     Recent Labs  Lab 02/24/17 0319  PROT 8.0  ALBUMIN 4.0  AST 60*  ALT 29  ALKPHOS 97  BILITOT 1.2   Hematology Recent Labs  Lab 02/24/17 0319 02/24/17 0339  WBC 19.6*  --   RBC 4.84  --   HGB 16.5 17.7*  HCT 46.9 52.0  MCV 96.9  --   MCH 34.1*  --   MCHC 35.2  --   RDW 13.4  --   PLT 225  --    Cardiac Enzymes Recent Labs    Lab 02/24/17 0619  TROPONINI 0.11*    Recent Labs  Lab 02/24/17 0337  TROPIPOC 0.02  BNP Recent Labs  Lab 02/24/17 0320  BNP 1,167.4*    DDimer No results for input(s): DDIMER in the last 168 hours.  Radiology/Studies:  Dg Chest Portable 1 View  Result Date: 02/24/2017 CLINICAL DATA:  67 y/o M; shortness of breath and congestive heart failure. EXAM: PORTABLE CHEST 1 VIEW COMPARISON:  01/19/2016 chest radiograph FINDINGS: Mildly enlarged cardiac silhouette given projection and technique. Aortic atherosclerosis with calcification. Reticular and patchy opacities of the lungs. No pleural effusion or pneumothorax. No acute osseous abnormality is evident. IMPRESSION: Reticular and patchy opacities of lungs likely representing interstitial and alveolar pulmonary edema. Enlarged cardiac silhouette. Electronically Signed   By: Kristine Garbe M.D.   On: 02/24/2017 03:35    Assessment and Plan:   1. Acute on chronic combined systolic and diastolic CHF -Pt presented for dyspnea, orthopnea and PND -EF 35-45% by echo and cath earlier this year. Non-obstructive CAD.  -Non-ischemic CM likely related to elevated BP -Treated outpatient with carvedilol 25 mg BID, Imdur 30 mg daily, although pt admits that he has not taken any of his meds for at least a month -BNP 1167.4 -CXR: Reticular and patchy opacities of lungs likely representing interstitial and alveolar pulmonary edema. Enlarged cardiac silhouette. -Troponins 0.02, 0.11 likely demand ischemia in setting heart failure  -CHF exacerbation likely related to uncontrolled HTN, BP 237/136 -Started on lasix 40 mg IV BID. No I&O documented so far.  -On NTG drip for BP control. BP much improved.  -Strict I&O, daily wts -Will need to get pt back on his home meds, and make any adjustments for better BP control.   2. Hypertension -Outpatient treatment with amlodipine 5 mg, Imdur 30 mg, carvedilol 25 mg -BP initially 237/136 -pt on NTG  drip. BP improved 124/86 -Will need to get pt back on his home meds and adjust as needed for HTN control. He will need to follow up as outpt for optimal BP control. -I had a long talk with pt on need for blood pressure control with meds and close follow up to prevent further worsening of heart failure and to keep him feeling well and out of the hospital. He verbalizes understanding and plans to comply.  3. Tobacco use -Currenlty smokes 2 PPD. Counseled on smoking effects on the heart and advised on cessation.   4. Hyperlipidemia -Treated with rosuvastatin 10 mg daily- pt has not been taking.   5. Alcohol abuse -Admits to drinking a ping of whiskey per day. Advised on the ill effects of continued alcohol use in regards to heart failure.     For questions or updates, please contact [place] Please consult [email or web address] for contact info under Cardiology/STEMI.   Signed, Daune Perch, NP  02/24/2017 9:08 AM   Patient examined chart reviewed Discussed care with nurse He is doing much better now off bipap. Likely non ischemic DCM with medical non compliance and HTN urgency. No chest pain. Exam with overweight black male resting comfortably Now at 45 degrees Basilar rales No murmur trace edema. Would continue nitro for now Continue diuresis. Troponin bump Not significant in setting of CHF had non obstructive CAD by cath 06/10/16 Repeat echo. Given hs non compliance I don't think He is a good candidate for [employer]

## 2017-02-24 NOTE — ED Notes (Signed)
CARDIOLOGIST MD Provider at bedside. MADE AWARE OF TROPONIN 0.11. MADE AWARE OF OFF BIPAP AND ON ROOM AIR ONLY. MADE AWARE OF 1500 URINE OUT PUT, MADE AWARE OF WEANING OFF NITRO CURRENT RATE 6CC/HR FROM 8 CC/HR. MD STATED HE LIKE BP AT 136/74 (95). HOSPITALIST MD LAMA NOW PRESENT IN PT'S ROOM

## 2017-02-24 NOTE — ED Notes (Signed)
Patient arrives by [facility] traffic with diaphoretic grey male in severe respiratory distress-neb treatment in progress-unable to obtain monitor due to diaphoresis-patient is obtunded-placed in bed/RT at bedside/Dr. Randal Buba at bedside-patient placed on bipap-Lasix and IV Nitro initiated. Patient hypertensive and MP ST with rate 130's-RR>50. Temp foley placed for diuresis.

## 2017-02-24 NOTE — Progress Notes (Addendum)
Subjective: Patient admitted this morning, see detailed H&P by Dr Tamala Julian 67 y.o. male with medical history significant of systolic CHF 76-15% following left heart cath, CAD, HTN, and asthma; who presents with complaints of acute worsening of shortness of breath overnight    Vitals:   02/24/17 1100 02/24/17 1116  BP: 128/73   Pulse: 94 95  Resp: 20 18  Temp: 99.5 F (37.5 C) 99.5 F (37.5 C)  SpO2: (!) 89% 95%   on exam Chest-clear to auscultation bilaterally  Heart-S1 S2, regular  Assessment/plan respiratory failure with hypoxia hypertensive urgency hyperglycemia   67 year old male admitted with  acute hypoxic respiratory failure secondary to asthma exacerbation, pulmonary edema. Started on dialysis with IV Lasix, nitroglycerin fusion for hypertension. Cardiology has seen the patient and recommend no cardiac catheterization at this time. Patient had known obstructive CAD by cath on 06/10/16. Plan is to be transferred to [facility] under teaching service once bed becomes available.        [place] Hospitalist [phone]

## 2017-02-24 NOTE — Progress Notes (Signed)
  Echocardiogram 2D Echocardiogram has been performed.  Leonard [name] 02/24/2017, 4:40 PM

## 2017-02-24 NOTE — ED Notes (Signed)
Patient is less diaphoretic. MP ST with rate 119-120's. IV Nitro at 30 mcg with BP 148/95 at present time. RR decreased to 27. Foley cath intact and patient is diuresing from Lasix.

## 2017-02-24 NOTE — ED Notes (Signed)
Patient's girlfriend at bedside and states patient has been very SOB all day but he would not let her call the ambulance. Patient's girlfriend states one of the neighbors just called her.

## 2017-02-24 NOTE — Progress Notes (Signed)
Rt took pt off BIPAP/NIV and placed on RA. No distress noted at this time.

## 2017-02-24 NOTE — ED Notes (Signed)
TOLERATING ICE CHIPS AT PRESENT

## 2017-02-24 NOTE — ED Notes (Signed)
Bed: RESB Expected date:  Expected time:  Means of arrival:  Comments: EMS 

## 2017-02-24 NOTE — ED Notes (Signed)
Contact is girlfriend Vickki Muff [phone] call for any changes or if he gets moved to a room

## 2017-02-25 ENCOUNTER — Telehealth: Payer: Self-pay | Admitting: Internal Medicine

## 2017-02-25 LAB — BASIC METABOLIC PANEL
Anion gap: 12 (ref 5–15)
BUN: 19 mg/dL (ref 6–20)
CO2: 22 mmol/L (ref 22–32)
Calcium: 9.2 mg/dL (ref 8.9–10.3)
Chloride: 104 mmol/L (ref 101–111)
Creatinine, Ser: 1.34 mg/dL — ABNORMAL HIGH (ref 0.61–1.24)
GFR calc Af Amer: >60 mL/min (ref >60)
GFR calc non Af Amer: 53 mL/min — ABNORMAL LOW (ref >60)
Glucose, Bld: 152 mg/dL — ABNORMAL HIGH (ref 65–99)
Potassium: 3.3 mmol/L — ABNORMAL LOW (ref 3.5–5.1)
Sodium: 138 mmol/L (ref 135–145)

## 2017-02-25 LAB — GLUCOSE, CAPILLARY
Glucose-Capillary: 109 mg/dL — ABNORMAL HIGH (ref 65–99)
Glucose-Capillary: 114 mg/dL — ABNORMAL HIGH (ref 65–99)
Glucose-Capillary: 123 mg/dL — ABNORMAL HIGH (ref 65–99)
Glucose-Capillary: 131 mg/dL — ABNORMAL HIGH (ref 65–99)
Glucose-Capillary: 156 mg/dL — ABNORMAL HIGH (ref 65–99)
Glucose-Capillary: 96 mg/dL (ref 65–99)

## 2017-02-25 LAB — MRSA PCR SCREENING: MRSA by PCR: NEGATIVE

## 2017-02-25 MED ORDER — ISOSORBIDE DINITRATE 10 MG PO TABS
10.0000 mg | ORAL_TABLET | Freq: Three times a day (TID) | ORAL | Status: DC
  Administered 2017-02-25 – 2017-02-26 (×4): 10 mg via ORAL
  Filled 2017-02-25 (×4): qty 1

## 2017-02-25 MED ORDER — FUROSEMIDE 40 MG PO TABS
40.0000 mg | ORAL_TABLET | Freq: Two times a day (BID) | ORAL | Status: DC
  Administered 2017-02-25 – 2017-02-26 (×3): 40 mg via ORAL
  Filled 2017-02-25 (×3): qty 1

## 2017-02-25 MED ORDER — POTASSIUM CHLORIDE CRYS ER 20 MEQ PO TBCR
40.0000 meq | EXTENDED_RELEASE_TABLET | Freq: Once | ORAL | Status: AC
  Administered 2017-02-25: 40 meq via ORAL
  Filled 2017-02-25: qty 2

## 2017-02-25 MED ORDER — CARVEDILOL 6.25 MG PO TABS
6.2500 mg | ORAL_TABLET | Freq: Two times a day (BID) | ORAL | Status: DC
  Administered 2017-02-25 – 2017-02-26 (×3): 6.25 mg via ORAL
  Filled 2017-02-25 (×3): qty 1

## 2017-02-25 NOTE — Plan of Care (Signed)
Pt up ad lib. Ambulates independently with standby assist. No c/o dyspnea or shortness of breath. Will continue to monitor patient closely.

## 2017-02-25 NOTE — Telephone Encounter (Signed)
TOC HFU per Dr. Charlynn Grimes on 03/02/2017 with the [place] in the PM

## 2017-02-25 NOTE — Telephone Encounter (Signed)
Pt remains inpt 

## 2017-02-25 NOTE — Plan of Care (Signed)
  Progressing Clinical Measurements: Respiratory complications will improve 02/25/2017 0517 - Progressing by Colonel Bald, RN Note Pt on room air this shift; no respiratory complications noted. Nutrition: Adequate nutrition will be maintained 02/25/2017 0517 - Progressing by Colonel Bald, RN Note Nutrition adequate; good appetite.

## 2017-02-25 NOTE — Progress Notes (Signed)
   Transfer Note: Leonard [name] was initially admitted to [facility] on 1/17 after presenting with complaints of shortness of breath. Noted to have had progressive worsening of dyspnea with exertion over the past 1-2 weeks as well as orthopnea and PND. He had stopped taking all of his home medications about 1 month previously. On arrival he was hypoxic, tachycardic with hypertensive urgency (BP 237/136). Initially placed on BiPAP. Chest x-ray with pulmonary edema. BNP elevated > 1,000. Initially required nitro gtt for blood pressure control. Diuresed with lasix 40 mg IV bid. Mild elevation in troponin thought to be demand ischemia. Able to transition off gtt for blood pressure and resumed home coreg, nitrates and PO lasix. No recorded weights until this morning. I&O have not been documented well but net negative 1.7 L recorded. Transferred to [facility] for IMTS to assume care.   Subjective:  Patient is doing well this morning. No complaints. Denies any shortness of breath, chest pain, palpitations, orthopnea, PND, leg swelling.   Objective:  Vital signs in last 24 hours: Vitals:   02/25/17 0756 02/25/17 0823 02/25/17 0850 02/25/17 1237  BP:  (!) 141/83  (!) 110/7  Pulse:   87   Resp:      Temp:  97.8 F (36.6 C)  97.7 F (36.5 C)  TempSrc:  Axillary  Oral  SpO2: 95% 97%  96%  Weight:      Height:       GENERAL- alert, co-operative, appears as stated age, not in any distress. HEENT- Atraumatic, normocephalic, PERRL, EOMI, oral mucosa appears moist CARDIAC- RRR, no murmurs, rubs or gallops. No JVD. RESP- Moving equal volumes of air, and clear to auscultation bilaterally, no wheezes or crackles. ABDOMEN- Soft, nontender, bowel sounds present. NEURO- No obvious Cr N abnormality. EXTREMITIES- pulse 2+, symmetric, no pedal edema. SKIN- Warm, dry, No rash or lesion. PSYCH- Normal mood and affect, appropriate thought content and speech.  Assessment/Plan:  HFrEF exacerbation: In the  setting of self discontinuation of home medications. Has allergies to ARB. Has been restarted on his home coreg, nitrates and Lasix 40 mg PO bid. Weight today at 176 actually better than previous outpatient weights. Monitor I&Os, daily weights. Will ambulate patient today.   HTN: Resume home medications. BP mildly elevated in the 836O systolic on home meds. Can consider adding hydralazine if needed.  AKI on CKD III: Cr improved, 1.6 > 1.34. Baseline 1.2-1.3.   Hypokalemia: K 3.3 this morning. Likely 2/2 diuretics. Repleting.   Leukocytosis: Present on presentation. Likely reactive. Repeat CBC.  Dispo: Anticipated discharge in approximately 1 day(s).   Maryellen Pile, MD 02/25/2017, 1:21 PM Pager: [phone]

## 2017-02-25 NOTE — Progress Notes (Signed)
Progress Note  Patient Name: Leonard [name] Date of Encounter: 02/25/2017  Primary Cardiologist: [name], MD   Subjective   Much improved no chest pain or dyspnea   Inpatient Medications    Scheduled Meds: . enoxaparin (LOVENOX) injection  40 mg Subcutaneous Q24H  . furosemide  40 mg Intravenous BID  . insulin aspart  0-9 Units Subcutaneous Q4H  . ipratropium-albuterol  3 mL Nebulization QID  . sodium chloride flush  3 mL Intravenous Q12H   Continuous Infusions: . sodium chloride 250 mL (02/24/17 2100)  . nitroGLYCERIN 5 mcg/min (02/25/17 0542)   PRN Meds: sodium chloride, acetaminophen, ipratropium-albuterol, ondansetron (ZOFRAN) IV, sodium chloride flush   Vital Signs    Vitals:   02/25/17 0200 02/25/17 0341 02/25/17 0608 02/25/17 0756  BP: 122/67 129/77 (!) 124/98   Pulse: 76 73 77   Resp: 17 19 20    Temp:   98.2 F (36.8 C)   TempSrc:   Oral   SpO2: 94% 95% 95% 95%  Weight:   176 lb 1.6 oz (79.9 kg)   Height:        Intake/Output Summary (Last 24 hours) at 02/25/2017 0816 Last data filed at 02/25/2017 6295 Gross per 24 hour  Intake 738.76 ml  Output 2450 ml  Net -1711.24 ml   Filed Weights   02/24/17 2047 02/25/17 2841  Weight: 176 lb 6.4 oz (80 kg) 176 lb 1.6 oz (79.9 kg)    Telemetry    NSR 02/25/2017  - Personally Reviewed  ECG    NSR no acute ST changes rate 125   - Personally Reviewed  Physical Exam  Elderly black male  GEN: No acute distress.   Neck: No JVD Cardiac: RRR, no murmurs, rubs, or gallops.  Respiratory: Clear to auscultation bilaterally. GI: Soft, nontender, non-distended  MS: No edema; No deformity. Neuro:  Nonfocal  Psych: Normal affect   Labs    Chemistry Recent Labs  Lab 02/24/17 0319 02/24/17 0339  NA 140 142  K 3.7 3.7  CL 106 106  CO2 23  --   GLUCOSE 261* 262*  BUN 16 15  CREATININE 1.61* 1.60*  CALCIUM 9.1  --   PROT 8.0  --   ALBUMIN 4.0  --   AST 60*  --   ALT 29  --   ALKPHOS 97  --    BILITOT 1.2  --   GFRNONAA 43*  --   GFRAA 49*  --   ANIONGAP 11  --      Hematology Recent Labs  Lab 02/24/17 0319 02/24/17 0339  WBC 19.6*  --   RBC 4.84  --   HGB 16.5 17.7*  HCT 46.9 52.0  MCV 96.9  --   MCH 34.1*  --   MCHC 35.2  --   RDW 13.4  --   PLT 225  --     Cardiac Enzymes Recent Labs  Lab 02/24/17 0619 02/24/17 1215  TROPONINI 0.11* 0.18*    Recent Labs  Lab 02/24/17 0337  TROPIPOC 0.02     BNP Recent Labs  Lab 02/24/17 0320  BNP 1,167.4*     DDimer No results for input(s): DDIMER in the last 168 hours.   Radiology    Dg Chest Portable 1 View  Result Date: 02/24/2017 CLINICAL DATA:  67 y/o M; shortness of breath and congestive heart failure. EXAM: PORTABLE CHEST 1 VIEW COMPARISON:  01/19/2016 chest radiograph FINDINGS: Mildly enlarged cardiac silhouette given projection and technique. Aortic atherosclerosis  with calcification. Reticular and patchy opacities of the lungs. No pleural effusion or pneumothorax. No acute osseous abnormality is evident. IMPRESSION: Reticular and patchy opacities of lungs likely representing interstitial and alveolar pulmonary edema. Enlarged cardiac silhouette. Electronically Signed   By: Kristine Garbe M.D.   On: 02/24/2017 03:35    Cardiac Studies   Echo 02/24/17 EF 30-35%   Patient Profile     67 y.o. male with non compliance admitted with CHF requiring IV nitro and bibap Good diuresis Mild elevation in troponin from CHF no evidence of acute ischemic  Syndrome Cath 06/09/16 with no obstructive CAD  Assessment & Plan    CHF:  Related to non ischemic DCM and non compliance allergic to ARB continue coreg nitrates and diuretic Consider adding hydralazine as outpatient. D/C iv nitro Should be ready for d/c am  Troponin: elevation no chest pain secondary CHF cath 06/2016 no obstructive CAD  For questions or updates, please contact [place] Please consult [email or web address] for contact info under  Cardiology/STEMI.      Signed, Jenkins Rouge, MD  02/25/2017, 8:16 AM

## 2017-02-26 ENCOUNTER — Inpatient Hospital Stay (HOSPITAL_COMMUNITY): Payer: Medicare Other

## 2017-02-26 DIAGNOSIS — Z9114 Patient's other noncompliance with medication regimen: Secondary | ICD-10-CM

## 2017-02-26 DIAGNOSIS — I509 Heart failure, unspecified: Secondary | ICD-10-CM

## 2017-02-26 DIAGNOSIS — Z888 Allergy status to other drugs, medicaments and biological substances status: Secondary | ICD-10-CM

## 2017-02-26 DIAGNOSIS — I110 Hypertensive heart disease with heart failure: Secondary | ICD-10-CM

## 2017-02-26 LAB — BASIC METABOLIC PANEL
Anion gap: 11 (ref 5–15)
BUN: 22 mg/dL — ABNORMAL HIGH (ref 6–20)
CO2: 24 mmol/L (ref 22–32)
Calcium: 9.3 mg/dL (ref 8.9–10.3)
Chloride: 101 mmol/L (ref 101–111)
Creatinine, Ser: 1.18 mg/dL (ref 0.61–1.24)
GFR calc Af Amer: >60 mL/min (ref >60)
GFR calc non Af Amer: >60 mL/min (ref >60)
Glucose, Bld: 85 mg/dL (ref 65–99)
Potassium: 4.2 mmol/L (ref 3.5–5.1)
Sodium: 136 mmol/L (ref 135–145)

## 2017-02-26 LAB — GLUCOSE, CAPILLARY
Glucose-Capillary: 83 mg/dL (ref 65–99)
Glucose-Capillary: 87 mg/dL (ref 65–99)
Glucose-Capillary: 94 mg/dL (ref 65–99)
Glucose-Capillary: 95 mg/dL (ref 65–99)

## 2017-02-26 MED ORDER — ROSUVASTATIN CALCIUM 10 MG PO TABS: 10.0000 mg | ORAL_TABLET | Freq: Every day | ORAL | 0 refills | Status: DC

## 2017-02-26 MED ORDER — FUROSEMIDE 10 MG/ML IJ SOLN
20.0000 mg | Freq: Once | INTRAMUSCULAR | Status: AC
  Administered 2017-02-26: 20 mg via INTRAVENOUS
  Filled 2017-02-26: qty 2

## 2017-02-26 MED ORDER — FUROSEMIDE 40 MG PO TABS: 40.0000 mg | ORAL_TABLET | Freq: Two times a day (BID) | ORAL | 0 refills | Status: DC

## 2017-02-26 MED ORDER — ISOSORBIDE DINITRATE 10 MG PO TABS: 10.0000 mg | ORAL_TABLET | Freq: Three times a day (TID) | ORAL | 0 refills | Status: DC

## 2017-02-26 NOTE — Progress Notes (Signed)
Patient has private insurance with [facility] with prescription drug coverage; patient can see any physician of his choice due to private insurance policy;CM talked to patient, he was seeing [facility] but wants to change; CM talked to patient about several options, he chose [facility]; CM instructed patient to call their office on Monday morning to schedule a post hospital follow up apt. ( CM unable to schedule apt due to the weekend/ all office's are closed) Pharmacy of choice is [facility]; he expressed no problems getting his medication. Mindi Slick[facility] [phone]

## 2017-02-26 NOTE — Progress Notes (Signed)
Progress Note  Patient Name: Leonard [name] Date of Encounter: 02/26/2017  Primary Cardiologist: [name], MD   Subjective   Still with some dyspnea   Inpatient Medications    Scheduled Meds: . carvedilol  6.25 mg Oral BID WC  . enoxaparin (LOVENOX) injection  40 mg Subcutaneous Q24H  . furosemide  40 mg Oral BID  . insulin aspart  0-9 Units Subcutaneous Q4H  . isosorbide dinitrate  10 mg Oral TID  . sodium chloride flush  3 mL Intravenous Q12H   Continuous Infusions: . sodium chloride 250 mL (02/24/17 2100)   PRN Meds: sodium chloride, acetaminophen, ipratropium-albuterol, ondansetron (ZOFRAN) IV, sodium chloride flush   Vital Signs    Vitals:   02/25/17 2002 02/26/17 0012 02/26/17 0416 02/26/17 0754  BP: 116/73 126/86 (!) 139/92 138/87  Pulse: 75 81 73 84  Resp: 20 16 19 20   Temp: 97.6 F (36.4 C) (!) 97.3 F (36.3 C) (!) 97.5 F (36.4 C) 97.6 F (36.4 C)  TempSrc: Oral Oral Oral Oral  SpO2: 95% 96% 99% 95%  Weight:   175 lb 1.6 oz (79.4 kg)   Height:        Intake/Output Summary (Last 24 hours) at 02/26/2017 0820 Last data filed at 02/26/2017 0630 Gross per 24 hour  Intake -  Output 3125 ml  Net -3125 ml   Filed Weights   02/24/17 2047 02/25/17 0608 02/26/17 0416  Weight: 176 lb 6.4 oz (80 kg) 176 lb 1.6 oz (79.9 kg) 175 lb 1.6 oz (79.4 kg)    Telemetry    NSR 02/26/2017  - Personally Reviewed  ECG    NSR no acute ST changes rate 125   - Personally Reviewed  Physical Exam  Elderly black male  GEN: No acute distress.   Neck: No JVD Cardiac: RRR, no murmurs, rubs, or gallops.  Respiratory: mild expiratory wheezing  GI: Soft, nontender, non-distended  MS: No edema; No deformity. Neuro:  Nonfocal  Psych: Normal affect   Labs    Chemistry Recent Labs  Lab 02/24/17 0319 02/24/17 0339 02/25/17 0928  NA 140 142 138  K 3.7 3.7 3.3*  CL 106 106 104  CO2 23  --  22  GLUCOSE 261* 262* 152*  BUN 16 15 19   CREATININE 1.61* 1.60*  1.34*  CALCIUM 9.1  --  9.2  PROT 8.0  --   --   ALBUMIN 4.0  --   --   AST 60*  --   --   ALT 29  --   --   ALKPHOS 97  --   --   BILITOT 1.2  --   --   GFRNONAA 43*  --  53*  GFRAA 49*  --  >60  ANIONGAP 11  --  12     Hematology Recent Labs  Lab 02/24/17 0319 02/24/17 0339  WBC 19.6*  --   RBC 4.84  --   HGB 16.5 17.7*  HCT 46.9 52.0  MCV 96.9  --   MCH 34.1*  --   MCHC 35.2  --   RDW 13.4  --   PLT 225  --     Cardiac Enzymes Recent Labs  Lab 02/24/17 0619 02/24/17 1215  TROPONINI 0.11* 0.18*    Recent Labs  Lab 02/24/17 0337  TROPIPOC 0.02     BNP Recent Labs  Lab 02/24/17 0320  BNP 1,167.4*     DDimer No results for input(s): DDIMER in the last 168 hours.  Radiology    No results found.  Cardiac Studies   Echo 02/24/17 EF 30-35%   Patient Profile     67 y.o. male with non compliance admitted with CHF requiring IV nitro and bibap Good diuresis Mild elevation in troponin from CHF no evidence of acute ischemic  Syndrome Cath 06/09/16 with no obstructive CAD  Assessment & Plan    CHF:  Related to non ischemic DCM and non compliance allergic to ARB continue coreg nitrates and diuretic Consider adding hydralazine as outpatient.Check CXR should be ok to d/c latter today    Troponin: elevation no chest pain secondary CHF cath 06/2016 no obstructive CAD  For questions or updates, please contact [place] Please consult [email or web address] for contact info under Cardiology/STEMI.      Signed, Jenkins Rouge, MD  02/26/2017, 8:20 AM

## 2017-02-26 NOTE — Progress Notes (Signed)
Paged by nursing that patient was SOB after neb treatment. Went to bedside. Patient states he woke up from sleep SOB. He was lying flat. He states the nebulizer treatment did not relieve her shortness of breath. On exam the patient was resting comfortably in NAD. Lung exam with expiratory wheeze and faint crackles in lung bases bilaterally. VSS, saturating high 90s.   Plan: -20 mg IV lasix

## 2017-02-26 NOTE — Progress Notes (Signed)
SATURATION QUALIFICATIONS: (This note is used to comply with regulatory documentation for home oxygen)  Patient Saturations on Room Air at Rest =100%  Patient Saturations on Room Air while Ambulating = 96%  Patient Saturations on [place] of oxygen while Ambulating =96%  Please briefly explain why patient needs home oxygen:

## 2017-02-26 NOTE — Progress Notes (Signed)
Internal Medicine Attending:   I saw and examined the patient. I reviewed the resident's note and I agree with the resident's findings and plan as documented in the resident's note.  Patient did have an episode of shortness of breath and orthopnea overnight.  He received 1 dose of IV Lasix with resolution of his symptoms.  Patient was initially admitted with acute on chronic systolic heart failure exacerbation and was initially admitted to [facility] where he was aggressively diuresed.  He was transferred to [facility] yesterday and was already on oral diuretics at the time.  Patient appears euvolemic on exam today and repeat chest x-ray shows resolved pulmonary edema.  We will continue with carvedilol, Isordil and Lasix for now.  Will consider adding hydralazine as an outpatient if his blood pressure will tolerate it.  Patient stable for discharge home today if he is able to ambulate without dyspnea.  No further workup for now.  Case discussed with cardiology who is in agreement with plan.

## 2017-02-26 NOTE — Progress Notes (Addendum)
Pt refused walking ambulation with oxygen and check his saturation. Updated  MD  [phone]  and spoke with Dr. Frederico Hamman. Pt saturation dropped to 79 on room air when pt was ambulating.

## 2017-02-26 NOTE — Progress Notes (Signed)
Pt re assessed for walking saturation which pt  had agreed to have it reevaluated. pls see latest results.

## 2017-02-26 NOTE — Discharge Summary (Signed)
Name: Leonard [name] MRN: [id number] DOB: 1950/04/09 67 y.o. PCP: Maryellen Pile, MD  Date of Admission: 02/24/2017  3:06 AM Date of Discharge: 02/26/2017 Attending Physician: Aldine Contes, MD  Discharge Diagnosis: 1. HF exacerbation  2. Hypertensive urgency   Principal Problem:   Acute exacerbation of congestive heart failure (HCC) Active Problems:   Asthma   Hypertensive urgency   Pulmonary edema   Acute kidney injury superimposed on chronic kidney disease [facility])   Discharge Medications: Allergies as of 02/26/2017      Reactions   Losartan Rash, Other (See Comments)   Abdominal and leg rashes noted in 12/17      Medication List    STOP taking these medications   amLODipine 5 MG tablet Commonly known as:  NORVASC   isosorbide mononitrate 30 MG 24 hr tablet Commonly known as:  IMDUR   methylPREDNISolone 4 MG tablet Commonly known as:  MEDROL     TAKE these medications   albuterol 108 (90 Base) MCG/ACT inhaler Commonly known as:  PROVENTIL HFA;VENTOLIN HFA Inhale 2 puffs into the lungs every 6 (six) hours as needed for wheezing or shortness of breath.   albuterol (2.5 MG/3ML) 0.083% nebulizer solution Commonly known as:  PROVENTIL Take 3 mLs (2.5 mg total) by nebulization every 6 (six) hours as needed for wheezing or shortness of breath.   carvedilol 25 MG tablet Commonly known as:  COREG Take 1 tablet (25 mg total) by mouth 2 (two) times daily.   COMBIGAN 0.2-0.5 % ophthalmic solution Generic drug:  brimonidine-timolol Place 1 drop into both eyes daily.   furosemide 40 MG tablet Commonly known as:  LASIX Take 1 tablet (40 mg total) by mouth 2 (two) times daily.   isosorbide dinitrate 10 MG tablet Commonly known as:  ISORDIL Take 1 tablet (10 mg total) by mouth 3 (three) times daily.   latanoprost 0.005 % ophthalmic solution Commonly known as:  XALATAN Place 1 drop into both eyes at bedtime.   rosuvastatin 10 MG tablet Commonly known as:   CRESTOR Take 1 tablet (10 mg total) by mouth daily.   traMADol 50 MG tablet Commonly known as:  ULTRAM Take 1 tablet (50 mg total) by mouth every 8 (eight) hours as needed.       Disposition and follow-up:   Leonard [name] was discharged from [facility] in Stable condition.  At the hospital follow up visit please address:  1.  Please assess for signs/symptoms of volume overload. Please assess BP as patient might need adjustments in antihypertensive medications. Please assess for medication compliance.   2.  Labs / imaging needed at time of follow-up: BMP to check Cr   3.  Pending labs/ test needing follow-up: None   Follow-up Appointments: Follow-up Information    Maryellen Pile, MD Follow up.   Specialty:  Internal Medicine Contact information: [address] [place] [address] [phone]        Jettie Booze, MD Follow up.   Specialties:  Cardiology, Radiology, Interventional Cardiology Contact information: [address] [place] [address] [phone]           Hospital Course by problem list: Principal Problem:   Acute exacerbation of congestive heart failure [facility]) Active Problems:   Asthma   Hypertensive urgency   Pulmonary edema   Acute kidney injury superimposed on chronic kidney disease ([place])   1. HF exacerbation: Patient was admitted to [facility] on 1/17 after presenting with signs/symptoms of volume  overload (SOB, LE edema, hypoxia) in the setting of medication noncompliance for 1 month. He was aggressively diuresed with IV Lasix and restarted on his HF home medications. On arrival to [facility] patient was stable and only mildly hypervolemic on exam. He developed orthopnea that resolved after 1 dose of IV Lasix. Ambulatory O2 sats normal on day of discharge. Discussed with patient importance of medication compliance at home. Also discussed importance to establish with PCP for regular check-ups. He was  provided with a list of PCPs he can establish with in [place].   2. Hypertensive urgency: Patient had a BP227/136 as well as  SOB and hypoxic on arrival to [facility] in the setting of medication noncompliance. He initially required BiPAP and nitro gtt for BP control. On arrival to [facility] patient was normotensive and on home oral medications. He remained normotensive during his admission. He was discharged on Coreg, Lasix 40 BID, and isosorbide dinitrate. In the future, can consider adding hydralazine if he remains hypertensive on current regimen. He is allergic to ARBs.   Discharge Vitals:   BP (!) 136/93 (BP Location: Right Arm)   Pulse 80   Temp (!) 97.5 F (36.4 C) (Oral)   Resp 20   Ht 5' 7.5" (1.715 m)   Wt 175 lb 1.6 oz (79.4 kg)   SpO2 95%   BMI 27.02 kg/m   Pertinent Labs, Studies, and Procedures:   CBC Latest Ref Rng & Units 02/28/2017 02/24/2017 02/24/2017  WBC 4.0 - 10.5 K/uL 13.8(H) - 19.6(H)  Hemoglobin 13.0 - 17.0 g/dL 14.4 17.7(H) 16.5  Hematocrit 39.0 - 52.0 % 41.0 52.0 46.9  Platelets 150 - 400 K/uL 202 - 225   BMP Latest Ref Rng & Units 02/28/2017 02/26/2017 02/25/2017  Glucose 65 - 99 mg/dL 135(H) 85 152(H)  BUN 6 - 20 mg/dL 19 22(H) 19  Creatinine 0.61 - 1.24 mg/dL 1.46(H) 1.18 1.34(H)  BUN/Creat Ratio 10 - 24 - - -  Sodium 135 - 145 mmol/L 135 136 138  Potassium 3.5 - 5.1 mmol/L 4.4 4.2 3.3(L)  Chloride 101 - 111 mmol/L 98(L) 101 104  CO2 22 - 32 mmol/L 26 24 22   Calcium 8.9 - 10.3 mg/dL 9.2 9.3 9.2   CXR 1/17: IMPRESSION: Reticular and patchy opacities of lungs likely representing interstitial and alveolar pulmonary edema. Enlarged cardiac [place].  TTE 1/17: Study Conclusions - Left ventricle: The cavity size was mildly dilated. There was   mild concentric hypertrophy. Systolic function was moderately to   severely reduced. The estimated ejection fraction was in the   range of 30% to 35%. Diffuse hypokinesis. Doppler parameters are   consistent with abnormal  left ventricular relaxation (grade 1   diastolic dysfunction). There was no evidence of elevated   ventricular filling pressure by Doppler parameters. - Aortic valve: There was moderate regurgitation. - Mitral valve: There was mild regurgitation. - Left atrium: The atrium was moderately dilated. - Right ventricle: The cavity size was normal. Wall thickness was   normal. Systolic function was normal. - Right atrium: The atrium was normal in size. - Tricuspid valve: There was trivial regurgitation. - Pulmonary arteries: Systolic pressure was within the normal   range. - Inferior vena cava: The vessel was normal in size. - Pericardium, extracardiac: There was no pericardial effusion.  CXR 1/19: FINDINGS: Mild cardiomegaly. Interstitial opacity seen previously is resolved. There is no edema, consolidation, effusion, or pneumothorax.  Discharge Instructions: Discharge Instructions    (HEART FAILURE PATIENTS) Call MD:  April Manson  you have any of the following symptoms: 1) 3 pound weight gain in 24 hours or 5 pounds in 1 week 2) shortness of breath, with or without a dry hacking cough 3) swelling in the hands, feet or stomach 4) if you have to sleep on extra pillows at night in order to breathe.   Complete by:  As directed    Diet - low sodium heart healthy   Complete by:  As directed    Discharge instructions   Complete by:  As directed    You were admitted to the hospital due to retaining too much fluid because of a heart failure exacerbation.  We removed a lot of fluid with IV medications and was also restarted all your home medications for heart failure.  You will continue taking Lasix 40 mg twice daily, Coreg 6.25 mg twice a day, Isordil 10 mg 3 times a day, and Crestor 10 mg one daily.  We gave you printed prescriptions of all these medications that he can take to your pharmacy to fill.  It is very important that you continue to take these medications every day as this will avoid future  hospitalizations.  You were given a list of general doctors located in [place].  Please make sure to pick a provider that you can establish with and can follow-up with you in a regular basis.  In the meantime you have a one-time hospital follow-up appointment at our clinic on 1/23 at 1:45pm.  Please call us if you have any questions.   Increase activity slowly   Complete by:  As directed       Signed: Welford Roche, MD 02/28/2017, 6:16 AM   Pager: [phone]

## 2017-02-26 NOTE — Progress Notes (Signed)
Subjective:  Patient had one episode of shortness of breath overnight and reported orthopnea.  He received 1 dose of IV Lasix 20 mg with resolution of symptoms.  This morning he is feeling well.  Denies chest pain shortness of breath and states he is ready to go home.  Discussed importance of compliance with heart failure medications to avoid exacerbations in the future.  Discussed with patient need to establish with a PCP and will provide with list of providers located in [place].  In the meantime he states that he will continue to follow-up with Dr. Scarlette [name].   Objective:  Vital signs in last 24 hours: Vitals:   02/25/17 2002 02/26/17 0012 02/26/17 0416 02/26/17 0754  BP: 116/73 126/86 (!) 139/92 138/87  Pulse: 75 81 73 84  Resp: 20 16 19 20   Temp: 97.6 F (36.4 C) (!) 97.3 F (36.3 C) (!) 97.5 F (36.4 C) 97.6 F (36.4 C)  TempSrc: Oral Oral Oral Oral  SpO2: 95% 96% 99% 95%  Weight:   175 lb 1.6 oz (79.4 kg)   Height:       Physical Exam  Constitutional: He is oriented to person, place, and time and well-developed, well-nourished, and in no distress. No distress.  HENT:  Head: Normocephalic and atraumatic.  Cardiovascular: Normal rate, regular rhythm and normal heart sounds. Exam reveals no gallop and no friction rub.  No murmur heard. Pulmonary/Chest: Effort normal and breath sounds normal. No respiratory distress. He has no rales.  There is mild expiratory wheezes   Abdominal: Soft. Bowel sounds are normal. He exhibits no distension. There is no tenderness.  Musculoskeletal: Normal range of motion.  There is trace-1+ LE pitting edema bilaterally.   Neurological: He is alert and oriented to person, place, and time.  Skin: Skin is warm and dry. He is not diaphoretic.    Assessment/Plan:  Principal Problem:   Acute exacerbation of congestive heart failure (HCC) Active Problems:   Asthma   Hypertensive urgency   Pulmonary edema   Acute kidney injury superimposed  on chronic kidney disease ([place])  # HFrEF exacerbation: Patient presented with volume overload after in the setting of self discontinuation of home medications.  He was initially admitted and was long and transferred to [facility] on 1/18.   By the time he arrived to [facility], he had been aggressively diuresed and was already on p.o. diuretics as well as home medications listed below.  His weight remains stable at 175 and he appears euvolemic on exam.  Repeat chest x-ray shows resolved pulmonary edema.  Will ambulate patient today and discharge home if he does well.   - Continue home Coreg 6.25 mg BID, Isordil 10 mg TID, and Lasix 40 mg BID - Per cardiology, can consider adding hydralazine if blood pressure remains elevated in the future  - Ambulatory O sats  - Anticipate discharge home today after ambulatory O 2 sats   # HTN: Normotensive while on home medications.  Per cardiology, can consider adding hydralazine if blood pressure remains elevated in the future. - Continue home Coreg 6.25 mg BID, Isordil 10 mg TID, and Lasix 40 mg BID  # AKI on CKD III: Likely hypoperfusion in the setting of volume overload secondary to heart failure exacerbation. SCr 1.18 this morning. Baseline 1.2-1.3.   # Hypokalemia: In the setting of diuresis.  K 4.2 this morning.  # Leukocytosis: WBC 19.6 1/17 thought to be reactive leukocytosis. No repeat CBC in chart. Given patient does not  have any signs/symptoms of an infectious etiology will not repeat CBC.  Will recommend repeat CBC at follow-up.    Dispo: Anticipated discharge today.   Welford Roche, MD 02/26/2017, 11:08 AM Pager: [phone]

## 2017-02-28 ENCOUNTER — Encounter (HOSPITAL_COMMUNITY): Payer: Self-pay | Admitting: Radiology

## 2017-02-28 ENCOUNTER — Emergency Department (HOSPITAL_COMMUNITY): Payer: Medicare Other

## 2017-02-28 ENCOUNTER — Other Ambulatory Visit: Payer: Self-pay

## 2017-02-28 ENCOUNTER — Observation Stay (HOSPITAL_COMMUNITY)
Admission: EM | Admit: 2017-02-28 | Discharge: 2017-03-01 | Disposition: A | Discharge status: Home / Self Care | Payer: Medicare Other | Attending: Internal Medicine | Admitting: Internal Medicine

## 2017-02-28 ENCOUNTER — Telehealth: Payer: Self-pay | Admitting: Interventional Cardiology

## 2017-02-28 DIAGNOSIS — J45909 Unspecified asthma, uncomplicated: Secondary | ICD-10-CM | POA: Diagnosis not present

## 2017-02-28 DIAGNOSIS — N183 Chronic kidney disease, stage 3 (moderate): Secondary | ICD-10-CM | POA: Insufficient documentation

## 2017-02-28 DIAGNOSIS — D72829 Elevated white blood cell count, unspecified: Secondary | ICD-10-CM | POA: Insufficient documentation

## 2017-02-28 DIAGNOSIS — I5022 Chronic systolic (congestive) heart failure: Secondary | ICD-10-CM | POA: Diagnosis not present

## 2017-02-28 DIAGNOSIS — Z9114 Patient's other noncompliance with medication regimen: Secondary | ICD-10-CM | POA: Insufficient documentation

## 2017-02-28 DIAGNOSIS — Z79899 Other long term (current) drug therapy: Secondary | ICD-10-CM | POA: Insufficient documentation

## 2017-02-28 DIAGNOSIS — I2699 Other pulmonary embolism without acute cor pulmonale: Secondary | ICD-10-CM | POA: Diagnosis not present

## 2017-02-28 DIAGNOSIS — N179 Acute kidney failure, unspecified: Secondary | ICD-10-CM | POA: Diagnosis not present

## 2017-02-28 DIAGNOSIS — Z888 Allergy status to other drugs, medicaments and biological substances status: Secondary | ICD-10-CM | POA: Insufficient documentation

## 2017-02-28 DIAGNOSIS — I429 Cardiomyopathy, unspecified: Secondary | ICD-10-CM | POA: Insufficient documentation

## 2017-02-28 DIAGNOSIS — E782 Mixed hyperlipidemia: Secondary | ICD-10-CM | POA: Diagnosis not present

## 2017-02-28 DIAGNOSIS — C61 Malignant neoplasm of prostate: Secondary | ICD-10-CM | POA: Insufficient documentation

## 2017-02-28 DIAGNOSIS — I2510 Atherosclerotic heart disease of native coronary artery without angina pectoris: Secondary | ICD-10-CM | POA: Diagnosis not present

## 2017-02-28 DIAGNOSIS — I509 Heart failure, unspecified: Secondary | ICD-10-CM

## 2017-02-28 DIAGNOSIS — R079 Chest pain, unspecified: Secondary | ICD-10-CM | POA: Diagnosis not present

## 2017-02-28 DIAGNOSIS — J439 Emphysema, unspecified: Secondary | ICD-10-CM | POA: Insufficient documentation

## 2017-02-28 DIAGNOSIS — I712 Thoracic aortic aneurysm, without rupture: Secondary | ICD-10-CM | POA: Insufficient documentation

## 2017-02-28 DIAGNOSIS — F17210 Nicotine dependence, cigarettes, uncomplicated: Secondary | ICD-10-CM | POA: Insufficient documentation

## 2017-02-28 DIAGNOSIS — I130 Hypertensive heart and chronic kidney disease with heart failure and stage 1 through stage 4 chronic kidney disease, or unspecified chronic kidney disease: Secondary | ICD-10-CM | POA: Insufficient documentation

## 2017-02-28 LAB — COMPREHENSIVE METABOLIC PANEL
ALT: 30 U/L (ref 17–63)
AST: 28 U/L (ref 15–41)
Albumin: 3.7 g/dL (ref 3.5–5.0)
Alkaline Phosphatase: 68 U/L (ref 38–126)
Anion gap: 11 (ref 5–15)
BUN: 19 mg/dL (ref 6–20)
CO2: 26 mmol/L (ref 22–32)
Calcium: 9.2 mg/dL (ref 8.9–10.3)
Chloride: 98 mmol/L — ABNORMAL LOW (ref 101–111)
Creatinine, Ser: 1.46 mg/dL — ABNORMAL HIGH (ref 0.61–1.24)
GFR calc Af Amer: 56 mL/min — ABNORMAL LOW (ref >60)
GFR calc non Af Amer: 48 mL/min — ABNORMAL LOW (ref >60)
Glucose, Bld: 135 mg/dL — ABNORMAL HIGH (ref 65–99)
Potassium: 4.4 mmol/L (ref 3.5–5.1)
Sodium: 135 mmol/L (ref 135–145)
Total Bilirubin: 1.4 mg/dL — ABNORMAL HIGH (ref 0.3–1.2)
Total Protein: 6.9 g/dL (ref 6.5–8.1)

## 2017-02-28 LAB — URINALYSIS, ROUTINE W REFLEX MICROSCOPIC
Bilirubin Urine: NEGATIVE
Glucose, UA: NEGATIVE mg/dL
Ketones, ur: NEGATIVE mg/dL
Leukocytes, UA: NEGATIVE
Nitrite: NEGATIVE
Protein, ur: 100 mg/dL — AB
Specific Gravity, Urine: 1.01 (ref 1.005–1.030)
pH: 5 (ref 5.0–8.0)

## 2017-02-28 LAB — CBC
HCT: 41 % (ref 39.0–52.0)
Hemoglobin: 14.4 g/dL (ref 13.0–17.0)
MCH: 33.5 pg (ref 26.0–34.0)
MCHC: 35.1 g/dL (ref 30.0–36.0)
MCV: 95.3 fL (ref 78.0–100.0)
Platelets: 202 10*3/uL (ref 150–400)
RBC: 4.3 MIL/uL (ref 4.22–5.81)
RDW: 12.9 % (ref 11.5–15.5)
WBC: 13.8 10*3/uL — ABNORMAL HIGH (ref 4.0–10.5)

## 2017-02-28 LAB — I-STAT TROPONIN, ED
Troponin i, poc: 0.02 ng/mL (ref 0.00–0.08)
Troponin i, poc: 0.01 ng/mL (ref 0.00–0.08)

## 2017-02-28 LAB — D-DIMER, QUANTITATIVE: D-Dimer, Quant: 0.89 ug/mL-FEU — ABNORMAL HIGH (ref 0.00–0.50)

## 2017-02-28 LAB — BRAIN NATRIURETIC PEPTIDE: B Natriuretic Peptide: 176.6 pg/mL — ABNORMAL HIGH (ref 0.0–100.0)

## 2017-02-28 MED ORDER — BRIMONIDINE TARTRATE 0.2 % OP SOLN
1.0000 [drp] | Freq: Every day | OPHTHALMIC | Status: DC
  Administered 2017-02-28 – 2017-03-01 (×2): 1 [drp] via OPHTHALMIC
  Filled 2017-02-28: qty 5

## 2017-02-28 MED ORDER — ISOSORBIDE DINITRATE 10 MG PO TABS
10.0000 mg | ORAL_TABLET | Freq: Three times a day (TID) | ORAL | Status: DC
  Administered 2017-02-28 – 2017-03-01 (×3): 10 mg via ORAL
  Filled 2017-02-28 (×5): qty 1

## 2017-02-28 MED ORDER — HEPARIN BOLUS VIA INFUSION
4500.0000 [IU] | Freq: Once | INTRAVENOUS | Status: AC
  Administered 2017-02-28: 4500 [IU] via INTRAVENOUS
  Filled 2017-02-28: qty 4500

## 2017-02-28 MED ORDER — TIMOLOL MALEATE 0.5 % OP SOLN
1.0000 [drp] | Freq: Every day | OPHTHALMIC | Status: DC
  Administered 2017-02-28 – 2017-03-01 (×2): 1 [drp] via OPHTHALMIC
  Filled 2017-02-28: qty 5

## 2017-02-28 MED ORDER — IOPAMIDOL (ISOVUE-370) INJECTION 76%
INTRAVENOUS | Status: AC
  Administered 2017-02-28: 100 mL
  Filled 2017-02-28: qty 100

## 2017-02-28 MED ORDER — BRIMONIDINE TARTRATE-TIMOLOL 0.2-0.5 % OP SOLN
1.0000 [drp] | Freq: Every day | OPHTHALMIC | Status: DC
  Filled 2017-02-28: qty 5

## 2017-02-28 MED ORDER — RIVAROXABAN 15 MG PO TABS
15.0000 mg | ORAL_TABLET | Freq: Two times a day (BID) | ORAL | Status: DC
  Administered 2017-02-28 – 2017-03-01 (×2): 15 mg via ORAL
  Filled 2017-02-28 (×2): qty 1

## 2017-02-28 MED ORDER — HEPARIN (PORCINE) IN NACL 100-0.45 UNIT/ML-% IJ SOLN
1300.0000 [IU]/h | INTRAMUSCULAR | Status: DC
  Administered 2017-02-28: 1300 [IU]/h via INTRAVENOUS
  Filled 2017-02-28: qty 250

## 2017-02-28 MED ORDER — ACETAMINOPHEN 500 MG PO TABS
1000.0000 mg | ORAL_TABLET | Freq: Four times a day (QID) | ORAL | Status: DC | PRN
  Administered 2017-02-28 – 2017-03-01 (×2): 1000 mg via ORAL
  Filled 2017-02-28 (×2): qty 2

## 2017-02-28 MED ORDER — SODIUM CHLORIDE 0.9 % IV SOLN
INTRAVENOUS | Status: DC
  Administered 2017-02-28 – 2017-03-01 (×2): via INTRAVENOUS

## 2017-02-28 MED ORDER — CARVEDILOL 25 MG PO TABS
25.0000 mg | ORAL_TABLET | Freq: Two times a day (BID) | ORAL | Status: DC
  Administered 2017-02-28 – 2017-03-01 (×2): 25 mg via ORAL
  Filled 2017-02-28 (×2): qty 1

## 2017-02-28 MED ORDER — ROSUVASTATIN CALCIUM 10 MG PO TABS
10.0000 mg | ORAL_TABLET | Freq: Every day | ORAL | Status: DC
  Administered 2017-02-28 – 2017-03-01 (×2): 10 mg via ORAL
  Filled 2017-02-28 (×2): qty 1

## 2017-02-28 MED ORDER — FENTANYL CITRATE (PF) 100 MCG/2ML IJ SOLN
25.0000 ug | Freq: Once | INTRAMUSCULAR | Status: AC
  Administered 2017-02-28: 25 ug via INTRAVENOUS
  Filled 2017-02-28: qty 2

## 2017-02-28 MED ORDER — FUROSEMIDE 10 MG/ML IJ SOLN
40.0000 mg | Freq: Once | INTRAMUSCULAR | Status: AC
  Administered 2017-02-28: 40 mg via INTRAVENOUS
  Filled 2017-02-28: qty 4

## 2017-02-28 MED ORDER — DICLOFENAC SODIUM 1 % TD GEL
4.0000 g | Freq: Four times a day (QID) | TRANSDERMAL | Status: DC
  Administered 2017-02-28 – 2017-03-01 (×4): 4 g via TOPICAL
  Filled 2017-02-28: qty 100

## 2017-02-28 MED ORDER — ACETAMINOPHEN 325 MG PO TABS: 650.0000 mg | ORAL_TABLET | Freq: Four times a day (QID) | ORAL | Status: DC | PRN

## 2017-02-28 NOTE — ED Provider Notes (Signed)
[place] EMERGENCY DEPARTMENT Provider Note   CSN: [id number] Arrival date & time: 02/28/17  0350     History   Chief Complaint Chief Complaint  Patient presents with  . Shortness of Breath  . Chest Pain    HPI Leonard [name] is a 67 y.o. male with a hx of asthma, hypertension, prostate cancer presents to the Emergency Department complaining of gradual, persistent, progressively worsening sided chest pain onset 10 PM.  Patient reports that he has had some worsening shortness of breath and orthopnea since his discharge home on 02/26/2017 patient reports he was sent home with a prescription for Lasix and has been taking 40 mg twice daily.  Yesterday was his first day and he has therefore had 2 doses.  He denies nausea, diaphoresis, syncope or near syncope.  Patient reports that cough, deep breaths, movement and palpation make his chest pain significantly worse.  Resting makes it better.  He denies on exertion.  Patient also denies fever, chills, rigors, abdominal pain, vomiting, diarrhea, weakness, dizziness, syncope.    The history is provided by the patient and medical records. No language interpreter was used.    Past Medical History:  Diagnosis Date  . Asthma   . Hypertension   . Prostate cancer [facility])     Patient Active Problem List   Diagnosis Date Noted  . Acute exacerbation of congestive heart failure ([place]) 02/24/2017  . CHF exacerbation ([place]) 02/24/2017  . Hypertensive urgency 02/24/2017  . Pulmonary edema 02/24/2017  . Acute kidney injury superimposed on chronic kidney disease ([place]) 02/24/2017  . Hypertensive heart disease 09/27/2016  . Tobacco abuse 09/27/2016  . Mixed hyperlipidemia 09/27/2016  . History of tobacco abuse 06/25/2016  . Allergic sinusitis 04/17/2016  . Asthma 01/19/2016  . CKD (chronic kidney disease) stage 2, GFR 60-89 ml/min 01/19/2016  . Aspiration pneumonitis ([place])   . Essential hypertension   . Heart failure with reduced  ejection fraction ([place]) 01/10/2016  . Malignant neoplasm of prostate ([place]) 07/03/2013    Past Surgical History:  Procedure Laterality Date  . APPENDECTOMY    . HYDROCELE EXCISION Left 12/01/2015   Procedure: REPAIR OF LEFT HYDROCELE;  Surgeon: Raynelle Bring, MD;  Location: [facility];  Service: Urology;  Laterality: Left;  . LYMPHADENECTOMY Bilateral 09/24/2013   Procedure: LYMPHADENECTOMY;  Surgeon: Raynelle Bring, MD;  Location: [facility];  Service: Urology;  Laterality: Bilateral;  . PROSTATE BIOPSY    . RIGHT/LEFT HEART CATH AND CORONARY ANGIOGRAPHY N/A 06/10/2016   Procedure: Right/Left Heart Cath and Coronary Angiography;  Surgeon: Jettie Booze, MD;  Location: [place] CV LAB;  Service: Cardiovascular;  Laterality: N/A;  . ROBOT ASSISTED LAPAROSCOPIC RADICAL PROSTATECTOMY N/A 09/24/2013   Procedure: ROBOTIC ASSISTED LAPAROSCOPIC RADICAL PROSTATECTOMY LEVEL 2;  Surgeon: Raynelle Bring, MD;  Location: [facility];  Service: Urology;  Laterality: N/A;       Home Medications    Prior to Admission medications   Medication Sig Start Date End Date Taking? Authorizing Provider  albuterol (PROVENTIL HFA;VENTOLIN HFA) 108 (90 Base) MCG/ACT inhaler Inhale 2 puffs into the lungs every 6 (six) hours as needed for wheezing or shortness of breath.    [provider]  albuterol (PROVENTIL) (2.5 MG/3ML) 0.083% nebulizer solution Take 3 mLs (2.5 mg total) by nebulization every 6 (six) hours as needed for wheezing or shortness of breath. 01/19/16   Milagros Loll, MD  carvedilol (COREG) 25 MG tablet Take 1 tablet (25 mg total) by mouth 2 (two)  times daily. Patient not taking: Reported on 02/24/2017 09/27/16 12/26/16  Jettie Booze, MD  COMBIGAN 0.2-0.5 % ophthalmic solution Place 1 drop into both eyes daily. 07/15/16   [provider]  furosemide (LASIX) 40 MG tablet Take 1 tablet (40 mg total) by mouth 2 (two) times daily. 02/26/17   Welford Roche, MD  isosorbide dinitrate  (ISORDIL) 10 MG tablet Take 1 tablet (10 mg total) by mouth 3 (three) times daily. 02/26/17   Santos-Sanchez, Merlene Morse, MD  latanoprost (XALATAN) 0.005 % ophthalmic solution Place 1 drop into both eyes at bedtime.  03/04/16   [provider]  rosuvastatin (CRESTOR) 10 MG tablet Take 1 tablet (10 mg total) by mouth daily. 02/26/17 05/27/17  Santos-Sanchez, Merlene Morse, MD  traMADol (ULTRAM) 50 MG tablet Take 1 tablet (50 mg total) by mouth every 8 (eight) hours as needed. 11/03/16   Landis Martins, DPM    Family History Family History  Problem Relation Age of Onset  . Hypertension Sister   . Cancer Neg Hx     Social History Social History   Tobacco Use  . Smoking status: Current Every Day Smoker    Packs/day: 2.00    Years: 30.00    Pack years: 60.00    Types: Cigarettes    Last attempt to quit: 12/24/2015    Years since quitting: 1.1  . Smokeless tobacco: Never Used  Substance Use Topics  . Alcohol use: Yes    Comment: 5 glasses of alcohol per day  . Drug use: No     Allergies   Losartan   Review of Systems Review of Systems  Constitutional: Negative for appetite change, diaphoresis, fatigue, fever and unexpected weight change.  HENT: Negative for mouth sores.   Eyes: Negative for visual disturbance.  Respiratory: Positive for shortness of breath. Negative for cough, chest tightness and wheezing.   Cardiovascular: Positive for chest pain.  Gastrointestinal: Negative for abdominal pain, constipation, diarrhea, nausea and vomiting.  Endocrine: Negative for polydipsia, polyphagia and polyuria.  Genitourinary: Negative for dysuria, frequency, hematuria and urgency.  Musculoskeletal: Negative for back pain and neck stiffness.  Skin: Negative for rash.  Allergic/Immunologic: Negative for immunocompromised state.  Neurological: Negative for syncope, light-headedness and headaches.  Hematological: Does not bruise/bleed easily.  Psychiatric/Behavioral: Negative for sleep  disturbance. The patient is not nervous/anxious.      Physical Exam Updated Vital Signs BP 107/66   Pulse 76   Temp 97.6 F (36.4 C) (Oral)   Resp 19   Ht 5' 7.5" (1.715 m)   Wt 79.4 kg (175 lb)   SpO2 91%   BMI 27.00 kg/m   Physical Exam  Constitutional: He appears well-developed and well-nourished. No distress.  Awake, alert, nontoxic appearance  HENT:  Head: Normocephalic and atraumatic.  Mouth/Throat: Oropharynx is clear and moist. No oropharyngeal exudate.  Eyes: Conjunctivae are normal. No scleral icterus.  Neck: Normal range of motion. Neck supple.  Cardiovascular: Normal rate, regular rhythm and intact distal pulses.  Pulmonary/Chest: Effort normal. No respiratory distress. He has no wheezes. He has rales in the right lower field and the left lower field. He exhibits tenderness.  Equal chest expansion    Abdominal: Soft. Bowel sounds are normal. He exhibits no mass. There is no tenderness. There is no rebound and no guarding.  Musculoskeletal: Normal range of motion. He exhibits no edema.  Neurological: He is alert.  Speech is clear and goal oriented Moves extremities without ataxia  Skin: Skin is warm and dry. He  is not diaphoretic.  No calf tenderness  Psychiatric: He has a normal mood and affect.  Nursing note and vitals reviewed.    ED Treatments / Results  Labs (all labs ordered are listed, but only abnormal results are displayed) Labs Reviewed  CBC - Abnormal; Notable for the following components:      Result Value   WBC 13.8 (*)    All other components within normal limits  COMPREHENSIVE METABOLIC PANEL - Abnormal; Notable for the following components:   Chloride 98 (*)    Glucose, Bld 135 (*)    Creatinine, Ser 1.46 (*)    Total Bilirubin 1.4 (*)    GFR calc non Af Amer 48 (*)    GFR calc Af Amer 56 (*)    All other components within normal limits  BRAIN NATRIURETIC PEPTIDE - Abnormal; Notable for the following components:   B Natriuretic  Peptide 176.6 (*)    All other components within normal limits  URINALYSIS, ROUTINE W REFLEX MICROSCOPIC - Abnormal; Notable for the following components:   Hgb urine dipstick MODERATE (*)    Protein, ur 100 (*)    Bacteria, UA RARE (*)    Squamous Epithelial / LPF 0-5 (*)    All other components within normal limits  D-DIMER, QUANTITATIVE (NOT AT [facility])  I-STAT TROPONIN, ED  I-STAT TROPONIN, ED    EKG  EKG Interpretation  Date/Time:  Monday February 28 2017 03:56:44 EST Ventricular Rate:  76 PR Interval:    QRS Duration: 117 QT Interval:  433 QTC Calculation: 487 R Axis:   23 Text Interpretation:  Sinus rhythm LAE, consider biatrial enlargement Left ventricular hypertrophy Anterior Q waves, possibly due to LVH Minimal ST elevation, inferior leads No acute changes Nonspecific ST and T wave abnormality Confirmed by Varney Biles [phone]) on 02/28/2017 4:47:23 AM       Radiology Dg Chest 2 View  Result Date: 02/28/2017 CLINICAL DATA:  Right-sided chest pain and shortness of breath tonight. Nausea and vomiting. Hypertension. Smoker. EXAM: CHEST  2 VIEW COMPARISON:  02/26/2017 FINDINGS: Cardiac enlargement. No vascular congestion. No edema or consolidation. No blunting of costophrenic angles. No pneumothorax. Mediastinal contours appear intact. IMPRESSION: Cardiac enlargement.  No evidence of active pulmonary disease. Electronically Signed   By: Lucienne Capers M.D.   On: 02/28/2017 04:21     Procedures Procedures (including critical care time)  Medications Ordered in ED Medications  furosemide (LASIX) injection 40 mg (not administered)     Initial Impression / Assessment and Plan / ED Course  I have reviewed the triage vital signs and the nursing notes.  Pertinent labs & imaging results that were available during my care of the patient were reviewed by me and considered in my medical decision making (see chart for details).     Patient presents with right-sided chest  pain and shortness of breath.  Chest pain is reproducible.  Patient with mild rales in the bases however he is not hypoxic.  Chest x-ray shows no evidence of pulmonary edema but does show some persistent cardiac enlargement.  Patient concerned that his Lasix is not working and was given IV Lasix here in the emergency department.  No pitting edema of the bilateral lower extremities.  No calf tenderness.  Will obtain d-dimer and repeat troponin.  If negative patient may be discharged home.  At shift change care was transferred to [facility], PA-C who will follow pending studies, re-evaulate and determine disposition.    Final Clinical Impressions(s) /  ED Diagnoses   Final diagnoses:  Right-sided chest pain  Congestive heart failure, unspecified HF chronicity, unspecified heart failure type [facility])    ED Discharge Orders    None       Yasuo Phimmasone, Gwenlyn Perking 02/28/17 6811    Maribel Hadley, Jarrett Soho, PA-C 02/28/17 5726    Ezequiel Essex, MD 02/28/17 [phone]

## 2017-02-28 NOTE — ED Provider Notes (Signed)
Care assumed from previous provider PA [place]. Please see note for further details. Case discussed, plan agreed upon. Will follow up on d-dimer and repeat troponin. If negative, discharge to home.   Repeat troponin negative. D-dimer elevated at 0.89. Will proceed with CT angio. Patient updated on plan of care. He does feel improved after lasix but still complaining of some chest pain.  CT angio with acute PE within the right middle lobe. No right heart strain. Started on heparin per pharmacy. Consulted [facility]MT[facility] who will admit.   CRITICAL CARE Performed by: Ozella Almond Ward  Total critical care time: 35 minutes  Critical care time was exclusive of separately billable procedures and treating other patients.  Critical care was necessary to treat or prevent imminent or life-threatening deterioration.  Critical care was time spent personally by me on the following activities: development of treatment plan with patient and/or surrogate as well as nursing, discussions with consultants, evaluation of patient's response to treatment, examination of patient, obtaining history from patient or surrogate, ordering and performing treatments and interventions, ordering and review of laboratory studies, ordering and review of radiographic studies, pulse oximetry and re-evaluation of patient's condition.    Ward, Ozella Almond, PA-C 02/28/17 1100    Ezequiel Essex, MD 02/28/17 2255

## 2017-02-28 NOTE — H&P (Signed)
Date: 02/28/2017               Patient Name:  Leonard [name] MRN: [id number]  DOB: 1950/12/15 Age / Sex: 67 y.o., male   PCP: Maryellen Pile, MD         Medical Service: Internal Medicine Teaching Service         Attending Physician: Dr. Aldine Contes, MD    First Contact: Dr. Frederico Hamman Pager: [phone]  Second Contact: Dr. Hetty Ely  Pager: [phone]       After Hours (After 5p/  First Contact Pager: [phone]  weekends / holidays): Second Contact Pager: [phone]   Chief Complaint: Chest pain and shortness of breath   History of Present Illness:  Leonard [name] is a 67 yo male with history of HFrEF (EF 30%, diffuse hypokinesis, G1DD), CAD, HTN, and asthma who presents to the ED after acute onset of right-sided chest pain and shortness of breath.  Patient was recently discharged after presenting with volume overload secondary to heart failure exacerbation in the setting of medication noncompliance.  He was discharged on 1/19 in stable condition.  Ambulatory O2 saturations was normal on day of discharge.  He was doing well until the night prior to presentation when he developed acute onset of right-sided chest pain that worsened with deep breathing as well as shortness of breath.  He also endorses chills and productive cough that is improving.  Reports compliance with home medications.  Denies LE pain and  edema, headache, fever, abdominal pain, N/V, urinary symptoms, and changes in bowel movements.  ED course: Patient was afebrile, hemodynamically stable, and satting well on room air on arrival to the ED.  Lab work remarkable for leukocytosis of 13.8, AK I with creatinine 1.46, elevated d-dimer 0.89, BNP 1766 (1167 5 days ago). Troponin negative x1. CXR with cardiomegaly but no acute pulmonary processes. CTA chest with acute PE within the right middle lobe lateral segmental artery and mild ascending thoracic aorta aneurysm.  He received IV Lasix 40 mg x1 and was started on a heparin  drip.  Review of Systems: A complete ROS was negative except as per HPI.  Meds:  Current Meds  Medication Sig  . albuterol (PROVENTIL HFA;VENTOLIN HFA) 108 (90 Base) MCG/ACT inhaler Inhale 2 puffs into the lungs every 6 (six) hours as needed for wheezing or shortness of breath.  Marland Kitchen albuterol (PROVENTIL) (2.5 MG/3ML) 0.083% nebulizer solution Take 3 mLs (2.5 mg total) by nebulization every 6 (six) hours as needed for wheezing or shortness of breath.  . carvedilol (COREG) 25 MG tablet Take 1 tablet (25 mg total) by mouth 2 (two) times daily.  . COMBIGAN 0.2-0.5 % ophthalmic solution Place 1 drop into both eyes daily.  . furosemide (LASIX) 40 MG tablet Take 1 tablet (40 mg total) by mouth 2 (two) times daily.  . isosorbide dinitrate (ISORDIL) 10 MG tablet Take 1 tablet (10 mg total) by mouth 3 (three) times daily.  Marland Kitchen latanoprost (XALATAN) 0.005 % ophthalmic solution Place 1 drop into both eyes at bedtime.   . rosuvastatin (CRESTOR) 10 MG tablet Take 1 tablet (10 mg total) by mouth daily.     Allergies: Allergies as of 02/28/2017 - Review Complete 02/28/2017  Allergen Reaction Noted  . Losartan Rash and Other (See Comments) 02/18/2016   Past Medical History:  Diagnosis Date  . Asthma   . Hypertension   . Prostate cancer [facility])     Family History:  Family History  Problem Relation Age  of Onset  . Hypertension Sister   . Cancer Neg Hx     Social History:  Social History   Socioeconomic History  . Marital status: Single    Spouse name: None  . Number of children: None  . Years of education: None  . Highest education level: None  Social Needs  . Financial resource strain: None  . Food insecurity - worry: None  . Food insecurity - inability: None  . Transportation needs - medical: None  . Transportation needs - non-medical: None  Occupational History  . None  Tobacco Use  . Smoking status: Current Every Day Smoker    Packs/day: 2.00    Years: 30.00    Pack years: 60.00     Types: Cigarettes    Last attempt to quit: 12/24/2015    Years since quitting: 1.1  . Smokeless tobacco: Never Used  Substance and Sexual Activity  . Alcohol use: Yes    Comment: 5 glasses of alcohol per day  . Drug use: No  . Sexual activity: Not Currently  Other Topics Concern  . None  Social History Narrative  . None     Physical Exam: Blood pressure (!) 129/93, pulse 80, temperature 98 F (36.7 C), temperature source Oral, resp. rate (!) 22, height 5' 7.5" (1.715 m), weight 175 lb (79.4 kg), SpO2 95 %.  Physical Exam  Constitutional: He is oriented to person, place, and time. He appears well-developed and well-nourished. No distress.  Cardiovascular: Normal rate, regular rhythm and normal heart sounds. Exam reveals no gallop and no friction rub.  No murmur heard. Pulmonary/Chest: Effort normal and breath sounds normal. No respiratory distress. He has no wheezes. He has no rales.  Abdominal: Soft. Bowel sounds are normal. He exhibits no distension. There is no tenderness. There is no rebound.  Musculoskeletal: Normal range of motion. He exhibits no edema, tenderness or deformity.  Neurological: He is alert and oriented to person, place, and time.  Skin: Skin is warm. He is not diaphoretic.  Nursing note and vitals reviewed.   EKG: personally reviewed my interpretation is sinus rhythm, HR 76 bpm, mildly prolonged QTC 487 MS, left atrial enlargement, LVH, no signs of acute ischemia  CXR: personally reviewed my interpretation is unremarkable, no evidence of acute pulmonary processes  Assessment & Plan by Problem: Active Problems:   Pulmonary embolism ([place])  # PE: Patient presented with acute onset right-sided chest pain shortness of breath and found to have a R middle lobe lateral segmental artery.  He is mildly hypoxic on room air with O2 saturation 91-93% but appears comfortable on room air.  There is no evidence of right heart strain.  EKG w/o signs of acute ischemia  and troponin negative x1. No lower extremity pain or edema suggestive of DVT.  - Discontinue heparin gtt  - Will start Xarelto 15 mg BID x 21 days then 20 mg QD x 3-6 months  - Ambulatory O2 sats  - Pain control: voltaren gel + Tylenol 1000 QID PRN. May add tramadol if continues to complain of pain   # HFrEF # HTN  Recently admitted with heart failure exacerbation and hypertensive urgency in the setting of medication noncompliance.  Euvolemic on discharge day with weight 175.  He continues to be euvolemic on exam and weight is stable. - We will hold home Lasix in setting of AKI  - Continue home Coreg 25 mg BID - Continue Crestor 10 mg QD  - Continue Isordil 10 TID  #  AKI CKD Stage III: Baseline Cr 1.2-1.3. Admission labs show SCr 1.46 s/p IV Lasix 40 mg x1.  - Hold home Lasix - BMP in AM  - Avoid nephrotoxic agents   F: none  E: monitoring  N: HH diet   VTE ppx: heparin gtt --> Xarelto   Code status: Full code, not confirmed on admission    Dispo: Admit patient to Inpatient with expected length of stay greater than 2 midnights.  SignedWelford Roche, MD 02/28/2017, 1:58 PM  Pager: [phone]

## 2017-02-28 NOTE — Discharge Instructions (Addendum)
Information on my medicine - XARELTO (rivaroxaban)  This medication education was reviewed with me or my healthcare representative as part of my discharge preparation.  The pharmacist that spoke with me during my hospital stay was:  Carnella [place], [facility]? Xarelto was prescribed to treat blood clots that may have been found in the veins of your legs (deep vein thrombosis) or in your lungs (pulmonary embolism) and to reduce the risk of them occurring again.  What do you need to know about Xarelto? The starting dose is one 15 mg tablet taken TWICE daily with food for the FIRST 21 DAYS then on (enter date) 03/22/17  the dose is changed to one 20 mg tablet taken ONCE A DAY with your evening meal.  DO NOT stop taking Xarelto without talking to the health care provider who prescribed the medication.  Refill your prescription for 20 mg tablets before you run out.  After discharge, you should have regular check-up appointments with your healthcare provider that is prescribing your Xarelto.  In the future your dose may need to be changed if your kidney function changes by a significant amount.  What do you do if you miss a dose? If you are taking Xarelto TWICE DAILY and you miss a dose, take it as soon as you remember. You may take two 15 mg tablets (total 30 mg) at the same time then resume your regularly scheduled 15 mg twice daily the next day.  If you are taking Xarelto ONCE DAILY and you miss a dose, take it as soon as you remember on the same day then continue your regularly scheduled once daily regimen the next day. Do not take two doses of Xarelto at the same time.   Important Safety Information Xarelto is a blood thinner medicine that can cause bleeding. You should call your healthcare provider right away if you experience any of the following: ? Bleeding from an injury or your nose that does not stop. ? Unusual colored urine (red or dark brown) or  unusual colored stools (red or black). ? Unusual bruising for unknown reasons. ? A serious fall or if you hit your head (even if there is no bleeding).  Some medicines may interact with Xarelto and might increase your risk of bleeding while on Xarelto. To help avoid this, consult your healthcare provider or pharmacist prior to using any new prescription or non-prescription medications, including herbals, vitamins, non-steroidal anti-inflammatory drugs (NSAIDs) and supplements.  This website has more information on Xarelto: [email or web address]-benson.[email or web address]

## 2017-02-28 NOTE — ED Notes (Signed)
Pt back from x-ray.

## 2017-02-28 NOTE — ED Triage Notes (Signed)
Pt BIB [facility] for shortness of breath and chest pain that began approx 4 hours PTA. Pt was recently released from [facility] after having a CHF exacerbation with pulmonary edema. Was started on lasix today. States his pain is right sided and worse with movement and inspiration. Pt got 324 ASA and 5mg  albuterol from EMS

## 2017-02-28 NOTE — Progress Notes (Signed)
ANTICOAGULATION CONSULT NOTE - Initial Consult  Pharmacy Consult for heparin Indication: pulmonary embolus  Allergies  Allergen Reactions  . Losartan Rash and Other (See Comments)    Abdominal and leg rashes noted in 12/17    Patient Measurements: Height: 5' 7.5" (171.5 cm) Weight: 175 lb (79.4 kg) IBW/kg (Calculated) : 67.25 Heparin Dosing Weight: 79.4kg  Vital Signs: Temp: 97.6 F (36.4 C) (01/21 0353) Temp Source: Oral (01/21 0353) BP: 133/80 (01/21 0915) Pulse Rate: 71 (01/21 0915)  Labs: Recent Labs    02/26/17 0803 02/28/17 0400  HGB  --  14.4  HCT  --  41.0  PLT  --  202  CREATININE 1.18 1.46*    Estimated Creatinine Clearance: 46.7 mL/min (A) (by C-G formula based on SCr of 1.46 mg/dL (H)).   Medical History: Past Medical History:  Diagnosis Date  . Asthma   . Hypertension   . Prostate cancer ([place])     Medications:  Infusions:  . heparin      Assessment: 16 yom presented to the ED with SOB and CP. Found to have an acute PE but no right heart strain. Baseline H/H and platelets are WNL. He is not on anticoagulation PTA.   Goal of Therapy:  Heparin level 0.3-0.7 units/ml Monitor platelets by anticoagulation protocol: Yes   Plan:  Heparin bolus 4500 units IV x 1 Heparin gtt 1300 units/hr Check a 6 hr heparin level Daily heparin level and CBC F/u plans for long-term oral anticoagulation  Gottfried Standish, Rande Lawman 02/28/2017,10:33 AM

## 2017-02-28 NOTE — ED Notes (Signed)
Patient transported to CT 

## 2017-02-28 NOTE — ED Notes (Signed)
Patient transported to X-ray 

## 2017-03-01 ENCOUNTER — Encounter (HOSPITAL_COMMUNITY): Payer: Self-pay | Admitting: Nurse Practitioner

## 2017-03-01 DIAGNOSIS — I2699 Other pulmonary embolism without acute cor pulmonale: Secondary | ICD-10-CM

## 2017-03-01 DIAGNOSIS — Z888 Allergy status to other drugs, medicaments and biological substances status: Secondary | ICD-10-CM | POA: Diagnosis not present

## 2017-03-01 LAB — CBC
HCT: 41.1 % (ref 39.0–52.0)
Hemoglobin: 14.5 g/dL (ref 13.0–17.0)
MCH: 33.3 pg (ref 26.0–34.0)
MCHC: 35.3 g/dL (ref 30.0–36.0)
MCV: 94.3 fL (ref 78.0–100.0)
Platelets: 216 10*3/uL (ref 150–400)
RBC: 4.36 MIL/uL (ref 4.22–5.81)
RDW: 12.8 % (ref 11.5–15.5)
WBC: 13.6 10*3/uL — ABNORMAL HIGH (ref 4.0–10.5)

## 2017-03-01 LAB — BASIC METABOLIC PANEL
Anion gap: 13 (ref 5–15)
BUN: 19 mg/dL (ref 6–20)
CO2: 22 mmol/L (ref 22–32)
Calcium: 9.2 mg/dL (ref 8.9–10.3)
Chloride: 99 mmol/L — ABNORMAL LOW (ref 101–111)
Creatinine, Ser: 1.17 mg/dL (ref 0.61–1.24)
GFR calc Af Amer: >60 mL/min (ref >60)
GFR calc non Af Amer: >60 mL/min (ref >60)
Glucose, Bld: 107 mg/dL — ABNORMAL HIGH (ref 65–99)
Potassium: 4 mmol/L (ref 3.5–5.1)
Sodium: 134 mmol/L — ABNORMAL LOW (ref 135–145)

## 2017-03-01 MED ORDER — RIVAROXABAN (XARELTO) VTE STARTER PACK (15 & 20 MG): ORAL_TABLET | ORAL | 0 refills | Status: DC

## 2017-03-01 MED ORDER — FUROSEMIDE 40 MG PO TABS: 40.0000 mg | ORAL_TABLET | Freq: Two times a day (BID) | ORAL | Status: DC

## 2017-03-01 MED ORDER — FUROSEMIDE 40 MG PO TABS
40.0000 mg | ORAL_TABLET | Freq: Two times a day (BID) | ORAL | Status: DC
  Administered 2017-03-01: 40 mg via ORAL
  Filled 2017-03-01: qty 1

## 2017-03-01 MED ORDER — RAMELTEON 8 MG PO TABS
8.0000 mg | ORAL_TABLET | Freq: Once | ORAL | Status: AC
  Administered 2017-03-01: 8 mg via ORAL
  Filled 2017-03-01: qty 1

## 2017-03-01 NOTE — Progress Notes (Signed)
Pt has orders to be discharged. Discharge instructions given and pt has no additional questions at this time. Medication regimen reviewed and pt educated. Pt verbalized understanding and has no additional questions. Telemetry box removed. IVs removed and sites in good condition. Pt stable and waiting for transportation. 

## 2017-03-01 NOTE — Consult Note (Signed)
   [place] Inpatient Consult   03/01/2017  Toben Acuna [birth year] [id number]  Patient screened for potential [facility] services. Patient is in the [facility] of the [facility] services under patient's [employer] Medicare plan. Met with the patient and his friend Myer Peer at the bedside.  Patient gave permission to speak in front of his friend regarding [facility]. Patient states he drives and he has a new primary care provider, Grier Mitts, MD, [facility] to establish care and wanted to cancel the appointment to Internal Medicine. He states he does not weigh and take his medicines like he is suppose to. States, "I am not used to all of these changes and medicines."  Patient feels that he will see how his new physician.  Patient accepted the brochure and 24 hour nurse advise line. Patient will receive post hospital follow up calls. For questions contact:   Natividad Brood, RN BSN [facility] Liaison  [phone] business mobile phone Toll free office [phone]

## 2017-03-01 NOTE — Progress Notes (Signed)
   Subjective:  No acute events overnight. Patient continues to complain of right-sided chest pain that worsens with deep inspiration.  He had a difficult time sleeping last night due to chest pain.  States Tylenol helps but Voltaren gel does not provide relief.  Explained to patient this is normal and expected in the setting of an acute pulmonary embolism.  Explained this is referred pain from his right lung and it should improve with time.  Discussed he will need to be on anticoagulation with Xarelto for the next 6 months emphasized importance of compliance to embolic events in the future.  Advised patient to establish with a PCP in town who can follow him and address compliance with medications.  Patient voiced understanding and is in agreement with plan.  All questions answered.  Objective:  Vital signs in last 24 hours: Vitals:   02/28/17 1900 02/28/17 1932 03/01/17 0009 03/01/17 0621  BP:  119/70 (!) 144/71 112/86  Pulse:  81 77 81  Resp:  18 18 18   Temp:  98 F (36.7 C) 98 F (36.7 C) 97.6 F (36.4 C)  TempSrc: Oral Oral Oral Oral  SpO2:  98% 98% 98%  Weight:    168 lb 12.8 oz (76.6 kg)  Height:       General: Very pleasant male, well-nourished, well-developed, sitting up in chair in no acute distress Cardiac: regular rate and rhythm, nl S1/S2, no murmurs, rubs or gallops  Pulm: CTAB, no wheezes or crackles, no increased work of breathing  Abd: soft, NTND, bowel sounds present Neuro: A&Ox3, able to move all 4 extremities spontaneously, no focal deficits noted Ext: warm and well perfused, no peripheral edema noted    Assessment/Plan:  Active Problems:   Pulmonary embolism (HCC)  # PE: Patient presented with acute onset right-sided chest pain shortness of breath and found to have a R middle lobe lateral segmental artery.  He was transitioned from a heparin drip to loading dose of Xarelto yesterday. He remains hemodynamically stable and is oxygenating well on room air.   Ambulatory O2 sats normal today.  We will discharge home today and sent a Xarelto starter pack to his pharmacy.  Patient advised to establish with a PCP in town to continue to monitor his anticoagulation.  He schedule a follow-up appointment at [facility] on 1/31.  - Continue start Xarelto 15 mg BID x 21 days then 20 mg QD x 3-6 months  - Pain control: voltaren gel + Tylenol 1000 QID PRN. May add tramadol if continues to complain of pain  - Discharge home today   # HFrEF # HTN  Recently admitted with heart failure exacerbation and hypertensive urgency in the setting of medication noncompliance.  Euvolemic on discharge day with weight 175.  He continues to be euvolemic on exam. Discharge weight 168 lbs.  - Resume home Lasix 40 mg BID  - Continue home Coreg 25 mg BID - Continue Crestor 10 mg QD  - Continue Isordil 10 TID  # AKI on CKD Stage III: Baseline Cr 1.2-1.3. Resolved, SCr at baseline.  - Avoid nephrotoxic agents  - Resume home Lasix as above    Dispo: Anticipated discharge today.   Welford Roche, MD 03/01/2017, 7:11 AM Pager: [phone]

## 2017-03-01 NOTE — Progress Notes (Signed)
  Date: 03/01/2017  Patient name: Plumer Mittelstaedt  Medical record number: [id number]  Date of birth: 05/25/1950   I have seen and evaluated [name] and discussed their care with the Residency Team.  In brief, patient is a 67 year-old male with a past medical history of chronic systolic heart failure with an EF of 30%, CAD, hypertension and asthma who presented to the ED with acute onset of right sided chest pain and shortness of breath over the last day.  Patient was recently admitted to the hospital for an acute on chronic systolic heart failure exacerbation in the setting of medication noncompliance and was discharged on February 26, 2017.  Patient states he felt well today after discharge but then developed sudden onset of right-sided chest pain that worsened with deep inspiration and had associated shortness of breath.  Patient also complained of a productive cough and associated chills.  No lightheadedness, no syncope, no palpitations, diaphoresis, no nausea or vomiting, no abdominal pain, no diarrhea, no headache, no blurry vision, tingling or numbness, no focal weakness.  Today patient complains of persistent right-sided chest pain but shortness of breath has improved.  PMHx, Fam Hx, and/or Soc Hx : As per resident admit note  Vitals:   03/01/17 0009 03/01/17 0621  BP: (!) 144/71 112/86  Pulse: 77 81  Resp: 18 18  Temp: 98 F (36.7 C) 97.6 F (36.4 C)  SpO2: 98% 98%   General: Awake alert and oriented x3, NAD CVS: Regular rate and rhythm, normal heart sounds Lungs: CTA bilaterally, patient is tender to palpation over right chest wall Abdomen: Soft, nontender, nondistended, normoactive bowel sounds Extremities: No edema noted  Assessment and Plan: I have seen and evaluated the patient as outlined above. I agree with the formulated Assessment and Plan as detailed in the residents' note, with the following changes:   1.  Acute PE: -Patient presents with acute onset of  right-sided chest pain with associated shortness of breath and was found to have a right middle lobe PE with no evidence of right heart strain on CTA chest.  Oxygen saturations today have been in in the 90s and per RN note patient is able to ambulate without any difficulty and with no hypoxia. -Etiology of his PE remains uncertain at this time as even though he was recently hospitalized he was on DVT prophylaxis while in the hospital -He will need 6 months of anticoagulation for unprovoked PE and possible hypercoagulable workup once he completes this course -Will transition him to oral Xarelto today.  Co-pay is $8.50 at his pharmacy -Continue pain control for his right-sided chest pain -Patient is stable for discharge home today.    Aldine Contes, MD 1/22/20192:05 PM

## 2017-03-01 NOTE — Progress Notes (Signed)
Pt stable, vitals stable, slept on and off, paged MD regarding medicine to sleep on patient's request, IV fluid continue at 75cc/hr, will continue to monitor the patient  Palma Holter, RN

## 2017-03-01 NOTE — Discharge Summary (Signed)
Name: Leonard [name] MRN: [id number] DOB: 27-May-1950 67 y.o. PCP: No primary care provider on file.  Date of Admission: 02/28/2017  3:50 AM Date of Discharge: 03/01/2017 Attending Physician: Aldine Contes, MD  Discharge Diagnosis: 1. Acute Pulmonary Embolism  Active Problems:   Pulmonary embolism [facility])   Discharge Medications: Allergies as of 03/01/2017      Reactions   Losartan Rash, Other (See Comments)   Abdominal and leg rashes noted in 12/17      Medication List    TAKE these medications   albuterol 108 (90 Base) MCG/ACT inhaler Commonly known as:  PROVENTIL HFA;VENTOLIN HFA Inhale 2 puffs into the lungs every 6 (six) hours as needed for wheezing or shortness of breath.   albuterol (2.5 MG/3ML) 0.083% nebulizer solution Commonly known as:  PROVENTIL Take 3 mLs (2.5 mg total) by nebulization every 6 (six) hours as needed for wheezing or shortness of breath.   carvedilol 25 MG tablet Commonly known as:  COREG Take 1 tablet (25 mg total) by mouth 2 (two) times daily.   COMBIGAN 0.2-0.5 % ophthalmic solution Generic drug:  brimonidine-timolol Place 1 drop into both eyes daily.   furosemide 40 MG tablet Commonly known as:  LASIX Take 1 tablet (40 mg total) by mouth 2 (two) times daily.   isosorbide dinitrate 10 MG tablet Commonly known as:  ISORDIL Take 1 tablet (10 mg total) by mouth 3 (three) times daily.   latanoprost 0.005 % ophthalmic solution Commonly known as:  XALATAN Place 1 drop into both eyes at bedtime.   Rivaroxaban 15 & 20 MG Tbpk Take as directed on package: Start with one 15mg  tablet by mouth twice a day with food. On Day 22, switch to one 20mg  tablet once a day with food.   rosuvastatin 10 MG tablet Commonly known as:  CRESTOR Take 1 tablet (10 mg total) by mouth daily.   traMADol 50 MG tablet Commonly known as:  ULTRAM Take 1 tablet (50 mg total) by mouth every 8 (eight) hours as needed.       Disposition and follow-up:     [name] was discharged from [facility] in Stable condition.  At the hospital follow up visit please address:  1.  Please assess respiratory status and compliance with Xarelto. He will need to take Xarelto for 6 months in the setting of first episode of unprovoked PE. Please also address compliance with heart failure medications as patient was recently discharge after treatment for HF exacerbation after medication noncompliance for 1 month. Patient will need annual imaging follow up of ascending thoracic aorta aneurysm found on CTA during this admission (report below).   2.  Labs / imaging needed at time of follow-up: None   3.  Pending labs/ test needing follow-up: None   Follow-up Appointments: Follow-up Information    [facility] Follow up on 03/10/2017.   Why:  [place] by problem list: Active Problems:   Pulmonary embolism ([place])   1. Acute pulmonary embolism: Patient presented with a 1-day history of acute onset of R-sided chest pain. He was hemodynamically stable and with no new oxygen requirements on presentation. D-dimer elevated and CTA chest ordered which revealed a R middle lobe lateral segmental artery (report below). No evidence of DVT present. He had a recent 4-day long hospitalization (1/17-1/19) during which he received DVT prophylaxis with SQ Lovenox. He was on a heparin gtt for 8  hours and was transitioned to Xarelto. He was instructed to take Xarelto 15 mg BID x 21 days followed by Xarelto 20 mg x 6 months. He remained hemodynamically stable during this admission without evidence of R heart strain. There was no hypoxia noted when he was ambulated on day of discharge. He was given a short course of tramadol (2 days, 8 tablets) for chest pain. He will establish with [facility] and has a follow up appointment with his PCP on 1/31.   Discharge Vitals:   BP 112/86 (BP Location: Right Arm)   Pulse 81   Temp 97.6 F (36.4  C) (Oral)   Resp 18   Ht 5\' 7"  (1.702 m)   Wt 168 lb 12.8 oz (76.6 kg) Comment: scale a  SpO2 98%   BMI 26.44 kg/m   Pertinent Labs, Studies, and Procedures:   CBC Latest Ref Rng & Units 03/01/2017 02/28/2017 02/24/2017  WBC 4.0 - 10.5 K/uL 13.6(H) 13.8(H) -  Hemoglobin 13.0 - 17.0 g/dL 14.5 14.4 17.7(H)  Hematocrit 39.0 - 52.0 % 41.1 41.0 52.0  Platelets 150 - 400 K/uL 216 202 -   BMP Latest Ref Rng & Units 03/01/2017 02/28/2017 02/26/2017  Glucose 65 - 99 mg/dL 107(H) 135(H) 85  BUN 6 - 20 mg/dL 19 19 22(H)  Creatinine 0.61 - 1.24 mg/dL 1.17 1.46(H) 1.18  BUN/Creat Ratio 10 - 24 - - -  Sodium 135 - 145 mmol/L 134(L) 135 136  Potassium 3.5 - 5.1 mmol/L 4.0 4.4 4.2  Chloride 101 - 111 mmol/L 99(L) 98(L) 101  CO2 22 - 32 mmol/L 22 26 24   Calcium 8.9 - 10.3 mg/dL 9.2 9.2 9.3    CXR 1/21: FINDINGS: Cardiac enlargement. No vascular congestion. No edema or consolidation. No blunting of costophrenic angles. No pneumothorax. Mediastinal contours appear intact.  CTA chest 1/21: IMPRESSION: 1. Acute pulmonary embolism within the right middle lobe lateral segmental artery. No right heart strain. 2. Mild aneurysmal dilatation of the ascending thoracic aorta, measuring 4.0 cm. Recommend annual imaging followup by CTA or MRA. This recommendation follows 2010 ACCF/AHA/AATS/ACR/ASA/SCA/SCAI/SIR/STS/SVM Guidelines for the Diagnosis and Management of Patients with Thoracic Aortic Disease. Circulation. 2010; 121: R443-X540 3. Moderate central peribronchial thickening, likely smoking-related. 4.  Emphysema    Discharge Instructions: Discharge Instructions    Call MD for:  difficulty breathing, headache or visual disturbances   Complete by:  As directed    Call MD for:  extreme fatigue   Complete by:  As directed    Call MD for:  persistant dizziness or light-headedness   Complete by:  As directed    Diet - low sodium heart healthy   Complete by:  As directed    Discharge instructions    Complete by:  As directed    You were admitted to the hospital due to a blood clot in your right lung. You will need to be on a blood thinner called Xarelto for the next 6 months. You will take 1 tablet of 15 mg two times a day for 21 days and then you will take 1 tablet of 20 mg for 6 months. We sent a prescription for a Xarelto pack to your pharmacy. You will need to pick it up today. Your regular doctor at [facility] will need to give you a prescription for the 20 mg dose. Please continue taking your medications for heart failure. Call us if you have any questions.   Increase activity slowly   Complete by:  As directed  SignedWelford Roche, MD 03/03/2017, 5:08 AM   Pager: [phone]

## 2017-03-01 NOTE — Progress Notes (Signed)
Pt ambulated in hallway. Oxygen saturation WNL on room air.

## 2017-03-01 NOTE — Progress Notes (Addendum)
Benefit check in progress for Leonard [name] [phone] Xarelto coupon card given to patient with explanation of usage; PCP is Dr Lyda Jester; private insurance with [facility] and pharmacy of choice is [facility]; his girlfriend is to transport him home at discharge via private vehicle. Judie Petit CMA        # 4. Jamelle Haring  [facility]  @ [place] # [phone]   1.XARELTO  15 MG BID  COVER- YES  CO-PAY- $ 8.50  TIER- 3 DRUG  PRIOR APPROVAL- NO   2. XARELTO 20 MG DAILY  COVER- YES  CO-PAY- $ 8.50  TIER- 3 DRUG  PRIOR APPROVAL- NO   PHARMACY : [facility] / [facility]

## 2017-03-02 ENCOUNTER — Ambulatory Visit: Payer: Medicare Other

## 2017-03-02 NOTE — Telephone Encounter (Signed)
Remains inpt as of today

## 2017-03-04 NOTE — Telephone Encounter (Signed)
No answer, no vmail 

## 2017-03-08 NOTE — Telephone Encounter (Signed)
Pt has appt on 03/10/2017 with [facility].Regenia Skeeter, Darlene Cassady1/29/201911:27 AM

## 2017-03-10 ENCOUNTER — Ambulatory Visit: Payer: Medicare Other | Admitting: Family Medicine

## 2017-03-21 ENCOUNTER — Encounter: Payer: Self-pay | Admitting: Family Medicine

## 2017-03-21 ENCOUNTER — Ambulatory Visit (INDEPENDENT_AMBULATORY_CARE_PROVIDER_SITE_OTHER): Payer: Medicare Other | Admitting: Family Medicine

## 2017-03-21 VITALS — BP 110/60 | HR 89 | Temp 97.9°F | Ht 67.25 in | Wt 179.6 lb

## 2017-03-21 DIAGNOSIS — F17210 Nicotine dependence, cigarettes, uncomplicated: Secondary | ICD-10-CM | POA: Diagnosis not present

## 2017-03-21 DIAGNOSIS — J4530 Mild persistent asthma, uncomplicated: Secondary | ICD-10-CM

## 2017-03-21 DIAGNOSIS — Z7689 Persons encountering health services in other specified circumstances: Secondary | ICD-10-CM

## 2017-03-21 DIAGNOSIS — I5022 Chronic systolic (congestive) heart failure: Secondary | ICD-10-CM | POA: Diagnosis not present

## 2017-03-21 DIAGNOSIS — I10 Essential (primary) hypertension: Secondary | ICD-10-CM | POA: Diagnosis not present

## 2017-03-21 DIAGNOSIS — I2699 Other pulmonary embolism without acute cor pulmonale: Secondary | ICD-10-CM

## 2017-03-21 MED ORDER — ALBUTEROL SULFATE HFA 108 (90 BASE) MCG/ACT IN AERS: 2.0000 | INHALATION_SPRAY | RESPIRATORY_TRACT | 5 refills | Status: DC | PRN

## 2017-03-21 MED ORDER — NICOTINE POLACRILEX 4 MG MT GUM: 4.0000 mg | CHEWING_GUM | OROMUCOSAL | 6 refills | Status: DC | PRN

## 2017-03-21 NOTE — Patient Instructions (Addendum)
Asthma, Adult Asthma is a recurring condition in which the airways tighten and narrow. Asthma can make it difficult to breathe. It can cause coughing, wheezing, and shortness of breath. Asthma episodes, also called asthma attacks, range from minor to life-threatening. Asthma cannot be cured, but medicines and lifestyle changes can help control it. What are the causes? Asthma is believed to be caused by inherited (genetic) and environmental factors, but its exact cause is unknown. Asthma may be triggered by allergens, lung infections, or irritants in the air. Asthma triggers are different for each person. Common triggers include:  Animal dander.  Dust mites.  Cockroaches.  Pollen from trees or grass.  Mold.  Smoke.  Air pollutants such as dust, household cleaners, hair sprays, aerosol sprays, paint fumes, strong chemicals, or strong odors.  Cold air, weather changes, and winds (which increase molds and pollens in the air).  Strong emotional expressions such as crying or laughing hard.  Stress.  Certain medicines (such as aspirin) or types of drugs (such as beta-blockers).  Sulfites in foods and drinks. Foods and drinks that may contain sulfites include dried fruit, potato chips, and sparkling grape juice.  Infections or inflammatory conditions such as the flu, a cold, or an inflammation of the nasal membranes (rhinitis).  Gastroesophageal reflux disease (GERD).  Exercise or strenuous activity.  What are the signs or symptoms? Symptoms may occur immediately after asthma is triggered or many hours later. Symptoms include:  Wheezing.  Excessive nighttime or early morning coughing.  Frequent or severe coughing with a common cold.  Chest tightness.  Shortness of breath.  How is this diagnosed? The diagnosis of asthma is made by a review of your medical history and a physical exam. Tests may also be performed. These may include:  Lung function studies. These tests show how  much air you breathe in and out.  Allergy tests.  Imaging tests such as X-rays.  How is this treated? Asthma cannot be cured, but it can usually be controlled. Treatment involves identifying and avoiding your asthma triggers. It also involves medicines. There are 2 classes of medicine used for asthma treatment:  Controller medicines. These prevent asthma symptoms from occurring. They are usually taken every day.  Reliever or rescue medicines. These quickly relieve asthma symptoms. They are used as needed and provide short-term relief.  Your health care provider will help you create an asthma action plan. An asthma action plan is a written plan for managing and treating your asthma attacks. It includes a list of your asthma triggers and how they may be avoided. It also includes information on when medicines should be taken and when their dosage should be changed. An action plan may also involve the use of a device called a peak flow meter. A peak flow meter measures how well the lungs are working. It helps you monitor your condition. Follow these instructions at home:  Take medicines only as directed by your health care provider. Speak with your health care provider if you have questions about how or when to take the medicines.  Use a peak flow meter as directed by your health care provider. Record and keep track of readings.  Understand and use the action plan to help minimize or stop an asthma attack without needing to seek medical care.  Control your home environment in the following ways to help prevent asthma attacks: ? Do not smoke. Avoid being exposed to secondhand smoke. ? Change your heating and air conditioning filter regularly. ? Limit   your use of fireplaces and wood stoves. ? Get rid of pests (such as roaches and mice) and their droppings. ? Throw away plants if you see mold on them. ? Clean your floors and dust regularly. Use unscented cleaning products. ? Try to have someone  else vacuum for you regularly. Stay out of rooms while they are being vacuumed and for a short while afterward. If you vacuum, use a dust mask from a hardware store, a double-layered or microfilter vacuum cleaner bag, or a vacuum cleaner with a HEPA filter. ? Replace carpet with wood, tile, or vinyl flooring. Carpet can trap dander and dust. ? Use allergy-proof pillows, mattress covers, and box spring covers. ? Wash bed sheets and blankets every week in hot water and dry them in a dryer. ? Use blankets that are made of polyester or cotton. ? Clean bathrooms and kitchens with bleach. If possible, have someone repaint the walls in these rooms with mold-resistant paint. Keep out of the rooms that are being cleaned and painted. ? Wash hands frequently. Contact a health care provider if:  You have wheezing, shortness of breath, or a cough even if taking medicine to prevent attacks.  The colored mucus you cough up (sputum) is thicker than usual.  Your sputum changes from clear or white to yellow, green, gray, or bloody.  You have any problems that may be related to the medicines you are taking (such as a rash, itching, swelling, or trouble breathing).  You are using a reliever medicine more than 2-3 times per week.  Your peak flow is still at 50-79% of your personal best after following your action plan for 1 hour.  You have a fever. Get help right away if:  You seem to be getting worse and are unresponsive to treatment during an asthma attack.  You are short of breath even at rest.  You get short of breath when doing very little physical activity.  You have difficulty eating, drinking, or talking due to asthma symptoms.  You develop chest pain.  You develop a fast heartbeat.  You have a bluish color to your lips or fingernails.  You are light-headed, dizzy, or faint.  Your peak flow is less than 50% of your personal best. This information is not intended to replace advice given to  you by your health care provider. Make sure you discuss any questions you have with your health care provider. Document Released: 01/25/2005 Document Revised: 07/09/2015 Document Reviewed: 08/24/2012 Elsevier Interactive Patient Education  2017 [place] with Quitting Smoking Quitting smoking is a physical and mental challenge. You will face cravings, withdrawal symptoms, and temptation. Before quitting, work with your health care provider to make a plan that can help you cope. Preparation can help you quit and keep you from giving in. How can I cope with cravings? Cravings usually last for 5-10 minutes. If you get through it, the craving will pass. Consider taking the following actions to help you cope with cravings:  Keep your mouth busy: ? Chew sugar-free gum. ? Suck on hard candies or a straw. ? Brush your teeth.  Keep your hands and body busy: ? Immediately change to a different activity when you feel a craving. ? Squeeze or play with a ball. ? Do an activity or a hobby, like making bead jewelry, practicing needlepoint, or working with wood. ? Mix up your normal routine. ? Take a short exercise break. Go for a quick walk or run up and down  stairs. ? Spend time in public places where smoking is not allowed.  Focus on doing something kind or helpful for someone else.  Call a friend or family member to talk during a craving.  Join a support group.  Call a quit line, such as 1-800-QUIT-NOW.  Talk with your health care provider about medicines that might help you cope with cravings and make quitting easier for you.  How can I deal with withdrawal symptoms? Your body may experience negative effects as it tries to get used to not having nicotine in the system. These effects are called withdrawal symptoms. They may include:  Feeling hungrier than normal.  Trouble concentrating.  Irritability.  Trouble sleeping.  Feeling depressed.  Restlessness and  agitation.  Craving a cigarette.  To manage withdrawal symptoms:  Avoid places, people, and activities that trigger your cravings.  Remember why you want to quit.  Get plenty of sleep.  Avoid coffee and other caffeinated drinks. These may worsen some of your symptoms.  How can I handle social situations? Social situations can be difficult when you are quitting smoking, especially in the first few weeks. To manage this, you can:  Avoid parties, bars, and other social situations where people might be smoking.  Avoid alcohol.  Leave right away if you have the urge to smoke.  Explain to your family and friends that you are quitting smoking. Ask for understanding and support.  Plan activities with friends or family where smoking is not an option.  What are some ways I can cope with stress? Wanting to smoke may cause stress, and stress can make you want to smoke. Find ways to manage your stress. Relaxation techniques can help. For example:  Breathe slowly and deeply, in through your nose and out through your mouth.  Listen to soothing, relaxing music.  Talk with a family member or friend about your stress.  Light a candle.  Soak in a bath or take a shower.  Think about a peaceful place.  What are some ways I can prevent weight gain? Be aware that many people gain weight after they quit smoking. However, not everyone does. To keep from gaining weight, have a plan in place before you quit and stick to the plan after you quit. Your plan should include:  Having healthy snacks. When you have a craving, it may help to: ? Eat plain popcorn, crunchy carrots, celery, or other cut vegetables. ? Chew sugar-free gum.  Changing how you eat: ? Eat small portion sizes at meals. ? Eat 4-6 small meals throughout the day instead of 1-2 large meals a day. ? Be mindful when you eat. Do not watch television or do other things that might distract you as you eat.  Exercising  regularly: ? Make time to exercise each day. If you do not have time for a long workout, do short bouts of exercise for 5-10 minutes several times a day. ? Do some form of strengthening exercise, like weight lifting, and some form of aerobic exercise, like running or swimming.  Drinking plenty of water or other low-calorie or no-calorie drinks. Drink 6-8 glasses of water daily, or as much as instructed by your health care provider.  Summary  Quitting smoking is a physical and mental challenge. You will face cravings, withdrawal symptoms, and temptation to smoke again. Preparation can help you as you go through these challenges.  You can cope with cravings by keeping your mouth busy (such as by chewing gum), keeping your body  and hands busy, and making calls to family, friends, or a helpline for people who want to quit smoking.  You can cope with withdrawal symptoms by avoiding places where people smoke, avoiding drinks with caffeine, and getting plenty of rest.  Ask your health care provider about the different ways to prevent weight gain, avoid stress, and handle social situations. This information is not intended to replace advice given to you by your health care provider. Make sure you discuss any questions you have with your health care provider. Document Released: 01/23/2016 Document Revised: 01/23/2016 Document Reviewed: 01/23/2016 Elsevier Interactive Patient Education  2018 [place].  Heart Failure Heart failure is a condition in which the heart has trouble pumping blood because it has become weak or stiff. This means that the heart does not pump blood efficiently for the body to work well. For some people with heart failure, fluid may back up into the lungs and there may be swelling (edema) in the lower legs. Heart failure is usually a long-term (chronic) condition. It is important for you to take good care of yourself and follow the treatment plan from your health care  provider. What are the causes? This condition is caused by some health problems, including:  High blood pressure (hypertension). Hypertension causes the heart muscle to work harder than normal. High blood pressure eventually causes the heart to become stiff and weak.  Coronary artery disease (CAD). CAD is the buildup of cholesterol and fat (plaques) in the arteries of the heart.  Heart attack (myocardial infarction). Injured tissue, which is caused by the heart attack, does not contract as well and the heart's ability to pump blood is weakened.  Abnormal heart valves. When the heart valves do not open and close properly, the heart muscle must pump harder to keep the blood flowing.  Heart muscle disease (cardiomyopathy or myocarditis). Heart muscle disease is damage to the heart muscle from a variety of causes, such as drug or alcohol abuse, infections, or unknown causes. These can increase the risk of heart failure.  Lung disease. When the lungs do not work properly, the heart must work harder.  What increases the risk? Risk of heart failure increases as a person ages. This condition is also more likely to develop in people who:  Are overweight.  Are male.  Smoke or chew tobacco.  Abuse alcohol or illegal drugs.  Have taken medicines that can damage the heart, such as chemotherapy drugs.  Have diabetes. ? High blood sugar (glucose) is associated with high fat (lipid) levels in the blood. ? Diabetes can also damage tiny blood vessels that carry nutrients to the heart muscle.  Have abnormal heart rhythms.  Have thyroid problems.  Have low blood counts (anemia).  What are the signs or symptoms? Symptoms of this condition include:  Shortness of breath with activity, such as when climbing stairs.  Persistent cough.  Swelling of the feet, ankles, legs, or abdomen.  Unexplained weight gain.  Difficulty breathing when lying flat (orthopnea).  Waking from sleep because of  the need to sit up and get more air.  Rapid heartbeat.  Fatigue and loss of energy.  Feeling light-headed, dizzy, or close to fainting.  Loss of appetite.  Nausea.  Increased urination during the night (nocturia).  Confusion.  How is this diagnosed? This condition is diagnosed based on:  Medical history, symptoms, and a physical exam.  Diagnostic tests, which may include: ? Echocardiogram. ? Electrocardiogram (ECG). ? Chest X-ray. ? Blood tests. ?  Exercise stress test. ? Radionuclide scans. ? Cardiac catheterization and angiogram.  How is this treated? Treatment for this condition is aimed at managing the symptoms of heart failure. Medicines, behavioral changes, or other treatments may be necessary to treat heart failure. Medicines These may include:  Angiotensin-converting enzyme (ACE) inhibitors. This type of medicine blocks the effects of a blood protein called angiotensin-converting enzyme. ACE inhibitors relax (dilate) the blood vessels and help to lower blood pressure.  Angiotensin receptor blockers (ARBs). This type of medicine blocks the actions of a blood protein called angiotensin. ARBs dilate the blood vessels and help to lower blood pressure.  Water pills (diuretics). Diuretics cause the kidneys to remove salt and water from the blood. The extra fluid is removed through urination, leaving a lower volume of blood that the heart has to pump.  Beta blockers. These improve heart muscle strength and they prevent the heart from beating too quickly.  Digoxin. This increases the force of the heartbeat.  Healthy behavior changes These may include:  Reaching and maintaining a healthy weight.  Stopping smoking or chewing tobacco.  Eating heart-healthy foods.  Limiting or avoiding alcohol.  Stopping use of street drugs (illegal drugs).  Physical activity.  Other treatments These may include:  Surgery to open blocked coronary arteries or repair damaged  heart valves.  Placement of a biventricular pacemaker to improve heart muscle function (cardiac resynchronization therapy). This device paces both the right ventricle and left ventricle.  Placement of a device to treat serious abnormal heart rhythms (implantable cardioverter defibrillator, or ICD).  Placement of a device to improve the pumping ability of the heart (left ventricular assist device, or LVAD).  Heart transplant. This can cure heart failure, and it is considered for certain patients who do not improve with other therapies.  Follow these instructions at home: Medicines  Take over-the-counter and prescription medicines only as told by your health care provider. Medicines are important in reducing the workload of your heart, slowing the progression of heart failure, and improving your symptoms. ? Do not stop taking your medicine unless your health care provider told you to do that. ? Do not skip any dose of medicine. ? Refill your prescriptions before you run out of medicine. You need your medicines every day. Eating and drinking   Eat heart-healthy foods. Talk with a dietitian to make an eating plan that is right for you. ? Choose foods that contain no trans fat and are low in saturated fat and cholesterol. Healthy choices include fresh or frozen fruits and vegetables, fish, lean meats, legumes, fat-free or low-fat dairy products, and whole-grain or high-fiber foods. ? Limit salt (sodium) if directed by your health care provider. Sodium restriction may reduce symptoms of heart failure. Ask a dietitian to recommend heart-healthy seasonings. ? Use healthy cooking methods instead of frying. Healthy methods include roasting, grilling, broiling, baking, poaching, steaming, and stir-frying.  Limit your fluid intake if directed by your health care provider. Fluid restriction may reduce symptoms of heart failure. Lifestyle  Stop smoking or using chewing tobacco. Nicotine and tobacco can  damage your heart and your blood vessels. Do not use nicotine gum or patches before talking to your health care provider.  Limit alcohol intake to no more than 1 drink per day for non-pregnant women and 2 drinks per day for men. One drink equals 12 oz of beer, 5 oz of wine, or 1 oz of hard liquor. ? Drinking more than that is harmful to your heart. Tell  your health care provider if you drink alcohol several times a week. ? Talk with your health care provider about whether any level of alcohol use is safe for you. ? If your heart has already been damaged by alcohol or you have severe heart failure, drinking alcohol should be stopped completely.  Stop use of illegal drugs.  Lose weight if directed by your health care provider. Weight loss may reduce symptoms of heart failure.  Do moderate physical activity if directed by your health care provider. People who are elderly and people with severe heart failure should consult with a health care provider for physical activity recommendations. Monitor important information  Weigh yourself every day. Keeping track of your weight daily helps you to notice excess fluid sooner. ? Weigh yourself every morning after you urinate and before you eat breakfast. ? Wear the same amount of clothing each time you weigh yourself. ? Record your daily weight. Provide your health care provider with your weight record.  Monitor and record your blood pressure as told by your health care provider.  Check your pulse as told by your health care provider. Dealing with extreme temperatures  If the weather is extremely hot: ? Avoid vigorous physical activity. ? Use air conditioning or fans or seek a cooler location. ? Avoid caffeine and alcohol. ? Wear loose-fitting, lightweight, and light-colored clothing.  If the weather is extremely cold: ? Avoid vigorous physical activity. ? Layer your clothes. ? Wear mittens or gloves, a hat, and a scarf when you go  outside. ? Avoid alcohol. General instructions  Manage other health conditions such as hypertension, diabetes, thyroid disease, or abnormal heart rhythms as told by your health care provider.  Learn to manage stress. If you need help to do this, ask your health care provider.  Plan rest periods when fatigued.  Get ongoing education and support as needed.  Participate in or seek rehabilitation as needed to maintain or improve independence and quality of life.  Stay up to date with immunizations. Keeping current on pneumococcal and influenza immunizations is especially important to prevent respiratory infections.  Keep all follow-up visits as told by your health care provider. This is important. Contact a health care provider if:  You have a rapid weight gain.  You have increasing shortness of breath that is unusual for you.  You are unable to participate in your usual physical activities.  You tire easily.  You cough more than normal, especially with physical activity.  You have any swelling or more swelling in areas such as your hands, feet, ankles, or abdomen.  You are unable to sleep because it is hard to breathe.  You feel like your heart is beating quickly (palpitations).  You become dizzy or light-headed when you stand up. Get help right away if:  You have difficulty breathing.  You notice or your family notices a change in your awareness, such as having trouble staying awake or having difficulty with concentration.  You have pain or discomfort in your chest.  You have an episode of fainting (syncope). This information is not intended to replace advice given to you by your health care provider. Make sure you discuss any questions you have with your health care provider. Document Released: 01/25/2005 Document Revised: 09/30/2015 Document Reviewed: 08/20/2015 Elsevier Interactive Patient Education  2018 [employer].  How to Take Your Blood Pressure You can take  your blood pressure at home with a machine. You may need to check your blood pressure at home:  To check if you have high blood pressure (hypertension).  To check your blood pressure over time.  To make sure your blood pressure medicine is working.  Supplies needed: You will need a blood pressure machine, or monitor. You can buy one at a drugstore or online. When choosing one:  Choose one with an arm cuff.  Choose one that wraps around your upper arm. Only one finger should fit between your arm and the cuff.  Do not choose one that measures your blood pressure from your wrist or finger.  Your doctor can suggest a monitor. How to prepare Avoid these things for 30 minutes before checking your blood pressure:  Drinking caffeine.  Drinking alcohol.  Eating.  Smoking.  Exercising.  Five minutes before checking your blood pressure:  Pee.  Sit in a dining chair. Avoid sitting in a soft couch or armchair.  Be quiet. Do not talk.  How to take your blood pressure Follow the instructions that came with your machine. If you have a digital blood pressure monitor, these may be the instructions: 1. Sit up straight. 2. Place your feet on the floor. Do not cross your ankles or legs. 3. Rest your left arm at the level of your heart. You may rest it on a table, desk, or chair. 4. Pull up your shirt sleeve. 5. Wrap the blood pressure cuff around the upper part of your left arm. The cuff should be 1 inch (2.5 cm) above your elbow. It is best to wrap the cuff around bare skin. 6. Fit the cuff snugly around your arm. You should be able to place only one finger between the cuff and your arm. 7. Put the cord inside the groove of your elbow. 8. Press the power button. 9. Sit quietly while the cuff fills with air and loses air. 10. Write down the numbers on the screen. 11. Wait 2-3 minutes and then repeat steps 1-10.  What do the numbers mean? Two numbers make up your blood pressure. The  first number is called systolic pressure. The second is called diastolic pressure. An example of a blood pressure reading is "120 over 80" (or 120/80). If you are an adult and do not have a medical condition, use this guide to find out if your blood pressure is normal: Normal  First number: below 120.  Second number: below 80. Elevated  First number: 120-129.  Second number: below 80. Hypertension stage 1  First number: 130-139.  Second number: 80-89. Hypertension stage 2  First number: 140 or above.  Second number: 75 or above. Your blood pressure is above normal even if only the top or bottom number is above normal. Follow these instructions at home:  Check your blood pressure as often as your doctor tells you to.  Take your monitor to your next doctor's appointment. Your doctor will: ? Make sure you are using it correctly. ? Make sure it is working right.  Make sure you understand what your blood pressure numbers should be.  Tell your doctor if your medicines are causing side effects. Contact a doctor if:  Your blood pressure keeps being high. Get help right away if:  Your first blood pressure number is higher than 180.  Your second blood pressure number is higher than 120. This information is not intended to replace advice given to you by your health care provider. Make sure you discuss any questions you have with your health care provider. Document Released: 01/08/2008 Document Revised: 12/24/2015 Document Reviewed:  07/04/2015 Elsevier Interactive Patient Education  2018 [facility] With Heart Failure  Heart failure is a long-term (chronic) condition in which the heart cannot pump enough blood through the body. When this happens, parts of the body do not get the blood and oxygen they need. There is no cure for heart failure at this time, so it is important for you to take good care of yourself and follow the treatment plan set by your health care  provider. If you are living with heart failure, there are ways to help you manage the disease. Follow these instructions at home: Living with heart failure requires you to make changes in your life. Your health care team will teach you about the changes you need to make in order to relieve your symptoms and lower your risk of going to the hospital. Follow the treatment plan as set by your health care provider. Medicines Medicines are important in reducing your heart's workload, slowing the progression of heart failure, and improving your symptoms.  Take over-the-counter and prescription medicines only as told by your health care provider.  Do not stop taking your medicine unless your health care provider tells you to do that.  Do not skip any dose of your medicine.  Refill prescriptions before you run out of medicine. You need your medicines every day.  Eating and drinking  Eat heart-healthy foods. Talk with a dietitian to make an eating plan that is right for you. ? If directed by your health care provider: ? Limit salt (sodium). Lowering your sodium intake may reduce symptoms of heart failure. Ask a dietitian to recommend heart-healthy seasonings. ? Limit your fluid intake. Fluid restriction may reduce symptoms of heart failure. ? Use low-fat cooking methods instead of frying. Low-fat methods include roasting, grilling, broiling, baking, poaching, steaming, and stir-frying. ? Choose foods that contain no trans fat and are low in saturated fat and cholesterol. Healthy choices include fresh or frozen fruits and vegetables, fish, lean meats, legumes, fat-free or low-fat dairy products, and whole-grain or high-fiber foods.  Limit alcohol intake to no more than 1 drink a day for nonpregnant women and 2 drinks a day for men. One drink equals 12 oz of beer, 5 oz of wine, or 1 oz of hard liquor. ? Drinking more than that is harmful to your heart. Tell your health care provider if you drink alcohol  several times a week. ? Talk with your health care provider about whether any level of alcohol use is safe for you. Activity  Ask your health care provider about attending cardiac rehabilitation. These programs include aerobic physical activity, which provides many benefits for your heart.  If no cardiac rehabilitation program is available, ask your health care provider what aerobic exercises are safe for you to do. Lifestyle Make the lifestyle changes recommended by your health care provider. In general:  Lose weight if your health care provider tells you to do that. Weight loss may reduce symptoms of heart failure.  Do not use any products that contain nicotine or tobacco, such as cigarettes or e-cigarettes. If you need help quitting, ask your health care provider.  Do not use street (illegal) drugs.  Return to your normal activities as told by your health care provider. Ask your health care provider what activities are safe for you.  General instructions  Make sure you weigh yourself every day to track your weight. Rapid weight gain may indicate an increase in fluid in your body and may increase  the workload of your heart. ? Weigh yourself every morning. Do this after you urinate but before you eat breakfast. ? Wear the same type of clothing, without shoes, each time you weigh yourself. ? Weigh yourself on the same scale and in the same spot each time.  Living with chronic heart failure often leads to emotions such as fear, stress, anxiety, and depression. If you feel any of these emotions and need help coping, contact your health care provider. Other ways to get help include: ? Talking to friends and family members about your condition. They can give you support and guidance. Explain your symptoms to them and, if comfortable, invite them to attend appointments or rehabilitation with you. ? Joining a support group for people with chronic heart failure. Talking with other people who have  the same symptoms may give you new ways of coping with your disease and your emotions.  Stay up to date with your shots (vaccines). Staying current on pneumococcal and influenza vaccines is especially important in preventing germs from attacking your airways (respiratory infections).  Keep all follow-up visits as told by your health care provider. This is important. How to recognize changes in your condition You and your family members need to know what changes to watch for in your condition. Watch for the following changes and report them to your health care provider:  Sudden weight gain. Ask your health care provider what amount of weight gain to report.  Shortness of breath: ? Feeling short of breath while at rest, with no exercise or activity that required great effort. ? Feeling breathless with activity.  Swelling of your lower legs or ankles.  Difficulty sleeping: ? You wake up feeling short of breath. ? You have to use more pillows to raise your head in order to sleep.  Frequent, dry, hacking cough.  Loss of appetite.  Feeling more tired all the time.  Depression or feelings of sadness or hopelessness.  Bloating in the stomach.  Where to find more information  Local support groups. Ask your health care provider about groups near you.  The American Heart Association: www.heart.org Contact a health care provider if:  You have a rapid weight gain.  You have increasing shortness of breath that is unusual for you.  You are unable to participate in your usual physical activities.  You tire easily.  You cough more than normal, especially with physical activity.  You have any swelling or more swelling in areas such as your hands, feet, ankles, or abdomen.  You feel like your heart is beating quickly (palpitations).  You become dizzy or light-headed when you stand up. Get help right away if:  You have difficulty breathing.  You notice or your family notices a  change in your awareness, such as having trouble staying awake or having difficulty with concentration.  You have pain or discomfort in your chest.  You have an episode of fainting (syncope). Summary  There is no cure for heart failure, so it is important for you to take good care of yourself and follow the treatment plan set by your health care provider.  Medicines are important in reducing your heart's workload, slowing the progression of heart failure, and improving your symptoms.  Living with chronic heart failure often leads to emotions such as fear, stress, anxiety, and depression. If you are feeling any of these emotions and need help coping, contact your health care provider. This information is not intended to replace advice given to you by  your health care provider. Make sure you discuss any questions you have with your health care provider. Document Released: 06/09/2016 Document Revised: 06/09/2016 Document Reviewed: 06/09/2016 Elsevier Interactive Patient Education  2018 [employer].  Steps to Quit Smoking Smoking tobacco can be bad for your health. It can also affect almost every organ in your body. Smoking puts you and people around you at risk for many serious long-lasting (chronic) diseases. Quitting smoking is hard, but it is one of the best things that you can do for your health. It is never too late to quit. What are the benefits of quitting smoking? When you quit smoking, you lower your risk for getting serious diseases and conditions. They can include:  Lung cancer or lung disease.  Heart disease.  Stroke.  Heart attack.  Not being able to have children (infertility).  Weak bones (osteoporosis) and broken bones (fractures).  If you have coughing, wheezing, and shortness of breath, those symptoms may get better when you quit. You may also get sick less often. If you are pregnant, quitting smoking can help to lower your chances of having a baby of low birth  weight. What can I do to help me quit smoking? Talk with your doctor about what can help you quit smoking. Some things you can do (strategies) include:  Quitting smoking totally, instead of slowly cutting back how much you smoke over a period of time.  Going to in-person counseling. You are more likely to quit if you go to many counseling sessions.  Using resources and support systems, such as: ? Database administrator with a Social worker. ? Phone quitlines. ? Careers information officer. ? Support groups or group counseling. ? Text messaging programs. ? Mobile phone apps or applications.  Taking medicines. Some of these medicines may have nicotine in them. If you are pregnant or breastfeeding, do not take any medicines to quit smoking unless your doctor says it is okay. Talk with your doctor about counseling or other things that can help you.  Talk with your doctor about using more than one strategy at the same time, such as taking medicines while you are also going to in-person counseling. This can help make quitting easier. What things can I do to make it easier to quit? Quitting smoking might feel very hard at first, but there is a lot that you can do to make it easier. Take these steps:  Talk to your family and friends. Ask them to support and encourage you.  Call phone quitlines, reach out to support groups, or work with a Social worker.  Ask people who smoke to not smoke around you.  Avoid places that make you want (trigger) to smoke, such as: ? Bars. ? Parties. ? Smoke-break areas at work.  Spend time with people who do not smoke.  Lower the stress in your life. Stress can make you want to smoke. Try these things to help your stress: ? Getting regular exercise. ? Deep-breathing exercises. ? Yoga. ? Meditating. ? Doing a body scan. To do this, close your eyes, focus on one area of your body at a time from head to toe, and notice which parts of your body are tense. Try to relax the  muscles in those areas.  Download or buy apps on your mobile phone or tablet that can help you stick to your quit plan. There are many free apps, such as QuitGuide from the [employer] Office manager for Disease Control and Prevention). You can find more support from smokefree.gov and  other websites.  This information is not intended to replace advice given to you by your health care provider. Make sure you discuss any questions you have with your health care provider. Document Released: 11/21/2008 Document Revised: 09/23/2015 Document Reviewed: 06/11/2014 Elsevier Interactive Patient Education  2018 [employer]. Nicotine chewing gum What is this medicine? NICOTINE ([place] teen) helps people stop smoking. This medicine replaces the nicotine found in cigarettes and helps to decrease withdrawal effects. It is most effective when used in combination with a stop-smoking program. This medicine may be used for other purposes; ask your health care provider or pharmacist if you have questions. COMMON BRAND NAME(S): NICOrelief, Nicorette What should I tell my health care provider before I take this medicine? They need to know if you have any of these conditions: -diabetes -heart disease, angina, irregular heartbeat or previous heart attack -high blood pressure -lung disease, including asthma -overactive thyroid -pheochromocytoma -seizures or history of seizures -stomach problems or ulcers -an unusual or allergic reaction to nicotine, other medicines, foods, dyes, or preservatives -pregnant or trying to get pregnant -breast-feeding How should I use this medicine? Chew but do not swallow the gum. Follow the directions that come with the chewing gum. Use exactly as directed. When you feel an urgent desire for a cigarette, chew one piece of gum slowly. Continue chewing until you taste the gum or feel a slight tingling in your mouth. Then, stop chewing and place the gum between your cheek and gum. Wait until the  taste or tingling is almost gone then start chewing again. Continue chewing in this manner for about 30 minutes. Slow chewing helps reduce cravings and also helps reduce the chance for heartburn or other gastrointestinal side effects. Talk to your pediatrician regarding the use of this medicine in children. Special care may be needed. Overdosage: If you think you have taken too much of this medicine contact a poison control center or emergency room at once. NOTE: This medicine is only for you. Do not share this medicine with others. What if I miss a dose? This does not apply. Only use the chewing gum when you have a strong desire to smoke. Do not use more than one piece of gum at a time. What may interact with this medicine? -medicines for asthma -medicines for blood pressure -medicines for mental depression This list may not describe all possible interactions. Give your health care provider a list of all the medicines, herbs, non-prescription drugs, or dietary supplements you use. Also tell them if you smoke, drink alcohol, or use illegal drugs. Some items may interact with your medicine. What should I watch for while using this medicine? Always carry the nicotine gum with you. Do not use more than 30 pieces of gum a day. Too much gum can increase the risk of an overdose. As the urge to smoke gets less, gradually reduce the number of pieces each day over a period of 2 to 3 months. When you are only using 1 or 2 pieces a day, stop using the nicotine gum. You should begin using the nicotine gum the day you stop smoking. It is okay if you do not succeed with the attempt to quit and have a cigarette. You can still continue your quit attempt and keep using the product as directed. Just throw away your cigarettes and get back to your quit plan. If your mouth gets sore from chewing the gum, suck hard sugarless candy between pieces of gum to help relieve the soreness.  Brush your teeth regularly to reduce  mouth irritation. If you wear dentures, contact your doctor or health care professional if the gum sticks to your dental work. If you are a diabetic and you quit smoking, the effects of insulin may be increased and you may need to reduce your insulin dose. Check with your doctor or health care professional about how you should adjust your insulin dose. What side effects may I notice from receiving this medicine? Side effects that you should report to your doctor or health care professional as soon as possible: -allergic reactions like skin rash, itching or hives, swelling of the face, lips, or tongue -blisters in mouth -breathing problems -changes in hearing -changes in vision -chest pain -cold sweats -confusion -fast, irregular heartbeat -feeling faint or lightheaded, falls -headache -increased saliva -nausea, vomiting -stomach pain -weakness Side effects that usually do not require medical attention (report to your doctor or health care professional if they continue or are bothersome): -diarrhea -dry mouth -hiccups -irritability -nervousness or restlessness -trouble sleeping or vivid dreams This list may not describe all possible side effects. Call your doctor for medical advice about side effects. You may report side effects to FDA at 1-800-FDA-1088. Where should I keep my medicine? Keep out of the reach of children. Store at room temperature between 15 and 30 degrees C (59 and 86 degrees F). Protect from heat and light. Throw away unused medicine after the expiration date. NOTE: This sheet is a summary. It may not cover all possible information. If you have questions about this medicine, talk to your doctor, pharmacist, or health care provider.  2018 Elsevier/Gold Standard (2014-07-22 19:37:14)

## 2017-03-21 NOTE — Progress Notes (Signed)
Patient presents to clinic today to f/u on chronic issues and to establish care.  SUBJECTIVE: PMH: Pt is a 67 yo male with pmh sig for HTN, systolic CHF EF 53-64% by estimate on R heart cath 5/18, nicotine dependance, asthma, PE, h/o prostate cancer, mixed HLD.  Pt was formerly seen at [facility].  Pt followed by Dr. Thamas Jaegers.  Pt recently hospitalized 02/24/17 for CHF exacerbation and again on 1/21-1/22/2019 for acute PE.  Pt states he has been feeling ok since d/c.  Pt has not yet f/u with Cardiology.  Pt endorses compliance with his meds including Xarelto.  Pt states he was not told anything about CKD or heart failure such as daily wts or fluid restrictions.  Of note: on CTA chest 1/21, a 4.0 cm  ascending thoracic aortic aneurysm was noted.  Annual imaging f/u by CTA or MRA recommended.  Nicotine dependence: -smoking 10 cig per day x 25-30 yrs  -Emphysema and mod central peribronchial thickening noted on CTA. -pt interested in quitting  Asthma: -d/x'd as a child -mostly experiences symptoms at night including SOB, seldom cough or wheezing -symptoms 2 nights per wk  H/o glaucoma: -followed by Optho -s/p procedure -uses latanoprost 0.005% and combigan 0.2-0.5% ophthalmic soln.  HTN: -pt not checking bp at home. 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 -taking Lasix 40 mg, coreg 25 mg and isordil 10 mg  Allergies: Losartan-rash  P Surg Hx: Prostatectomy for prostate cancer 2015 Appendectomy  Social Hx: Pt is single.  He is retired from having several businesses.  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  Patient endorses cigarette use, 10 cigarettes/day times the last 25-30 years.  Patient also endorses alcohol use.  States he may drink 1/2 gallon of bourbon per week, but notes he shares with friends.   Past Medical History:  Diagnosis Date  . Asthma   . Hypertension     . Prostate cancer [facility])     Past Surgical History:  Procedure Laterality Date  . APPENDECTOMY    . HYDROCELE EXCISION Left 12/01/2015   Procedure: REPAIR OF LEFT HYDROCELE;  Surgeon: Raynelle Bring, MD;  Location: [facility];  Service: Urology;  Laterality: Left;  . LYMPHADENECTOMY Bilateral 09/24/2013   Procedure: LYMPHADENECTOMY;  Surgeon: Raynelle Bring, MD;  Location: [facility];  Service: Urology;  Laterality: Bilateral;  . PROSTATE BIOPSY    . RIGHT/LEFT HEART CATH AND CORONARY ANGIOGRAPHY N/A 06/10/2016   Procedure: Right/Left Heart Cath and Coronary Angiography;  Surgeon: Jettie Booze, MD;  Location: [place] [facility];  Service: Cardiovascular;  Laterality: N/A;  . ROBOT ASSISTED LAPAROSCOPIC RADICAL PROSTATECTOMY N/A 09/24/2013   Procedure: ROBOTIC ASSISTED LAPAROSCOPIC RADICAL PROSTATECTOMY LEVEL 2;  Surgeon: Raynelle Bring, MD;  Location: [facility];  Service: Urology;  Laterality: N/A;    Current Outpatient Medications on File Prior to Visit  Medication Sig Dispense Refill  . albuterol (PROVENTIL) (2.5 MG/3ML) 0.083% nebulizer solution Take 3 mLs (2.5 mg total) by nebulization every 6 (six) hours as needed for wheezing or shortness of breath. 75 mL 2  . COMBIGAN 0.2-0.5 % ophthalmic solution Place 1 drop into both eyes daily.  1  . furosemide (LASIX) 40 MG tablet Take 1 tablet (40 mg total) by mouth 2 (two) times daily. 60 tablet 0  . isosorbide dinitrate (ISORDIL) 10 MG tablet Take 1 tablet (10 mg  total) by mouth 3 (three) times daily. 90 tablet 0  . latanoprost (XALATAN) 0.005 % ophthalmic solution Place 1 drop into both eyes at bedtime.     . Rivaroxaban 15 & 20 MG TBPK Take as directed on package: Start with one 52m tablet by mouth twice a day with food. On Day 22, switch to one 251mtablet once a day with food. 51 each 0  . rosuvastatin (CRESTOR) 10 MG tablet Take 1 tablet (10 mg total) by mouth daily. 30 tablet 0  . traMADol (ULTRAM) 50 MG tablet Take 1 tablet (50 mg total) by  mouth every 8 (eight) hours as needed. 30 tablet 0  . carvedilol (COREG) 25 MG tablet Take 1 tablet (25 mg total) by mouth 2 (two) times daily. 180 tablet 3   No current facility-administered medications on file prior to visit.     Allergies  Allergen Reactions  . Losartan Rash and Other (See Comments)    Abdominal and leg rashes noted in 12/17    Family History  Problem Relation Age of Onset  . Hypertension Sister   . Cancer Neg Hx     Social History   Socioeconomic History  . Marital status: Single    Spouse name: Not on file  . Number of children: Not on file  . Years of education: Not on file  . Highest education level: Not on file  Social Needs  . Financial resource strain: Not on file  . Food insecurity - worry: Not on file  . Food insecurity - inability: Not on file  . Transportation needs - medical: Not on file  . Transportation needs - non-medical: Not on file  Occupational History  . Not on file  Tobacco Use  . Smoking status: Current Every Day Smoker    Packs/day: 2.00    Years: 30.00    Pack years: 60.00    Types: Cigarettes    Last attempt to quit: 12/24/2015    Years since quitting: 1.2  . Smokeless tobacco: Never Used  Substance and Sexual Activity  . Alcohol use: Yes    Comment: 5 glasses of alcohol per day  . Drug use: No  . Sexual activity: Not Currently  Other Topics Concern  . Not on file  Social History Narrative  . Not on file    ROS General: Denies fever, chills, night sweats, changes in weight, changes in appetite HEENT: Denies headaches, ear pain, changes in vision, rhinorrhea, sore throat CV: Denies CP, palpitations, SOB, orthopnea Pulm: Denies SOB, cough, wheezing GI: Denies abdominal pain, nausea, vomiting, diarrhea, constipation GU: Denies dysuria, hematuria, frequency, vaginal discharge Msk: Denies muscle cramps, joint pains Neuro: Denies weakness, numbness, tingling Skin: Denies rashes, bruising Psych: Denies depression,  anxiety, hallucinations  BP 110/60 (BP Location: Left Arm, Patient Position: Sitting, Cuff Size: Normal)   Pulse 89   Temp 97.9 F (36.6 C) (Oral)   Ht 5' 7.25" (1.708 m)   Wt 179 lb 9.6 oz (81.5 kg)   BMI 27.92 kg/m   Physical Exam Gen. Pleasant, well developed, well-nourished, in NAD HEENT - Gray/AT, PERRL, no scleral icterus, no nasal drainage, pharynx without erythema or exudate. Neck: No JVD, no thyromegaly, no carotid bruits Lungs: no use of accessory muscles, no dullness to percussion, CTAB, no wheezes, rales or rhonchi Cardiovascular: RRR, No r/g/m, no peripheral edema Abdomen: BS present, soft, nontender,nondistended Neuro:  A&Ox3, CN II-XII intact, normal gait Skin:  Warm, dry, intact, no lesions Psych: normal affect, mood appropriate.  Recent Results (from the past 2160 hour(s))  CBC with Differential/Platelet     Status: Abnormal   Collection Time: 02/24/17  3:19 AM  Result Value Ref Range   WBC 19.6 (H) 4.0 - 10.5 K/uL   RBC 4.84 4.22 - 5.81 MIL/uL   Hemoglobin 16.5 13.0 - 17.0 g/dL   HCT 46.9 39.0 - 52.0 %   MCV 96.9 78.0 - 100.0 fL   MCH 34.1 (H) 26.0 - 34.0 pg   MCHC 35.2 30.0 - 36.0 g/dL   RDW 13.4 11.5 - 15.5 %   Platelets 225 150 - 400 K/uL   Neutrophils Relative % 58 %   Lymphocytes Relative 34 %   Monocytes Relative 5 %   Eosinophils Relative 3 %   Basophils Relative 0 %   Neutro Abs 11.3 (H) 1.7 - 7.7 K/uL   Lymphs Abs 6.7 (H) 0.7 - 4.0 K/uL   Monocytes Absolute 1.0 0.1 - 1.0 K/uL   Eosinophils Absolute 0.6 0.0 - 0.7 K/uL   Basophils Absolute 0.0 0.0 - 0.1 K/uL   WBC Morphology ABSOLUTE LYMPHOCYTOSIS   Comprehensive metabolic panel     Status: Abnormal   Collection Time: 02/24/17  3:19 AM  Result Value Ref Range   Sodium 140 135 - 145 mmol/L   Potassium 3.7 3.5 - 5.1 mmol/L   Chloride 106 101 - 111 mmol/L   CO2 23 22 - 32 mmol/L   Glucose, Bld 261 (H) 65 - 99 mg/dL   BUN 16 6 - 20 mg/dL   Creatinine, Ser 1.61 (H) 0.61 - 1.24 mg/dL   Calcium  9.1 8.9 - 10.3 mg/dL   Total Protein 8.0 6.5 - 8.1 g/dL   Albumin 4.0 3.5 - 5.0 g/dL   AST 60 (H) 15 - 41 U/L   ALT 29 17 - 63 U/L   Alkaline Phosphatase 97 38 - 126 U/L   Total Bilirubin 1.2 0.3 - 1.2 mg/dL   GFR calc non Af Amer 43 (L) >60 mL/min   GFR calc Af Amer 49 (L) >60 mL/min    Comment: (NOTE) The eGFR has been calculated using the CKD EPI equation. This calculation has not been validated in all clinical situations. eGFR's persistently <60 mL/min signify possible Chronic Kidney Disease.    Anion gap 11 5 - 15  Pathologist smear review     Status: None   Collection Time: 02/24/17  3:19 AM  Result Value Ref Range   Path Review Reviewed By Violet Baldy, M.D.     Comment: 1.17.19        Absolute lymphocytosis. Suggest immunophenotyping if a new, persistent finding.   Brain natriuretic peptide     Status: Abnormal   Collection Time: 02/24/17  3:20 AM  Result Value Ref Range   B Natriuretic Peptide 1,167.4 (H) 0.0 - 100.0 pg/mL  I-stat troponin, ED     Status: None   Collection Time: 02/24/17  3:37 AM  Result Value Ref Range   Troponin i, poc 0.02 0.00 - 0.08 ng/mL   Comment 3            Comment: Due to the release kinetics of cTnI, a negative result within the first hours of the onset of symptoms does not rule out myocardial infarction with certainty. If myocardial infarction is still suspected, repeat the test at appropriate intervals.   I-Stat Chem 8, ED     Status: Abnormal   Collection Time: 02/24/17  3:39 AM  Result Value Ref Range  Sodium 142 135 - 145 mmol/L   Potassium 3.7 3.5 - 5.1 mmol/L   Chloride 106 101 - 111 mmol/L   BUN 15 6 - 20 mg/dL   Creatinine, Ser 1.60 (H) 0.61 - 1.24 mg/dL   Glucose, Bld 262 (H) 65 - 99 mg/dL   Calcium, Ion 1.20 1.15 - 1.40 mmol/L   TCO2 25 22 - 32 mmol/L   Hemoglobin 17.7 (H) 13.0 - 17.0 g/dL   HCT 52.0 39.0 - 52.0 %  Blood gas, arterial ([facility] ONLY)     Status: Abnormal   Collection Time: 02/24/17  4:45 AM    Result Value Ref Range   FIO2 40.00    Delivery systems BILEVEL POSITIVE AIRWAY PRESSURE    LHR 24 resp/min   Inspiratory PAP 13    Expiratory PAP 5    pH, Arterial 7.360 7.350 - 7.450   pCO2 arterial 42.6 32.0 - 48.0 mmHg   pO2, Arterial 68.0 (L) 83.0 - 108.0 mmHg   Bicarbonate 23.5 20.0 - 28.0 mmol/L   Acid-base deficit 1.5 0.0 - 2.0 mmol/L   O2 Saturation 91.5 %   Patient temperature 98.6    Collection site LEFT RADIAL    Drawn by [id number]    Sample type ARTERIAL    Allens test (pass/fail) PASS PASS  Urinalysis, Routine w reflex microscopic     Status: Abnormal   Collection Time: 02/24/17  6:19 AM  Result Value Ref Range   Color, Urine STRAW (A) YELLOW   APPearance CLEAR CLEAR   Specific Gravity, Urine 1.005 1.005 - 1.030   pH 6.0 5.0 - 8.0   Glucose, UA NEGATIVE NEGATIVE mg/dL   Hgb urine dipstick SMALL (A) NEGATIVE   Bilirubin Urine NEGATIVE NEGATIVE   Ketones, ur NEGATIVE NEGATIVE mg/dL   Protein, ur NEGATIVE NEGATIVE mg/dL   Nitrite NEGATIVE NEGATIVE   Leukocytes, UA NEGATIVE NEGATIVE   RBC / HPF 0-5 0 - 5 RBC/hpf   WBC, UA 0-5 0 - 5 WBC/hpf   Bacteria, UA RARE (A) NONE SEEN   Squamous Epithelial / LPF 0-5 (A) NONE SEEN   Mucus PRESENT   Troponin I     Status: Abnormal   Collection Time: 02/24/17  6:19 AM  Result Value Ref Range   Troponin I 0.11 (HH) <0.03 ng/mL    Comment: CRITICAL RESULT CALLED TO, READ BACK BY AND VERIFIED WITH: Terald Sleeper RN AT [phone] 02/24/17 MULLINS,T   CBG monitoring, ED     Status: Abnormal   Collection Time: 02/24/17  8:01 AM  Result Value Ref Range   Glucose-Capillary 165 (H) 65 - 99 mg/dL  CBG monitoring, ED     Status: Abnormal   Collection Time: 02/24/17 12:12 PM  Result Value Ref Range   Glucose-Capillary 192 (H) 65 - 99 mg/dL  Troponin I     Status: Abnormal   Collection Time: 02/24/17 12:15 PM  Result Value Ref Range   Troponin I 0.18 (HH) <0.03 ng/mL    Comment: CRITICAL VALUE NOTED.  VALUE IS CONSISTENT WITH PREVIOUSLY  REPORTED AND CALLED VALUE.  ECHOCARDIOGRAM COMPLETE     Status: None   Collection Time: 02/24/17  4:40 PM  Result Value Ref Range   BP 125/78 mmHg  CBG monitoring, ED     Status: Abnormal   Collection Time: 02/24/17  6:20 PM  Result Value Ref Range   Glucose-Capillary 221 (H) 65 - 99 mg/dL  Glucose, capillary     Status: Abnormal   Collection  Time: 02/24/17  8:50 PM  Result Value Ref Range   Glucose-Capillary 145 (H) 65 - 99 mg/dL  MRSA PCR Screening     Status: None   Collection Time: 02/24/17  9:02 PM  Result Value Ref Range   MRSA by PCR NEGATIVE NEGATIVE    Comment:        The GeneXpert MRSA Assay (FDA approved for NASAL specimens only), is one component of a comprehensive MRSA colonization surveillance program. It is not intended to diagnose MRSA infection nor to guide or monitor treatment for MRSA infections.   Glucose, capillary     Status: Abnormal   Collection Time: 02/25/17 12:23 AM  Result Value Ref Range   Glucose-Capillary 131 (H) 65 - 99 mg/dL  Glucose, capillary     Status: Abnormal   Collection Time: 02/25/17  6:08 AM  Result Value Ref Range   Glucose-Capillary 114 (H) 65 - 99 mg/dL  Glucose, capillary     Status: None   Collection Time: 02/25/17  7:24 AM  Result Value Ref Range   Glucose-Capillary 96 65 - 99 mg/dL  Basic metabolic panel     Status: Abnormal   Collection Time: 02/25/17  9:28 AM  Result Value Ref Range   Sodium 138 135 - 145 mmol/L   Potassium 3.3 (L) 3.5 - 5.1 mmol/L   Chloride 104 101 - 111 mmol/L   CO2 22 22 - 32 mmol/L   Glucose, Bld 152 (H) 65 - 99 mg/dL   BUN 19 6 - 20 mg/dL   Creatinine, Ser 1.34 (H) 0.61 - 1.24 mg/dL   Calcium 9.2 8.9 - 10.3 mg/dL   GFR calc non Af Amer 53 (L) >60 mL/min   GFR calc Af Amer >60 >60 mL/min    Comment: (NOTE) The eGFR has been calculated using the CKD EPI equation. This calculation has not been validated in all clinical situations. eGFR's persistently <60 mL/min signify possible Chronic  Kidney Disease.    Anion gap 12 5 - 15  Glucose, capillary     Status: Abnormal   Collection Time: 02/25/17 11:25 AM  Result Value Ref Range   Glucose-Capillary 123 (H) 65 - 99 mg/dL  Glucose, capillary     Status: Abnormal   Collection Time: 02/25/17  4:09 PM  Result Value Ref Range   Glucose-Capillary 109 (H) 65 - 99 mg/dL  Glucose, capillary     Status: Abnormal   Collection Time: 02/25/17  7:59 PM  Result Value Ref Range   Glucose-Capillary 156 (H) 65 - 99 mg/dL   Comment 1 Notify RN   Glucose, capillary     Status: None   Collection Time: 02/26/17 12:09 AM  Result Value Ref Range   Glucose-Capillary 83 65 - 99 mg/dL  Glucose, capillary     Status: None   Collection Time: 02/26/17  4:26 AM  Result Value Ref Range   Glucose-Capillary 87 65 - 99 mg/dL  Glucose, capillary     Status: None   Collection Time: 02/26/17  7:57 AM  Result Value Ref Range   Glucose-Capillary 95 65 - 99 mg/dL  Basic metabolic panel     Status: Abnormal   Collection Time: 02/26/17  8:03 AM  Result Value Ref Range   Sodium 136 135 - 145 mmol/L   Potassium 4.2 3.5 - 5.1 mmol/L   Chloride 101 101 - 111 mmol/L   CO2 24 22 - 32 mmol/L   Glucose, Bld 85 65 - 99 mg/dL   BUN  22 (H) 6 - 20 mg/dL   Creatinine, Ser 1.18 0.61 - 1.24 mg/dL   Calcium 9.3 8.9 - 10.3 mg/dL   GFR calc non Af Amer >60 >60 mL/min   GFR calc Af Amer >60 >60 mL/min    Comment: (NOTE) The eGFR has been calculated using the CKD EPI equation. This calculation has not been validated in all clinical situations. eGFR's persistently <60 mL/min signify possible Chronic Kidney Disease.    Anion gap 11 5 - 15  Glucose, capillary     Status: None   Collection Time: 02/26/17 11:50 AM  Result Value Ref Range   Glucose-Capillary 94 65 - 99 mg/dL  CBC     Status: Abnormal   Collection Time: 02/28/17  4:00 AM  Result Value Ref Range   WBC 13.8 (H) 4.0 - 10.5 K/uL   RBC 4.30 4.22 - 5.81 MIL/uL   Hemoglobin 14.4 13.0 - 17.0 g/dL   HCT  41.0 39.0 - 52.0 %   MCV 95.3 78.0 - 100.0 fL   MCH 33.5 26.0 - 34.0 pg   MCHC 35.1 30.0 - 36.0 g/dL   RDW 12.9 11.5 - 15.5 %   Platelets 202 150 - 400 K/uL  Comprehensive metabolic panel     Status: Abnormal   Collection Time: 02/28/17  4:00 AM  Result Value Ref Range   Sodium 135 135 - 145 mmol/L   Potassium 4.4 3.5 - 5.1 mmol/L   Chloride 98 (L) 101 - 111 mmol/L   CO2 26 22 - 32 mmol/L   Glucose, Bld 135 (H) 65 - 99 mg/dL   BUN 19 6 - 20 mg/dL   Creatinine, Ser 1.46 (H) 0.61 - 1.24 mg/dL   Calcium 9.2 8.9 - 10.3 mg/dL   Total Protein 6.9 6.5 - 8.1 g/dL   Albumin 3.7 3.5 - 5.0 g/dL   AST 28 15 - 41 U/L   ALT 30 17 - 63 U/L   Alkaline Phosphatase 68 38 - 126 U/L   Total Bilirubin 1.4 (H) 0.3 - 1.2 mg/dL   GFR calc non Af Amer 48 (L) >60 mL/min   GFR calc Af Amer 56 (L) >60 mL/min    Comment: (NOTE) The eGFR has been calculated using the CKD EPI equation. This calculation has not been validated in all clinical situations. eGFR's persistently <60 mL/min signify possible Chronic Kidney Disease.    Anion gap 11 5 - 15  Brain natriuretic peptide     Status: Abnormal   Collection Time: 02/28/17  4:00 AM  Result Value Ref Range   B Natriuretic Peptide 176.6 (H) 0.0 - 100.0 pg/mL  Urinalysis, Routine w reflex microscopic     Status: Abnormal   Collection Time: 02/28/17  4:06 AM  Result Value Ref Range   Color, Urine YELLOW YELLOW   APPearance CLEAR CLEAR   Specific Gravity, Urine 1.010 1.005 - 1.030   pH 5.0 5.0 - 8.0   Glucose, UA NEGATIVE NEGATIVE mg/dL   Hgb urine dipstick MODERATE (A) NEGATIVE   Bilirubin Urine NEGATIVE NEGATIVE   Ketones, ur NEGATIVE NEGATIVE mg/dL   Protein, ur 100 (A) NEGATIVE mg/dL   Nitrite NEGATIVE NEGATIVE   Leukocytes, UA NEGATIVE NEGATIVE   RBC / HPF 6-30 0 - 5 RBC/hpf   WBC, UA 0-5 0 - 5 WBC/hpf   Bacteria, UA RARE (A) NONE SEEN   Squamous Epithelial / LPF 0-5 (A) NONE SEEN   Mucus PRESENT    Hyaline Casts, UA PRESENT  I-stat troponin,  ED     Status: None   Collection Time: 02/28/17  4:11 AM  Result Value Ref Range   Troponin i, poc 0.02 0.00 - 0.08 ng/mL   Comment 3            Comment: Due to the release kinetics of cTnI, a negative result within the first hours of the onset of symptoms does not rule out myocardial infarction with certainty. If myocardial infarction is still suspected, repeat the test at appropriate intervals.   D-dimer, quantitative (not at [facility])     Status: Abnormal   Collection Time: 02/28/17  7:20 AM  Result Value Ref Range   D-Dimer, Quant 0.89 (H) 0.00 - 0.50 ug/mL-FEU    Comment: (NOTE) At the manufacturer cut-off of 0.50 ug/mL FEU, this assay has been documented to exclude PE with a sensitivity and negative predictive value of 97 to 99%.  At this time, this assay has not been approved by the FDA to exclude DVT/VTE. Results should be correlated with clinical presentation.   I-stat troponin, ED     Status: None   Collection Time: 02/28/17  7:20 AM  Result Value Ref Range   Troponin i, poc 0.01 0.00 - 0.08 ng/mL   Comment 3            Comment: Due to the release kinetics of cTnI, a negative result within the first hours of the onset of symptoms does not rule out myocardial infarction with certainty. If myocardial infarction is still suspected, repeat the test at appropriate intervals.   Basic metabolic panel     Status: Abnormal   Collection Time: 03/01/17  4:54 AM  Result Value Ref Range   Sodium 134 (L) 135 - 145 mmol/L   Potassium 4.0 3.5 - 5.1 mmol/L   Chloride 99 (L) 101 - 111 mmol/L   CO2 22 22 - 32 mmol/L   Glucose, Bld 107 (H) 65 - 99 mg/dL   BUN 19 6 - 20 mg/dL   Creatinine, Ser 1.17 0.61 - 1.24 mg/dL   Calcium 9.2 8.9 - 10.3 mg/dL   GFR calc non Af Amer >60 >60 mL/min   GFR calc Af Amer >60 >60 mL/min    Comment: (NOTE) The eGFR has been calculated using the CKD EPI equation. This calculation has not been validated in all clinical situations. eGFR's persistently  <60 mL/min signify possible Chronic Kidney Disease.    Anion gap 13 5 - 15  CBC     Status: Abnormal   Collection Time: 03/01/17  4:54 AM  Result Value Ref Range   WBC 13.6 (H) 4.0 - 10.5 K/uL   RBC 4.36 4.22 - 5.81 MIL/uL   Hemoglobin 14.5 13.0 - 17.0 g/dL   HCT 41.1 39.0 - 52.0 %   MCV 94.3 78.0 - 100.0 fL   MCH 33.3 26.0 - 34.0 pg   MCHC 35.3 30.0 - 36.0 g/dL   RDW 12.8 11.5 - 15.5 %   Platelets 216 150 - 400 K/uL    Assessment/Plan: Mild persistent asthma without complication  - Plan: albuterol (PROVENTIL HFA;VENTOLIN HFA) 108 (90 Base) MCG/ACT inhaler  Cigarette nicotine dependence without complication  -Smoking cessation counseling greater than 3 minutes, less than 10 minutes -Patient currently smoking 10 cigarettes/day -Patient is interested in quitting.  Would like help. -Discussed various options to help patient quit patient would like to try Nicorette gum. -Patient given handout on quitting smoking - Plan: nicotine polacrilex (NICORETTE STARTER KIT) 4 MG  gum -We will reassess progress at each visit.  Essential hypertension -Controlled -Patient encouraged to check blood pressure at home and keep a log -Lifestyle modifications encouraged -Continue Lasix 40 mg, Isordil 10 mg, carvedilol 25 mg -Patient also encouraged to follow-up with cardiology.  Chronic systolic heart failure ([place]) -Patient encouraged her father to follow-up with cardiology -Patient encouraged to weigh daily at home and keep a track of his weights. -Patient encouraged to reduce sodium intake -Patient encouraged to continue Isordil, Coreg, Lasix -Patient given handout on heart failure.  Other acute pulmonary embolism without acute cor pulmonale ([place]) -Patient encouraged to quit smoking and drinking. -Patient encouraged to continue rivaroxaban daily  Establish care -We reviewed the PMH, PSH, FH, SH, Meds and Allergies. -We provided refills for any medications we will prescribe as needed. -We  addressed current concerns per orders and patient instructions. -We have asked for records for pertinent exams, studies, vaccines and notes from previous providers. -We have advised patient to follow up per instructions below.   Follow-up in 1-2 months  Grier Mitts, MD

## 2017-05-19 ENCOUNTER — Ambulatory Visit: Payer: Medicare Other | Admitting: Family Medicine

## 2017-05-19 DIAGNOSIS — Z0289 Encounter for other administrative examinations: Secondary | ICD-10-CM

## 2017-08-09 ENCOUNTER — Encounter (HOSPITAL_COMMUNITY): Payer: Self-pay | Admitting: Emergency Medicine

## 2017-08-09 ENCOUNTER — Emergency Department (HOSPITAL_COMMUNITY): Payer: Medicare Other

## 2017-08-09 ENCOUNTER — Emergency Department (HOSPITAL_COMMUNITY)
Admission: EM | Admit: 2017-08-09 | Discharge: 2017-08-10 | Disposition: A | Discharge status: Home / Self Care | Payer: Medicare Other | Attending: Emergency Medicine | Admitting: Emergency Medicine

## 2017-08-09 DIAGNOSIS — J441 Chronic obstructive pulmonary disease with (acute) exacerbation: Secondary | ICD-10-CM

## 2017-08-09 DIAGNOSIS — I509 Heart failure, unspecified: Secondary | ICD-10-CM | POA: Diagnosis not present

## 2017-08-09 DIAGNOSIS — N182 Chronic kidney disease, stage 2 (mild): Secondary | ICD-10-CM | POA: Diagnosis not present

## 2017-08-09 DIAGNOSIS — Z79899 Other long term (current) drug therapy: Secondary | ICD-10-CM | POA: Diagnosis not present

## 2017-08-09 DIAGNOSIS — R079 Chest pain, unspecified: Secondary | ICD-10-CM | POA: Diagnosis not present

## 2017-08-09 DIAGNOSIS — F17210 Nicotine dependence, cigarettes, uncomplicated: Secondary | ICD-10-CM | POA: Diagnosis not present

## 2017-08-09 DIAGNOSIS — J449 Chronic obstructive pulmonary disease, unspecified: Secondary | ICD-10-CM | POA: Diagnosis not present

## 2017-08-09 DIAGNOSIS — R05 Cough: Secondary | ICD-10-CM | POA: Diagnosis not present

## 2017-08-09 DIAGNOSIS — I130 Hypertensive heart and chronic kidney disease with heart failure and stage 1 through stage 4 chronic kidney disease, or unspecified chronic kidney disease: Secondary | ICD-10-CM | POA: Diagnosis not present

## 2017-08-09 DIAGNOSIS — R0602 Shortness of breath: Secondary | ICD-10-CM | POA: Diagnosis not present

## 2017-08-09 HISTORY — DX: Heart failure, unspecified: I50.9

## 2017-08-09 LAB — I-STAT TROPONIN, ED: Troponin i, poc: 0.01 ng/mL (ref 0.00–0.08)

## 2017-08-09 LAB — BASIC METABOLIC PANEL
Anion gap: 13 (ref 5–15)
BUN: 13 mg/dL (ref 8–23)
CO2: 25 mmol/L (ref 22–32)
Calcium: 9.2 mg/dL (ref 8.9–10.3)
Chloride: 103 mmol/L (ref 98–111)
Creatinine, Ser: 1.26 mg/dL — ABNORMAL HIGH (ref 0.61–1.24)
GFR calc Af Amer: >60 mL/min (ref >60)
GFR calc non Af Amer: 57 mL/min — ABNORMAL LOW (ref >60)
Glucose, Bld: 98 mg/dL (ref 70–99)
Potassium: 3.2 mmol/L — ABNORMAL LOW (ref 3.5–5.1)
Sodium: 141 mmol/L (ref 135–145)

## 2017-08-09 LAB — CBC
HCT: 43.5 % (ref 39.0–52.0)
Hemoglobin: 14.5 g/dL (ref 13.0–17.0)
MCH: 29.2 pg (ref 26.0–34.0)
MCHC: 33.3 g/dL (ref 30.0–36.0)
MCV: 87.5 fL (ref 78.0–100.0)
Platelets: 220 10*3/uL (ref 150–400)
RBC: 4.97 MIL/uL (ref 4.22–5.81)
RDW: 13.9 % (ref 11.5–15.5)
WBC: 10.6 10*3/uL — ABNORMAL HIGH (ref 4.0–10.5)

## 2017-08-09 NOTE — ED Triage Notes (Signed)
Pt reports SOB with congested cough onset yesterday. Pt states he has a harder time breathing when lying flat. Pt states hx of CHF and took and extra dose of lasix today. Denies CP. Pt also smokes 1 pack of cigarettes per day.

## 2017-08-10 MED ORDER — ALBUTEROL SULFATE HFA 108 (90 BASE) MCG/ACT IN AERS
2.0000 | INHALATION_SPRAY | Freq: Once | RESPIRATORY_TRACT | Status: AC
  Administered 2017-08-10: 2 via RESPIRATORY_TRACT
  Filled 2017-08-10: qty 6.7

## 2017-08-10 MED ORDER — IPRATROPIUM-ALBUTEROL 0.5-2.5 (3) MG/3ML IN SOLN
3.0000 mL | Freq: Once | RESPIRATORY_TRACT | Status: AC
  Administered 2017-08-10: 3 mL via RESPIRATORY_TRACT
  Filled 2017-08-10: qty 3

## 2017-08-10 MED ORDER — DEXAMETHASONE SODIUM PHOSPHATE 4 MG/ML IJ SOLN
10.0000 mg | Freq: Once | INTRAMUSCULAR | Status: AC
  Administered 2017-08-10: 10 mg via INTRAVENOUS
  Filled 2017-08-10: qty 3

## 2017-08-10 MED ORDER — ALBUTEROL SULFATE HFA 108 (90 BASE) MCG/ACT IN AERS: 2.0000 | INHALATION_SPRAY | RESPIRATORY_TRACT | 0 refills | Status: AC | PRN

## 2017-08-10 NOTE — ED Provider Notes (Signed)
[facility] EMERGENCY DEPARTMENT Provider Note   CSN: [id number] Arrival date & time: 08/09/17  2218     History   Chief Complaint Chief Complaint  Patient presents with  . Shortness of Breath    HPI Leonard [name] is a 67 y.o. male.  HPI  This is a 67 year old male with a history of heart failure, asthma, hypertension who presents with shortness of breath.  Patient reports several day history of shortness of breath with cough.  He states that cough is productive.  He denies fevers.  He does report chest pain worse with coughing.  He has noted that he sometimes has more difficulty breathing when laying flat.  No significant lower extremity edema.  He did take an extra dose of Lasix which did not seem to help.  He continues to smoke.  He does not routinely use an inhaler or nebulizer.  He  Past Medical History:  Diagnosis Date  . Asthma   . CHF (congestive heart failure) ([place])   . Hypertension   . Prostate cancer [facility])     Patient Active Problem List   Diagnosis Date Noted  . Pulmonary embolism ([place]) 02/28/2017  . Acute exacerbation of congestive heart failure ([place]) 02/24/2017  . CHF exacerbation ([place]) 02/24/2017  . Hypertensive urgency 02/24/2017  . Pulmonary edema 02/24/2017  . Acute kidney injury superimposed on chronic kidney disease ([place]) 02/24/2017  . Hypertensive heart disease 09/27/2016  . Tobacco abuse 09/27/2016  . Mixed hyperlipidemia 09/27/2016  . History of tobacco abuse 06/25/2016  . Allergic sinusitis 04/17/2016  . Asthma 01/19/2016  . CKD (chronic kidney disease) stage 2, GFR 60-89 ml/min 01/19/2016  . Aspiration pneumonitis ([place])   . Essential hypertension   . Heart failure with reduced ejection fraction ([place]) 01/10/2016  . Malignant neoplasm of prostate ([place]) 07/03/2013    Past Surgical History:  Procedure Laterality Date  . APPENDECTOMY    . HYDROCELE EXCISION Left 12/01/2015   Procedure: REPAIR OF LEFT HYDROCELE;  Surgeon:  Raynelle Bring, MD;  Location: [facility];  Service: Urology;  Laterality: Left;  . LYMPHADENECTOMY Bilateral 09/24/2013   Procedure: LYMPHADENECTOMY;  Surgeon: Raynelle Bring, MD;  Location: [facility];  Service: Urology;  Laterality: Bilateral;  . PROSTATE BIOPSY    . RIGHT/LEFT HEART CATH AND CORONARY ANGIOGRAPHY N/A 06/10/2016   Procedure: Right/Left Heart Cath and Coronary Angiography;  Surgeon: Jettie Booze, MD;  Location: [facility] CV LAB;  Service: Cardiovascular;  Laterality: N/A;  . ROBOT ASSISTED LAPAROSCOPIC RADICAL PROSTATECTOMY N/A 09/24/2013   Procedure: ROBOTIC ASSISTED LAPAROSCOPIC RADICAL PROSTATECTOMY LEVEL 2;  Surgeon: Raynelle Bring, MD;  Location: [facility];  Service: Urology;  Laterality: N/A;        Home Medications    Prior to Admission medications   Medication Sig Start Date End Date Taking? Authorizing Provider  albuterol (PROVENTIL HFA;VENTOLIN HFA) 108 (90 Base) MCG/ACT inhaler Inhale 2 puffs into the lungs every 4 (four) hours as needed for wheezing or shortness of breath. 03/21/17  Yes Billie Ruddy, MD  albuterol (PROVENTIL HFA;VENTOLIN HFA) 108 (90 Base) MCG/ACT inhaler Inhale 2 puffs into the lungs every 4 (four) hours as needed for wheezing or shortness of breath. 08/10/17   Quanell Loughney, Barbette Hair, MD  albuterol (PROVENTIL) (2.5 MG/3ML) 0.083% nebulizer solution Take 3 mLs (2.5 mg total) by nebulization every 6 (six) hours as needed for wheezing or shortness of breath. 01/19/16   Milagros Loll, MD  carvedilol (COREG) 25 MG tablet Take 1 tablet (  25 mg total) by mouth 2 (two) times daily. 09/27/16 08/10/17  Jettie Booze, MD  COMBIGAN 0.2-0.5 % ophthalmic solution Place 1 drop into both eyes daily. 07/15/16   [provider]  furosemide (LASIX) 40 MG tablet Take 1 tablet (40 mg total) by mouth 2 (two) times daily. 02/26/17   Welford Roche, MD  isosorbide dinitrate (ISORDIL) 10 MG tablet Take 1 tablet (10 mg total) by mouth 3 (three) times daily.  02/26/17   Santos-Sanchez, Merlene Morse, MD  latanoprost (XALATAN) 0.005 % ophthalmic solution Place 1 drop into both eyes at bedtime.  03/04/16   [provider]  nicotine polacrilex (NICORETTE STARTER KIT) 4 MG gum Take 1 each (4 mg total) by mouth as needed for smoking cessation. Chew 1 piece every 1-2 hrs, wks 1-6.  Wks 7-9, chew 1 piece every 2-4 hrs 03/21/17   Billie Ruddy, MD  Rivaroxaban 15 & 20 MG TBPK Take as directed on package: Start with one 65m tablet by mouth twice a day with food. On Day 22, switch to one 258mtablet once a day with food. 03/01/17   BlLedell NossMD  rosuvastatin (CRESTOR) 10 MG tablet Take 1 tablet (10 mg total) by mouth daily. 02/26/17 08/10/17  Santos-Sanchez, IdMerlene MorseMD  traMADol (ULTRAM) 50 MG tablet Take 1 tablet (50 mg total) by mouth every 8 (eight) hours as needed. 11/03/16   StLandis MartinsDPM    Family History Family History  Problem Relation Age of Onset  . Hypertension Sister   . Cancer Neg Hx     Social History Social History   Tobacco Use  . Smoking status: Current Every Day Smoker    Packs/day: 2.00    Years: 30.00    Pack years: 60.00    Types: Cigarettes    Last attempt to quit: 12/24/2015    Years since quitting: 1.6  . Smokeless tobacco: Never Used  Substance Use Topics  . Alcohol use: Yes    Comment: 5 glasses of alcohol per day  . Drug use: No    Types: Marijuana     Allergies   Losartan   Review of Systems Review of Systems  Constitutional: Negative for fever.  Respiratory: Positive for cough, shortness of breath and wheezing.   Cardiovascular: Positive for chest pain. Negative for leg swelling.  Gastrointestinal: Negative for abdominal pain, nausea and vomiting.  Genitourinary: Negative for dysuria.  All other systems reviewed and are negative.    Physical Exam Updated Vital Signs BP (!) 144/94   Pulse 78   Temp 98.2 F (36.8 C) (Oral)   Resp (!) 27   Ht 5' 7" (1.702 m)   Wt 77.1 kg (170 lb)   SpO2  100%   BMI 26.63 kg/m   Physical Exam  Constitutional: He is oriented to person, place, and time. He appears well-developed and well-nourished.  HENT:  Head: Normocephalic and atraumatic.  Mouth/Throat: Oropharynx is clear and moist.  Eyes: Pupils are equal, round, and reactive to light.  Neck: Neck supple.  Cardiovascular: Normal rate, regular rhythm and normal heart sounds.  No murmur heard. Pulmonary/Chest: Effort normal. No respiratory distress. He has wheezes.  Fair air movement, wheezing noted in all lung fields  Abdominal: Soft. Bowel sounds are normal. There is no tenderness. There is no rebound.  Musculoskeletal: He exhibits no edema.  Trace bilateral lower extremity edema  Lymphadenopathy:    He has no cervical adenopathy.  Neurological: He is alert and oriented to person, place,  and time.  Skin: Skin is warm and dry.  Psychiatric: He has a normal mood and affect.  Nursing note and vitals reviewed.    ED Treatments / Results  Labs (all labs ordered are listed, but only abnormal results are displayed) Labs Reviewed  BASIC METABOLIC PANEL - Abnormal; Notable for the following components:      Result Value   Potassium 3.2 (*)    Creatinine, Ser 1.26 (*)    GFR calc non Af Amer 57 (*)    All other components within normal limits  CBC - Abnormal; Notable for the following components:   WBC 10.6 (*)    All other components within normal limits  I-STAT TROPONIN, ED    EKG EKG Interpretation  Date/Time:  Tuesday August 09 2017 22:28:18 EDT Ventricular Rate:  98 PR Interval:  142 QRS Duration: 90 QT Interval:  370 QTC Calculation: 472 R Axis:   47 Text Interpretation:  Sinus rhythm with Premature atrial complexes Biatrial enlargement Cannot rule out Anteroseptal infarct , age undetermined T wave abnormality Artifact Abnormal ekg Confirmed by Carmin Muskrat [phone]) on 08/09/2017 10:34:49 PM   Radiology Dg Chest 2 View  Result Date: 08/09/2017 CLINICAL DATA:   Acute shortness of breath and cough for 1 day. EXAM: CHEST - 2 VIEW COMPARISON:  02/28/2017 and prior radiographs FINDINGS: Mild cardiomegaly again noted. There is no evidence of focal airspace disease, pulmonary edema, suspicious pulmonary nodule/mass, pleural effusion, or pneumothorax. No acute bony abnormalities are identified. IMPRESSION: Mild cardiomegaly without evidence of acute cardiopulmonary disease Electronically Signed   By: Margarette Canada M.D.   On: 08/09/2017 22:46    Procedures Procedures (including critical care time)  Medications Ordered in ED Medications  albuterol (PROVENTIL HFA;VENTOLIN HFA) 108 (90 Base) MCG/ACT inhaler 2 puff (has no administration in time range)  dexamethasone (DECADRON) injection 10 mg (10 mg Intravenous Given 08/10/17 0128)  ipratropium-albuterol (DUONEB) 0.5-2.5 (3) MG/3ML nebulizer solution 3 mL (3 mLs Nebulization Given 08/10/17 0126)  ipratropium-albuterol (DUONEB) 0.5-2.5 (3) MG/3ML nebulizer solution 3 mL (3 mLs Nebulization Given 08/10/17 0338)     Initial Impression / Assessment and Plan / ED Course  I have reviewed the triage vital signs and the nursing notes.  Pertinent labs & imaging results that were available during my care of the patient were reviewed by me and considered in my medical decision making (see chart for details).  Clinical Course as of Aug 11 411  Wed Aug 10, 2017  0309 Reports improvement after DuoNeb.  He does continue to have some wheezing on exam.  Will repeat DuoNeb and ambulate with pulse ox.   [CH]    Clinical Course User Index [CH] Horton, Barbette Hair, MD    Patient presents with shortness of breath and cough.  He is overall nontoxic-appearing and in no acute distress.  Vital signs reassuring.  He has a history of CHF.  Also reported history of asthma and continues to smoke.  He is wheezing on exam.  He does not appear volume overloaded.  Favor asthma/COPD over CHF.  EKG is nonischemic.  Lab work including troponin is  negative.  Chest x-ray shows no evidence of pneumothorax or pneumonia.  Patient was given 2 DuoNeb's and reports improvement of his shortness of breath.  He continues to have some wheezing but is able to ambulate with pulse ox greater than 92% without any increasing shortness of breath.  He was given 1 dose of Decadron.  Will discharge home with an inhaler  and primary care follow-up.  After history, exam, and medical workup I feel the patient has been appropriately medically screened and is safe for discharge home. Pertinent diagnoses were discussed with the patient. Patient was given return precautions.   Final Clinical Impressions(s) / ED Diagnoses   Final diagnoses:  COPD exacerbation [facility])    ED Discharge Orders        Ordered    albuterol (PROVENTIL HFA;VENTOLIN HFA) 108 (90 Base) MCG/ACT inhaler  Every 4 hours PRN     08/10/17 0409       Merryl Hacker, MD 08/10/17 [phone]

## 2017-08-10 NOTE — ED Notes (Signed)
Ambulated patient in hallway with pulse oximetry.  Patient had no complaints of dizziness or shortness of breath while ambulating 02 saturation remained at 93% and above.

## 2017-08-10 NOTE — Discharge Instructions (Addendum)
You were seen today for shortness of breath.  This is likely related to your smoking and COPD.  Take and use her albuterol every 4 hours as needed.  You were given a dose of steroids.  Follow-up with your primary physician if not improving.

## 2017-10-17 ENCOUNTER — Telehealth: Payer: Self-pay

## 2017-10-17 DIAGNOSIS — I428 Other cardiomyopathies: Secondary | ICD-10-CM | POA: Insufficient documentation

## 2017-10-17 DIAGNOSIS — Z86711 Personal history of pulmonary embolism: Secondary | ICD-10-CM | POA: Insufficient documentation

## 2017-10-17 MED ORDER — CARVEDILOL 25 MG PO TABS: 25.0000 mg | ORAL_TABLET | Freq: Two times a day (BID) | ORAL | 3 refills | Status: DC

## 2017-10-17 MED ORDER — FUROSEMIDE 40 MG PO TABS: 40.0000 mg | ORAL_TABLET | Freq: Two times a day (BID) | ORAL | 3 refills | Status: DC

## 2017-10-17 MED ORDER — ISOSORBIDE DINITRATE 10 MG PO TABS: 10.0000 mg | ORAL_TABLET | Freq: Three times a day (TID) | ORAL | 3 refills | Status: DC

## 2017-10-17 NOTE — Progress Notes (Signed)
Cardiology Office Note    Date:  10/18/2017   ID:  Leonard [name], DOB May 06, 1950, MRN [id number]  PCP:  Billie Ruddy, MD  Cardiologist: [name], MD  No chief complaint on file.   History of Present Illness:  Leonard [name] is a 67 y.o. male with history of hypertension, hypertensive heart disease, nonischemic cardiomyopathy, tobacco abuse, who had an abnormal echo with EF of 40 to 45% and grade 1 DD followed by cardiac catheterization 06/2016 found to have 40% proximal RCA, 25% distal RCA, 20% mid LAD moderate LV systolic dysfunction ejection fraction 35 to 45% normal pulmonary artery pressures.  Good blood pressure control was stressed.  Patient has had a cough with ACE inhibitors in the past and a rash with losartan.  Follow-up 2D echo 02/2017 showed further decrease in LV function EF now 30 to 35% with diffuse hypokinesis and grade 1 DD, also had moderate AI and mild MR.  Patient had been noncompliant with his medications. CT angiogram 02/28/2017 showed an acute pulmonary embolism in the right middle lobe and mild aneurysmal dilatation of the ascending thoracic aorta measuring 4.0 cm.  We did not see him that hospitalization.  He was sent home on Xarelto for 6 months.  In the emergency room 08/10/2017 with COPD exacerbation.  He walked into the office yesterday complaining of labored breathing and swelling and said he ran out of all his heart medicines.  He requested refills of Lasix Coreg and isosorbide which was given to him but pharmacy didn't have isosorbide. Patient has been out of his medicine for 1-2 months. Became short of breath with laying down, walking. Eats out all the time and gets a lot of salt.  Since he took his Lasix twice yesterday he complains of leg cramps.  Smoking half pack of cigarettes daily.  Past Medical History:  Diagnosis Date  . Asthma   . CHF (congestive heart failure) ([place])   . Hypertension   . Prostate cancer [facility])     Past Surgical History:    Procedure Laterality Date  . APPENDECTOMY    . HYDROCELE EXCISION Left 12/01/2015   Procedure: REPAIR OF LEFT HYDROCELE;  Surgeon: Raynelle Bring, MD;  Location: [facility];  Service: Urology;  Laterality: Left;  . LYMPHADENECTOMY Bilateral 09/24/2013   Procedure: LYMPHADENECTOMY;  Surgeon: Raynelle Bring, MD;  Location: [facility];  Service: Urology;  Laterality: Bilateral;  . PROSTATE BIOPSY    . RIGHT/LEFT HEART CATH AND CORONARY ANGIOGRAPHY N/A 06/10/2016   Procedure: Right/Left Heart Cath and Coronary Angiography;  Surgeon: Jettie Booze, MD;  Location: [place] [facility];  Service: Cardiovascular;  Laterality: N/A;  . ROBOT ASSISTED LAPAROSCOPIC RADICAL PROSTATECTOMY N/A 09/24/2013   Procedure: ROBOTIC ASSISTED LAPAROSCOPIC RADICAL PROSTATECTOMY LEVEL 2;  Surgeon: Raynelle Bring, MD;  Location: [facility];  Service: Urology;  Laterality: N/A;    Current Medications: No outpatient medications have been marked as taking for the 10/18/17 encounter (Office Visit) with Imogene Burn, PA-C.     Allergies:   Losartan   Social History   Socioeconomic History  . Marital status: Single    Spouse name: Not on file  . Number of children: Not on file  . Years of education: Not on file  . Highest education level: Not on file  Occupational History  . Not on file  Social Needs  . Financial resource strain: Not on file  . Food insecurity:    Worry: Not on file    Inability: Not  on file  . Transportation needs:    Medical: Not on file    Non-medical: Not on file  Tobacco Use  . Smoking status: Current Every Day Smoker    Packs/day: 2.00    Years: 30.00    Pack years: 60.00    Types: Cigarettes    Last attempt to quit: 12/24/2015    Years since quitting: 1.8  . Smokeless tobacco: Never Used  Substance and Sexual Activity  . Alcohol use: Yes    Comment: 5 glasses of alcohol per day  . Drug use: No    Types: Marijuana  . Sexual activity: Not Currently  Lifestyle  . Physical activity:     Days per week: Not on file    Minutes per session: Not on file  . Stress: Not on file  Relationships  . Social connections:    Talks on phone: Not on file    Gets together: Not on file    Attends religious service: Not on file    Active member of club or organization: Not on file    Attends meetings of clubs or organizations: Not on file    Relationship status: Not on file  Other Topics Concern  . Not on file  Social History Narrative  . Not on file     Family History:  The patient's family history includes Hypertension in his sister.   ROS:   Please see the history of present illness.    Review of Systems  Constitution: Negative.  HENT: Negative.   Cardiovascular: Positive for dyspnea on exertion.  Respiratory: Positive for sleep disturbances due to breathing.   Endocrine: Negative.   Hematologic/Lymphatic: Negative.   Musculoskeletal: Positive for muscle cramps.  Gastrointestinal: Positive for bloating.  Genitourinary: Negative.   Neurological: Negative.    All other systems reviewed and are negative.   PHYSICAL EXAM:   VS:  BP 120/64   Pulse 85   Ht 5' 7.5" (1.715 m)   Wt 177 lb 6.4 oz (80.5 kg)   SpO2 96%   BMI 27.37 kg/m   Physical Exam  GEN: Well nourished, well developed, in no acute distress  Neck: Slight increase JVD, no carotid bruits, or masses Cardiac:RRR; positive S4, 2/6 systolic murmur the left sternal border Respiratory: Decreased breath sounds with crackles at the bases GI: soft, nontender, nondistended, + BS Ext: without cyanosis, clubbing, or edema Neuro:  Alert and Oriented x 3 Psych: euthymic mood, full affect  Wt Readings from Last 3 Encounters:  10/18/17 177 lb 6.4 oz (80.5 kg)  08/09/17 170 lb (77.1 kg)  03/21/17 179 lb 9.6 oz (81.5 kg)      Studies/Labs Reviewed:   EKG:  EKG is not ordered today.    Recent Labs: 02/28/2017: ALT 30; B Natriuretic Peptide 176.6 08/09/2017: BUN 13; Creatinine, Ser 1.26; Hemoglobin 14.5; Platelets  220; Potassium 3.2; Sodium 141   Lipid Panel    Component Value Date/Time   CHOL 144 08/23/2016 1028   TRIG 66 08/23/2016 1028   HDL 64 08/23/2016 1028   CHOLHDL 2.3 08/23/2016 1028   LDLCALC 67 08/23/2016 1028    Additional studies/ records that were reviewed today include:  CT angiogram 1/21/2019IMPRESSION: 1. Acute pulmonary embolism within the right middle lobe lateral segmental artery. No right heart strain. 2. Mild aneurysmal dilatation of the ascending thoracic aorta, measuring 4.0 cm. Recommend annual imaging followup by CTA or MRA. This recommendation follows 2010  2D echo 1/17/2019Study Conclusions   - Left ventricle: The  cavity size was mildly dilated. There was   mild concentric hypertrophy. Systolic function was moderately to   severely reduced. The estimated ejection fraction was in the   range of 30% to 35%. Diffuse hypokinesis. Doppler parameters are   consistent with abnormal left ventricular relaxation (grade 1   diastolic dysfunction). There was no evidence of elevated   ventricular filling pressure by Doppler parameters. - Aortic valve: There was moderate regurgitation. - Mitral valve: There was mild regurgitation. - Left atrium: The atrium was moderately dilated. - Right ventricle: The cavity size was normal. Wall thickness was   normal. Systolic function was normal. - Right atrium: The atrium was normal in size. - Tricuspid valve: There was trivial regurgitation. - Pulmonary arteries: Systolic pressure was within the normal   range. - Inferior vena cava: The vessel was normal in size. - Pericardium, extracardiac: There was no pericardial effusion.   Impressions:   - Since the last study on 05/06/16 LVEF has further decreased from   40-45% to 30-25% with diffuse hypokinesis.    Cardiac catheterization 5/2018Prox RCA lesion, 40 %stenosed.  Dist RCA lesion, 25 %stenosed.  Ramus lesion, 25 %stenosed.  Mid LAD lesion, 20 %stenosed.  There is  moderate left ventricular systolic dysfunction.  LV end diastolic pressure is mildly elevated.  The left ventricular ejection fraction is 35-45% by visual estimate.  There is no aortic valve stenosis.  Ao sat 93%, PA sat 61%. CO 4.5 L/min. CI 2.2. Normal pulmonary artery pressures.  No AAA. Mild left renal artery stenosis. No right renal artery stenosis.   Nonobstructive coronary artery disease.  Patient with nonischemic cardiomyopathy, likely related to elevated blood pressure.   He needs aggressive BP control.  Increase coreg to 6.25 mg BID.  Will have him f/u in our HTN clinic with electrolytes to be checked.    ASSESSMENT:    1. Essential hypertension   2. Hypertensive heart disease with chronic systolic congestive heart failure ([place])   3. Acute on chronic systolic congestive heart failure ([place])   4. Nonischemic cardiomyopathy ([place])   5. History of pulmonary embolus ([place])   6. Tobacco abuse   7. Mixed hyperlipidemia   8. CKD (chronic kidney disease) stage 2, GFR 60-89 ml/min      PLAN:  In order of problems listed above:  Essential hypertension and hypertensive heart disease with chronic systolic CHF-blood pressure well controlled today on carvedilol and Lasix.  Will not restart Isordil.  Acute on chronic systolic CHF he has a few rales and increased JVD but seems better than he was yesterday.  Continue Lasix 40 mg twice daily for 3 days then 40 mg once daily.  Add potassium 20 mEq twice daily for 3 days then once daily.  Check be met in fasting lipid panel today.  Follow-up with me in 2 weeks.  2 g sodium diet.  Nonischemic cardiomyopathy ejection fraction 30 to 35% on echo 02/2017 which was further decline after he was noncompliant with his medications, nonobstructive CAD on cath 06/2016  History of pulmonary embolus 02/28/2017 treated with Xarelto for 6 months patient states he took it for the full 6 months.  Tobacco abuse smoking cessation discussed.  Hyperlipidemia  resume Crestor.  Patient's been out for several months.  Check fasting lipid panel today.  CKD stage II last creatinine 1.26 08/09/2017      Medication Adjustments/Labs and Tests Ordered: Current medicines are reviewed at length with the patient today.  Concerns regarding medicines are outlined above.  Medication changes, Labs and Tests ordered today are listed in the Patient Instructions below. Patient Instructions   Medication Instructions:  Your physician has recommended you make the following change in your medication:   1. STOP: isosorbide dinitrate (isordil)  2. furosemide (lasix) 40 mg tablet: Take 1 tablet twice a day for 3 days only, then take 1 tablet once a day  3. Potassium 20 mEq tablet: Take 1 tablet twice a day for 3 days only, then take 1 tablet once a day  4. RESTART: rosuvastatin (crestor) 10 mg tablet: Take 1 tablet once a day   Labwork: TODAY: CMET, LIPIDS  Testing/Procedures: None ordered  Follow-Up: Your physician recommends that you follow-up on 11/08/17 at 11:00 AM with Ermalinda Barrios, PA   Any Other Special Instructions Will Be Listed Below (If Applicable).  Two Gram Sodium Diet 2000 mg  What is Sodium? Sodium is a mineral found naturally in many foods. The most significant source of sodium in the diet is table salt, which is about 40% sodium.  Processed, convenience, and preserved foods also contain a large amount of sodium.  The body needs only 500 mg of sodium daily to function,  A normal diet provides more than enough sodium even if you do not use salt.  Why Limit Sodium? A build up of sodium in the body can cause thirst, increased blood pressure, shortness of breath, and water retention.  Decreasing sodium in the diet can reduce edema and risk of heart attack or stroke associated with high blood pressure.  Keep in mind that there are many other factors involved in these health problems.  Heredity, obesity, lack of exercise, cigarette smoking, stress  and what you eat all play a role.  General Guidelines:  Do not add salt at the table or in cooking.  One teaspoon of salt contains over 2 grams of sodium.  Read food labels  Avoid processed and convenience foods  Ask your dietitian before eating any foods not dicussed in the menu planning guidelines  Consult your physician if you wish to use a salt substitute or a sodium containing medication such as antacids.  Limit milk and milk products to 16 oz (2 cups) per day.  Shopping Hints:  READ LABELS!! "Dietetic" does not necessarily mean low sodium.  Salt and other sodium ingredients are often added to foods during processing.   Menu Planning Guidelines Food Group Choose More Often Avoid  Beverages (see also the milk group All fruit juices, low-sodium, salt-free vegetables juices, low-sodium carbonated beverages Regular vegetable or tomato juices, commercially softened water used for drinking or cooking  Breads and Cereals Enriched white, wheat, rye and pumpernickel bread, hard rolls and dinner rolls; muffins, cornbread and waffles; most dry cereals, cooked cereal without added salt; unsalted crackers and breadsticks; low sodium or homemade bread crumbs Bread, rolls and crackers with salted tops; quick breads; instant hot cereals; pancakes; commercial bread stuffing; self-rising flower and biscuit mixes; regular bread crumbs or cracker crumbs  Desserts and Sweets Desserts and sweets mad with mild should be within allowance Instant pudding mixes and cake mixes  Fats Butter or margarine; vegetable oils; unsalted salad dressings, regular salad dressings limited to 1 Tbs; light, sour and heavy cream Regular salad dressings containing bacon fat, bacon bits, and salt pork; snack dips made with instant soup mixes or processed cheese; salted nuts  Fruits Most fresh, frozen and canned fruits Fruits processed with salt or sodium-containing ingredient (some dried fruits are processed with sodium sulfites  Vegetables Fresh, frozen vegetables and low- sodium canned vegetables Regular canned vegetables, sauerkraut, pickled vegetables, and others prepared in brine; frozen vegetables in sauces; vegetables seasoned with ham, bacon or salt pork  Condiments, Sauces, Miscellaneous  Salt substitute with physician's approval; pepper, herbs, spices; vinegar, lemon or lime juice; hot pepper sauce; garlic powder, onion powder, low sodium soy sauce (1 Tbs.); low sodium condiments (ketchup, chili sauce, mustard) in limited amounts (1 tsp.) fresh ground horseradish; unsalted tortilla chips, pretzels, potato chips, popcorn, salsa (1/4 cup) Any seasoning made with salt including garlic salt, celery salt, onion salt, and seasoned salt; sea salt, rock salt, kosher salt; meat tenderizers; monosodium glutamate; mustard, regular soy sauce, barbecue, sauce, chili sauce, teriyaki sauce, steak sauce, Worcestershire sauce, and most flavored vinegars; canned gravy and mixes; regular condiments; salted snack foods, olives, picles, relish, horseradish sauce, catsup   Food preparation: Try these seasonings Meats:    Pork Sage, onion Serve with applesauce  Chicken Poultry seasoning, thyme, parsley Serve with cranberry sauce  Lamb Curry powder, rosemary, garlic, thyme Serve with mint sauce or jelly  Veal Marjoram, basil Serve with current jelly, cranberry sauce  Beef Pepper, bay leaf Serve with dry mustard, unsalted chive butter  Fish [place] leaf, dill Serve with unsalted lemon butter, unsalted parsley butter  Vegetables:    Asparagus Lemon juice   Broccoli Lemon juice   Carrots Mustard dressing parsley, mint, nutmeg, glazed with unsalted butter and sugar   Green beans Marjoram, lemon juice, nutmeg,dill seed   Tomatoes Basil, marjoram, onion   Spice /blend for [employer]" 4 tsp ground thyme 1 tsp ground sage 3 tsp ground rosemary 4 tsp ground marjoram   Test your knowledge 1. A product that says "Salt Free" may  still contain sodium. True or False 2. Garlic Powder and Hot Pepper Sauce an be used as alternative seasonings.True or False 3. Processed foods have more sodium than fresh foods.  True or False 4. Canned Vegetables have less sodium than froze True or False  WAYS TO DECREASE YOUR SODIUM INTAKE 1. Avoid the use of added salt in cooking and at the table.  Table salt (and other prepared seasonings which contain salt) is probably one of the greatest sources of sodium in the diet.  Unsalted foods can gain flavor from the sweet, sour, and butter taste sensations of herbs and spices.  Instead of using salt for seasoning, try the following seasonings with the foods listed.  Remember: how you use them to enhance natural food flavors is limited only by your creativity... Allspice-Meat, fish, eggs, fruit, peas, red and yellow vegetables Almond Extract-Fruit baked goods Anise Seed-Sweet breads, fruit, carrots, beets, cottage cheese, cookies (tastes like licorice) Basil-Meat, fish, eggs, vegetables, rice, vegetables salads, soups, sauces Bay Leaf-Meat, fish, stews, poultry Burnet-Salad, vegetables (cucumber-like flavor) Caraway Seed-Bread, cookies, cottage cheese, meat, vegetables, cheese, rice Cardamon-Baked goods, fruit, soups Celery Powder or seed-Salads, salad dressings, sauces, meatloaf, soup, bread.Do not use  celery salt Chervil-Meats, salads, fish, eggs, vegetables, cottage cheese (parsley-like flavor) Chili Power-Meatloaf, chicken cheese, corn, eggplant, egg dishes Chives-Salads cottage cheese, egg dishes, soups, vegetables, sauces Cilantro-Salsa, casseroles Cinnamon-Baked goods, fruit, pork, lamb, chicken, carrots Cloves-Fruit, baked goods, fish, pot roast, green beans, beets, carrots Coriander-Pastry, cookies, meat, salads, cheese (lemon-orange flavor) Cumin-Meatloaf, fish,cheese, eggs, cabbage,fruit pie (caraway flavor) [employer], fruit, eggs, fish, poultry, cottage cheese,  vegetables Dill Seed-Meat, cottage cheese, poultry, vegetables, fish, salads, bread Fennel Seed-Bread, cookies, apples, pork, eggs, fish, beets, cabbage, cheese, Licorice-like flavor Garlic-(buds or powder)  Salads, meat, poultry, fish, bread, butter, vegetables, potatoes.Do not  use garlic salt Ginger-Fruit, vegetables, baked goods, meat, fish, poultry Horseradish Root-Meet, vegetables, butter Lemon Juice or Extract-Vegetables, fruit, tea, baked goods, fish salads Mace-Baked goods fruit, vegetables, fish, poultry (taste like nutmeg) Maple Extract-Syrups Marjoram-Meat, chicken, fish, vegetables, breads, green salads (taste like Sage) Mint-Tea, lamb, sherbet, vegetables, desserts, carrots, cabbage Mustard, Dry or Seed-Cheese, eggs, meats, vegetables, poultry Nutmeg-Baked goods, fruit, chicken, eggs, vegetables, desserts Onion Powder-Meat, fish, poultry, vegetables, cheese, eggs, bread, rice salads (Do not use   Onion salt) Orange Extract-Desserts, baked goods Oregano-Pasta, eggs, cheese, onions, pork, lamb, fish, chicken, vegetables, green salads Paprika-Meat, fish, poultry, eggs, cheese, vegetables Parsley Flakes-Butter, vegetables, meat fish, poultry, eggs, bread, salads (certain forms may   Contain sodium Pepper-Meat fish, poultry, vegetables, eggs Peppermint Extract-Desserts, baked goods Poppy Seed-Eggs, bread, cheese, fruit dressings, baked goods, noodles, vegetables, cottage  [employer], poultry, meat, fish, cauliflower, turnips,eggs bread Saffron-Rice, bread, veal, chicken, fish, eggs Sage-Meat, fish, poultry, onions, eggplant, tomateos, pork, stews Savory-Eggs, salads, poultry, meat, rice, vegetables, soups, pork Tarragon-Meat, poultry, fish, eggs, butter, vegetables (licorice-like flavor)  Thyme-Meat, poultry, fish, eggs, vegetables, (clover-like flavor), sauces, soups Tumeric-Salads, butter, eggs, fish, rice, vegetables (saffron-like  flavor) Vanilla Extract-Baked goods, candy Vinegar-Salads, vegetables, meat marinades Walnut Extract-baked goods, candy  2. Choose your Foods Wisely   The following is a list of foods to avoid which are high in sodium:  Meats-Avoid all smoked, canned, salt cured, dried and kosher meat and fish as well as Anchovies   [employer] meats:Bologna, Liverwurst, Pastrami Canned meat or fish  Marinated herring Caviar    Pepperoni Corned Beef   Pizza Dried chipped beef  Salami Frozen breaded fish or meat Salt pork Frankfurters or hot dogs  Sardines Gefilte fish   Sausage Ham (boiled ham, Proscuitto Smoked butt    spiced ham)   Spam      TV Dinners Vegetables Canned vegetables (Regular) Relish Canned mushrooms  Sauerkraut Olives    Tomato juice Pickles  Bakery and Dessert Products Canned puddings  Cream pies Cheesecake   Decorated cakes Cookies  Beverages/Juices Tomato juice, regular  Gatorade   V-8 vegetable juice, regular  Breads and Cereals Biscuit mixes   Salted potato chips, corn chips, pretzels Bread stuffing mixes  Salted crackers and rolls Pancake and waffle mixes Self-rising flour  Seasonings Accent    Meat sauces Barbecue sauce  Meat tenderizer Catsup    Monosodium glutamate (MSG) Celery salt   Onion salt Chili sauce   Prepared mustard Garlic salt   Salt, seasoned salt, sea salt Gravy mixes   Soy sauce Horseradish   Steak sauce Ketchup   Tartar sauce Lite salt    Teriyaki sauce Marinade mixes   Worcestershire sauce  Others Baking powder   Cocoa and cocoa mixes Baking soda   Commercial casserole mixes Candy-caramels, chocolate  Dehydrated soups    Bars, fudge,nougats  Instant rice and pasta mixes Canned broth or soup  Maraschino cherries Cheese, aged and processed cheese and cheese spreads  Learning Assessment Quiz  Indicated T (for True) or F (for False) for each of the following statements:  1. _____ Fresh fruits and vegetables and  unprocessed grains are generally low in sodium 2. _____ Water may contain a considerable amount of sodium, depending on the source 3. _____ You can always tell if a food is high in sodium by tasting it 4. _____ Certain laxatives my be high in sodium and  should be avoided unless prescribed   by a physician or pharmacist 5. _____ Salt substitutes may be used freely by anyone on a sodium restricted diet 6. _____ Sodium is present in table salt, food additives and as a natural component of   most foods 7. _____ Table salt is approximately 90% sodium 8. _____ Limiting sodium intake may help prevent excess fluid accumulation in the body 9. _____ On a sodium-restricted diet, seasonings such as bouillon soy sauce, and    cooking wine should be used in place of table salt 10. _____ On an ingredient list, a product which lists monosodium glutamate as the first   ingredient is an appropriate food to include on a low sodium diet  Circle the best answer(s) to the following statements (Hint: there may be more than one correct answer)  11. On a low-sodium diet, some acceptable snack items are:    A. Olives  F. Bean dip   K. Grapefruit juice    B. Salted Pretzels G. Commercial Popcorn   L. Canned peaches    C. Carrot Sticks  H. Bouillon   M. Unsalted nuts   D. [place] fries  I. Peanut butter crackers N. Salami   E. Sweet pickles J. Tomato Juice   O. Pizza  12.  Seasonings that may be used freely on a reduced - sodium diet include   A. Lemon wedges F.Monosodium glutamate K. Celery seed    B.Soysauce   G. Pepper   L. Mustard powder   C. Sea salt  H. Cooking wine  M. Onion flakes   D. Vinegar  E. Prepared horseradish N. Salsa   E. Sage   J. Worcestershire sauce  O. Chutney   Steps to Quit Smoking Smoking tobacco can be bad for your health. It can also affect almost every organ in your body. Smoking puts you and people around you at risk for many serious long-lasting (chronic) diseases. Quitting  smoking is hard, but it is one of the best things that you can do for your health. It is never too late to quit. What are the benefits of quitting smoking? When you quit smoking, you lower your risk for getting serious diseases and conditions. They can include:  Lung cancer or lung disease.  Heart disease.  Stroke.  Heart attack.  Not being able to have children (infertility).  Weak bones (osteoporosis) and broken bones (fractures).  If you have coughing, wheezing, and shortness of breath, those symptoms may get better when you quit. You may also get sick less often. If you are pregnant, quitting smoking can help to lower your chances of having a baby of low birth weight. What can I do to help me quit smoking? Talk with your doctor about what can help you quit smoking. Some things you can do (strategies) include:  Quitting smoking totally, instead of slowly cutting back how much you smoke over a period of time.  Going to in-person counseling. You are more likely to quit if you go to many counseling sessions.  Using resources and support systems, such as: ? Database administrator with a Social worker. ? Phone quitlines. ? Careers information officer. ? Support groups or group counseling. ? Text messaging programs. ? Mobile phone apps or applications.  Taking medicines. Some of these medicines may have nicotine in them. If you are pregnant or breastfeeding, do not take any medicines to quit smoking unless your doctor says it is okay. Talk with your doctor about counseling or other things  that can help you.  Talk with your doctor about using more than one strategy at the same time, such as taking medicines while you are also going to in-person counseling. This can help make quitting easier. What things can I do to make it easier to quit? Quitting smoking might feel very hard at first, but there is a lot that you can do to make it easier. Take these steps:  Talk to your family and friends. Ask  them to support and encourage you.  Call phone quitlines, reach out to support groups, or work with a Social worker.  Ask people who smoke to not smoke around you.  Avoid places that make you want (trigger) to smoke, such as: ? Bars. ? Parties. ? Smoke-break areas at work.  Spend time with people who do not smoke.  Lower the stress in your life. Stress can make you want to smoke. Try these things to help your stress: ? Getting regular exercise. ? Deep-breathing exercises. ? Yoga. ? Meditating. ? Doing a body scan. To do this, close your eyes, focus on one area of your body at a time from head to toe, and notice which parts of your body are tense. Try to relax the muscles in those areas.  Download or buy apps on your mobile phone or tablet that can help you stick to your quit plan. There are many free apps, such as QuitGuide from the [employer] Office manager for Disease Control and Prevention). You can find more support from smokefree.gov and other websites.  This information is not intended to replace advice given to you by your health care provider. Make sure you discuss any questions you have with your health care provider. Document Released: 11/21/2008 Document Revised: 09/23/2015 Document Reviewed: 06/11/2014 Elsevier Interactive Patient Education  [employer].   If you need a refill on your cardiac medications before your next appointment, please call your pharmacy.      Sumner Boast, PA-C  10/18/2017 8:36 AM    [facility] [address], [place], [place]  [address] Phone: [phone]; Fax: [phone]

## 2017-10-17 NOTE — Telephone Encounter (Signed)
Pt walked in today with c/o increased SOB over the weekend. He was not able to tell me if he has gained weight, but he states his feet and abdomen are swollen. Upon visual assessment, pt did not have labored breathing and had no difficulty ambulating. His abdomen did appear distended. He states he is out of "all my heart pills" and is requesting refills of lasix, coreg and isosorbide. I have advised pt to begin his lasix as soon as he picked it up from the pharmacy. Pt has been scheduled to see Margarite Gouge tomorrow 9/10 @ 0800 for further evaluation and medication management. Pt has been educated if he had increased SOB, he should seek further assistance from the ED.

## 2017-10-18 ENCOUNTER — Encounter: Payer: Self-pay | Admitting: Physician Assistant

## 2017-10-18 ENCOUNTER — Ambulatory Visit (INDEPENDENT_AMBULATORY_CARE_PROVIDER_SITE_OTHER): Payer: Medicare Other | Admitting: Physician Assistant

## 2017-10-18 VITALS — BP 120/64 | HR 85 | Ht 67.5 in | Wt 177.4 lb

## 2017-10-18 DIAGNOSIS — I110 Hypertensive heart disease with heart failure: Secondary | ICD-10-CM

## 2017-10-18 DIAGNOSIS — I428 Other cardiomyopathies: Secondary | ICD-10-CM

## 2017-10-18 DIAGNOSIS — Z86711 Personal history of pulmonary embolism: Secondary | ICD-10-CM

## 2017-10-18 DIAGNOSIS — Z720 Tobacco use: Secondary | ICD-10-CM

## 2017-10-18 DIAGNOSIS — I10 Essential (primary) hypertension: Secondary | ICD-10-CM

## 2017-10-18 DIAGNOSIS — N182 Chronic kidney disease, stage 2 (mild): Secondary | ICD-10-CM

## 2017-10-18 DIAGNOSIS — E782 Mixed hyperlipidemia: Secondary | ICD-10-CM

## 2017-10-18 DIAGNOSIS — I5023 Acute on chronic systolic (congestive) heart failure: Secondary | ICD-10-CM | POA: Diagnosis not present

## 2017-10-18 DIAGNOSIS — I5022 Chronic systolic (congestive) heart failure: Secondary | ICD-10-CM

## 2017-10-18 LAB — COMPREHENSIVE METABOLIC PANEL
ALT: 24 IU/L (ref 0–44)
AST: 46 IU/L — ABNORMAL HIGH (ref 0–40)
Albumin/Globulin Ratio: 1.5 (ref 1.2–2.2)
Albumin: 3.9 g/dL (ref 3.6–4.8)
Alkaline Phosphatase: 73 IU/L (ref 39–117)
BUN/Creatinine Ratio: 10 (ref 10–24)
BUN: 13 mg/dL (ref 8–27)
Bilirubin Total: 1 mg/dL (ref 0.0–1.2)
CO2: 26 mmol/L (ref 20–29)
Calcium: 9.3 mg/dL (ref 8.6–10.2)
Chloride: 100 mmol/L (ref 96–106)
Creatinine, Ser: 1.35 mg/dL — ABNORMAL HIGH (ref 0.76–1.27)
GFR calc Af Amer: 62 mL/min/{1.73_m2} (ref >59)
GFR calc non Af Amer: 54 mL/min/{1.73_m2} — ABNORMAL LOW (ref >59)
Globulin, Total: 2.6 g/dL (ref 1.5–4.5)
Glucose: 100 mg/dL — ABNORMAL HIGH (ref 65–99)
Potassium: 4.3 mmol/L (ref 3.5–5.2)
Sodium: 140 mmol/L (ref 134–144)
Total Protein: 6.5 g/dL (ref 6.0–8.5)

## 2017-10-18 LAB — LIPID PANEL
Chol/HDL Ratio: 2.2 ratio (ref 0.0–5.0)
Cholesterol, Total: 183 mg/dL (ref 100–199)
HDL: 84 mg/dL (ref >39)
LDL Calculated: 83 mg/dL (ref 0–99)
Triglycerides: 79 mg/dL (ref 0–149)
VLDL Cholesterol Cal: 16 mg/dL (ref 5–40)

## 2017-10-18 MED ORDER — FUROSEMIDE 40 MG PO TABS: 40.0000 mg | ORAL_TABLET | Freq: Every day | ORAL | 3 refills | Status: DC

## 2017-10-18 MED ORDER — POTASSIUM CHLORIDE CRYS ER 20 MEQ PO TBCR: 20.0000 meq | EXTENDED_RELEASE_TABLET | Freq: Every day | ORAL | 3 refills | Status: DC

## 2017-10-18 MED ORDER — ROSUVASTATIN CALCIUM 10 MG PO TABS: 10.0000 mg | ORAL_TABLET | Freq: Every day | ORAL | 3 refills | Status: DC

## 2017-10-18 NOTE — Patient Instructions (Addendum)
Medication Instructions:  Your physician has recommended you make the following change in your medication:   1. STOP: isosorbide dinitrate (isordil)  2. furosemide (lasix) 40 mg tablet: Take 1 tablet twice a day for 3 days only, then take 1 tablet once a day  3. Potassium 20 mEq tablet: Take 1 tablet twice a day for 3 days only, then take 1 tablet once a day  4. RESTART: rosuvastatin (crestor) 10 mg tablet: Take 1 tablet once a day   Labwork: TODAY: CMET, LIPIDS  Testing/Procedures: None ordered  Follow-Up: Your physician recommends that you follow-up on 11/09/17 at 11:00 AM with Ermalinda Barrios, PA   Any Other Special Instructions Will Be Listed Below (If Applicable).  Two Gram Sodium Diet 2000 mg  What is Sodium? Sodium is a mineral found naturally in many foods. The most significant source of sodium in the diet is table salt, which is about 40% sodium.  Processed, convenience, and preserved foods also contain a large amount of sodium.  The body needs only 500 mg of sodium daily to function,  A normal diet provides more than enough sodium even if you do not use salt.  Why Limit Sodium? A build up of sodium in the body can cause thirst, increased blood pressure, shortness of breath, and water retention.  Decreasing sodium in the diet can reduce edema and risk of heart attack or stroke associated with high blood pressure.  Keep in mind that there are many other factors involved in these health problems.  Heredity, obesity, lack of exercise, cigarette smoking, stress and what you eat all play a role.  General Guidelines:  Do not add salt at the table or in cooking.  One teaspoon of salt contains over 2 grams of sodium.  Read food labels  Avoid processed and convenience foods  Ask your dietitian before eating any foods not dicussed in the menu planning guidelines  Consult your physician if you wish to use a salt substitute or a sodium containing medication such as antacids.  Limit  milk and milk products to 16 oz (2 cups) per day.  Shopping Hints:  READ LABELS!! "Dietetic" does not necessarily mean low sodium.  Salt and other sodium ingredients are often added to foods during processing.   Menu Planning Guidelines Food Group Choose More Often Avoid  Beverages (see also the milk group All fruit juices, low-sodium, salt-free vegetables juices, low-sodium carbonated beverages Regular vegetable or tomato juices, commercially softened water used for drinking or cooking  Breads and Cereals Enriched white, wheat, rye and pumpernickel bread, hard rolls and dinner rolls; muffins, cornbread and waffles; most dry cereals, cooked cereal without added salt; unsalted crackers and breadsticks; low sodium or homemade bread crumbs Bread, rolls and crackers with salted tops; quick breads; instant hot cereals; pancakes; commercial bread stuffing; self-rising flower and biscuit mixes; regular bread crumbs or cracker crumbs  Desserts and Sweets Desserts and sweets mad with mild should be within allowance Instant pudding mixes and cake mixes  Fats Butter or margarine; vegetable oils; unsalted salad dressings, regular salad dressings limited to 1 Tbs; light, sour and heavy cream Regular salad dressings containing bacon fat, bacon bits, and salt pork; snack dips made with instant soup mixes or processed cheese; salted nuts  Fruits Most fresh, frozen and canned fruits Fruits processed with salt or sodium-containing ingredient (some dried fruits are processed with sodium sulfites        Vegetables Fresh, frozen vegetables and low- sodium canned vegetables Regular canned vegetables,  sauerkraut, pickled vegetables, and others prepared in brine; frozen vegetables in sauces; vegetables seasoned with ham, bacon or salt pork  Condiments, Sauces, Miscellaneous  Salt substitute with physician's approval; pepper, herbs, spices; vinegar, lemon or lime juice; hot pepper sauce; garlic powder, onion powder,  low sodium soy sauce (1 Tbs.); low sodium condiments (ketchup, chili sauce, mustard) in limited amounts (1 tsp.) fresh ground horseradish; unsalted tortilla chips, pretzels, potato chips, popcorn, salsa (1/4 cup) Any seasoning made with salt including garlic salt, celery salt, onion salt, and seasoned salt; sea salt, rock salt, kosher salt; meat tenderizers; monosodium glutamate; mustard, regular soy sauce, barbecue, sauce, chili sauce, teriyaki sauce, steak sauce, Worcestershire sauce, and most flavored vinegars; canned gravy and mixes; regular condiments; salted snack foods, olives, picles, relish, horseradish sauce, catsup   Food preparation: Try these seasonings Meats:    Pork Sage, onion Serve with applesauce  Chicken Poultry seasoning, thyme, parsley Serve with cranberry sauce  Lamb Curry powder, rosemary, garlic, thyme Serve with mint sauce or jelly  Veal Marjoram, basil Serve with current jelly, cranberry sauce  Beef Pepper, bay leaf Serve with dry mustard, unsalted chive butter  Fish [place] leaf, dill Serve with unsalted lemon butter, unsalted parsley butter  Vegetables:    Asparagus Lemon juice   Broccoli Lemon juice   Carrots Mustard dressing parsley, mint, nutmeg, glazed with unsalted butter and sugar   Green beans Marjoram, lemon juice, nutmeg,dill seed   Tomatoes Basil, marjoram, onion   Spice /blend for [employer]" 4 tsp ground thyme 1 tsp ground sage 3 tsp ground rosemary 4 tsp ground marjoram   Test your knowledge 1. A product that says "Salt Free" may still contain sodium. True or False 2. Garlic Powder and Hot Pepper Sauce an be used as alternative seasonings.True or False 3. Processed foods have more sodium than fresh foods.  True or False 4. Canned Vegetables have less sodium than froze True or False  WAYS TO DECREASE YOUR SODIUM INTAKE 1. Avoid the use of added salt in cooking and at the table.  Table salt (and other prepared seasonings which contain salt) is probably  one of the greatest sources of sodium in the diet.  Unsalted foods can gain flavor from the sweet, sour, and butter taste sensations of herbs and spices.  Instead of using salt for seasoning, try the following seasonings with the foods listed.  Remember: how you use them to enhance natural food flavors is limited only by your creativity... Allspice-Meat, fish, eggs, fruit, peas, red and yellow vegetables Almond Extract-Fruit baked goods Anise Seed-Sweet breads, fruit, carrots, beets, cottage cheese, cookies (tastes like licorice) Basil-Meat, fish, eggs, vegetables, rice, vegetables salads, soups, sauces Bay Leaf-Meat, fish, stews, poultry Burnet-Salad, vegetables (cucumber-like flavor) Caraway Seed-Bread, cookies, cottage cheese, meat, vegetables, cheese, rice Cardamon-Baked goods, fruit, soups Celery Powder or seed-Salads, salad dressings, sauces, meatloaf, soup, bread.Do not use  celery salt Chervil-Meats, salads, fish, eggs, vegetables, cottage cheese (parsley-like flavor) Chili Power-Meatloaf, chicken cheese, corn, eggplant, egg dishes Chives-Salads cottage cheese, egg dishes, soups, vegetables, sauces Cilantro-Salsa, casseroles Cinnamon-Baked goods, fruit, pork, lamb, chicken, carrots Cloves-Fruit, baked goods, fish, pot roast, green beans, beets, carrots Coriander-Pastry, cookies, meat, salads, cheese (lemon-orange flavor) Cumin-Meatloaf, fish,cheese, eggs, cabbage,fruit pie (caraway flavor) [employer], fruit, eggs, fish, poultry, cottage cheese, vegetables Dill Seed-Meat, cottage cheese, poultry, vegetables, fish, salads, bread Fennel Seed-Bread, cookies, apples, pork, eggs, fish, beets, cabbage, cheese, Licorice-like flavor Garlic-(buds or powder) Salads, meat, poultry, fish, bread, butter, vegetables, potatoes.Do not  use garlic  salt Ginger-Fruit, vegetables, baked goods, meat, fish, poultry Horseradish Root-Meet, vegetables, butter Lemon Juice or Extract-Vegetables, fruit,  tea, baked goods, fish salads Mace-Baked goods fruit, vegetables, fish, poultry (taste like nutmeg) Maple Extract-Syrups Marjoram-Meat, chicken, fish, vegetables, breads, green salads (taste like Sage) Mint-Tea, lamb, sherbet, vegetables, desserts, carrots, cabbage Mustard, Dry or Seed-Cheese, eggs, meats, vegetables, poultry Nutmeg-Baked goods, fruit, chicken, eggs, vegetables, desserts Onion Powder-Meat, fish, poultry, vegetables, cheese, eggs, bread, rice salads (Do not use   Onion salt) Orange Extract-Desserts, baked goods Oregano-Pasta, eggs, cheese, onions, pork, lamb, fish, chicken, vegetables, green salads Paprika-Meat, fish, poultry, eggs, cheese, vegetables Parsley Flakes-Butter, vegetables, meat fish, poultry, eggs, bread, salads (certain forms may   Contain sodium Pepper-Meat fish, poultry, vegetables, eggs Peppermint Extract-Desserts, baked goods Poppy Seed-Eggs, bread, cheese, fruit dressings, baked goods, noodles, vegetables, cottage  [employer], poultry, meat, fish, cauliflower, turnips,eggs bread Saffron-Rice, bread, veal, chicken, fish, eggs Sage-Meat, fish, poultry, onions, eggplant, tomateos, pork, stews Savory-Eggs, salads, poultry, meat, rice, vegetables, soups, pork Tarragon-Meat, poultry, fish, eggs, butter, vegetables (licorice-like flavor)  Thyme-Meat, poultry, fish, eggs, vegetables, (clover-like flavor), sauces, soups Tumeric-Salads, butter, eggs, fish, rice, vegetables (saffron-like flavor) Vanilla Extract-Baked goods, candy Vinegar-Salads, vegetables, meat marinades Walnut Extract-baked goods, candy  2. Choose your Foods Wisely   The following is a list of foods to avoid which are high in sodium:  Meats-Avoid all smoked, canned, salt cured, dried and kosher meat and fish as well as Anchovies   [employer] meats:Bologna, Liverwurst, Pastrami Canned meat or fish  Marinated  herring Caviar    Pepperoni Corned Beef   Pizza Dried chipped beef  Salami Frozen breaded fish or meat Salt pork Frankfurters or hot dogs  Sardines Gefilte fish   Sausage Ham (boiled ham, Proscuitto Smoked butt    spiced ham)   Spam      TV Dinners Vegetables Canned vegetables (Regular) Relish Canned mushrooms  Sauerkraut Olives    Tomato juice Pickles  Bakery and Dessert Products Canned puddings  Cream pies Cheesecake   Decorated cakes Cookies  Beverages/Juices Tomato juice, regular  Gatorade   V-8 vegetable juice, regular  Breads and Cereals Biscuit mixes   Salted potato chips, corn chips, pretzels Bread stuffing mixes  Salted crackers and rolls Pancake and waffle mixes Self-rising flour  Seasonings Accent    Meat sauces Barbecue sauce  Meat tenderizer Catsup    Monosodium glutamate (MSG) Celery salt   Onion salt Chili sauce   Prepared mustard Garlic salt   Salt, seasoned salt, sea salt Gravy mixes   Soy sauce Horseradish   Steak sauce Ketchup   Tartar sauce Lite salt    Teriyaki sauce Marinade mixes   Worcestershire sauce  Others Baking powder   Cocoa and cocoa mixes Baking soda   Commercial casserole mixes Candy-caramels, chocolate  Dehydrated soups    Bars, fudge,nougats  Instant rice and pasta mixes Canned broth or soup  Maraschino cherries Cheese, aged and processed cheese and cheese spreads  Learning Assessment Quiz  Indicated T (for True) or F (for False) for each of the following statements:  1. _____ Fresh fruits and vegetables and unprocessed grains are generally low in sodium 2. _____ Water may contain a considerable amount of sodium, depending on the source 3. _____ You can always tell if a food is high in sodium by tasting it 4. _____ Certain laxatives my be high in sodium and should be avoided unless prescribed   by a physician or pharmacist  5. _____ Salt substitutes may be used freely by anyone on a sodium restricted diet 6. _____ Sodium is  present in table salt, food additives and as a natural component of   most foods 7. _____ Table salt is approximately 90% sodium 8. _____ Limiting sodium intake may help prevent excess fluid accumulation in the body 9. _____ On a sodium-restricted diet, seasonings such as bouillon soy sauce, and    cooking wine should be used in place of table salt 10. _____ On an ingredient list, a product which lists monosodium glutamate as the first   ingredient is an appropriate food to include on a low sodium diet  Circle the best answer(s) to the following statements (Hint: there may be more than one correct answer)  11. On a low-sodium diet, some acceptable snack items are:    A. Olives  F. Bean dip   K. Grapefruit juice    B. Salted Pretzels G. Commercial Popcorn   L. Canned peaches    C. Carrot Sticks  H. Bouillon   M. Unsalted nuts   D. [place] fries  I. Peanut butter crackers N. Salami   E. Sweet pickles J. Tomato Juice   O. Pizza  12.  Seasonings that may be used freely on a reduced - sodium diet include   A. Lemon wedges F.Monosodium glutamate K. Celery seed    B.Soysauce   G. Pepper   L. Mustard powder   C. Sea salt  H. Cooking wine  M. Onion flakes   D. Vinegar  E. Prepared horseradish N. Salsa   E. Sage   J. Worcestershire sauce  O. Chutney   Steps to Quit Smoking Smoking tobacco can be bad for your health. It can also affect almost every organ in your body. Smoking puts you and people around you at risk for many serious long-lasting (chronic) diseases. Quitting smoking is hard, but it is one of the best things that you can do for your health. It is never too late to quit. What are the benefits of quitting smoking? When you quit smoking, you lower your risk for getting serious diseases and conditions. They can include:  Lung cancer or lung disease.  Heart disease.  Stroke.  Heart attack.  Not being able to have children (infertility).  Weak bones (osteoporosis) and  broken bones (fractures).  If you have coughing, wheezing, and shortness of breath, those symptoms may get better when you quit. You may also get sick less often. If you are pregnant, quitting smoking can help to lower your chances of having a baby of low birth weight. What can I do to help me quit smoking? Talk with your doctor about what can help you quit smoking. Some things you can do (strategies) include:  Quitting smoking totally, instead of slowly cutting back how much you smoke over a period of time.  Going to in-person counseling. You are more likely to quit if you go to many counseling sessions.  Using resources and support systems, such as: ? Database administrator with a Social worker. ? Phone quitlines. ? Careers information officer. ? Support groups or group counseling. ? Text messaging programs. ? Mobile phone apps or applications.  Taking medicines. Some of these medicines may have nicotine in them. If you are pregnant or breastfeeding, do not take any medicines to quit smoking unless your doctor says it is okay. Talk with your doctor about counseling or other things that can help you.  Talk with your doctor about using  more than one strategy at the same time, such as taking medicines while you are also going to in-person counseling. This can help make quitting easier. What things can I do to make it easier to quit? Quitting smoking might feel very hard at first, but there is a lot that you can do to make it easier. Take these steps:  Talk to your family and friends. Ask them to support and encourage you.  Call phone quitlines, reach out to support groups, or work with a Social worker.  Ask people who smoke to not smoke around you.  Avoid places that make you want (trigger) to smoke, such as: ? Bars. ? Parties. ? Smoke-break areas at work.  Spend time with people who do not smoke.  Lower the stress in your life. Stress can make you want to smoke. Try these things to help your  stress: ? Getting regular exercise. ? Deep-breathing exercises. ? Yoga. ? Meditating. ? Doing a body scan. To do this, close your eyes, focus on one area of your body at a time from head to toe, and notice which parts of your body are tense. Try to relax the muscles in those areas.  Download or buy apps on your mobile phone or tablet that can help you stick to your quit plan. There are many free apps, such as QuitGuide from the [employer] Office manager for Disease Control and Prevention). You can find more support from smokefree.gov and other websites.  This information is not intended to replace advice given to you by your health care provider. Make sure you discuss any questions you have with your health care provider. Document Released: 11/21/2008 Document Revised: 09/23/2015 Document Reviewed: 06/11/2014 Elsevier Interactive Patient Education  [employer].   If you need a refill on your cardiac medications before your next appointment, please call your pharmacy.

## 2017-10-21 ENCOUNTER — Ambulatory Visit (INDEPENDENT_AMBULATORY_CARE_PROVIDER_SITE_OTHER): Payer: Medicare Other | Admitting: Family Medicine

## 2017-11-08 ENCOUNTER — Ambulatory Visit: Payer: Medicare Other | Admitting: Physician Assistant

## 2017-11-09 ENCOUNTER — Encounter: Payer: Self-pay | Admitting: Physician Assistant

## 2017-11-09 ENCOUNTER — Ambulatory Visit (INDEPENDENT_AMBULATORY_CARE_PROVIDER_SITE_OTHER): Payer: Medicare Other | Admitting: Physician Assistant

## 2017-11-09 VITALS — BP 130/78 | HR 80 | Ht 68.0 in | Wt 182.0 lb

## 2017-11-09 DIAGNOSIS — I428 Other cardiomyopathies: Secondary | ICD-10-CM | POA: Diagnosis not present

## 2017-11-09 DIAGNOSIS — I10 Essential (primary) hypertension: Secondary | ICD-10-CM | POA: Diagnosis not present

## 2017-11-09 DIAGNOSIS — Z86711 Personal history of pulmonary embolism: Secondary | ICD-10-CM

## 2017-11-09 DIAGNOSIS — I5022 Chronic systolic (congestive) heart failure: Secondary | ICD-10-CM

## 2017-11-09 LAB — BASIC METABOLIC PANEL
BUN/Creatinine Ratio: 13 (ref 10–24)
BUN: 18 mg/dL (ref 8–27)
CO2: 23 mmol/L (ref 20–29)
Calcium: 9.2 mg/dL (ref 8.6–10.2)
Chloride: 102 mmol/L (ref 96–106)
Creatinine, Ser: 1.38 mg/dL — ABNORMAL HIGH (ref 0.76–1.27)
GFR calc Af Amer: 61 mL/min/{1.73_m2} (ref >59)
GFR calc non Af Amer: 53 mL/min/{1.73_m2} — ABNORMAL LOW (ref >59)
Glucose: 96 mg/dL (ref 65–99)
Potassium: 4.6 mmol/L (ref 3.5–5.2)
Sodium: 139 mmol/L (ref 134–144)

## 2017-11-09 NOTE — Progress Notes (Signed)
Cardiology Office Note    Date:  11/09/2017   ID:  Leonard [name], DOB 1950-07-23, MRN [id number]  PCP:  Billie Ruddy, MD  Cardiologist: [name], MD EPS: None  Chief Complaint  Patient presents with  . Follow-up    History of Present Illness:  Leonard [name] is a 67 y.o. male with history of hypertension, hypertensive heart disease, nonischemic cardiomyopathy, tobacco abuse, who had an abnormal echo with EF of 40 to 45% and grade 1 DD followed by cardiac catheterization 06/2016 found to have 40% proximal RCA, 25% distal RCA, 20% mid LAD moderate LV systolic dysfunction ejection fraction 35 to 45% normal pulmonary artery pressures.  Good blood pressure control was stressed.  Patient has had a cough with ACE inhibitors in the past and a rash with losartan.   Follow-up 2D echo 02/2017 showed further decrease in LV function EF now 30 to 35% with diffuse hypokinesis and grade 1 DD, also had moderate AI and mild MR.  Patient had been noncompliant with his medications. CT angiogram 02/28/2017 showed an acute pulmonary embolism in the right middle lobe and mild aneurysmal dilatation of the ascending thoracic aorta measuring 4.0 cm.  We did not see him that hospitalization.  He was sent home on Xarelto for 6 months.  In the emergency room 08/10/2017 with COPD exacerbation.   I saw the patient 10/18/2017 with acute on chronic CHF after running out of his medications for about 2 months.  I increased his Lasix to 80 mg daily for 3 days then back to 40 mg daily.  I resumed his carvedilol Crestor.  Creatinine was 1.35 that day.  LDL 83.  Patient comes in today for follow-up.  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  He still has shortness of breath.  He stopped taking potassium because of the size of the pill and is eating more bananas and potatoes.  No longer has leg cramps.    Past Medical History:  Diagnosis Date  . Asthma     . CHF (congestive heart failure) ([place])   . Hypertension   . Prostate cancer [facility])     Past Surgical History:  Procedure Laterality Date  . APPENDECTOMY    . HYDROCELE EXCISION Left 12/01/2015   Procedure: REPAIR OF LEFT HYDROCELE;  Surgeon: Raynelle Bring, MD;  Location: [facility];  Service: Urology;  Laterality: Left;  . LYMPHADENECTOMY Bilateral 09/24/2013   Procedure: LYMPHADENECTOMY;  Surgeon: Raynelle Bring, MD;  Location: [facility];  Service: Urology;  Laterality: Bilateral;  . PROSTATE BIOPSY    . RIGHT/LEFT HEART CATH AND CORONARY ANGIOGRAPHY N/A 06/10/2016   Procedure: Right/Left Heart Cath and Coronary Angiography;  Surgeon: Jettie Booze, MD;  Location: [place] [facility] LAB;  Service: Cardiovascular;  Laterality: N/A;  . ROBOT ASSISTED LAPAROSCOPIC RADICAL PROSTATECTOMY N/A 09/24/2013   Procedure: ROBOTIC ASSISTED LAPAROSCOPIC RADICAL PROSTATECTOMY LEVEL 2;  Surgeon: Raynelle Bring, MD;  Location: [facility];  Service: Urology;  Laterality: N/A;    Current Medications: Current Meds  Medication Sig  . albuterol (PROVENTIL HFA;VENTOLIN HFA) 108 (90 Base) MCG/ACT inhaler Inhale 2 puffs into the lungs every 4 (four) hours as needed for wheezing or shortness of breath.  Marland Kitchen albuterol (PROVENTIL HFA;VENTOLIN HFA) 108 (90 Base) MCG/ACT inhaler Inhale 2 puffs into the lungs every 4 (four) hours as needed for wheezing or shortness of breath.  Marland Kitchen albuterol (PROVENTIL) (2.5 MG/3ML) 0.083%  nebulizer solution Take 3 mLs (2.5 mg total) by nebulization every 6 (six) hours as needed for wheezing or shortness of breath.  . carvedilol (COREG) 25 MG tablet Take 1 tablet (25 mg total) by mouth 2 (two) times daily.  . furosemide (LASIX) 40 MG tablet Take 1 tablet (40 mg total) by mouth daily.  . potassium chloride SA (K-DUR,KLOR-CON) 20 MEQ tablet Take 1 tablet (20 mEq total) by mouth daily.  . rosuvastatin (CRESTOR) 10 MG tablet Take 1 tablet (10 mg total) by mouth daily.     Allergies:   Losartan   Social  History   Socioeconomic History  . Marital status: Single    Spouse name: Not on file  . Number of children: Not on file  . Years of education: Not on file  . Highest education level: Not on file  Occupational History  . Not on file  Social Needs  . Financial resource strain: Not on file  . Food insecurity:    Worry: Not on file    Inability: Not on file  . Transportation needs:    Medical: Not on file    Non-medical: Not on file  Tobacco Use  . Smoking status: Current Every Day Smoker    Packs/day: 2.00    Years: 30.00    Pack years: 60.00    Types: Cigarettes    Last attempt to quit: 12/24/2015    Years since quitting: 1.8  . Smokeless tobacco: Never Used  Substance and Sexual Activity  . Alcohol use: Yes    Comment: 5 glasses of alcohol per day  . Drug use: No    Types: Marijuana  . Sexual activity: Not Currently  Lifestyle  . Physical activity:    Days per week: Not on file    Minutes per session: Not on file  . Stress: Not on file  Relationships  . Social connections:    Talks on phone: Not on file    Gets together: Not on file    Attends religious service: Not on file    Active member of club or organization: Not on file    Attends meetings of clubs or organizations: Not on file    Relationship status: Not on file  Other Topics Concern  . Not on file  Social History Narrative  . Not on file     Family History:  The patient's family history includes Hypertension in his sister.   ROS:   Please see the history of present illness.    Review of Systems  Constitution: Negative.  HENT: Negative.   Cardiovascular: Positive for dyspnea on exertion.  Respiratory: Positive for sleep disturbances due to breathing.   Endocrine: Negative.   Hematologic/Lymphatic: Negative.   Musculoskeletal: Positive for back pain.  Gastrointestinal: Negative.   Genitourinary: Negative.   Neurological: Negative.    All other systems reviewed and are negative.   PHYSICAL  EXAM:   VS:  BP 130/78   Pulse 80   Ht 5\' 8"  (1.727 m)   Wt 182 lb (82.6 kg)   BMI 27.67 kg/m   Physical Exam  GEN: Well nourished, well developed, in no acute distress  Neck: slight increase JVD, no carotid bruits, or masses Cardiac:RRR; no murmurs, rubs, or gallops  Respiratory:  clear to auscultation bilaterally, normal work of breathing GI: soft, nontender, nondistended, + BS Ext: without cyanosis, clubbing, or edema, Good distal pulses bilaterally Neuro:  Alert and Oriented x 3 Psych: euthymic mood, full affect  Wt Readings  from Last 3 Encounters:  11/09/17 182 lb (82.6 kg)  10/18/17 177 lb 6.4 oz (80.5 kg)  08/09/17 170 lb (77.1 kg)      Studies/Labs Reviewed:   EKG:  EKG is not ordered today.    Recent Labs: 02/28/2017: B Natriuretic Peptide 176.6 08/09/2017: Hemoglobin 14.5; Platelets 220 10/18/2017: ALT 24; BUN 13; Creatinine, Ser 1.35; Potassium 4.3; Sodium 140   Lipid Panel    Component Value Date/Time   CHOL 183 10/18/2017 0843   TRIG 79 10/18/2017 0843   HDL 84 10/18/2017 0843   CHOLHDL 2.2 10/18/2017 0843   LDLCALC 83 10/18/2017 0843    Additional studies/ records that were reviewed today include:   CT angiogram 1/21/2019IMPRESSION: 1. Acute pulmonary embolism within the right middle lobe lateral segmental artery. No right heart strain. 2. Mild aneurysmal dilatation of the ascending thoracic aorta, measuring 4.0 cm. Recommend annual imaging followup by CTA or MRA. This recommendation follows 2010   2D echo 1/17/2019Study Conclusions   - Left ventricle: The cavity size was mildly dilated. There was   mild concentric hypertrophy. Systolic function was moderately to   severely reduced. The estimated ejection fraction was in the   range of 30% to 35%. Diffuse hypokinesis. Doppler parameters are   consistent with abnormal left ventricular relaxation (grade 1   diastolic dysfunction). There was no evidence of elevated   ventricular filling pressure by  Doppler parameters. - Aortic valve: There was moderate regurgitation. - Mitral valve: There was mild regurgitation. - Left atrium: The atrium was moderately dilated. - Right ventricle: The cavity size was normal. Wall thickness was   normal. Systolic function was normal. - Right atrium: The atrium was normal in size. - Tricuspid valve: There was trivial regurgitation. - Pulmonary arteries: Systolic pressure was within the normal   range. - Inferior vena cava: The vessel was normal in size. - Pericardium, extracardiac: There was no pericardial effusion.   Impressions:   - Since the last study on 05/06/16 LVEF has further decreased from   40-45% to 30-25% with diffuse hypokinesis.    Cardiac catheterization 5/2018Prox RCA lesion, 40 %stenosed.  Dist RCA lesion, 25 %stenosed.  Ramus lesion, 25 %stenosed.  Mid LAD lesion, 20 %stenosed.  There is moderate left ventricular systolic dysfunction.  LV end diastolic pressure is mildly elevated.  The left ventricular ejection fraction is 35-45% by visual estimate.  There is no aortic valve stenosis.  Ao sat 93%, PA sat 61%. CO 4.5 L/min. CI 2.2. Normal pulmonary artery pressures.  No AAA. Mild left renal artery stenosis. No right renal artery stenosis.   Nonobstructive coronary artery disease.  Patient with nonischemic cardiomyopathy, likely related to elevated blood pressure.   He needs aggressive BP control.  Increase coreg to 6.25 mg BID.  Will have him f/u in our HTN clinic with electrolytes to be checked.       ASSESSMENT:    1. Nonischemic cardiomyopathy ([place])   2. Chronic systolic heart failure ([place])   3. Essential hypertension   4. History of pulmonary embolus ([place])      PLAN:  In order of problems listed above:  Nonischemic cardiomyopathy ejection fraction 30 to 35% on echo 02/2017 which has further declined.  He had been off his medications for 2 months but is now back on them.  Nonobstructive CAD on cath in  2018.  Follow-up with Dr. Irish Lack in 4 months.  Chronic systolic CHF fairly well compensated.  Patient does not take Lasix on the  days he works and still has some shortness of breath.  Encourage compliance with Lasix and potassium.  Check renal function today.  Essential hypertension blood pressure much better controlled  History of pulmonary embolus 02/28/2017 treated with Xarelto for 6 months.    Medication Adjustments/Labs and Tests Ordered: Current medicines are reviewed at length with the patient today.  Concerns regarding medicines are outlined above.  Medication changes, Labs and Tests ordered today are listed in the Patient Instructions below. Patient Instructions  Medication Instructions:  Your physician recommends that you continue on your current medications as directed. Please refer to the Current Medication list given to you today.  TAKE FUROSEMIDE (LASIX) AND POTASSIUM AS PRESCRIBED  Labwork: TODAY: BMET  Testing/Procedures: None ordered  Follow-Up: Your physician wants you to follow-up in: 4 MONTHS with Dr. Irish Lack. You will receive a reminder letter in the mail two months in advance. If you don't receive a letter, please call our office to schedule the follow-up appointment.   Any Other Special Instructions Will Be Listed Below (If Applicable).     If you need a refill on your cardiac medications before your next appointment, please call your pharmacy.      Sumner Boast, PA-C  11/09/2017 11:35 AM    [facility] [address], [place], [place]  [address] Phone: [phone]; Fax: [phone]

## 2017-11-09 NOTE — Patient Instructions (Signed)
Medication Instructions:  Your physician recommends that you continue on your current medications as directed. Please refer to the Current Medication list given to you today.  TAKE FUROSEMIDE (LASIX) AND POTASSIUM AS PRESCRIBED  Labwork: TODAY: BMET  Testing/Procedures: None ordered  Follow-Up: Your physician wants you to follow-up in: 4 MONTHS with Dr. Irish Lack. You will receive a reminder letter in the mail two months in advance. If you don't receive a letter, please call our office to schedule the follow-up appointment.   Any Other Special Instructions Will Be Listed Below (If Applicable).     If you need a refill on your cardiac medications before your next appointment, please call your pharmacy.

## 2017-12-22 ENCOUNTER — Encounter (HOSPITAL_COMMUNITY): Payer: Self-pay | Admitting: *Deleted

## 2017-12-22 ENCOUNTER — Other Ambulatory Visit: Payer: Self-pay

## 2017-12-22 ENCOUNTER — Emergency Department (HOSPITAL_COMMUNITY)
Admission: EM | Admit: 2017-12-22 | Discharge: 2017-12-23 | Disposition: A | Discharge status: Home / Self Care | Payer: Medicare Other | Attending: Emergency Medicine | Admitting: Emergency Medicine

## 2017-12-22 DIAGNOSIS — F17210 Nicotine dependence, cigarettes, uncomplicated: Secondary | ICD-10-CM | POA: Insufficient documentation

## 2017-12-22 DIAGNOSIS — R0902 Hypoxemia: Secondary | ICD-10-CM | POA: Diagnosis not present

## 2017-12-22 DIAGNOSIS — Z743 Need for continuous supervision: Secondary | ICD-10-CM | POA: Diagnosis not present

## 2017-12-22 DIAGNOSIS — K529 Noninfective gastroenteritis and colitis, unspecified: Secondary | ICD-10-CM | POA: Insufficient documentation

## 2017-12-22 DIAGNOSIS — R112 Nausea with vomiting, unspecified: Secondary | ICD-10-CM | POA: Diagnosis not present

## 2017-12-22 DIAGNOSIS — N179 Acute kidney failure, unspecified: Secondary | ICD-10-CM | POA: Diagnosis not present

## 2017-12-22 DIAGNOSIS — J45909 Unspecified asthma, uncomplicated: Secondary | ICD-10-CM | POA: Diagnosis not present

## 2017-12-22 DIAGNOSIS — Z8546 Personal history of malignant neoplasm of prostate: Secondary | ICD-10-CM | POA: Diagnosis not present

## 2017-12-22 DIAGNOSIS — R110 Nausea: Secondary | ICD-10-CM | POA: Diagnosis not present

## 2017-12-22 DIAGNOSIS — Z79899 Other long term (current) drug therapy: Secondary | ICD-10-CM | POA: Diagnosis not present

## 2017-12-22 DIAGNOSIS — I110 Hypertensive heart disease with heart failure: Secondary | ICD-10-CM | POA: Diagnosis not present

## 2017-12-22 DIAGNOSIS — I491 Atrial premature depolarization: Secondary | ICD-10-CM | POA: Diagnosis not present

## 2017-12-22 DIAGNOSIS — I509 Heart failure, unspecified: Secondary | ICD-10-CM | POA: Diagnosis not present

## 2017-12-22 LAB — CBC
HCT: 45.6 % (ref 39.0–52.0)
Hemoglobin: 15.1 g/dL (ref 13.0–17.0)
MCH: 31.9 pg (ref 26.0–34.0)
MCHC: 33.1 g/dL (ref 30.0–36.0)
MCV: 96.2 fL (ref 80.0–100.0)
Platelets: 245 10*3/uL (ref 150–400)
RBC: 4.74 MIL/uL (ref 4.22–5.81)
RDW: 13.9 % (ref 11.5–15.5)
WBC: 13.9 10*3/uL — ABNORMAL HIGH (ref 4.0–10.5)
nRBC: 0 % (ref 0.0–0.2)

## 2017-12-22 LAB — COMPREHENSIVE METABOLIC PANEL
ALT: 30 U/L (ref 0–44)
AST: 54 U/L — ABNORMAL HIGH (ref 15–41)
Albumin: 4 g/dL (ref 3.5–5.0)
Alkaline Phosphatase: 57 U/L (ref 38–126)
Anion gap: 12 (ref 5–15)
BUN: 18 mg/dL (ref 8–23)
CO2: 26 mmol/L (ref 22–32)
Calcium: 9.7 mg/dL (ref 8.9–10.3)
Chloride: 101 mmol/L (ref 98–111)
Creatinine, Ser: 2 mg/dL — ABNORMAL HIGH (ref 0.61–1.24)
GFR calc Af Amer: 38 mL/min — ABNORMAL LOW (ref >60)
GFR calc non Af Amer: 33 mL/min — ABNORMAL LOW (ref >60)
Glucose, Bld: 145 mg/dL — ABNORMAL HIGH (ref 70–99)
Potassium: 3.6 mmol/L (ref 3.5–5.1)
Sodium: 139 mmol/L (ref 135–145)
Total Bilirubin: 1.3 mg/dL — ABNORMAL HIGH (ref 0.3–1.2)
Total Protein: 7.8 g/dL (ref 6.5–8.1)

## 2017-12-22 LAB — LIPASE, BLOOD: Lipase: 36 U/L (ref 11–51)

## 2017-12-22 LAB — ETHANOL: Alcohol, Ethyl (B): 22 mg/dL — ABNORMAL HIGH (ref <10)

## 2017-12-22 MED ORDER — SODIUM CHLORIDE 0.9 % IV BOLUS
500.0000 mL | Freq: Once | INTRAVENOUS | Status: AC
  Administered 2017-12-22: 500 mL via INTRAVENOUS

## 2017-12-22 MED ORDER — ONDANSETRON HCL 4 MG/2ML IJ SOLN
4.0000 mg | Freq: Once | INTRAMUSCULAR | Status: AC
  Administered 2017-12-22: 4 mg via INTRAVENOUS
  Filled 2017-12-22: qty 2

## 2017-12-22 NOTE — ED Triage Notes (Addendum)
Pt reports about 3 hours ago he started having nausea, diarrhea, and  vomiting. Pt says he does drink everyday, today he has had 4 drinks of whiskey. He also c/o dizziness.

## 2017-12-22 NOTE — ED Triage Notes (Signed)
Pt arrives from home via EMS. From their report, he has had "unknown amount" of ETOH tonight and he c/o vomiting. BP 110/70, hr 88.

## 2017-12-22 NOTE — ED Provider Notes (Signed)
[facility] DEPT Provider Note: Georgena Spurling, MD, FACEP  CSN: [id number] MRN: [id number] ARRIVAL: 12/22/17 at 2139 ROOM: WA02/WA02   CHIEF COMPLAINT  Vomiting   HISTORY OF PRESENT ILLNESS  12/22/17 10:58 PM Leonard [name] is a 67 y.o. male with a history of alcohol abuse.  He is here with nausea, vomiting and diarrhea that began about 3 hours prior to arrival.  He also complains of lightheadedness and  feels generally weak.  He denies abdominal pain.  He admits to drinking alcohol earlier.   Past Medical History:  Diagnosis Date  . Asthma   . CHF (congestive heart failure) ([place])   . Hypertension   . Prostate cancer [facility])     Past Surgical History:  Procedure Laterality Date  . APPENDECTOMY    . HYDROCELE EXCISION Left 12/01/2015   Procedure: REPAIR OF LEFT HYDROCELE;  Surgeon: Raynelle Bring, MD;  [facility];  Service: Urology;  Laterality: Left;  . LYMPHADENECTOMY Bilateral 09/24/2013   Procedure: LYMPHADENECTOMY;  Surgeon: Raynelle Bring, MD;  [facility];  Service: Urology;  Laterality: Bilateral;  . PROSTATE BIOPSY    . RIGHT/LEFT HEART CATH AND CORONARY ANGIOGRAPHY N/A 06/10/2016   Procedure: Right/Left Heart Cath and Coronary Angiography;  Surgeon: Jettie Booze, MD;  Location: [place] CV LAB;  Service: Cardiovascular;  Laterality: N/A;  . ROBOT ASSISTED LAPAROSCOPIC RADICAL PROSTATECTOMY N/A 09/24/2013   Procedure: ROBOTIC ASSISTED LAPAROSCOPIC RADICAL PROSTATECTOMY LEVEL 2;  Surgeon: Raynelle Bring, MD;  [facility];  Service: Urology;  Laterality: N/A;    Family History  Problem Relation Age of Onset  . Hypertension Sister   . Cancer Neg Hx     Social History   Tobacco Use  . Smoking status: Current Every Day Smoker    Packs/day: 2.00    Years: 30.00    Pack years: 60.00    Types: Cigarettes    Last attempt to quit: 12/24/2015    Years since quitting: 2.0  . Smokeless tobacco: Never Used  Substance Use Topics  . Alcohol use:  Yes    Comment: 5 glasses of alcohol per day  . Drug use: No    Types: Marijuana    Prior to Admission medications   Medication Sig Start Date End Date Taking? Authorizing Provider  albuterol (PROVENTIL HFA;VENTOLIN HFA) 108 (90 Base) MCG/ACT inhaler Inhale 2 puffs into the lungs every 4 (four) hours as needed for wheezing or shortness of breath. 08/10/17  Yes Horton, Barbette Hair, MD  albuterol (PROVENTIL) (2.5 MG/3ML) 0.083% nebulizer solution Take 3 mLs (2.5 mg total) by nebulization every 6 (six) hours as needed for wheezing or shortness of breath. 01/19/16  Yes Milagros Loll, MD  carvedilol (COREG) 25 MG tablet Take 1 tablet (25 mg total) by mouth 2 (two) times daily. 10/17/17 01/15/18 Yes Jettie Booze, MD  COMBIGAN 0.2-0.5 % ophthalmic solution Place 1 drop into both eyes daily. 07/15/16  Yes [provider]  furosemide (LASIX) 40 MG tablet Take 1 tablet (40 mg total) by mouth daily. 10/18/17  Yes Imogene Burn, PA-C  latanoprost (XALATAN) 0.005 % ophthalmic solution Place 1 drop into both eyes at bedtime.  03/04/16  Yes [provider]  potassium chloride SA (K-DUR,KLOR-CON) 20 MEQ tablet Take 1 tablet (20 mEq total) by mouth daily. 10/18/17  Yes Imogene Burn, PA-C  SYMBICORT 160-4.5 MCG/ACT inhaler Inhale 1 puff into the lungs daily. 12/08/17  Yes [provider]  albuterol (PROVENTIL HFA;VENTOLIN HFA) 108 (90  Base) MCG/ACT inhaler Inhale 2 puffs into the lungs every 4 (four) hours as needed for wheezing or shortness of breath. Patient not taking: Reported on 12/22/2017 03/21/17   Billie Ruddy, MD  rosuvastatin (CRESTOR) 10 MG tablet Take 1 tablet (10 mg total) by mouth daily. Patient not taking: Reported on 12/22/2017 10/18/17   Imogene Burn, PA-C    Allergies Losartan   REVIEW OF SYSTEMS  Negative except as noted here or in the History of Present Illness.   PHYSICAL EXAMINATION  Initial Vital Signs Blood pressure 109/64, pulse 82,  resp. rate 18, height 5' 7.5" (1.715 m), weight 81.6 kg, SpO2 94 %.  Examination General: Well-developed, well-nourished male in no acute distress; appearance consistent with age of record HENT: normocephalic; atraumatic Eyes: pupils equal, round and reactive to light; extraocular muscles intact; arcus senilis bilaterally Neck: supple Heart: regular rate and rhythm Lungs: clear to auscultation bilaterally Abdomen: soft; nondistended; nontender; no masses or hepatosplenomegaly; bowel sounds present Extremities: No deformity; full range of motion; pulses normal Neurologic: Awake, somewhat lethargic; motor function intact in all extremities and symmetric; no facial droop Skin: Warm and dry Psychiatric: Flat affect   RESULTS  Summary of this visit's results, reviewed by myself:   EKG Interpretation  Date/Time:    Ventricular Rate:    PR Interval:    QRS Duration:   QT Interval:    QTC Calculation:   R Axis:     Text Interpretation:        Laboratory Studies: Results for orders placed or performed during the hospital encounter of 12/22/17 (from the past 24 hour(s))  Urinalysis, Routine w reflex microscopic     Status: Abnormal   Collection Time: 12/22/17  9:59 PM  Result Value Ref Range   Color, Urine YELLOW YELLOW   APPearance HAZY (A) CLEAR   Specific Gravity, Urine 1.014 1.005 - 1.030   pH 5.0 5.0 - 8.0   Glucose, UA NEGATIVE NEGATIVE mg/dL   Hgb urine dipstick MODERATE (A) NEGATIVE   Bilirubin Urine NEGATIVE NEGATIVE   Ketones, ur NEGATIVE NEGATIVE mg/dL   Protein, ur 100 (A) NEGATIVE mg/dL   Nitrite NEGATIVE NEGATIVE   Leukocytes, UA NEGATIVE NEGATIVE   RBC / HPF 0-5 0 - 5 RBC/hpf   WBC, UA 0-5 0 - 5 WBC/hpf   Bacteria, UA RARE (A) NONE SEEN   Squamous Epithelial / LPF 0-5 0 - 5   Mucus PRESENT    Hyaline Casts, UA PRESENT   Lipase, blood     Status: None   Collection Time: 12/22/17 10:19 PM  Result Value Ref Range   Lipase 36 11 - 51 U/L  Comprehensive  metabolic panel     Status: Abnormal   Collection Time: 12/22/17 10:19 PM  Result Value Ref Range   Sodium 139 135 - 145 mmol/L   Potassium 3.6 3.5 - 5.1 mmol/L   Chloride 101 98 - 111 mmol/L   CO2 26 22 - 32 mmol/L   Glucose, Bld 145 (H) 70 - 99 mg/dL   BUN 18 8 - 23 mg/dL   Creatinine, Ser 2.00 (H) 0.61 - 1.24 mg/dL   Calcium 9.7 8.9 - 10.3 mg/dL   Total Protein 7.8 6.5 - 8.1 g/dL   Albumin 4.0 3.5 - 5.0 g/dL   AST 54 (H) 15 - 41 U/L   ALT 30 0 - 44 U/L   Alkaline Phosphatase 57 38 - 126 U/L   Total Bilirubin 1.3 (H) 0.3 - 1.2 mg/dL  GFR calc non Af Amer 33 (L) >60 mL/min   GFR calc Af Amer 38 (L) >60 mL/min   Anion gap 12 5 - 15  CBC     Status: Abnormal   Collection Time: 12/22/17 10:19 PM  Result Value Ref Range   WBC 13.9 (H) 4.0 - 10.5 K/uL   RBC 4.74 4.22 - 5.81 MIL/uL   Hemoglobin 15.1 13.0 - 17.0 g/dL   HCT 45.6 39.0 - 52.0 %   MCV 96.2 80.0 - 100.0 fL   MCH 31.9 26.0 - 34.0 pg   MCHC 33.1 30.0 - 36.0 g/dL   RDW 13.9 11.5 - 15.5 %   Platelets 245 150 - 400 K/uL   nRBC 0.0 0.0 - 0.2 %  Ethanol     Status: Abnormal   Collection Time: 12/22/17 10:54 PM  Result Value Ref Range   Alcohol, Ethyl (B) 22 (H) <10 mg/dL   Imaging Studies: No results found.  ED COURSE and MDM  Nursing notes and initial vitals signs, including pulse oximetry, reviewed.  Vitals:   12/23/17 0000 12/23/17 0030 12/23/17 0100 12/23/17 0130  BP: 132/78 (!) 112/56 108/64 124/81  Pulse: 72 81 81 73  Resp: 16 17 20 13   SpO2: 91% 91% 93% 94%  Weight:      Height:       1:40 AM Patient feeling much better after IV fluids and Zofran.  He is drinking fluids without emesis.  Patient encouraged to increase nonalcoholic fluid intake due to mild acute kidney injury.  PROCEDURES    ED DIAGNOSES     ICD-10-CM   1. Gastroenteritis K52.9   2. AKI (acute kidney injury) ([place]) N17.9        Jamya Starry, MD 12/23/17 [phone]

## 2017-12-23 LAB — URINALYSIS, ROUTINE W REFLEX MICROSCOPIC
Bilirubin Urine: NEGATIVE
Glucose, UA: NEGATIVE mg/dL
Ketones, ur: NEGATIVE mg/dL
Leukocytes, UA: NEGATIVE
Nitrite: NEGATIVE
Protein, ur: 100 mg/dL — AB
Specific Gravity, Urine: 1.014 (ref 1.005–1.030)
pH: 5 (ref 5.0–8.0)

## 2017-12-23 MED ORDER — ONDANSETRON 8 MG PO TBDP: 8.0000 mg | ORAL_TABLET | Freq: Three times a day (TID) | ORAL | 0 refills | Status: DC | PRN

## 2017-12-23 NOTE — ED Notes (Signed)
Discharge papers were left at bedside, patient had already left the building. Papers were given to charger,Terri RN.

## 2017-12-23 NOTE — ED Notes (Signed)
Cab called.

## 2017-12-23 NOTE — ED Notes (Signed)
Patient aware we need a urine sample, will call out when ready to void.

## 2017-12-28 ENCOUNTER — Encounter: Payer: Self-pay | Admitting: *Deleted

## 2017-12-28 ENCOUNTER — Other Ambulatory Visit: Payer: Self-pay | Admitting: *Deleted

## 2017-12-28 DIAGNOSIS — Z720 Tobacco use: Secondary | ICD-10-CM

## 2017-12-28 DIAGNOSIS — F1010 Alcohol abuse, uncomplicated: Secondary | ICD-10-CM | POA: Insufficient documentation

## 2017-12-28 DIAGNOSIS — I5022 Chronic systolic (congestive) heart failure: Secondary | ICD-10-CM

## 2017-12-28 DIAGNOSIS — H409 Unspecified glaucoma: Secondary | ICD-10-CM | POA: Insufficient documentation

## 2017-12-28 DIAGNOSIS — J441 Chronic obstructive pulmonary disease with (acute) exacerbation: Secondary | ICD-10-CM

## 2017-12-28 NOTE — Assessment & Plan Note (Signed)
Patient states he drinks 6-7 liquor drinks per day.

## 2017-12-28 NOTE — Patient Outreach (Signed)
[facility]) Care Management  12/28/2017  Leonard [name] 12-Dec-1950 [id number]    TELEPHONE SCREENING Referral date:12/26/17 Referral source: [place] [facility] Utilization Management Department for [facility] plan Referral reason: 2 recent ED visits, did not pick up Rx for Zofran that was ordered upon discharge, had positive etoh level and admits to daily drinking Insurance: [facility]  Telephone call to patient regarding recent emergency room visit of 12/22/17 for gastroenteritis with positive blood etoh level of 22. Telephone assessment completed and Leonard [name] Management services discussed.  Patient admits to daily 6-7 liquor drinks per day and says he would be open to receiving counseling from [employer]. He also says he has reduce his smoking significantly, from 2 packs per day to 4 cigarettes per day but would like to stop completely and would like counseling assistance. Discussed current treatment plan for heart failure and nonischemic cardiomyopathy and COPD. Patient says he saw his cardiologist on 11/09/17. He says he does not weigh daily but says he does have a scale.Teaching done with patient regarding importance of weighing daily after morning urination and recording weight; call cardiologist for weight gain of 2-3 lbs overnight or 5 lbs in 5 days. 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 Also says he has trouble swallowing potassium pill due to the large size and requests liquid form. Patient  advised this RNCM will contact cardiology provider. Discussed referral to [facility] and he agrees. Says he has had no  recent exacerbations of COPD and says he is adherent with his treatment plan except that he rarely uses his nebulizer. (Per Epic, he was seen in the emergency department on 08/09/17 for COPD exacerbation) He says he did not received the flu vaccine when he saw Dr Criss Rosales two weeks ago to establish care. Advised him  to go to [facility] where he gets his prescriptions filled as soon as possible since he is at greater risk for serious illness due to comorbidities.  Says he has not had his eye gtts filled for treatment of glaucoma because he needs refills as he did not follow up with his eye doctor after eye surgery 2 years ago.  Advised patient this RNCM will contact Dr. Criss Rosales and patient agrees to call for eye MD appointment at [facility].   PLAN: referral sent to [facility] social work for smoking and drinking cessation counseling and to [employer] for ongoing self management assistance with COPD and heart failure.  Barrington Ellison RN,CCM,CDE [place] Management Coordinator Office Phone [phone] Office Fax [phone]

## 2017-12-28 NOTE — Addendum Note (Signed)
Addended by: Barrington Ellison on: 12/28/2017 03:14 PM   Modules accepted: Orders

## 2017-12-29 ENCOUNTER — Other Ambulatory Visit: Payer: Self-pay | Admitting: *Deleted

## 2017-12-29 ENCOUNTER — Encounter: Payer: Self-pay | Admitting: *Deleted

## 2017-12-29 ENCOUNTER — Telehealth: Payer: Self-pay

## 2017-12-29 DIAGNOSIS — Z720 Tobacco use: Secondary | ICD-10-CM

## 2017-12-29 MED ORDER — POTASSIUM CHLORIDE 40 MEQ/15ML (20%) PO SOLN: 20.0000 meq | Freq: Every day | ORAL | 2 refills | Status: DC

## 2017-12-29 NOTE — Telephone Encounter (Signed)
-----   Message from Imogene Burn, PA-C sent at 12/28/2017  3:45 PM EST ----- Regarding: FW: Mutual Patient Can you change his potassium to liquid form? Thanks! ----- Message ----- From: Barrington Ellison, RN Sent: 12/28/2017   3:15 PM EST To: Imogene Burn, PA-C Subject: [facility] Patient                                 Selinda Eon, I completed a telephone assessment of Leonard [name] today. I am referring him to a [place] and Education officer, museum. (Please see Leonard full Epic note for details.)  He says he has trouble swallowing the potassium tablet and is requesting the liquid form be called to his pharmacy. He uses [facility] on [address]. Thank you in advance for consideration of this request. Barrington Ellison RN,CCM,CDE [place] Management Coordinator Office Phone [phone] Office Fax [phone]

## 2017-12-29 NOTE — Patient Outreach (Signed)
[facility]) Care Management  12/29/2017  Leonard [name] 02-03-1951 [id number]   Received request from [place] [facility] social worker [name] for referral to [place] [facility] pharmacy for assistance with medications for smoking cessation. Plan: Referral to pharmacy placed.  Barrington Ellison RN,CCM,CDE [place] Management Coordinator Office Phone [phone] Office Fax [phone]

## 2017-12-29 NOTE — Telephone Encounter (Signed)
Rx for liquid potassium sent in to [facility] on [address]

## 2017-12-29 NOTE — Patient Outreach (Signed)
[place] [facility]) Care Management  12/29/2017  Jami Ohlin 1951-01-26 [id number]  CSW was able to make initial contact with patient today to perform phone assessment, as well as assess and assist with social work needs and services.  CSW introduced self, explained role and types of services provided through [place] [facility] ([place] [facility]).  CSW further explained to patient that CSW works with patient's RNCM, also with [place] Management, Kelli Churn. CSW then explained the reason for the call, indicating that Mrs. Broadus John thought that patient would benefit from social work services and resources to assist with counseling and supportive services to help patient quit smoking.  CSW obtained two HIPAA compliant identifiers from patient, which included patient's name and date of birth.  Patient started off the conversation by stating, "I have got to put these things down", referring to cigarettes.  Patient then stated, "I think I about have this thing licked", indicating that he is down to only smoking 4 cigarettes per day, as opposed to 2 packs per day, which he had been smoking since he was a teenager.  Patient explained to CSW that he is no longer inhaling the cigarette smoke, but blowing it out of his mouth as soon as he takes a puff.  CSW spoke with patient about attending Smoking Cessation classes offered through the [facility], all free of charge.  CSW explained to patient that the next Smoking Cessation Program begins on Monday, February 13, 2018 and runs until Monday, March 06, 2018, for a total of 4 sessions, Monday and Wednesday evenings from 5:30 PM to 6:30 PM, at [facility]  Patient admitted that he did not think that this would be something that he would be interested in attending, but took down the information, just in case.  CSW explained to patient that registration is still open and that there  are currently 16 slots left available, if he changes his mind.  Patient was also given the contact number for [place] @ # [phone], if he has additional questions, is interested in registering for classes or if he would like to request a QuitSmart kit.  After thorough research, CSW explained to patient that there are currently 3 types of medications available, over-the-counter at most pharmacies, that can help ease the symptoms of nicotine withdrawal when used as directed, as well as 4 types of medications available by prescription.  These medications include all of the following: Three types of medications are available, over the counter. Nicotine gum  Nicotine patches  Nicotine lozenges Four types of medications are available, by prescription. Nicotine inhalers  Nicotine nasal sprays  Zyban (bupropion) - an antidepressant  Chantix (varenicline) - a drug that blocks the effects of nicotine in the brain      Patient indicated that he will speak with his Primary Care Physician, Dr. Lucianne Lei about which treatment would be most effective for him, if he decides to go that route.  CSW talked with patient about receiving counseling and supportive services to help him quit smoking, offering to provide these services to patient, free of charge, in the comfort of his own home.  Patient was most appreciative of the offer but politely declined, reporting that "counseling is really not my thing".  CSW then explained to patient that counseling services are also offered through the [employer], providing patient with the contact information.  CSW then added that it has been proven that people who  use telephone counseling have twice the success rate in quitting smoking as those who do not choose to get this type of help.    CSW will perform a case closure on patient, as all goals of treatment have been met from social work standpoint and no additional social work needs have been identified at this  time.  CSW will notify patient's RNCM with [place] Management, Kelli Churn of CSW's plans to close patient's case.  CSW will fax an update to patient's Primary Care Physician, Dr. Lucianne Lei to ensure that they are aware of CSW's involvement with patient's plan of care.  Patient is aware that he will be receiving a call from Johny Shock, [place], also with [place] [facility], for COPD (Chronic Obstructive Pulmonary Disease) and Heart Failure self management assistance.  [name], BSW, MSW, LCSW  Licensed Education officer, [facility]  Mailing [place] [address] [place], [place] [address] Physical Address-[address], [place], [place] [address] Toll Free Main # [phone] Fax # [phone] Cell # [phone]  Office # [phone] [email or web address]

## 2017-12-29 NOTE — Patient Outreach (Addendum)
[place] [facility]) Care Management  12/29/2017  Leonard [name] 06/11/1950 [id number]   Spoke with Tameka at the [facility] to follow up faxed request on 11/20 for patient's request for glaucoma eye gtt  (Combigan and Xalatan) refills. Tameka states she has the letter that was faxed and she will discuss with Larkin Ina NP as she was the provider that saw the patient, not Dr Criss Rosales, as patient reported to this RNCM.   Barrington Ellison RN,CCM,CDE [place] Management Coordinator Office Phone [phone] Office Fax [phone]

## 2018-01-02 ENCOUNTER — Ambulatory Visit: Payer: Self-pay | Admitting: *Deleted

## 2018-01-02 ENCOUNTER — Encounter: Payer: Self-pay | Admitting: *Deleted

## 2018-01-03 ENCOUNTER — Other Ambulatory Visit: Payer: Self-pay

## 2018-01-03 ENCOUNTER — Other Ambulatory Visit: Payer: Self-pay | Admitting: *Deleted

## 2018-01-03 NOTE — Patient Outreach (Signed)
[facility]) Care Management  [place]  01/03/2018  Leonard [name] [birth year] [id number]  Reason for referral: medication assistance for obtaining smoking cessation med.   Successful telephone call attempt #1  to patient.  Leonard [name] reports he is getting ready to go to work and will be going out of town for the holidays.  He requested that I call him back on Monday 12/2.  Plan:  Outreach attempt on 12/2.   Joetta Manners, PharmD Clinical Pharmacist [place] [phone]

## 2018-01-03 NOTE — Patient Outreach (Signed)
[place] [facility]) Care Management  01/03/2018  Leonard [name] 25-Nov-1950 [id number]   Spoke with [name] via cell phone number to advise him that his cardiology provider Ermalinda Barrios PA-C sent this [facility] a note on 11/25 stating a Rx for liquid KCl was faxed to his pharmacy and that a written and telephone request was made to his primary care provider's office on 11/20 and 11/21 respectively, regarding his need for refills on his eye gtts, Combigan and Xalatan.  [name] expressed appreciation to this [facility] for assistance with the Rxs.  Barrington Ellison RN,CCM,CDE [place] Management Coordinator Office Phone [phone] Office Fax [phone]

## 2018-01-09 ENCOUNTER — Other Ambulatory Visit: Payer: Self-pay

## 2018-01-09 ENCOUNTER — Ambulatory Visit: Payer: Self-pay

## 2018-01-09 NOTE — Patient Outreach (Signed)
[facility] [name]) Care Management  [place]   01/09/2018  Leonard [name] Jan 03, 1951 [id number]   Reason for referral: smoking cessation   Referral source: [facility] CM RN Current insurance:[employer]  PMHx:hypertension, heart failure rEF, h/o pulmonary embolism, asthma, ho prostate cancer, CKD Stage 2, hyperlipidemia and smoker   HPI:  Mr. Ligman reports that he is ready to stop smoking.  He states that 6 months ago he was smoking 2 packs/day and now he is down to 8 cigarettes/day without inhaling.  He states he enjoys the taste and the "motions of smoking."  He reports quitting 2 years for ~ 1 year and then he started back.  He is not interested in any gums, patches or sprays and states that he has not affordability issues with his medications.  He states that he has not issues with depression, suicidal thoughts or seizures.    Objective: Lab Results  Component Value Date   CREATININE 2.00 (H) 12/22/2017   CREATININE 1.38 (H) 11/09/2017   CREATININE 1.35 (H) 10/18/2017    Lab Results  Component Value Date   HGBA1C 4.8 01/11/2016    Lipid Panel     Component Value Date/Time   CHOL 183 10/18/2017 0843   TRIG 79 10/18/2017 0843   HDL 84 10/18/2017 0843   CHOLHDL 2.2 10/18/2017 0843   LDLCALC 83 10/18/2017 0843    BP Readings from Last 3 Encounters:  12/23/17 124/81  11/09/17 130/78  10/18/17 120/64    Allergies  Allergen Reactions  . Losartan Rash and Other (See Comments)    Abdominal and leg rashes noted in 12/17    Medications Reviewed Today    Reviewed by Dionne Milo, [facility] (Pharmacist) on 01/09/18 at 1051  Med List Status: <None>  Medication Order Taking? Sig Documenting Provider Last Dose Status Informant  albuterol (PROVENTIL HFA;VENTOLIN HFA) 108 (90 Base) MCG/ACT inhaler [id number] Yes Inhale 2 puffs into the lungs every 4 (four) hours as needed for wheezing or shortness of breath. Horton, Barbette Hair, MD Taking Active Self  albuterol  (PROVENTIL) (2.5 MG/3ML) 0.083% nebulizer solution [id number] No Take 3 mLs (2.5 mg total) by nebulization every 6 (six) hours as needed for wheezing or shortness of breath.  Patient not taking:  Reported on 12/28/2017   Milagros Loll, MD Not Taking Active Self  carvedilol (COREG) 25 MG tablet [id number] Yes Take 1 tablet (25 mg total) by mouth 2 (two) times daily. Jettie Booze, MD Taking Active Self  COMBIGAN 0.2-0.5 % ophthalmic solution [id number] Yes Place 1 drop into both eyes daily. [provider] Taking Active Self           Med Note (Sabrie Moritz, Kathlyn [place] Jan 09, 2018 10:44 AM)    furosemide (LASIX) 40 MG tablet [id number] Yes Take 1 tablet (40 mg total) by mouth daily. Imogene Burn, PA-C Taking Active Self  latanoprost (XALATAN) 0.005 % ophthalmic solution [id number] Yes Place 1 drop into both eyes at bedtime.  [provider] Taking Active Self           Med Note (Zamirah Denny, Kathlyn [place] Jan 09, 2018 10:45 AM)    ondansetron (ZOFRAN ODT) 8 MG disintegrating tablet [id number] No Take 1 tablet (8 mg total) by mouth every 8 (eight) hours as needed for nausea or vomiting.  Patient not taking:  Reported on 12/28/2017   Molpus, John, MD Not Taking Active   Potassium Chloride 40 MEQ/15ML (20%) SOLN [id number] Yes  Take 20 mEq by mouth daily. Murrell Converse Taking Active   SYMBICORT 160-4.5 MCG/ACT inhaler [id number] Yes Inhale 1 puff into the lungs daily. [provider] Taking Active Self          Assessment:  Drugs sorted by system:  Neurologic/Psychologic:  Cardiovascular: carvedilol, furosemide, potassium chloride  Pulmonary/Allergy: albuterol MDI and nebs, budesonide/formoterol   Gastrointestinal: ondansetron ODT,   Topical: brimonidine/timolol, latanoprost  Medication Review Findings:  . Patient is picking up his KCL solution from the pharmacy today.  I informed him to make sure he speaks with the pharmacist at the  store to ensure that he purchases a measuring device and understands how to draw up 7.5 ml which = 20 meq of KCl.   . Counseled the patient on the side effects of Varenicline and Bupropion XL for smoking cessation.   . Patient states he is picking up his Combigan and Latanoprost from his pharmacy today.  He had previously been out of these medications.   Plan: Outreach to PCP, Dr. Criss Rosales and request that she call in a prescription for smoking cessation.  Follow up with patient after I hear back from PCP with counseling.  Joetta Manners, PharmD Clinical Pharmacist [place] [phone]

## 2018-01-11 ENCOUNTER — Other Ambulatory Visit: Payer: Self-pay

## 2018-01-11 ENCOUNTER — Ambulatory Visit: Payer: Self-pay

## 2018-01-11 NOTE — Patient Outreach (Signed)
[place] [facility]) Care Management  01/11/2018  Markeith Jue 05/16/1950 [id number]  Successful outreach call to Mr. Runnion and HIPAA identifiers verified.   Helped facilitate needed office visit with Sonnie Alamo, FNP at the [facility] for smoking cessation.  His appointment is 12/5 at noon.   Plan: Follow up with Mr. Demarais next week.   Joetta Manners, PharmD Clinical Pharmacist [place] [phone]

## 2018-01-12 ENCOUNTER — Other Ambulatory Visit: Payer: Self-pay | Admitting: *Deleted

## 2018-01-12 DIAGNOSIS — I10 Essential (primary) hypertension: Secondary | ICD-10-CM | POA: Diagnosis not present

## 2018-01-12 DIAGNOSIS — J449 Chronic obstructive pulmonary disease, unspecified: Secondary | ICD-10-CM | POA: Diagnosis not present

## 2018-01-12 NOTE — Patient Outreach (Signed)
[place] [facility]) Care Management  01/12/2018  Leonard [name] May 29, 1950 [id number]  Covering for assigned Health Coach, Joaquim Lai [place]:  RNCM called to follow up and complete assessment. No answer. HIPPA compliant message left.  Plan: Health Coach to continue to attempt to reach client.  Covering RNCM: Thea Silversmith, RN, MSN, [place] Coordinator Cell: [phone]

## 2018-01-16 ENCOUNTER — Ambulatory Visit (INDEPENDENT_AMBULATORY_CARE_PROVIDER_SITE_OTHER): Payer: Medicare Other | Admitting: Podiatry

## 2018-01-16 ENCOUNTER — Ambulatory Visit (INDEPENDENT_AMBULATORY_CARE_PROVIDER_SITE_OTHER): Payer: Medicare Other

## 2018-01-16 DIAGNOSIS — M778 Other enthesopathies, not elsewhere classified: Secondary | ICD-10-CM

## 2018-01-16 DIAGNOSIS — M10471 Other secondary gout, right ankle and foot: Secondary | ICD-10-CM | POA: Diagnosis not present

## 2018-01-16 DIAGNOSIS — M7751 Other enthesopathy of right foot: Secondary | ICD-10-CM | POA: Diagnosis not present

## 2018-01-16 DIAGNOSIS — M779 Enthesopathy, unspecified: Secondary | ICD-10-CM | POA: Diagnosis not present

## 2018-01-16 MED ORDER — COLCHICINE 0.6 MG PO TABS: 0.6000 mg | ORAL_TABLET | Freq: Every day | ORAL | 0 refills | Status: DC

## 2018-01-17 ENCOUNTER — Other Ambulatory Visit: Payer: Self-pay

## 2018-01-17 ENCOUNTER — Ambulatory Visit: Payer: Self-pay

## 2018-01-17 NOTE — Patient Outreach (Signed)
[facility]) Care Management  01/17/2018  Leonard [name] 19-Oct-1950 [id number]  Successful outreach call to [name].  HIPAA identifiers verified.   [name] reports that he saw his PCP last week.  He states that his PCP said his "blood work was good" and that she was a "little worried about my kidneys because I drink so much."  SCr was 2 mg/dL on 12/22/17 in [place].  Patient states he was encouraged to stop drinking and given information about resources in the community to help.  When questioned about how much he drinks, he responds "a lot."  Upon probing, he states he has a small glass of liquor every hour or two, but "I never get drunk."    Patient reports that he is still smoking ~ 8 cigarettes/day, but "I don't inhale."  He states that the PCP did not prescribe anything for smoking cessation, but encouraged him to continue to wean as he has been doing.  I suggested that he make a calendar and taper down his cigarettes and make a goal to stop by the new year.  He states that he is committed to stopping and that he has done it before.   Plan: Outreach to [name] 1 month to see if he has stopped smoking or if he needs pills/patch/gum etc.  Joetta Manners, [place] [phone]

## 2018-01-17 NOTE — Progress Notes (Signed)
   HPI: 67 year old male presenting today with a chief complaint of severe pain to the right great toe that began yesterday. Wearing shoes and walking increases the pain. He has not done anything for treatment. He states he had labs drawn last year to check for gout and were negative. Patient is here for further evaluation and treatment.   Past Medical History:  Diagnosis Date  . Asthma   . CHF (congestive heart failure) ([place])   . Hypertension   . Prostate cancer [facility])      Physical Exam: General: The patient is alert and oriented x3 in no acute distress.  Dermatology: Skin is warm, dry and supple bilateral lower extremities. Negative for open lesions or macerations.  Vascular: Palpable pedal pulses bilaterally. No edema or erythema noted. Capillary refill within normal limits.  Neurological: Epicritic and protective threshold grossly intact bilaterally.   Musculoskeletal Exam: Pain on palpation to the right 1st MPJ with erythema and edema. Range of motion within normal limits to all pedal and ankle joints bilateral. Muscle strength 5/5 in all groups bilateral.   Radiographic Exam:  Normal osseous mineralization. Joint spaces preserved. No fracture/dislocation/boney destruction.    Assessment: 1. 1st MPJ capsulitis right/acute gout   Plan of Care:  1. Patient evaluated. X-Rays reviewed.  2. Injection of 0.5 mLs Celestone Soluspan injected into the 1st MPJ of the right foot.  3. Post op shoe dispensed.  4. Prescription for Colchicine #14 provided to patient.  5. Return to clinic in 3 weeks.   Patient is a Chief Operating Officer at [employer] .       Edrick Kins, DPM [facility]  Dr. Edrick Kins, DPM    [address], [place] [address]                Office [phone]  Fax [phone]

## 2018-01-19 DIAGNOSIS — Z1211 Encounter for screening for malignant neoplasm of colon: Secondary | ICD-10-CM | POA: Diagnosis not present

## 2018-01-19 DIAGNOSIS — R945 Abnormal results of liver function studies: Secondary | ICD-10-CM | POA: Diagnosis not present

## 2018-01-26 DIAGNOSIS — H401122 Primary open-angle glaucoma, left eye, moderate stage: Secondary | ICD-10-CM | POA: Diagnosis not present

## 2018-02-03 ENCOUNTER — Ambulatory Visit: Payer: Self-pay | Admitting: *Deleted

## 2018-02-06 ENCOUNTER — Encounter: Payer: Medicare Other | Admitting: Podiatry

## 2018-02-11 NOTE — Progress Notes (Signed)
This encounter was created in error - please disregard.

## 2018-02-17 ENCOUNTER — Other Ambulatory Visit: Payer: Self-pay

## 2018-02-17 ENCOUNTER — Ambulatory Visit: Payer: Self-pay

## 2018-02-17 NOTE — Patient Outreach (Signed)
[place] [facility]) Care Management  [place] 02/17/2018  Leonard [name] 11-21-1950 [id number]  Reason for call: smoking cessation  Successful outreach to Leonard [name].  HIPAA identifiers verified.  Leonard [name] states that he has weaned down to ~ 3 cigarettes/day and that he is "doing great'.  He reports that he sometimes smells a cigarette and it makes him feel sick.  He states that he has recently been sick with flu-like symptoms and has not had the urge to drink as much.  He states that has still not gotten the "courage or sense" to seek help for his drinking.   He reports that he stopped his amlodipine about the first of the year because he believes it was making him nauseated.  He also reports having a localized rash on his buttocks.  He states that amlodipine is his newest medication, so he relates it to this drug.  He reports that he is taking all of his other medications as prescribed.  Patient says he has a PCP visit in two weeks, but can't tell me the date.  He states that he has it written down. Informed him to make sure and discuss his perceived issues with amlodipine at that visit.   Encouraged him to set a goal to have stopped smoking by my next call in three weeks and to seriously think about contacting one of the community resources his PCP provided to help him stop drinking.  He verbalized understanding and said he would think about it.   Plan: Outreach to PCP to inform her that he has discontinued his amlodipine.  Outreach to patient in 3 weeks to follow up smoking cessation.  Joetta Manners, PharmD Clinical Pharmacist [place] [phone]  Addendum: Leonard [name] with Leonard [name] at K. Strup, FNP's office.  Informed her that Leonard [name] had discontinued amlodipine due to perceived side effects.  She states his next appointment is not in 2 weeks, but is scheduled for 3/5 at 9:15am.  She will follow up with FNP on Monday to see if she wants him to  come in earlier for a BP check.   Requested that she also call me with this information.  Successful outreach to Leonard [name] to update him about my conversation with his PCP's office.  He states that he would like to be seen earlier too.  Plan: Follow up with FNP's office and LeonardArias on Monday.  Joetta Manners, PharmD Clinical Pharmacist [place] [phone]

## 2018-02-27 ENCOUNTER — Other Ambulatory Visit: Payer: Self-pay | Admitting: *Deleted

## 2018-02-27 DIAGNOSIS — Z01818 Encounter for other preprocedural examination: Secondary | ICD-10-CM | POA: Diagnosis not present

## 2018-02-27 DIAGNOSIS — Z1211 Encounter for screening for malignant neoplasm of colon: Secondary | ICD-10-CM | POA: Diagnosis not present

## 2018-02-27 DIAGNOSIS — K635 Polyp of colon: Secondary | ICD-10-CM | POA: Diagnosis not present

## 2018-02-27 NOTE — Patient Outreach (Signed)
[place] [facility]) Care Management  02/27/2018  Leonard [name] 1951/01/30 [id number]   RN Health Coach attempted follow up outreach call to patient.  Patient had just finished his colonoscopy when he answered. RN will call back. Plan: RN will call patient again within 14 days.  [place] Care Management [phone]

## 2018-03-02 ENCOUNTER — Other Ambulatory Visit: Payer: Self-pay | Admitting: *Deleted

## 2018-03-02 NOTE — Patient Outreach (Signed)
[place] [facility]) Care Management  03/02/2018  Leonard [name] [birth year] [id number]   RN Health Coach attempted follow up outreach call to patient.  Patient stated that he was at work. Patient asked to be called back at 10 tomorrow. Plan: RN will call patient again within 14 days.  [place] Care Management [phone]

## 2018-03-03 ENCOUNTER — Other Ambulatory Visit: Payer: Self-pay | Admitting: *Deleted

## 2018-03-07 ENCOUNTER — Other Ambulatory Visit: Payer: Self-pay

## 2018-03-07 NOTE — Patient Outreach (Addendum)
[place] [facility]) Care Management  [place]  03/07/2018  Leonard [name] [birth year] [id number]   Reason for call: Patient reported itching, rash, and nausea to [place] and wanted to investigate potential medication causes.   Unsuccessful telephone call attempt #1 to patient.   Spoke with patient who requests a call back on a Monday, Wednesday, Thursday, or Friday.   Plan:  Noted Joetta Manners, [facility] Pharmacist is working with the patient on smoking cessation and has previously spoken with him about these reactions. I will notify her of his continuing concerns and sign off.    [place] Faster PharmD Candidate Class of 2020  [employer], PharmD, [place] [phone]

## 2018-03-07 NOTE — Patient Outreach (Addendum)
[facility]) Care Management  03/07/2018  Late entry   [name] Dec 05, 1950 [id number]  [place] telephone call to patient.  Hipaa compliance verified. Per patient he had not been weighing self and keeping up with the CHF. Patient stated he was taking his medication as per ordered. He is still currently smoking. Patient does have transportation to and from visits. He is is still currently working some. Patient has agreed to follow up out reach calls and education.  Current Medications:  Current Outpatient Medications  Medication Sig Dispense Refill  . albuterol (PROVENTIL HFA;VENTOLIN HFA) 108 (90 Base) MCG/ACT inhaler Inhale 2 puffs into the lungs every 4 (four) hours as needed for wheezing or shortness of breath. 1 Inhaler 0  . albuterol (PROVENTIL) (2.5 MG/3ML) 0.083% nebulizer solution Take 3 mLs (2.5 mg total) by nebulization every 6 (six) hours as needed for wheezing or shortness of breath. (Patient not taking: Reported on 12/28/2017) 75 mL 2  . amLODipine (NORVASC) 5 MG tablet TK 1 T PO  QD  1  . carvedilol (COREG) 25 MG tablet Take 1 tablet (25 mg total) by mouth 2 (two) times daily. 180 tablet 3  . colchicine 0.6 MG tablet Take 1 tablet (0.6 mg total) by mouth daily. 14 tablet 0  . COMBIGAN 0.2-0.5 % ophthalmic solution Place 1 drop into both eyes daily.  1  . furosemide (LASIX) 40 MG tablet Take 1 tablet (40 mg total) by mouth daily. 90 tablet 3  . latanoprost (XALATAN) 0.005 % ophthalmic solution Place 1 drop into both eyes at bedtime.     . ondansetron (ZOFRAN ODT) 8 MG disintegrating tablet Take 1 tablet (8 mg total) by mouth every 8 (eight) hours as needed for nausea or vomiting. (Patient not taking: Reported on 12/28/2017) 10 tablet 0  . Potassium Chloride 40 MEQ/15ML (20%) SOLN Take 20 mEq by mouth daily. 473 mL 2  . SYMBICORT 160-4.5 MCG/ACT inhaler Inhale 1 puff into the lungs daily.  3   No current facility-administered medications for this  visit.     Functional Status:  In your present state of health, do you have any difficulty performing the following activities: 03/03/2018 12/29/2017  Hearing? N N  Vision? N N  Difficulty concentrating or making decisions? N N  Walking or climbing stairs? N N  Dressing or bathing? N N  Doing errands, shopping? N N  Preparing Food and eating ? N N  Using the Toilet? N N  In the past six months, have you accidently leaked urine? N N  Do you have problems with loss of bowel control? N N  Managing your Medications? N N  Managing your Finances? N N  Housekeeping or managing your Housekeeping? N N  Some recent data might be hidden    Fall/Depression Screening: Fall Risk  03/03/2018 12/29/2017 12/28/2017  Falls in the past year? 0 0 0  Number falls in past yr: - 0 -  Injury with Fall? - 0 -  Follow up Falls evaluation completed - -   PHQ 2/9 Scores 03/03/2018 12/29/2017 12/28/2017 06/04/2016 04/14/2016 03/10/2016 02/18/2016  PHQ - 2 Score 0 1 1 0 0 0 0    Assessment:  Patient does not weigh Patient does not remember the zones and action plan of CHF Patient has a rash, itching and nauseated with medications Patient will benefit from [place] telephonic outreach for education and support for CHF self management.  Plan:  RN sent 2020 Calendar book for  documentation RN discussed daily weights RN discussed documenting weights RN discussed Zones and action plan of CHF RN sent CHF packet Referred to pharmacy RN sent assessment and barrier letter to PCP RN will follow up within the month of March  Amour Trigg [place] Management [phone]

## 2018-03-08 ENCOUNTER — Telehealth: Payer: Self-pay | Admitting: Interventional Cardiology

## 2018-03-08 ENCOUNTER — Other Ambulatory Visit: Payer: Self-pay

## 2018-03-08 NOTE — Telephone Encounter (Signed)
New message   Pt c/o medication issue:  1. Name of Medication: carvedilol (COREG) 25 MG tablet (Expired)  2. How are you currently taking this medication (dosage and times per day)? Unknown   3. Are you having a reaction (difficulty breathing--STAT)? no  4. What is your medication issue? Rachael Pharmacists from [facility] stated that pt PCP prescribed medication with different dosage than Dr. Irish Lack. She needs to confirm dosage

## 2018-03-08 NOTE — Patient Outreach (Addendum)
[facility]) Care Management  [place] 03/08/2018  Leonard [name] [birth year] [id number]  Reason for call: Follow up call to discuss patient-reported side effects of medications. Patient states amlodipine caused nausea and a rash localized to his butt. He has stopped taking amlodipine, and the nausea and rash have resolved. He also reports isosorbide and furosemide cause an "itching sensation," but he reports he is still taking these.  Leonard [name] reports that he is down to one cigarette/day, but hasn't changed his drinking habits.  Care coordination to [facility] to verify medications. Findings:  Isosorbide 10 mg 1 tab BID #90, prescribed by Leonard [name], last picked up 10/18/2017  Furosemide 40mg  daily, prescribed by Leonard [name], last picked up 01/12/18  Carvedilol 25mg  daily, prescribed by Leonard [name], last picked up 01/12/2018. Leonard [name] has BID dose written, but this was not what was most recently picked up  Rosuvastatin 10mg  daily, last picked up 10/2017  Care coordination to Leonard [name] office to get patient an appointment on 03/22/2018 at 8:30 with Leonard Perch, PA-C at the [facility] location.  Informed patient of appointment and he verbalized understanding.  Call received from cardiology office,  informed nurse that patient is now taking carvedilol 25 mg once daily as prescribed by PCP in December.  I have asked patient to bring all his medications to his PCP and cardiology appointments for review.   Care coordination to [facility] to get an earlier appointment than 04/13/2018. Rescheduled to 03/13/2018 at 9 am.  Plan: Outreach to Leonard [name] after cardiology visit to see if he has medication questions.   Denver Faster PharmD Candidate Class of 2020  [employer], [place] [phone]

## 2018-03-08 NOTE — Telephone Encounter (Signed)
Returned call to pharmacist at [facility]. She states that the patient's PCP recently changed the patient's Rx for carvedilol to 25 mg QD instead of BID. She was calling to clarify dosing. Patient was last seen by cardiology in 11/2017 and was instructed to continue carvedilol 25 mg BID. I made the p[ahramcist aware that I am not sure why the patient's PCP sent in Rx that way or what the reasoning behind that was. Instructed her to reach out to PCP for additional information. She states that she will and asks that we review dose at next OV on 2/12.

## 2018-03-22 ENCOUNTER — Ambulatory Visit: Payer: Medicare Other | Admitting: Cardiology

## 2018-03-22 NOTE — Progress Notes (Deleted)
Cardiology Office Note:    Date:  03/22/2018   ID:  Leonard [name], DOB 04-14-1950, MRN [id number]  PCP:  Lucianne Lei, MD  Cardiologist:  [name], MD  Referring MD: Lucianne Lei, MD   No chief complaint on file. ***  History of Present Illness:    Leonard [name] is a 68 y.o. male with a past medical history significant for hypertension, hypertensive heart disease, nonischemic cardiomyopathy, tobacco abuse, who had an abnormal echo with EF of 40 to 45% and grade 1 DD followed by cardiac catheterization 06/2016 found to have 40% proximal RCA, 25% distal RCA, 20% mid LAD moderate LV systolic dysfunction ejection fraction 35 to 45% normal pulmonary artery pressures.Good blood pressure control was stressed. Patient has had a cough with ACE inhibitors in the past and a rash with losartan.  Follow-up 2D echo 02/2017 showed further decrease in LV function EF now 30 to 35% with diffuse hypokinesis and grade 1 DD,also had moderate AI and mild MR. Patient had been noncompliant with his medications.CT angiogram 02/28/2017 showed an acute pulmonary embolism in the right middle lobe and mild aneurysmal dilatation of the ascending thoracic aorta measuring 4.0 cm. We did not see him that hospitalization. He was sent home on Xarelto for 6 months. In the emergency room 08/10/2017 with COPD exacerbation.  Seen in the office in September by Estella Husk, PA at which time he was having a heart failure exacerbation after running out of medications for about 2 months.  His medications were resumed initial extra Lasix.  He was seen back in the office in October and was still having shortness of breath.  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     ?carvedolol daily per PCP, BID per [place]  [facility] labs  Past Medical History:  Diagnosis Date  . Asthma   . CHF (congestive heart failure) ([place])   . Hypertension   . Prostate cancer [facility])     Past  Surgical History:  Procedure Laterality Date  . APPENDECTOMY    . HYDROCELE EXCISION Left 12/01/2015   Procedure: REPAIR OF LEFT HYDROCELE;  Surgeon: Raynelle Bring, MD;  Location: [facility];  Service: Urology;  Laterality: Left;  . LYMPHADENECTOMY Bilateral 09/24/2013   Procedure: LYMPHADENECTOMY;  Surgeon: Raynelle Bring, MD;  Location: [facility];  Service: Urology;  Laterality: Bilateral;  . PROSTATE BIOPSY    . RIGHT/LEFT HEART CATH AND CORONARY ANGIOGRAPHY N/A 06/10/2016   Procedure: Right/Left Heart Cath and Coronary Angiography;  Surgeon: Jettie Booze, MD;  Location: [place] CV LAB;  Service: Cardiovascular;  Laterality: N/A;  . ROBOT ASSISTED LAPAROSCOPIC RADICAL PROSTATECTOMY N/A 09/24/2013   Procedure: ROBOTIC ASSISTED LAPAROSCOPIC RADICAL PROSTATECTOMY LEVEL 2;  Surgeon: Raynelle Bring, MD;  Location: [facility];  Service: Urology;  Laterality: N/A;    Current Medications: No outpatient medications have been marked as taking for the 03/22/18 encounter (Appointment) with Daune Perch, NP.     Allergies:   Losartan   Social History   Socioeconomic History  . Marital status: Single    Spouse name: Not on file  . Number of children: Not on file  . Years of education: Not on file  . Highest education level: Not on file  Occupational History  . 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 Financial resource strain: Not hard at all  . Food  insecurity:    Worry: Never true    Inability: Never true  . Transportation needs:    Medical: No    Non-medical: No  Tobacco Use  . Smoking status: Current Every Day Smoker    Packs/day: 0.25    Years: 30.00    Pack years: 7.50    Types: Cigarettes    Last attempt to quit: 12/24/2015    Years since quitting: 2.2  . Smokeless tobacco: Never Used  . Tobacco comment: STtses he has cut down from 2 PPD to 4 cigarettes per day  Substance and Sexual Activity  . Alcohol  use: Yes    Alcohol/week: 6.0 - 7.0 standard drinks    Types: 6 - 7 Shots of liquor per week    Comment: states 6-7 shots of liquor daily  . Drug use: No    Types: Marijuana  . Sexual activity: Not Currently  Lifestyle  . Physical activity:    Days per week: Not on file    Minutes per session: Not on file  . Stress: Not on file  Relationships  . Social connections:    Talks on phone: Not on file    Gets together: Not on file    Attends religious service: Not on file    Active member of club or organization: Not on file    Attends meetings of clubs or organizations: Not on file    Relationship status: Not on file  Other Topics Concern  . Not on file  Social History Narrative  . Not on file     Family History: The patient's ***family history includes Hypertension in his sister. There is no history of Cancer. ROS:   Please see the history of present illness.    *** All other systems reviewed and are negative.  EKGs/Labs/Other Studies Reviewed:    The following studies were reviewed today:  2D echo 02/24/2017 Study Conclusions  - Left ventricle: The cavity size was mildly dilated. There was mild concentric hypertrophy. Systolic function was moderately to severely reduced. The estimated ejection fraction was in the range of 30% to 35%. Diffuse hypokinesis. Doppler parameters are consistent with abnormal left ventricular relaxation (grade 1 diastolic dysfunction). There was no evidence of elevated ventricular filling pressure by Doppler parameters. - Aortic valve: There was moderate regurgitation. - Mitral valve: There was mild regurgitation. - Left atrium: The atrium was moderately dilated. - Right ventricle: The cavity size was normal. Wall thickness was normal. Systolic function was normal. - Right atrium: The atrium was normal in size. - Tricuspid valve: There was trivial regurgitation. - Pulmonary arteries: Systolic pressure was within the  normal range. - Inferior vena cava: The vessel was normal in size. - Pericardium, extracardiac: There was no pericardial effusion.  Impressions: - Since the last study on 05/06/16 LVEF has further decreased from 40-45% to 30-25% with diffuse hypokinesis.  Cardiac catheterization 06/2016  Prox RCA lesion, 40 %stenosed.  Dist RCA lesion, 25 %stenosed.  Ramus lesion, 25 %stenosed.  Mid LAD lesion, 20 %stenosed.  There is moderate left ventricular systolic dysfunction.  LV end diastolic pressure is mildly elevated.  The left ventricular ejection fraction is 35-45% by visual estimate.  There is no aortic valve stenosis.  Ao sat 93%, PA sat 61%. CO 4.5 L/min. CI 2.2. Normal pulmonary artery pressures.  No AAA. Mild left renal artery stenosis. No right renal artery stenosis.  Nonobstructive coronary artery disease. Patient with nonischemic cardiomyopathy, likely related to elevated blood pressure.  He needs  aggressive BP control. Increase coreg to 6.25 mg BID. Will have him f/u in our HTN clinic with electrolytes to be checked.  EKG:  EKG is *** ordered today.  The ekg ordered today demonstrates ***  Recent Labs: 12/22/2017: ALT 30; BUN 18; Creatinine, Ser 2.00; Hemoglobin 15.1; Platelets 245; Potassium 3.6; Sodium 139   Recent Lipid Panel    Component Value Date/Time   CHOL 183 10/18/2017 0843   TRIG 79 10/18/2017 0843   HDL 84 10/18/2017 0843   CHOLHDL 2.2 10/18/2017 0843   LDLCALC 83 10/18/2017 0843    Physical Exam:    VS:  There were no vitals taken for this visit.    Wt Readings from Last 3 Encounters:  12/22/17 180 lb (81.6 kg)  11/09/17 182 lb (82.6 kg)  10/18/17 177 lb 6.4 oz (80.5 kg)     Physical Exam***   ASSESSMENT:    1. Nonischemic cardiomyopathy ([place])   2. Chronic systolic heart failure ([place])   3. Hypertensive heart disease with chronic systolic congestive heart failure (HCC)    PLAN:    In order of problems listed  above:  NonIschemic cardiomyopathy -EF 30-35% per echo August 2019 -Nonobstructive CAD on cath in 8811  Chronic systolic CHF -On carvedilol 25 mg twice daily (was sent in as daily dosing by his PCP in December, unknown reason), Lasix 40 mg daily (Not on ACEI due to cough, losartan due to rash)   Hypertension -On carvedilol, ?  Amlodipine (?stopped due to perceived reaction  History of pulmonary embolus 02/2017 -Was on anticoagulation for 6 months which is now discontinued   Medication Adjustments/Labs and Tests Ordered: Current medicines are reviewed at length with the patient today.  Concerns regarding medicines are outlined above. Labs and tests ordered and medication changes are outlined in the patient instructions below:  There are no Patient Instructions on file for this visit.   Signed, Daune Perch, NP  03/22/2018 4:48 AM    [place]

## 2018-03-23 ENCOUNTER — Ambulatory Visit: Payer: Self-pay

## 2018-03-23 ENCOUNTER — Other Ambulatory Visit: Payer: Self-pay

## 2018-03-23 NOTE — Patient Outreach (Signed)
[facility]) Care Management  03/23/2018  Leonard [name] [birth year] [id number]   Successful outreach to Leonard [name].  HIPAA identifiers verified.   Leonard [name] states that he had to cancel his cardiology appointment that was scheduled for 2/12 because he "had other business to take care of".   Helped him reschedule appointment for 3/18 at 0900.  Counseled him on the importance of keeping his appointments.  Patient reports that he is still smoking 2-3 cigarettes/day, but states, "I don't inhale" and reports that he does not like to smell cigarettes anymore.  Again, counseled him on the importance of quitting.    Leonard [name] reports that he does not have any medication questions or concerns a this time.  Informed him that I will close [place] case.  He has my phone number and he is aware that he can call me in the future if medications issues arise.   Plan: Route closure letter to PCP, Dr Criss Rosales.  Joetta Manners, PharmD Clinical Pharmacist [place] [phone]

## 2018-03-24 ENCOUNTER — Encounter: Payer: Self-pay | Admitting: Cardiology

## 2018-04-25 ENCOUNTER — Telehealth: Payer: Self-pay | Admitting: Physician Assistant

## 2018-04-25 ENCOUNTER — Telehealth: Payer: Self-pay | Admitting: Internal Medicine

## 2018-04-25 MED ORDER — FUROSEMIDE 40 MG PO TABS: 40.0000 mg | ORAL_TABLET | Freq: Every day | ORAL | 1 refills | Status: DC

## 2018-04-25 MED ORDER — POTASSIUM CHLORIDE 40 MEQ/15ML (20%) PO SOLN: 20.0000 meq | Freq: Every day | ORAL | 2 refills | Status: DC

## 2018-04-25 MED ORDER — CARVEDILOL 25 MG PO TABS: 25.0000 mg | ORAL_TABLET | Freq: Two times a day (BID) | ORAL | 1 refills | Status: DC

## 2018-04-25 MED ORDER — AMLODIPINE BESYLATE 5 MG PO TABS: ORAL_TABLET | ORAL | 1 refills | Status: AC

## 2018-04-25 NOTE — Addendum Note (Signed)
Addended by: Drue Novel I on: 04/25/2018 02:47 PM   Modules accepted: Orders

## 2018-04-25 NOTE — Telephone Encounter (Signed)
I spoke with patient who complains of recent increase in leg swelling and hand swelling.  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  I talked to him about his upcoming appointment tomorrow and he says his swelling is down today and he will be more vigilant about taking his Lasix daily and watching his salt intake.  Because of the coronavirus he is willing to reschedule his appointment.  We will provide refills for all his cardiac medications and I encouraged him to call his PCP or pulmonary physician to refill his inhalers.  He is to call if he has any further symptoms or swelling.

## 2018-04-25 NOTE — Telephone Encounter (Signed)
Left msg on VM for patient to call to reschedule 4 month follow up WIll forward to pool to call back/reschedule

## 2018-04-25 NOTE — Telephone Encounter (Signed)
Refills sent in. Phone note has already been sent to [facility].

## 2018-04-26 ENCOUNTER — Ambulatory Visit: Payer: Medicare Other | Admitting: Physician Assistant

## 2018-04-27 DIAGNOSIS — D126 Benign neoplasm of colon, unspecified: Secondary | ICD-10-CM | POA: Diagnosis not present

## 2018-04-27 DIAGNOSIS — K648 Other hemorrhoids: Secondary | ICD-10-CM | POA: Diagnosis not present

## 2018-05-01 ENCOUNTER — Other Ambulatory Visit: Payer: Self-pay | Admitting: *Deleted

## 2018-05-01 NOTE — Patient Outreach (Signed)
[facility] [facility]) Care Management  05/01/2018   Leonard [name] [birth year] [id number]  RN Health Coach telephone call to patient.  Hipaa compliance verified. Per patient he is doing great. Patient is not weighing every day. Per patient he can tell when he is retaining fluid due to his ankles and hands get tight. Patient stated then he takes an extra fluid pill.  Per patient he is taking his medications as prescribed. Patient is trying to  monitoring his diet for sodium intake and has been doing some reading on his own. Patient has agreed to follow up outreach calls.   Current Medications:  Current Outpatient Medications  Medication Sig Dispense Refill  . albuterol (PROVENTIL HFA;VENTOLIN HFA) 108 (90 Base) MCG/ACT inhaler Inhale 2 puffs into the lungs every 4 (four) hours as needed for wheezing or shortness of breath. 1 Inhaler 0  . albuterol (PROVENTIL) (2.5 MG/3ML) 0.083% nebulizer solution Take 3 mLs (2.5 mg total) by nebulization every 6 (six) hours as needed for wheezing or shortness of breath. (Patient not taking: Reported on 12/28/2017) 75 mL 2  . amLODipine (NORVASC) 5 MG tablet Take 1 tablet by mouth daily 90 tablet 1  . carvedilol (COREG) 25 MG tablet Take 1 tablet (25 mg total) by mouth 2 (two) times daily. 180 tablet 1  . colchicine 0.6 MG tablet Take 1 tablet (0.6 mg total) by mouth daily. 14 tablet 0  . COMBIGAN 0.2-0.5 % ophthalmic solution Place 1 drop into both eyes daily.  1  . furosemide (LASIX) 40 MG tablet Take 1 tablet (40 mg total) by mouth daily. 90 tablet 1  . latanoprost (XALATAN) 0.005 % ophthalmic solution Place 1 drop into both eyes at bedtime.     . ondansetron (ZOFRAN ODT) 8 MG disintegrating tablet Take 1 tablet (8 mg total) by mouth every 8 (eight) hours as needed for nausea or vomiting. (Patient not taking: Reported on 12/28/2017) 10 tablet 0  . Potassium Chloride 40 MEQ/15ML (20%) SOLN Take 20 mEq by mouth daily. 473 mL 2  . SYMBICORT 160-4.5  MCG/ACT inhaler Inhale 1 puff into the lungs daily.  3   No current facility-administered medications for this visit.     Functional Status:  In your present state of health, do you have any difficulty performing the following activities: 03/03/2018 12/29/2017  Hearing? N N  Vision? N N  Difficulty concentrating or making decisions? N N  Walking or climbing stairs? N N  Dressing or bathing? N N  Doing errands, shopping? N N  Preparing Food and eating ? N N  Using the Toilet? N N  In the past six months, have you accidently leaked urine? N N  Do you have problems with loss of bowel control? N N  Managing your Medications? N N  Managing your Finances? N N  Housekeeping or managing your Housekeeping? N N  Some recent data might be hidden    Fall/Depression Screening: Fall Risk  05/01/2018 03/03/2018 12/29/2017  Falls in the past year? 0 0 0  Number falls in past yr: - - 0  Injury with Fall? - - 0  Follow up Falls evaluation completed Falls evaluation completed -   PHQ 2/9 Scores 05/01/2018 03/03/2018 12/29/2017 12/28/2017 06/04/2016 04/14/2016 03/10/2016  PHQ - 2 Score 0 0 1 1 0 0 0   THN CM Care Plan Problem One     Most Recent Value  Care Plan for Problem One  Active  THN Long Term Goal   Patient  will not have any admissions for CHF within the next 90 days  THN Long Term Goal Start Date  05/01/18  Interventions for Problem One Long Term Goal  RN reiterated CHF zones and action plan. RN reiterated the patient to check daily weights. RN will follow up with further discussion  THN CM Short Term Goal #1   Patient will verbalize understanding symptoms of CHF within the next 30 days  THN CM Short Term Goal #1 Start Date  05/01/18  Interventions for Short Term Goal #1  RN reiterated the symptoms of CHF. RN reiterated medication adherence. RN will follow up for further discussion   THN CM Short Term Goal #2   Patient will verbalize weighing daily and documenting within the next 30 days  THN  CM Short Term Goal #2 Start Date  05/01/18  Interventions for Short Term Goal #2  Rn reiterated the importance of weighing. Patient states that he can feel when he is retaining fluid. RN explained that weighing helps you become aware as the fluid is starting to retain. RN will follow up with further discussion  THN CM Short Term Goal #3  Patient will have a better understanding of low sodium diet within the next 30 days  THN CM Short Term Goal #3 Start Date  05/01/18  Interventions for Short Tern Goal #3  RN discussed the importance of limiting salt. RN sent a picture chart of low and high sodium foods. RN sent educational material on how to read the food labels. RN sent the dash diet. RN will follow up with further discussion      Assessment:  Patient is monitoring his diet better Patient is taking medication as prescribed Patient knows his diuretics and cardiac medications Patient does not weigh daily  Plan: RN discussed medication adherence RN discussed daily weights RN discussed zones of CHF  RN sent clinical Key educational material on Dash Diet RN sent clinical Key educational material on low sodium eating plan RN sent clinical Key educational material on Cooking with less salt RN sent picture sheet on how to read food labels RN sent clinical Key educational material on Heart Failure eating plan  [place] Management [phone]

## 2018-05-03 ENCOUNTER — Other Ambulatory Visit: Payer: Self-pay

## 2018-05-03 ENCOUNTER — Emergency Department (HOSPITAL_COMMUNITY)
Admission: EM | Admit: 2018-05-03 | Discharge: 2018-05-03 | Disposition: A | Discharge status: Home / Self Care | Payer: Medicare Other | Attending: Emergency Medicine | Admitting: Emergency Medicine

## 2018-05-03 ENCOUNTER — Encounter (HOSPITAL_COMMUNITY): Payer: Self-pay | Admitting: Obstetrics and Gynecology

## 2018-05-03 DIAGNOSIS — I130 Hypertensive heart and chronic kidney disease with heart failure and stage 1 through stage 4 chronic kidney disease, or unspecified chronic kidney disease: Secondary | ICD-10-CM | POA: Insufficient documentation

## 2018-05-03 DIAGNOSIS — R110 Nausea: Secondary | ICD-10-CM | POA: Diagnosis not present

## 2018-05-03 DIAGNOSIS — R197 Diarrhea, unspecified: Secondary | ICD-10-CM | POA: Insufficient documentation

## 2018-05-03 DIAGNOSIS — R1111 Vomiting without nausea: Secondary | ICD-10-CM | POA: Diagnosis not present

## 2018-05-03 DIAGNOSIS — F17210 Nicotine dependence, cigarettes, uncomplicated: Secondary | ICD-10-CM | POA: Insufficient documentation

## 2018-05-03 DIAGNOSIS — N182 Chronic kidney disease, stage 2 (mild): Secondary | ICD-10-CM | POA: Insufficient documentation

## 2018-05-03 DIAGNOSIS — I10 Essential (primary) hypertension: Secondary | ICD-10-CM | POA: Diagnosis not present

## 2018-05-03 DIAGNOSIS — R0602 Shortness of breath: Secondary | ICD-10-CM | POA: Diagnosis not present

## 2018-05-03 DIAGNOSIS — J45909 Unspecified asthma, uncomplicated: Secondary | ICD-10-CM | POA: Insufficient documentation

## 2018-05-03 DIAGNOSIS — Z79899 Other long term (current) drug therapy: Secondary | ICD-10-CM | POA: Insufficient documentation

## 2018-05-03 DIAGNOSIS — R109 Unspecified abdominal pain: Secondary | ICD-10-CM | POA: Diagnosis present

## 2018-05-03 DIAGNOSIS — I509 Heart failure, unspecified: Secondary | ICD-10-CM | POA: Diagnosis not present

## 2018-05-03 LAB — COMPREHENSIVE METABOLIC PANEL
ALT: 41 U/L (ref 0–44)
AST: 76 U/L — ABNORMAL HIGH (ref 15–41)
Albumin: 4.6 g/dL (ref 3.5–5.0)
Alkaline Phosphatase: 66 U/L (ref 38–126)
Anion gap: 11 (ref 5–15)
BUN: 12 mg/dL (ref 8–23)
CO2: 25 mmol/L (ref 22–32)
Calcium: 9.3 mg/dL (ref 8.9–10.3)
Chloride: 104 mmol/L (ref 98–111)
Creatinine, Ser: 1.02 mg/dL (ref 0.61–1.24)
GFR calc Af Amer: >60 mL/min (ref >60)
GFR calc non Af Amer: >60 mL/min (ref >60)
Glucose, Bld: 123 mg/dL — ABNORMAL HIGH (ref 70–99)
Potassium: 3.6 mmol/L (ref 3.5–5.1)
Sodium: 140 mmol/L (ref 135–145)
Total Bilirubin: 2.2 mg/dL — ABNORMAL HIGH (ref 0.3–1.2)
Total Protein: 8.2 g/dL — ABNORMAL HIGH (ref 6.5–8.1)

## 2018-05-03 LAB — URINALYSIS, ROUTINE W REFLEX MICROSCOPIC
Bacteria, UA: NONE SEEN
Bilirubin Urine: NEGATIVE
Glucose, UA: NEGATIVE mg/dL
Ketones, ur: NEGATIVE mg/dL
Leukocytes,Ua: NEGATIVE
Nitrite: NEGATIVE
Protein, ur: NEGATIVE mg/dL
Specific Gravity, Urine: 1.005 (ref 1.005–1.030)
pH: 5 (ref 5.0–8.0)

## 2018-05-03 LAB — CBC WITH DIFFERENTIAL/PLATELET
Abs Immature Granulocytes: 0.01 10*3/uL (ref 0.00–0.07)
Basophils Absolute: 0 10*3/uL (ref 0.0–0.1)
Basophils Relative: 0 %
Eosinophils Absolute: 0.2 10*3/uL (ref 0.0–0.5)
Eosinophils Relative: 2 %
HCT: 43.4 % (ref 39.0–52.0)
Hemoglobin: 14.8 g/dL (ref 13.0–17.0)
Immature Granulocytes: 0 %
Lymphocytes Relative: 13 %
Lymphs Abs: 0.9 10*3/uL (ref 0.7–4.0)
MCH: 33 pg (ref 26.0–34.0)
MCHC: 34.1 g/dL (ref 30.0–36.0)
MCV: 96.9 fL (ref 80.0–100.0)
Monocytes Absolute: 0.5 10*3/uL (ref 0.1–1.0)
Monocytes Relative: 7 %
Neutro Abs: 5.3 10*3/uL (ref 1.7–7.7)
Neutrophils Relative %: 78 %
Platelets: 194 10*3/uL (ref 150–400)
RBC: 4.48 MIL/uL (ref 4.22–5.81)
RDW: 12.7 % (ref 11.5–15.5)
WBC: 6.8 10*3/uL (ref 4.0–10.5)
nRBC: 0 % (ref 0.0–0.2)

## 2018-05-03 LAB — POC OCCULT BLOOD, ED: Fecal Occult Bld: NEGATIVE

## 2018-05-03 LAB — LIPASE, BLOOD: Lipase: 30 U/L (ref 11–51)

## 2018-05-03 MED ORDER — ONDANSETRON 4 MG PO TBDP: 4.0000 mg | ORAL_TABLET | Freq: Three times a day (TID) | ORAL | 0 refills | Status: DC | PRN

## 2018-05-03 NOTE — ED Provider Notes (Signed)
[facility] DEPT Provider Note   CSN: [id number] Arrival date & time: 05/03/18  1630    History   Chief Complaint Chief Complaint  Patient presents with  . Abdominal Pain    HPI Leonard [name] is a 68 y.o. male with h/o HTN, nonischemic CM EF 30-35%, tobacco use, non obstructive CAD, AI, PE, stable ascending thoracic aorta aneurysm, COPD, daily ETOH use, prostate cancer s/p prostatectomy is here for evaluation of diarrhea.  Onset suddenly at 8 AM.  Described as dark, loose, "black" x4-5 times.  Associated with crampy, mild periumbilical abdominal pain initially was constant but now significantly improved and minimal at 2/10.  Also had transient, resolved lightheadedness, nausea, nonbilious nonbloody emesis.  States every time he vomited he felt better.  Had some shortness of breath after episodes of forceful vomiting.  At baseline he has orthopnea and exertional dyspnea that he attributes to his congestive heart failure, this is unchanged.  Currently feels a lot better and is asking for something to drink.  He denies any fever, chest pain, cough, sick contacts, recent travel, changes to chronic dyspnea.  He cooked ribs yesterday and some of his friends ate it as well but did not have any symptoms.  History of appendectomy.  He drinks 4-5 whiskey drinks a day, last drink yesterday.  No interventions.  No aggravating factors.  No h/o pancreatitis, PUD, GIB. No sick contacts.     HPI  Past Medical History:  Diagnosis Date  . Asthma   . CHF (congestive heart failure) ([place])   . Hypertension   . Prostate cancer [facility])     Patient Active Problem List   Diagnosis Date Noted  . Alcohol abuse 12/28/2017  . Glaucoma 12/28/2017  . Nonischemic cardiomyopathy ([place]) 10/17/2017  . History of pulmonary embolus (PE) 10/17/2017  . Pulmonary embolism ([place]) 02/28/2017  . Acute exacerbation of congestive heart failure ([place]) 02/24/2017  . CHF exacerbation ([place]) 02/24/2017   . Hypertensive urgency 02/24/2017  . Pulmonary edema 02/24/2017  . Acute kidney injury superimposed on chronic kidney disease ([place]) 02/24/2017  . Hypertensive heart disease 09/27/2016  . Tobacco abuse 09/27/2016  . Mixed hyperlipidemia 09/27/2016  . History of tobacco abuse 06/25/2016  . Allergic sinusitis 04/17/2016  . Asthma 01/19/2016  . CKD (chronic kidney disease) stage 2, GFR 60-89 ml/min 01/19/2016  . Aspiration pneumonitis ([place])   . Essential hypertension   . Heart failure with reduced ejection fraction ([place]) 01/10/2016  . Malignant neoplasm of prostate ([place]) 07/03/2013    Past Surgical History:  Procedure Laterality Date  . APPENDECTOMY    . HYDROCELE EXCISION Left 12/01/2015   Procedure: REPAIR OF LEFT HYDROCELE;  Surgeon: Raynelle Bring, MD;  Location: [facility];  Service: Urology;  Laterality: Left;  . LYMPHADENECTOMY Bilateral 09/24/2013   Procedure: LYMPHADENECTOMY;  Surgeon: Raynelle Bring, MD;  Location: [facility];  Service: Urology;  Laterality: Bilateral;  . PROSTATE BIOPSY    . RIGHT/LEFT HEART CATH AND CORONARY ANGIOGRAPHY N/A 06/10/2016   Procedure: Right/Left Heart Cath and Coronary Angiography;  Surgeon: Jettie Booze, MD;  Location: [facility];  Service: Cardiovascular;  Laterality: N/A;  . ROBOT ASSISTED LAPAROSCOPIC RADICAL PROSTATECTOMY N/A 09/24/2013   Procedure: ROBOTIC ASSISTED LAPAROSCOPIC RADICAL PROSTATECTOMY LEVEL 2;  Surgeon: Raynelle Bring, MD;  Location: [facility];  Service: Urology;  Laterality: N/A;        Home Medications    Prior to Admission medications   Medication Sig Start Date End Date Taking? Authorizing  Provider  albuterol (PROVENTIL HFA;VENTOLIN HFA) 108 (90 Base) MCG/ACT inhaler Inhale 2 puffs into the lungs every 4 (four) hours as needed for wheezing or shortness of breath. 08/10/17   Horton, Barbette Hair, MD  albuterol (PROVENTIL) (2.5 MG/3ML) 0.083% nebulizer solution Take 3 mLs (2.5 mg total) by nebulization every 6 (six) hours as  needed for wheezing or shortness of breath. Patient not taking: Reported on 12/28/2017 01/19/16   Milagros Loll, MD  amLODipine [facility]) 5 MG tablet Take 1 tablet by mouth daily 04/25/18   Imogene Burn, PA-C  carvedilol (COREG) 25 MG tablet Take 1 tablet (25 mg total) by mouth 2 (two) times daily. 04/25/18   Imogene Burn, PA-C  colchicine 0.6 MG tablet Take 1 tablet (0.6 mg total) by mouth daily. 01/16/18   Edrick Kins, DPM  COMBIGAN 0.2-0.5 % ophthalmic solution Place 1 drop into both eyes daily. 07/15/16   [provider]  furosemide (LASIX) 40 MG tablet Take 1 tablet (40 mg total) by mouth daily. 04/25/18   Imogene Burn, PA-C  latanoprost (XALATAN) 0.005 % ophthalmic solution Place 1 drop into both eyes at bedtime.  03/04/16   [provider]  ondansetron (ZOFRAN ODT) 4 MG disintegrating tablet Take 1 tablet (4 mg total) by mouth every 8 (eight) hours as needed for nausea or vomiting. 05/03/18   Kinnie Feil, PA-C  Potassium Chloride 40 MEQ/15ML (20%) SOLN Take 20 mEq by mouth daily. 04/25/18   Imogene Burn, PA-C  SYMBICORT 160-4.5 MCG/ACT inhaler Inhale 1 puff into the lungs daily. 12/08/17   [provider]    Family History Family History  Problem Relation Age of Onset  . Hypertension Sister   . Cancer Neg Hx     Social History Social History   Tobacco Use  . Smoking status: Current Every Day Smoker    Packs/day: 0.25    Years: 30.00    Pack years: 7.50    Types: Cigarettes    Last attempt to quit: 12/24/2015    Years since quitting: 2.3  . Smokeless tobacco: Never Used  . Tobacco comment: STtses he has cut down from 2 PPD to 4 cigarettes per day  Substance Use Topics  . Alcohol use: Yes    Alcohol/week: 6.0 - 7.0 standard drinks    Types: 6 - 7 Shots of liquor per week    Comment: states 6-7 shots of liquor daily  . Drug use: No    Types: Marijuana     Allergies   Losartan   Review of Systems Review of Systems   Respiratory: Positive for shortness of breath (chronic).   Gastrointestinal: Positive for abdominal pain, diarrhea, nausea and vomiting.  All other systems reviewed and are negative.    Physical Exam Updated Vital Signs BP (!) 172/84   Pulse 81   Temp 98.1 F (36.7 C) (Oral)   Resp 14   Ht 5\' 7"  (1.702 m)   Wt 81.6 kg   SpO2 96%   BMI 28.19 kg/m   Physical Exam Vitals signs and nursing note reviewed.  Constitutional:      Appearance: He is well-developed.     Comments: Well appearing, in good spirits. Pleasant.   HENT:     Head: Normocephalic and atraumatic.     Nose: Nose normal.     Mouth/Throat:     Comments: MMM Eyes:     Conjunctiva/sclera: Conjunctivae normal.     Pupils: Pupils are equal, round, and  reactive to light.  Neck:     Musculoskeletal: Normal range of motion.  Cardiovascular:     Rate and Rhythm: Normal rate and regular rhythm.     Heart sounds: Normal heart sounds.     Comments: 1+ radial and DP pulses bilaterally. No LE edema.  Pulmonary:     Effort: Pulmonary effort is normal.     Breath sounds: Normal breath sounds.  Abdominal:     General: Bowel sounds are normal.     Palpations: Abdomen is soft.     Tenderness: There is no abdominal tenderness.     Comments: No G/R/R. No suprapubic or CVA tenderness. Negative Murphy's and McBurney's  Genitourinary:    Rectum: Guaiac result negative.     Comments:  Chaperone was present. There are no external fissures or external hemorrhoids noted. No induration or swelling of the perianal skin.  Stool color is light brown with no gross blood noted. Negative hemoccult. DRE reveals good sphincter tone. Musculoskeletal: Normal range of motion.  Skin:    General: Skin is warm and dry.     Capillary Refill: Capillary refill takes less than 2 seconds.  Neurological:     Mental Status: He is alert and oriented to person, place, and time.  Psychiatric:        Behavior: Behavior normal.        Thought Content:  Thought content normal.        Judgment: Judgment normal.      ED Treatments / Results  Labs (all labs ordered are listed, but only abnormal results are displayed) Labs Reviewed  COMPREHENSIVE METABOLIC PANEL - Abnormal; Notable for the following components:      Result Value   Glucose, Bld 123 (*)    Total Protein 8.2 (*)    AST 76 (*)    Total Bilirubin 2.2 (*)    All other components within normal limits  URINALYSIS, ROUTINE W REFLEX MICROSCOPIC - Abnormal; Notable for the following components:   Color, Urine STRAW (*)    Hgb urine dipstick MODERATE (*)    All other components within normal limits  CBC WITH DIFFERENTIAL/PLATELET  LIPASE, BLOOD  POC OCCULT BLOOD, ED    EKG None  Radiology No results found.  Procedures Procedures (including critical care time)  Medications Ordered in ED Medications - No data to display   Initial Impression / Assessment and Plan / ED Course  I have reviewed the triage vital signs and the nursing notes.  Pertinent labs & imaging results that were available during my care of the patient were reviewed by me and considered in my medical decision making (see chart for details).        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  Possible gastritis, GERD. H/o daily ETOH use.  Exam reassuring with no abd tenderness.  No red flag symptoms of vomiting, diarrhea such as fever, chest pain, hematemesis, signs of severe dehydration.  Report of black stool but negative hemoccult with light brown stool on exam.  No recent abx or travel.  No known PUD or previous GIB.  No chronic use of NSAIDs, but daily ETOH use.  Electrolytes, liver function tests, lipase and CBC are all normal today. Low suspicion for intra-abdominal/pelvic life threatening etiology.  Doubt perforated ulcer, pancreatitis, cholecystitis, diverticulitis, SBO, dissection.  H/o and exam not consistent with complications from known stable AAA.Marland Kitchen Patient tolerated fluids in the ED. He pain or  antiemetic medications because he felt better. No signs of significant  dehydration and tolerating PO, deferred IVF. Patient did not have episodes of emesis or diarrhea in the ED.  Re-evaluation reassuring. Patient will be discharged at this time with zofran, supportive care, PO fluids.  Discussed risks of daily ETOH use. Return precautions given.     Final Clinical Impressions(s) / ED Diagnoses   Final diagnoses:  Diarrhea of presumed infectious origin    ED Discharge Orders         Ordered    ondansetron (ZOFRAN ODT) 4 MG disintegrating tablet  Every 8 hours PRN     05/03/18 1916           Arlean Hopping 05/04/18 1338    Virgel Manifold, MD 05/04/18 2002

## 2018-05-03 NOTE — ED Triage Notes (Signed)
Pt is coming by home via [facility]. Pt reports N/V/D and abdominal pain since around 8am this morning.  Pt denies other symptoms. Pt reports SOB following bouts of emesis.  Pt is alert and oriented at this time.  No active emesis with EMS

## 2018-05-03 NOTE — ED Notes (Signed)
Patient offered fluid.

## 2018-05-03 NOTE — ED Notes (Signed)
Patient made aware of BP and said he preferred to take his home meds at home.

## 2018-05-03 NOTE — Discharge Instructions (Signed)
Work up in the emergency department was reassuring. I suspect your symptoms are due to a viral infection or food poisoning. Symptoms of uncomplicated viral gastrointestinal infections usually last 24-48 hours, and slowly improve after 2-3 days.    Treatment includes symptoms control, oral hydration, prevention of spread of illness.   Ondansetron (zofran) for nausea every 6 hours for the next 24 hours to avoid vomiting and tolerate fluids.  Acetaminophen (tylenol) for body aches, abdominal pain. Ibuprofen (advil, aleve, motrin) can be irritating to stomach, avoid these until you feel better.  Stay well hydrated.  Start with bland, mild diet avoiding hot, spicy, greasy foods until you start to feel better and then can transition to normal diet. Avoid fruit or fruit juices as these can worsen diarrhea.  Norovirus and other stomach bugs are killed by alcohol and standard cleaning agents. Wash hands often and do not share utensils or drinks to avoid spread to family members.  Return for worsening abdominal pain, localized right upper or right lower abdominal pain, chest pain, shortness of breath, inability to tolerating fluids by mouth despite nausea medication, localized abdominal pain, blood in vomit or stool, blood in your urine, burning with urination, fever

## 2018-05-09 NOTE — Telephone Encounter (Signed)
Called and offered virtual visit with the patient he declines. He states that he feels a lot better now and denies having any CP, SOB, LEE, or any other Sx. Patient has been rescheduled to see Dr. Irish Lack on 07/17/18 at 9:20 AM. Patient understands to let us know if his Sx change or worsen before then.

## 2018-05-25 DIAGNOSIS — I509 Heart failure, unspecified: Secondary | ICD-10-CM | POA: Diagnosis not present

## 2018-05-25 DIAGNOSIS — I10 Essential (primary) hypertension: Secondary | ICD-10-CM | POA: Diagnosis not present

## 2018-06-01 ENCOUNTER — Other Ambulatory Visit: Payer: Self-pay | Admitting: Family Medicine

## 2018-06-01 DIAGNOSIS — I10 Essential (primary) hypertension: Secondary | ICD-10-CM | POA: Diagnosis not present

## 2018-06-01 DIAGNOSIS — I509 Heart failure, unspecified: Secondary | ICD-10-CM | POA: Diagnosis not present

## 2018-06-01 NOTE — Telephone Encounter (Signed)
This is your pt

## 2018-06-07 DIAGNOSIS — I10 Essential (primary) hypertension: Secondary | ICD-10-CM | POA: Diagnosis not present

## 2018-06-07 DIAGNOSIS — J449 Chronic obstructive pulmonary disease, unspecified: Secondary | ICD-10-CM | POA: Diagnosis not present

## 2018-06-07 DIAGNOSIS — I509 Heart failure, unspecified: Secondary | ICD-10-CM | POA: Diagnosis not present

## 2018-06-22 DIAGNOSIS — I509 Heart failure, unspecified: Secondary | ICD-10-CM | POA: Diagnosis not present

## 2018-06-22 DIAGNOSIS — J449 Chronic obstructive pulmonary disease, unspecified: Secondary | ICD-10-CM | POA: Diagnosis not present

## 2018-06-22 DIAGNOSIS — I10 Essential (primary) hypertension: Secondary | ICD-10-CM | POA: Diagnosis not present

## 2018-06-27 DIAGNOSIS — H401122 Primary open-angle glaucoma, left eye, moderate stage: Secondary | ICD-10-CM | POA: Diagnosis not present

## 2018-06-27 DIAGNOSIS — H401113 Primary open-angle glaucoma, right eye, severe stage: Secondary | ICD-10-CM | POA: Diagnosis not present

## 2018-06-27 DIAGNOSIS — H4922 Sixth [abducent] nerve palsy, left eye: Secondary | ICD-10-CM | POA: Diagnosis not present

## 2018-07-11 ENCOUNTER — Telehealth: Payer: Self-pay

## 2018-07-11 NOTE — Progress Notes (Signed)
Virtual Visit via Telephone Note   This visit type was conducted due to national recommendations for restrictions regarding the COVID-19 Pandemic (e.g. social distancing) in an effort to limit this patient's exposure and mitigate transmission in our community.  Due to his co-morbid illnesses, this patient is at least at moderate risk for complications without adequate follow up.  This format is felt to be most appropriate for this patient at this time.  The patient did not have access to video technology/had technical difficulties with video requiring transitioning to audio format only (telephone).  All issues noted in this document were discussed and addressed.  No physical exam could be performed with this format.  Please refer to the patient's chart for his  consent to telehealth for [facility].   Date:  07/13/2018   ID:  Leonard [name], DOB 1950-11-16, MRN [id number]  Patient Location: Home Provider Location: Home  PCP:  Lucianne Lei, MD  Cardiologist:  [name], MD  Electrophysiologist:  None   Evaluation Performed:  Follow-Up Visit  Chief Complaint:  NICM  History of Present Illness:    Leonard [name] is a 68 y.o. male with history of hypertension, hypertensive heart disease, nonischemic cardiomyopathy, tobacco abuse, who had an abnormal echo with EF of 40 to 45% and grade 1 DD followed by cardiac catheterization 06/2016 found to have 40% proximal RCA, 25% distal RCA, 20% mid LAD moderate LV systolic dysfunction ejection fraction 35 to 45% normal pulmonary artery pressures.Good blood pressure control was stressed. Patient has had a cough with ACE inhibitors in the past and a rash with losartan.  Follow-up 2D echo 02/2017 showed further decrease in LV function EF now 30 to 35% with diffuse hypokinesis and grade 1 DD,also had moderate AI and mild MR. Patient had been noncompliant with his medications.CT angiogram 02/28/2017 showed an acute pulmonary embolism in the right  middle lobe and mild aneurysmal dilatation of the ascending thoracic aorta measuring 4.0 cm. We did not see him that hospitalization. He was sent home on Xarelto for 6 months. In the emergency room 08/10/2017 with COPD exacerbation.  In 10/18/2017, he had acute on chronic CHF after running out of his medications for about 2 months.  I increased his Lasix to 80 mg daily for 3 days then back to 40 mg daily.  I resumed his carvedilol Crestor.  Creatinine was 1.35 that day.  LDL 83.  The patient does not have symptoms concerning for COVID-19 infection (fever, chills, cough, or new shortness of breath).   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  He still has shortness of breath.  He stopped taking potassium because of the size of the pill and is eating more bananas and potatoes. "  If he forgets to take his pills, he feels volume overload.    He has not been exercising.  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  He is trying to avoid salt.   He has been selling masks.   He does check BP at home and it is reasonably well controlled.    Past Medical History:  Diagnosis Date  . Asthma   . CHF (congestive heart failure) ([place])   . Hypertension   . Prostate cancer [facility])    Past Surgical History:  Procedure Laterality Date  . APPENDECTOMY    . HYDROCELE EXCISION Left 12/01/2015   Procedure: REPAIR OF LEFT  HYDROCELE;  Surgeon: Raynelle Bring, MD;  [facility]: [facility];  Service: Urology;  Laterality: Left;  . LYMPHADENECTOMY Bilateral 09/24/2013   Procedure: LYMPHADENECTOMY;  Surgeon: Raynelle Bring, MD;  [facility];  Service: Urology;  Laterality: Bilateral;  . PROSTATE BIOPSY    . RIGHT/LEFT HEART CATH AND CORONARY ANGIOGRAPHY N/A 06/10/2016   Procedure: Right/Left Heart Cath and Coronary Angiography;  Surgeon: Jettie Booze, MD;  Location: [facility];  Service: Cardiovascular;  Laterality: N/A;  . ROBOT  ASSISTED LAPAROSCOPIC RADICAL PROSTATECTOMY N/A 09/24/2013   Procedure: ROBOTIC ASSISTED LAPAROSCOPIC RADICAL PROSTATECTOMY LEVEL 2;  Surgeon: Raynelle Bring, MD;  [facility];  Service: Urology;  Laterality: N/A;     Current Meds  Medication Sig  . albuterol (PROVENTIL HFA;VENTOLIN HFA) 108 (90 Base) MCG/ACT inhaler Inhale 2 puffs into the lungs every 4 (four) hours as needed for wheezing or shortness of breath.  Marland Kitchen albuterol (PROVENTIL) (2.5 MG/3ML) 0.083% nebulizer solution Take 3 mLs (2.5 mg total) by nebulization every 6 (six) hours as needed for wheezing or shortness of breath.  Marland Kitchen amLODipine (NORVASC) 5 MG tablet Take 1 tablet by mouth daily  . carvedilol (COREG) 25 MG tablet Take 1 tablet (25 mg total) by mouth 2 (two) times daily.  . colchicine 0.6 MG tablet Take 1 tablet (0.6 mg total) by mouth daily.  . COMBIGAN 0.2-0.5 % ophthalmic solution Place 1 drop into both eyes daily.  . furosemide (LASIX) 40 MG tablet Take 1 tablet (40 mg total) by mouth daily.  Marland Kitchen latanoprost (XALATAN) 0.005 % ophthalmic solution Place 1 drop into both eyes at bedtime.   . ondansetron (ZOFRAN ODT) 4 MG disintegrating tablet Take 1 tablet (4 mg total) by mouth every 8 (eight) hours as needed for nausea or vomiting.  . SYMBICORT 160-4.5 MCG/ACT inhaler Inhale 1 puff into the lungs daily.     Allergies:   Losartan   Social History   Tobacco Use  . Smoking status: Current Every Day Smoker    Packs/day: 0.25    Years: 30.00    Pack years: 7.50    Types: Cigarettes    Last attempt to quit: 12/24/2015    Years since quitting: 2.5  . Smokeless tobacco: Never Used  . Tobacco comment: STtses he has cut down from 2 PPD to 4 cigarettes per day  Substance Use Topics  . Alcohol use: Yes    Alcohol/week: 6.0 - 7.0 standard drinks    Types: 6 - 7 Shots of liquor per week    Comment: states 6-7 shots of liquor daily  . Drug use: No    Types: Marijuana     Family Hx: The patient's family history includes  Hypertension in his sister. There is no history of Cancer.  ROS:   Please see the history of present illness.     All other systems reviewed and are negative.   Prior CV studies:   The following studies were reviewed today:  EF 30-35% in 2019  Labs/Other Tests and Data Reviewed:    EKG:  An ECG dated 11/19 was personally reviewed today and demonstrated:  NSR, no ST changes  Recent Labs: 05/03/2018: ALT 41; BUN 12; Creatinine, Ser 1.02; Hemoglobin 14.8; Platelets 194; Potassium 3.6; Sodium 140 BNP 358 in 4/20  Recent Lipid Panel Lab Results  Component Value Date/Time   CHOL 183 10/18/2017 08:43 AM   TRIG 79 10/18/2017 08:43 AM   HDL 84 10/18/2017 08:43 AM   CHOLHDL 2.2 10/18/2017 08:43  AM   LDLCALC 83 10/18/2017 08:43 AM    Wt Readings from Last 3 Encounters:  07/13/18 187 lb (84.8 kg)  05/03/18 180 lb (81.6 kg)  12/22/17 180 lb (81.6 kg)     Objective:    Vital Signs:  Ht 5\' 7"  (1.702 m)   Wt 187 lb (84.8 kg)   BMI 29.29 kg/m    VITAL SIGNS:  reviewed GEN:  no acute distress RESPIRATORY:  no shortness of breath PSYCH:  normal affect exam limited by phone format  ASSESSMENT & PLAN:    1. Nonischemic cardiomyopathy/ Chronic systolic heart failure: Appears euvlemic. 2. Hypertensive heart disease: BP checked recently and was ewll controlled.  3. Pulmonary embolus: Treated with Xarelto for 6 months. 4. Tobacco abuse: He needs to stop smoking.  COVID-19 Education: The signs and symptoms of COVID-19 were discussed with the patient and how to seek care for testing (follow up with PCP or arrange E-visit).  The importance of social distancing was discussed today.  Time:   Today, I have spent 20 minutes with the patient with telehealth technology discussing the above problems.     Medication Adjustments/Labs and Tests Ordered: Current medicines are reviewed at length with the patient today.  Concerns regarding medicines are outlined above.   Tests Ordered: No  orders of the defined types were placed in this encounter.   Medication Changes: No orders of the defined types were placed in this encounter.   Disposition:  Follow up in 6 month(s)  Signed, [name], MD  07/13/2018 8:28 AM    [facility]

## 2018-07-11 NOTE — Telephone Encounter (Signed)
Virtual Visit Pre-Appointment Phone Call  TELEPHONE CALL NOTE  Leonard [name] has been deemed a candidate for a follow-up tele-health visit to limit community exposure during the Covid-19 pandemic. I spoke with the patient via phone to ensure availability of phone/video source, confirm preferred email & phone number, and discuss instructions and expectations.  I reminded Leonard [name] to be prepared with any vital sign and/or heart rhythm information that could potentially be obtained via home monitoring, at the time of his visit. I reminded Leonard [name] to expect a phone call prior to his visit.  Patient agrees to consent below.  Leonard Gustin, RN 07/11/2018 12:22 PM   FULL LENGTH CONSENT FOR TELE-HEALTH VISIT   I hereby voluntarily request, consent and authorize [facility] and its employed or contracted physicians, physician assistants, nurse practitioners or other licensed health care professionals (the Practitioner), to provide me with telemedicine health care services (the "Services") as deemed necessary by the treating Practitioner. I acknowledge and consent to receive the Services by the Practitioner via telemedicine. I understand that the telemedicine visit will involve communicating with the Practitioner through live audiovisual communication technology and the disclosure of certain medical information by electronic transmission. I acknowledge that I have been given the opportunity to request an in-person assessment or other available alternative prior to the telemedicine visit and am voluntarily participating in the telemedicine visit.  I understand that I have the right to withhold or withdraw my consent to the use of telemedicine in the course of my care at any time, without affecting my right to future care or treatment, and that the Practitioner or I may terminate the telemedicine visit at any time. I understand that I have the right to inspect all information obtained  and/or recorded in the course of the telemedicine visit and may receive copies of available information for a reasonable fee.  I understand that some of the potential risks of receiving the Services via telemedicine include:  Marland Kitchen Delay or interruption in medical evaluation due to technological equipment failure or disruption; . Information transmitted may not be sufficient (e.g. poor resolution of images) to allow for appropriate medical decision making by the Practitioner; and/or  . In rare instances, security protocols could fail, causing a breach of personal health information.  Furthermore, I acknowledge that it is my responsibility to provide information about my medical history, conditions and care that is complete and accurate to the best of my ability. I acknowledge that Practitioner's advice, recommendations, and/or decision may be based on factors not within their control, such as incomplete or inaccurate data provided by me or distortions of diagnostic images or specimens that may result from electronic transmissions. I understand that the practice of medicine is not an exact science and that Practitioner makes no warranties or guarantees regarding treatment outcomes. I acknowledge that I will receive a copy of this consent concurrently upon execution via email to the email address I last provided but may also request a printed copy by calling the office of [place].    I understand that my insurance will be billed for this visit.   I have read or had this consent read to me. . I understand the contents of this consent, which adequately explains the benefits and risks of the Services being provided via telemedicine.  . I have been provided ample opportunity to ask questions regarding this consent and the Services and have had my questions answered to my satisfaction. . I give my informed  consent for the services to be provided through the use of telemedicine in my medical care  By  participating in this telemedicine visit I agree to the above.

## 2018-07-13 ENCOUNTER — Telehealth (INDEPENDENT_AMBULATORY_CARE_PROVIDER_SITE_OTHER): Payer: Medicare Other | Admitting: Interventional Cardiology

## 2018-07-13 ENCOUNTER — Other Ambulatory Visit: Payer: Self-pay

## 2018-07-13 ENCOUNTER — Encounter: Payer: Self-pay | Admitting: Interventional Cardiology

## 2018-07-13 VITALS — Ht 67.0 in | Wt 187.0 lb

## 2018-07-13 DIAGNOSIS — I5022 Chronic systolic (congestive) heart failure: Secondary | ICD-10-CM | POA: Diagnosis not present

## 2018-07-13 DIAGNOSIS — I110 Hypertensive heart disease with heart failure: Secondary | ICD-10-CM | POA: Diagnosis not present

## 2018-07-13 DIAGNOSIS — Z86711 Personal history of pulmonary embolism: Secondary | ICD-10-CM

## 2018-07-13 DIAGNOSIS — I428 Other cardiomyopathies: Secondary | ICD-10-CM

## 2018-07-13 NOTE — Patient Instructions (Signed)
Medication Instructions:  Your physician recommends that you continue on your current medications as directed. Please refer to the Current Medication list given to you today.  If you need a refill on your cardiac medications before your next appointment, please call your pharmacy.   Lab work: Please have your A1C checked with your Primary Care Doctor next week  If you have labs (blood work) drawn today and your tests are completely normal, you will receive your results only by: Marland Kitchen MyChart Message (if you have MyChart) OR . A paper copy in the mail If you have any lab test that is abnormal or we need to change your treatment, we will call you to review the results.  Testing/Procedures: None ordered  Follow-Up: At [facility], you and your health needs are our priority.  As part of our continuing mission to provide you with exceptional heart care, we have created designated Provider Care Teams.  These Care Teams include your primary Cardiologist (physician) and Advanced Practice Providers (APPs -  Physician Assistants and Nurse Practitioners) who all work together to provide you with the care you need, when you need it. . You will need a follow up appointment in 6 months.  Please call our office 2 months in advance to schedule this appointment.  You may see Casandra Doffing, MD or one of the following Advanced Practice Providers on your designated Care Team:   . Lyda Jester, PA-C . Dayna Dunn, PA-C . Ermalinda Barrios, PA-C  Any Other Special Instructions Will Be Listed Below (If Applicable).  Increase your walking. Your provider recommends that you maintain 150 minutes per week of moderate aerobic activity.

## 2018-07-14 DIAGNOSIS — H4922 Sixth [abducent] nerve palsy, left eye: Secondary | ICD-10-CM | POA: Diagnosis not present

## 2018-07-17 ENCOUNTER — Ambulatory Visit: Payer: Medicare Other | Admitting: Interventional Cardiology

## 2018-07-26 ENCOUNTER — Other Ambulatory Visit: Payer: Self-pay | Admitting: Ophthalmology

## 2018-07-26 DIAGNOSIS — H4922 Sixth [abducent] nerve palsy, left eye: Secondary | ICD-10-CM

## 2018-07-31 ENCOUNTER — Other Ambulatory Visit: Payer: Self-pay | Admitting: *Deleted

## 2018-07-31 ENCOUNTER — Encounter: Payer: Self-pay | Admitting: *Deleted

## 2018-07-31 NOTE — Patient Outreach (Addendum)
[place] [facility]) Care Management  07/31/2018  Karmelo Bass 1950/08/22 [id number]   RN Health Coach attempted follow up outreach call to patient.  Patient was unavailable. HIPPA compliance voicemail message left with return callback number.  Plan: RN will call patient again within 30 days.  [place] Care Management [phone]

## 2018-08-02 NOTE — Telephone Encounter (Signed)
This encounter was created in error - please disregard.

## 2018-08-09 DIAGNOSIS — J449 Chronic obstructive pulmonary disease, unspecified: Secondary | ICD-10-CM | POA: Diagnosis not present

## 2018-08-09 DIAGNOSIS — I509 Heart failure, unspecified: Secondary | ICD-10-CM | POA: Diagnosis not present

## 2018-08-09 DIAGNOSIS — R7309 Other abnormal glucose: Secondary | ICD-10-CM | POA: Diagnosis not present

## 2018-08-09 DIAGNOSIS — I10 Essential (primary) hypertension: Secondary | ICD-10-CM | POA: Diagnosis not present

## 2018-08-11 IMAGING — CR DG CHEST 2V
2 series · 2 of 2 positions shown · non-contrast
Comparison: Chest x-ray 01/10/2016.

CLINICAL DATA: 65-year-old male with history of acute respiratory
distress.

EXAM:
CHEST  2 VIEW

[chest pa]
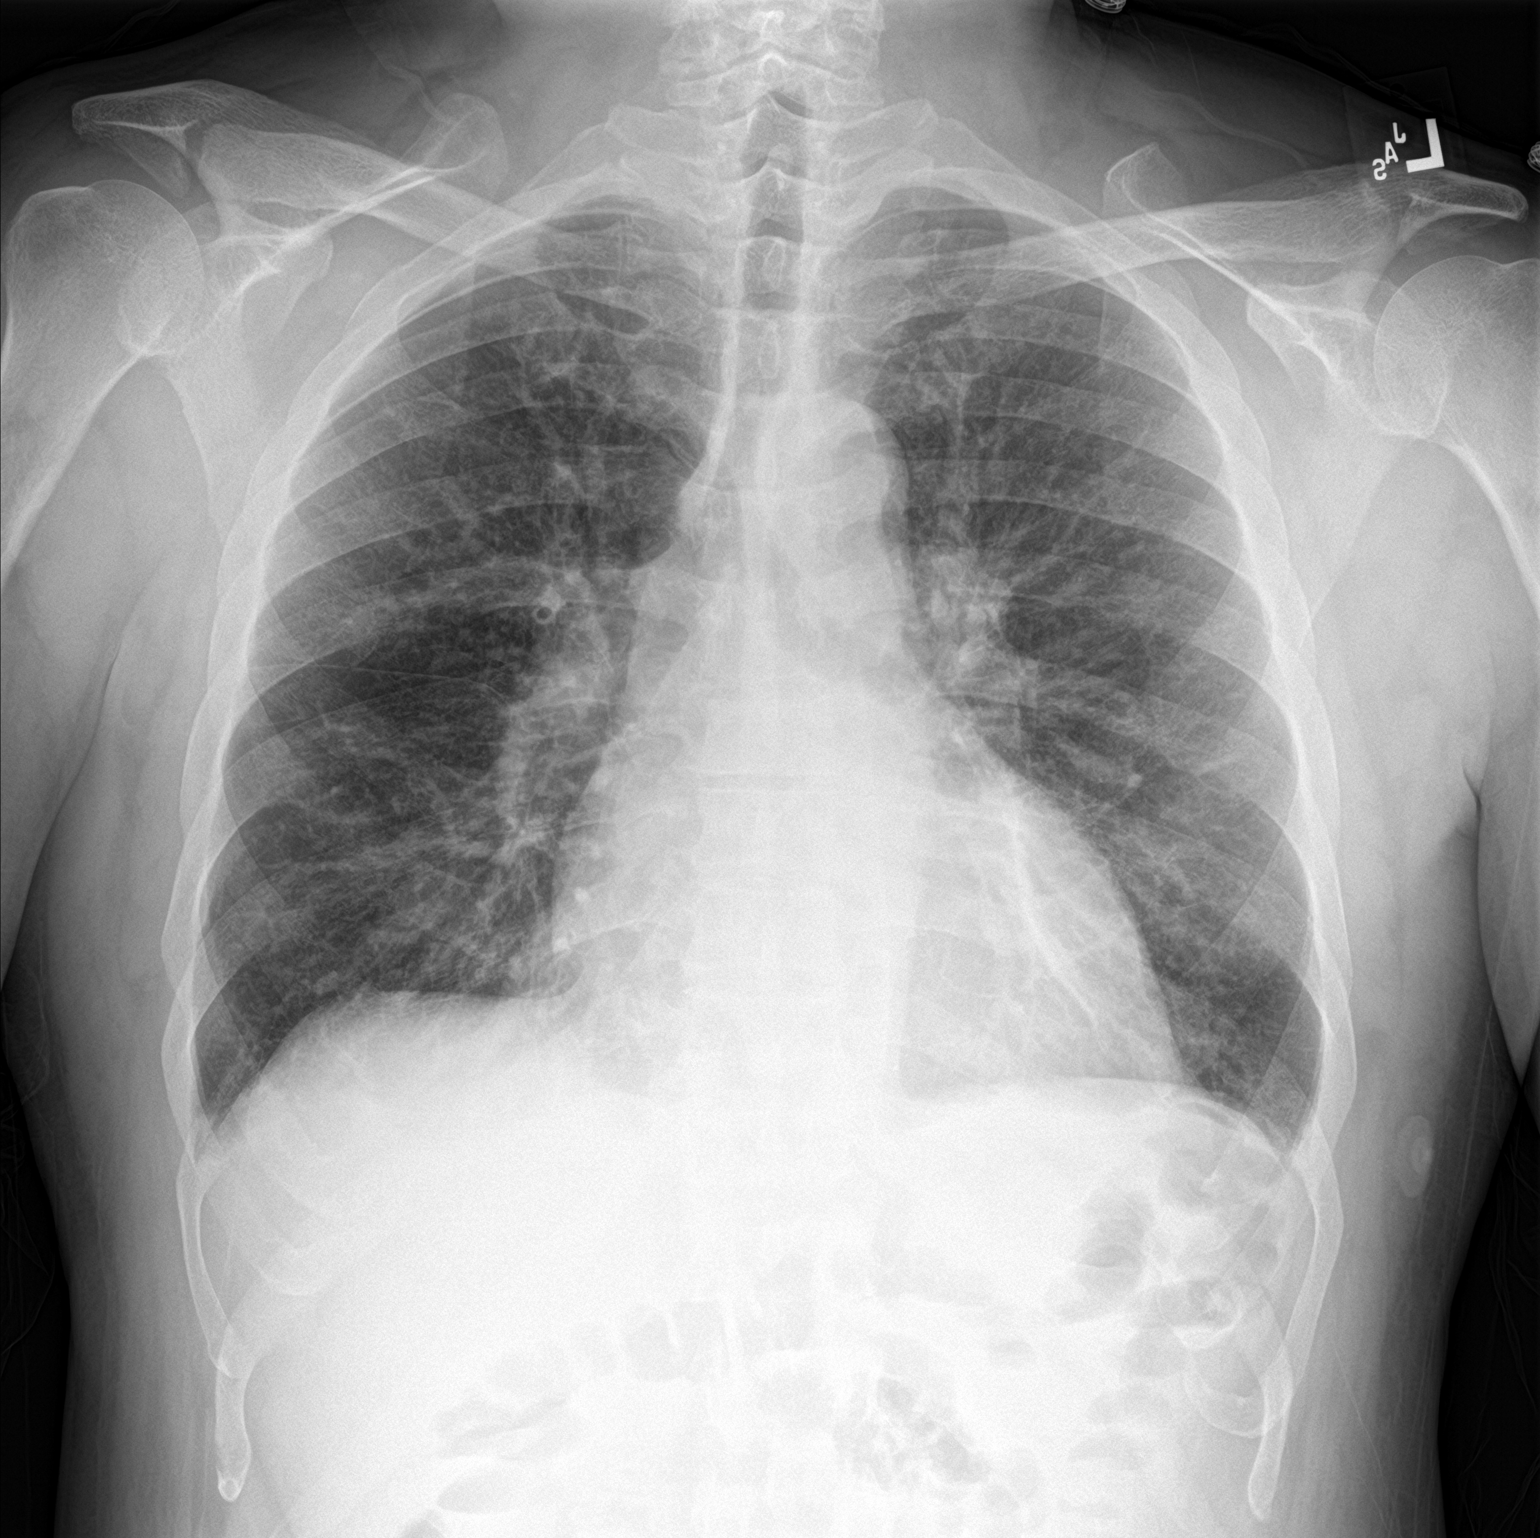

[chest lat]
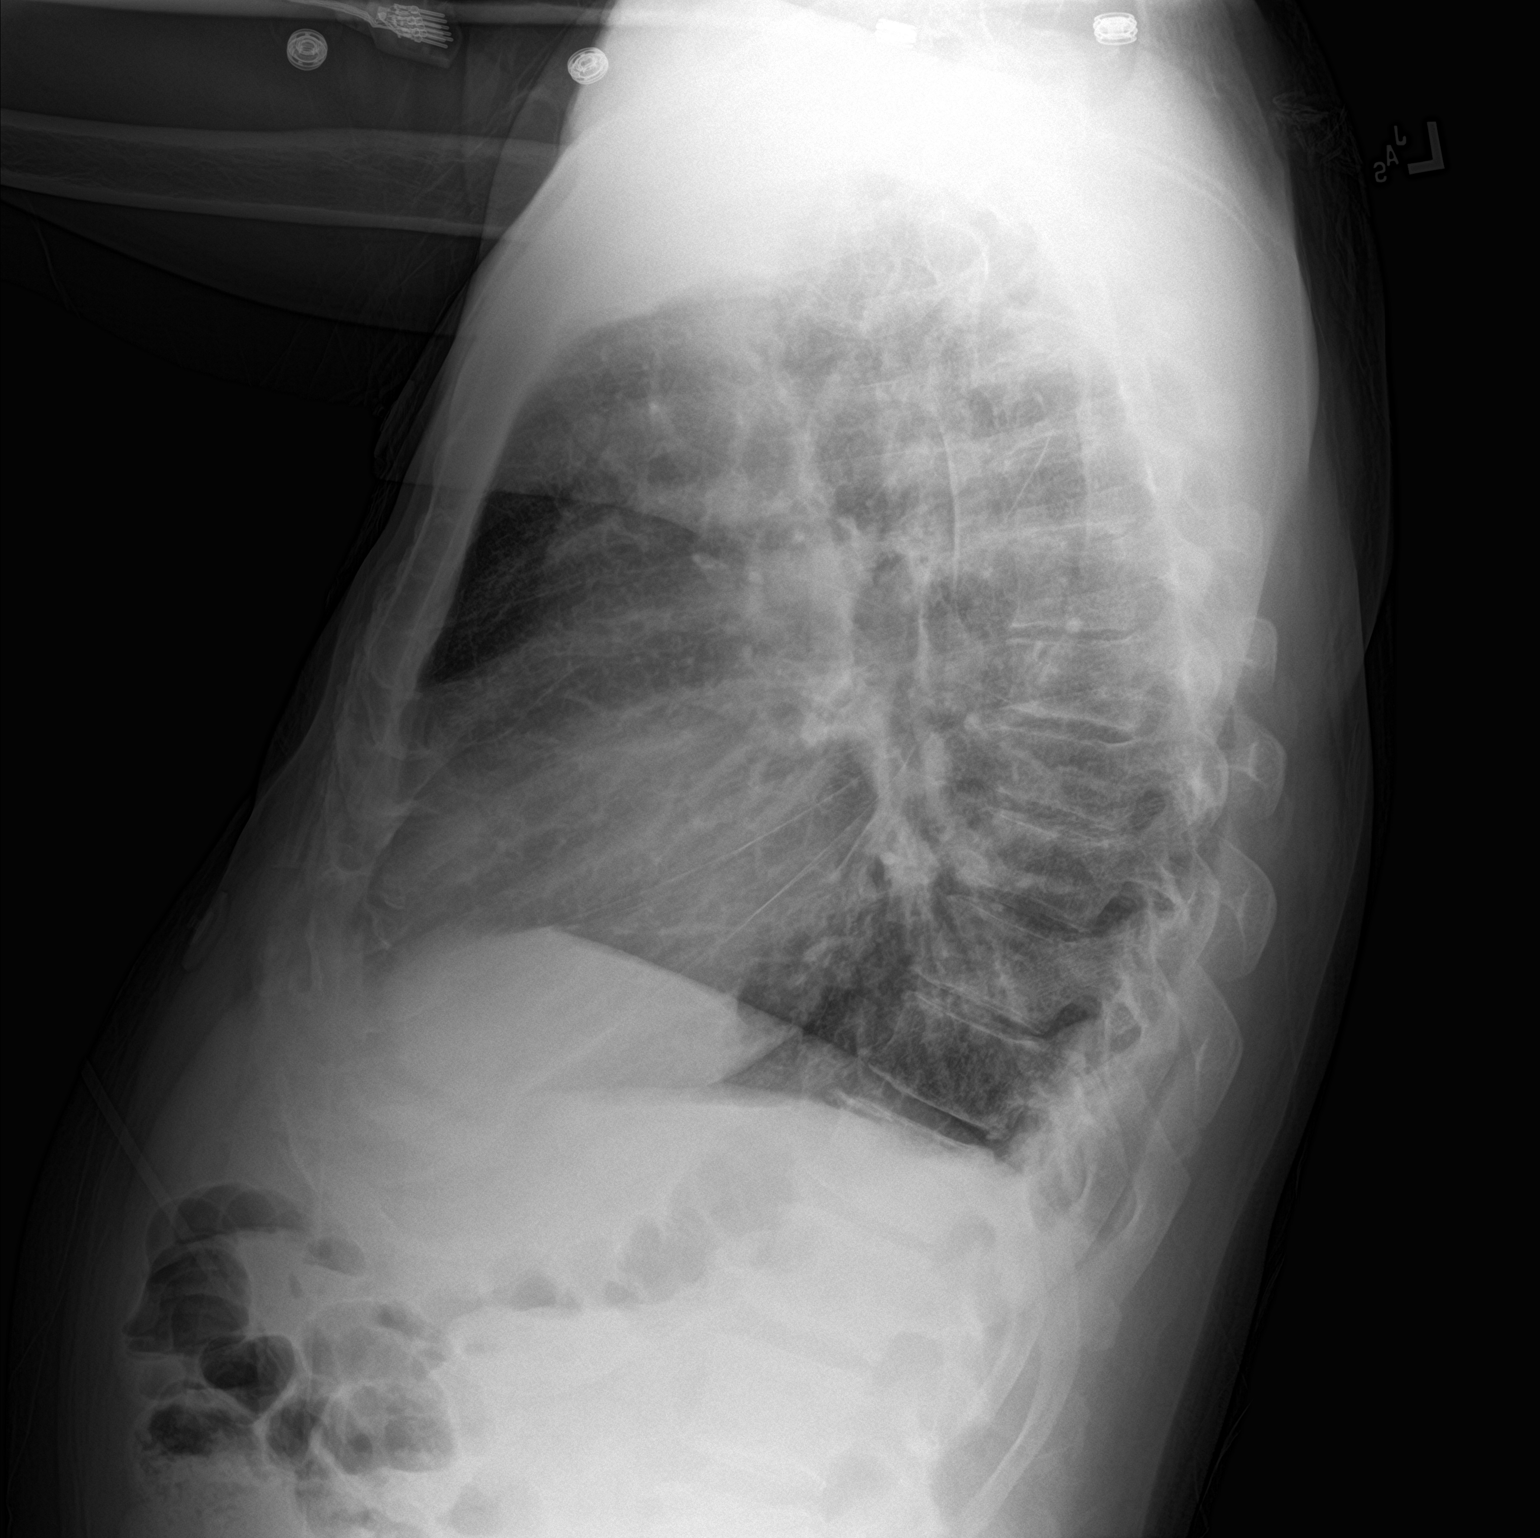

[2 of 2 positions shown; findings below may reference images not displayed]

FINDINGS: Diffuse peribronchial cuffing. Lung volumes are normal. No
consolidative airspace disease. No pleural effusions. No
pneumothorax. No pulmonary nodule or mass noted. Pulmonary
vasculature and the cardiomediastinal silhouette are within normal
limits. Aortic atherosclerosis.
IMPRESSION: 1. Aeration has improved, particularly in the right lower lobe,
which suggests a resolving right lower lobe bronchopneumonia.
Residual findings on today's study indicate a persistent background
of acute bronchitis.
2. Aortic atherosclerosis.

## 2018-08-16 ENCOUNTER — Other Ambulatory Visit: Payer: Self-pay

## 2018-08-16 ENCOUNTER — Ambulatory Visit
Admission: RE | Admit: 2018-08-16 | Discharge: 2018-08-16 | Disposition: A | Discharge status: Home / Self Care | Payer: Medicare Other | Source: Ambulatory Visit | Attending: Ophthalmology | Admitting: Ophthalmology

## 2018-08-16 DIAGNOSIS — H4922 Sixth [abducent] nerve palsy, left eye: Secondary | ICD-10-CM

## 2018-08-16 DIAGNOSIS — H532 Diplopia: Secondary | ICD-10-CM | POA: Diagnosis not present

## 2018-08-16 MED ORDER — GADOBENATE DIMEGLUMINE 529 MG/ML IV SOLN
17.0000 mL | Freq: Once | INTRAVENOUS | Status: AC | PRN
  Administered 2018-08-16: 17 mL via INTRAVENOUS

## 2018-08-19 IMAGING — CR DG CHEST 2V
2 series · 2 of 2 positions shown · non-contrast
Comparison: Chest radiograph dated 01/11/2016

CLINICAL DATA: 65-year-old male with shortness of breath

EXAM:
CHEST  2 VIEW

[chest pa]
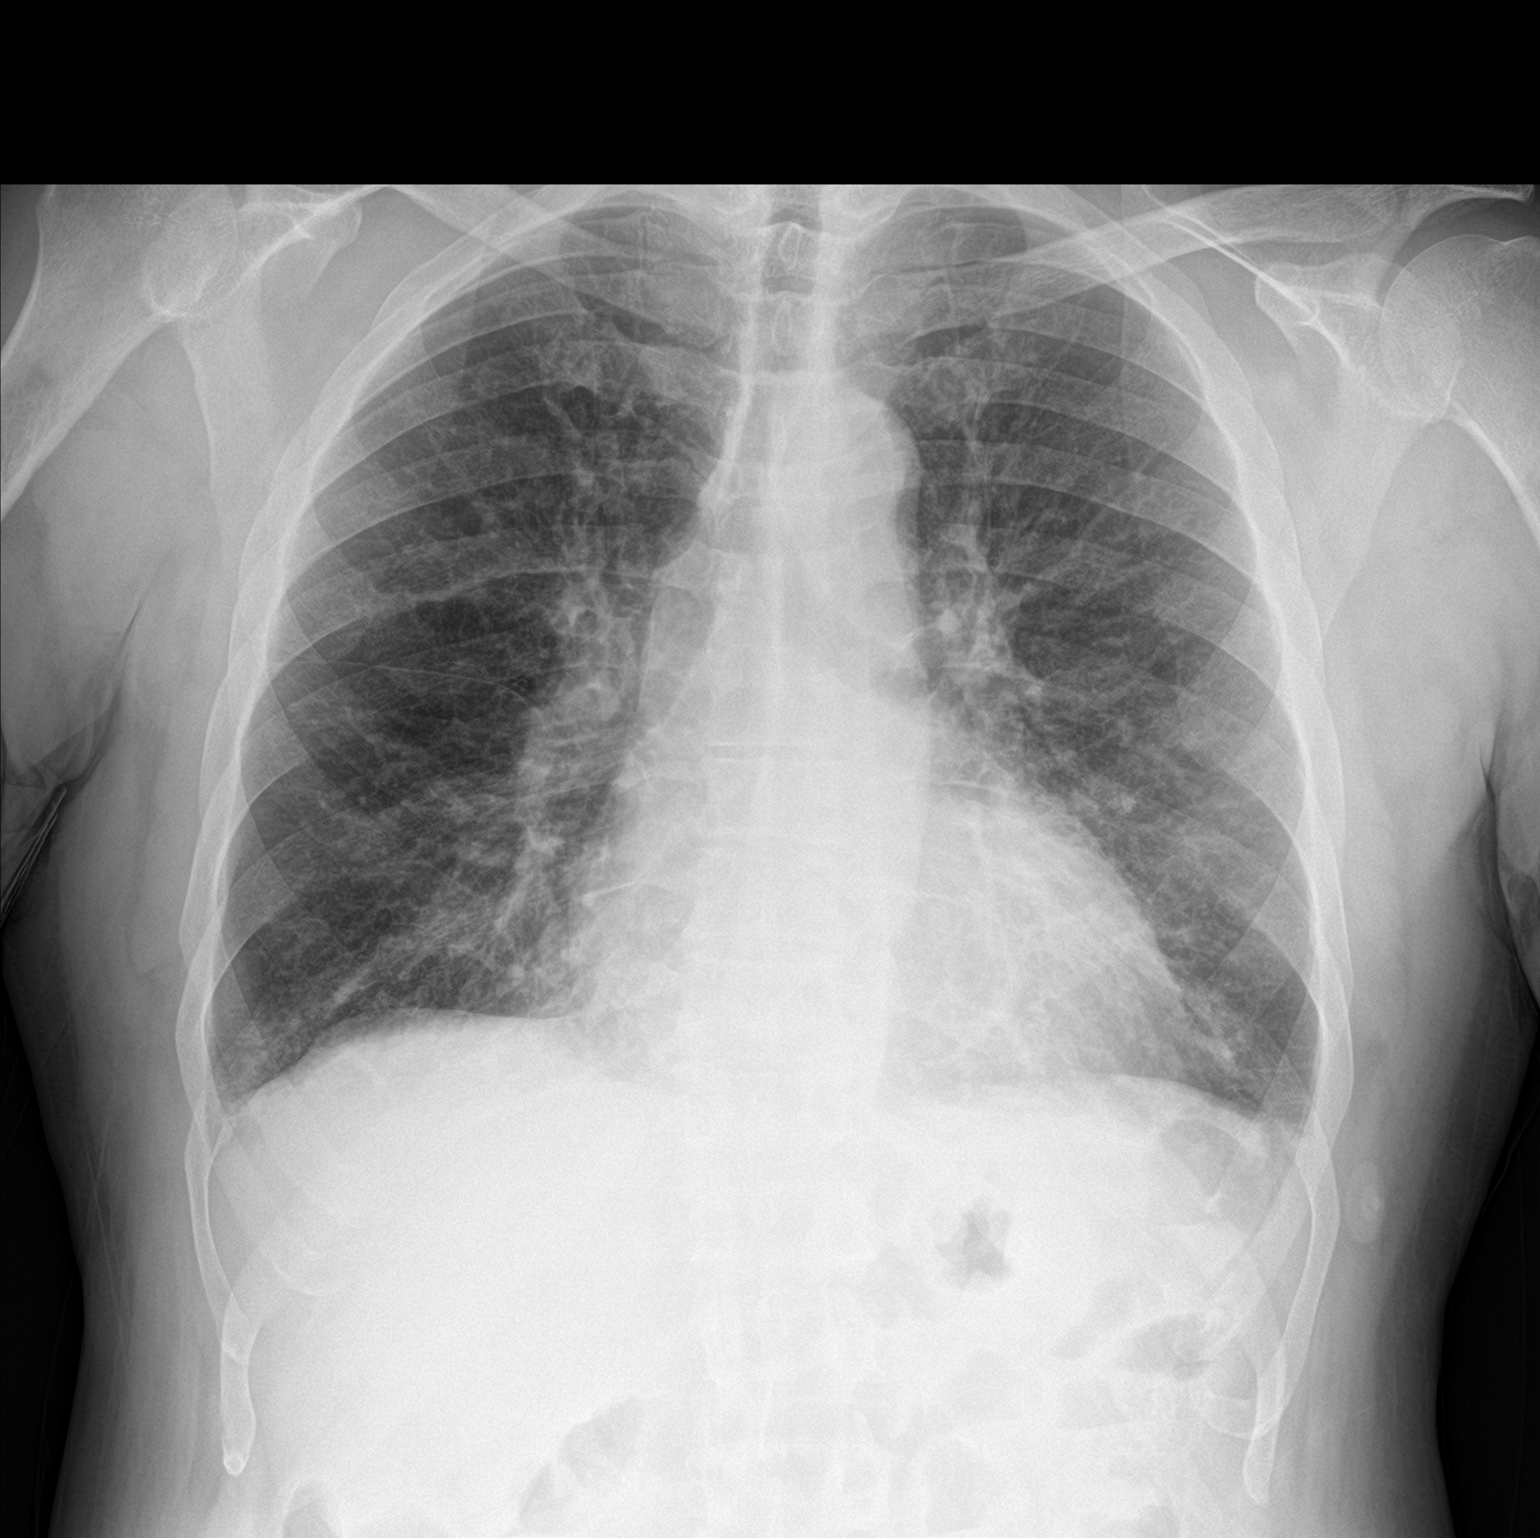

[chest lat]
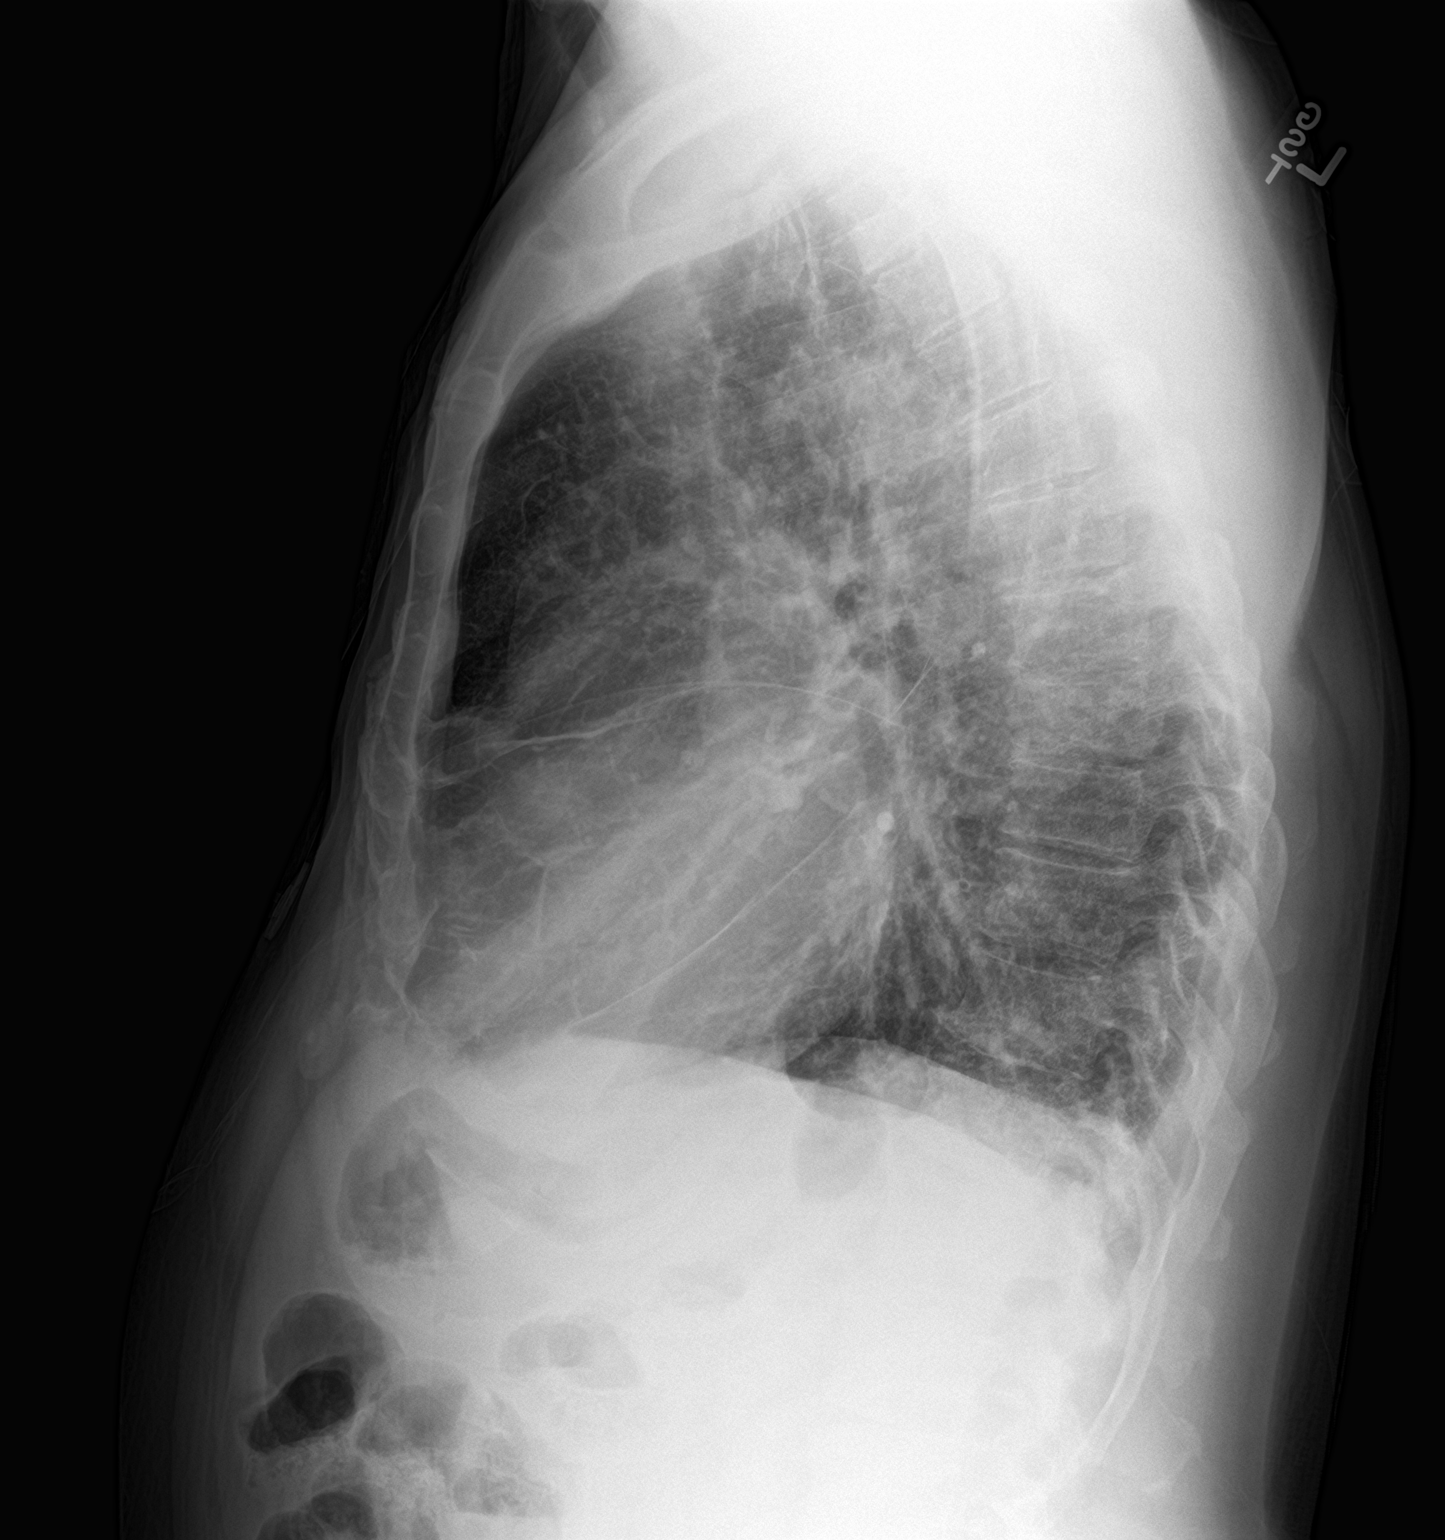

[2 of 2 positions shown; findings below may reference images not displayed]

FINDINGS: There is no focal consolidation, pleural effusion, or pneumothorax.
Mild diffuse interstitial coarsening similar to prior radiograph.
Probable mild bronchiectatic changes. The cardiac silhouette is
within normal limits. No acute osseous pathology identified.
IMPRESSION: No active cardiopulmonary disease.

## 2018-08-28 ENCOUNTER — Other Ambulatory Visit: Payer: Self-pay | Admitting: *Deleted

## 2018-08-28 NOTE — Patient Outreach (Signed)
[facility] [facility]) Care Management  08/28/2018   Leonard [name] 07-May-1950 [id number]  RN Health Coach telephone call to patient.  [place] compliance verified. Per patient he is feeling good. Patient has not been weighing self daily. Per patient he has packed the scale up when moving and have not pulled it back out. RN discussed with the patient on not weighing. Patient stated the scale that he received he was o send it back to [facility] for a newer update but he hasn't. Patient has also been having high blood pressure. Patient is not monitoring at home. RN discussed with patient about getting a monitor. Patient has a Environmental education officer but have not been taking the benefit of it. RN discussed what page the monitor is on for the patient to order. RN also discussed with the patient about getting a thermometer due to [place]. Patient is trying to adhere to his diet. Patient has agreed to follow up outreach calls.    Current Medications:  Current Outpatient Medications  Medication Sig Dispense Refill  . albuterol (PROVENTIL HFA;VENTOLIN HFA) 108 (90 Base) MCG/ACT inhaler Inhale 2 puffs into the lungs every 4 (four) hours as needed for wheezing or shortness of breath. 1 Inhaler 0  . albuterol (PROVENTIL) (2.5 MG/3ML) 0.083% nebulizer solution Take 3 mLs (2.5 mg total) by nebulization every 6 (six) hours as needed for wheezing or shortness of breath. 75 mL 2  . amLODipine (NORVASC) 5 MG tablet Take 1 tablet by mouth daily 90 tablet 1  . carvedilol (COREG) 25 MG tablet Take 1 tablet (25 mg total) by mouth 2 (two) times daily. 180 tablet 1  . colchicine 0.6 MG tablet Take 1 tablet (0.6 mg total) by mouth daily. 14 tablet 0  . COMBIGAN 0.2-0.5 % ophthalmic solution Place 1 drop into both eyes daily.  1  . furosemide (LASIX) 40 MG tablet Take 1 tablet (40 mg total) by mouth daily. 90 tablet 1  . latanoprost (XALATAN) 0.005 % ophthalmic solution Place 1 drop into both eyes at bedtime.     . ondansetron (ZOFRAN  ODT) 4 MG disintegrating tablet Take 1 tablet (4 mg total) by mouth every 8 (eight) hours as needed for nausea or vomiting. 20 tablet 0  . SYMBICORT 160-4.5 MCG/ACT inhaler Inhale 1 puff into the lungs daily.  3   No current facility-administered medications for this visit.     Functional Status:  In your present state of health, do you have any difficulty performing the following activities: 08/28/2018 03/03/2018  Hearing? N N  Vision? N N  Difficulty concentrating or making decisions? N N  Walking or climbing stairs? N N  Dressing or bathing? N N  Doing errands, shopping? N N  Preparing Food and eating ? N N  Using the Toilet? N N  In the past six months, have you accidently leaked urine? N N  Do you have problems with loss of bowel control? N N  Managing your Medications? N N  Managing your Finances? N N  Housekeeping or managing your Housekeeping? N N  Some recent data might be hidden    Fall/Depression Screening: Fall Risk  08/28/2018 05/01/2018 03/03/2018  Falls in the past year? 0 0 0  Number falls in past yr: - - -  Injury with Fall? - - -  Follow up Falls evaluation completed Falls evaluation completed Falls evaluation completed   PHQ 2/9 Scores 08/28/2018 05/01/2018 03/03/2018 12/29/2017 12/28/2017 06/04/2016 04/14/2016  PHQ - 2 Score 0 0 0  1 1 0 0   THN CM Care Plan Problem One     Most Recent Value  Care Plan Problem One  Knowledge Deficit in Self Management of CHF  Role Documenting the Problem One  Nectar for Problem One  Active  THN Long Term Goal   Patient will not have any admissions for CHF within the next 90 days  THN Long Term Goal Start Date  08/28/18  Interventions for Problem One Long Term Goal  RN rieterateed the CHF zones and action plan. Patient had stopped weighing self. RN reiterated the significance of weighing and how that correlates with the [place] prevents readmission with early findings. RN will follow up with further discussion  THN CM  Short Term Goal #1   Patient will verbalize understanding symptoms of CHF within the next 30 days  THN CM Short Term Goal #1 Start Date  08/28/18  Interventions for Short Term Goal #1  RN reiterates medication adherence. RN discussed the symptoms. RN will follow up with further discussion  THN CM Short Term Goal #2   Patient will verbalize weighing daily and documenting within the next 30 days  Interventions for Short Term Goal #2  RN reiterated to patient tha he needs to weigh. Patient packed scale away and was told to send back to [facility] for an updated equipment. Patient has not sent back. RN will follow up for compliance  THN CM Short Term Goal #3  Patient will have a better understanding of low sodium diet within the next 30 days  THN CM Short Term Goal #3 Start Date  08/28/18  Interventions for Short Tern Goal #3  Patient has educational material on low sodium diet. It is now reiterating what was given. RN will follow up with further discussion      Assessment:  Patient has not been monitoring weight Patient has not been monitoring BP Patient needs to send the [facility] scale back for update as [facility] requested Patient does take coronavirus safety precautions  Plan:  Send patient copy of [facility] supply catalog RN discussed daily weights Patient will do daily weights by next outreach Patient will order BP monitor by next outreach call RN will send A matter of control blood pressure booklet RN will send sheet on how to read blood pressure RN will follow up within the month of September  Leonard [name] [facility] Management [phone]

## 2018-09-11 DIAGNOSIS — J449 Chronic obstructive pulmonary disease, unspecified: Secondary | ICD-10-CM | POA: Diagnosis not present

## 2018-09-11 DIAGNOSIS — I10 Essential (primary) hypertension: Secondary | ICD-10-CM | POA: Diagnosis not present

## 2018-09-11 DIAGNOSIS — R7309 Other abnormal glucose: Secondary | ICD-10-CM | POA: Diagnosis not present

## 2018-09-11 DIAGNOSIS — I509 Heart failure, unspecified: Secondary | ICD-10-CM | POA: Diagnosis not present

## 2018-10-04 DIAGNOSIS — I509 Heart failure, unspecified: Secondary | ICD-10-CM | POA: Diagnosis not present

## 2018-10-04 DIAGNOSIS — Z0000 Encounter for general adult medical examination without abnormal findings: Secondary | ICD-10-CM | POA: Diagnosis not present

## 2018-10-04 DIAGNOSIS — I10 Essential (primary) hypertension: Secondary | ICD-10-CM | POA: Diagnosis not present

## 2018-10-04 DIAGNOSIS — J449 Chronic obstructive pulmonary disease, unspecified: Secondary | ICD-10-CM | POA: Diagnosis not present

## 2018-10-27 ENCOUNTER — Other Ambulatory Visit: Payer: Self-pay

## 2018-10-27 ENCOUNTER — Observation Stay (HOSPITAL_COMMUNITY)
Admission: EM | Admit: 2018-10-27 | Discharge: 2018-10-27 | Disposition: A | Discharge status: Home / Self Care | Payer: Medicare Other | Attending: Internal Medicine | Admitting: Internal Medicine

## 2018-10-27 ENCOUNTER — Emergency Department (HOSPITAL_COMMUNITY): Payer: Medicare Other

## 2018-10-27 DIAGNOSIS — N179 Acute kidney failure, unspecified: Secondary | ICD-10-CM | POA: Diagnosis not present

## 2018-10-27 DIAGNOSIS — R0600 Dyspnea, unspecified: Secondary | ICD-10-CM | POA: Diagnosis not present

## 2018-10-27 DIAGNOSIS — I959 Hypotension, unspecified: Secondary | ICD-10-CM | POA: Diagnosis present

## 2018-10-27 DIAGNOSIS — Z8249 Family history of ischemic heart disease and other diseases of the circulatory system: Secondary | ICD-10-CM | POA: Diagnosis not present

## 2018-10-27 DIAGNOSIS — Z8546 Personal history of malignant neoplasm of prostate: Secondary | ICD-10-CM | POA: Insufficient documentation

## 2018-10-27 DIAGNOSIS — Z0184 Encounter for antibody response examination: Secondary | ICD-10-CM | POA: Diagnosis not present

## 2018-10-27 DIAGNOSIS — I4891 Unspecified atrial fibrillation: Secondary | ICD-10-CM | POA: Diagnosis not present

## 2018-10-27 DIAGNOSIS — Z20828 Contact with and (suspected) exposure to other viral communicable diseases: Secondary | ICD-10-CM | POA: Diagnosis not present

## 2018-10-27 DIAGNOSIS — J449 Chronic obstructive pulmonary disease, unspecified: Secondary | ICD-10-CM | POA: Diagnosis not present

## 2018-10-27 DIAGNOSIS — Z86711 Personal history of pulmonary embolism: Secondary | ICD-10-CM | POA: Insufficient documentation

## 2018-10-27 DIAGNOSIS — I5022 Chronic systolic (congestive) heart failure: Secondary | ICD-10-CM | POA: Insufficient documentation

## 2018-10-27 DIAGNOSIS — Z79899 Other long term (current) drug therapy: Secondary | ICD-10-CM | POA: Insufficient documentation

## 2018-10-27 DIAGNOSIS — Z9114 Patient's other noncompliance with medication regimen: Secondary | ICD-10-CM | POA: Diagnosis not present

## 2018-10-27 DIAGNOSIS — F17210 Nicotine dependence, cigarettes, uncomplicated: Secondary | ICD-10-CM | POA: Insufficient documentation

## 2018-10-27 DIAGNOSIS — Z888 Allergy status to other drugs, medicaments and biological substances status: Secondary | ICD-10-CM | POA: Insufficient documentation

## 2018-10-27 DIAGNOSIS — F1010 Alcohol abuse, uncomplicated: Secondary | ICD-10-CM | POA: Diagnosis present

## 2018-10-27 DIAGNOSIS — Z9079 Acquired absence of other genital organ(s): Secondary | ICD-10-CM | POA: Insufficient documentation

## 2018-10-27 DIAGNOSIS — Z716 Tobacco abuse counseling: Secondary | ICD-10-CM | POA: Diagnosis not present

## 2018-10-27 DIAGNOSIS — R0602 Shortness of breath: Secondary | ICD-10-CM | POA: Diagnosis not present

## 2018-10-27 DIAGNOSIS — I130 Hypertensive heart and chronic kidney disease with heart failure and stage 1 through stage 4 chronic kidney disease, or unspecified chronic kidney disease: Principal | ICD-10-CM | POA: Insufficient documentation

## 2018-10-27 DIAGNOSIS — I10 Essential (primary) hypertension: Secondary | ICD-10-CM | POA: Diagnosis not present

## 2018-10-27 DIAGNOSIS — N189 Chronic kidney disease, unspecified: Secondary | ICD-10-CM | POA: Diagnosis present

## 2018-10-27 DIAGNOSIS — Z7951 Long term (current) use of inhaled steroids: Secondary | ICD-10-CM | POA: Diagnosis not present

## 2018-10-27 DIAGNOSIS — I5020 Unspecified systolic (congestive) heart failure: Secondary | ICD-10-CM | POA: Diagnosis present

## 2018-10-27 DIAGNOSIS — N182 Chronic kidney disease, stage 2 (mild): Secondary | ICD-10-CM | POA: Diagnosis not present

## 2018-10-27 LAB — BASIC METABOLIC PANEL
Anion gap: 9 (ref 5–15)
BUN: 18 mg/dL (ref 8–23)
CO2: 27 mmol/L (ref 22–32)
Calcium: 9 mg/dL (ref 8.9–10.3)
Chloride: 104 mmol/L (ref 98–111)
Creatinine, Ser: 2.02 mg/dL — ABNORMAL HIGH (ref 0.61–1.24)
GFR calc Af Amer: 38 mL/min — ABNORMAL LOW (ref >60)
GFR calc non Af Amer: 33 mL/min — ABNORMAL LOW (ref >60)
Glucose, Bld: 108 mg/dL — ABNORMAL HIGH (ref 70–99)
Potassium: 3.7 mmol/L (ref 3.5–5.1)
Sodium: 140 mmol/L (ref 135–145)

## 2018-10-27 LAB — CBC
HCT: 44.3 % (ref 39.0–52.0)
HCT: 45.6 % (ref 39.0–52.0)
Hemoglobin: 15.1 g/dL (ref 13.0–17.0)
Hemoglobin: 16.3 g/dL (ref 13.0–17.0)
MCH: 33.7 pg (ref 26.0–34.0)
MCH: 34.6 pg — ABNORMAL HIGH (ref 26.0–34.0)
MCHC: 34.1 g/dL (ref 30.0–36.0)
MCHC: 35.7 g/dL (ref 30.0–36.0)
MCV: 98.9 fL (ref 80.0–100.0)
MCV: 96.8 fL (ref 80.0–100.0)
Platelets: 243 10*3/uL (ref 150–400)
Platelets: 217 10*3/uL (ref 150–400)
RBC: 4.48 MIL/uL (ref 4.22–5.81)
RBC: 4.71 MIL/uL (ref 4.22–5.81)
RDW: 12.2 % (ref 11.5–15.5)
RDW: 12.3 % (ref 11.5–15.5)
WBC: 9.2 10*3/uL (ref 4.0–10.5)
WBC: 10 10*3/uL (ref 4.0–10.5)
nRBC: 0 % (ref 0.0–0.2)
nRBC: 0 % (ref 0.0–0.2)

## 2018-10-27 LAB — TROPONIN I (HIGH SENSITIVITY)
Troponin I (High Sensitivity): 9 ng/L (ref <18)
Troponin I (High Sensitivity): 8 ng/L (ref <18)

## 2018-10-27 LAB — URINALYSIS, ROUTINE W REFLEX MICROSCOPIC
Bilirubin Urine: NEGATIVE
Glucose, UA: NEGATIVE mg/dL
Ketones, ur: NEGATIVE mg/dL
Leukocytes,Ua: NEGATIVE
Nitrite: NEGATIVE
Protein, ur: NEGATIVE mg/dL
Specific Gravity, Urine: 1.01 (ref 1.005–1.030)
pH: 5 (ref 5.0–8.0)

## 2018-10-27 LAB — MAGNESIUM: Magnesium: 1.9 mg/dL (ref 1.7–2.4)

## 2018-10-27 LAB — PHOSPHORUS: Phosphorus: 2.5 mg/dL (ref 2.5–4.6)

## 2018-10-27 LAB — CREATININE, SERUM
Creatinine, Ser: 1.68 mg/dL — ABNORMAL HIGH (ref 0.61–1.24)
GFR calc Af Amer: 48 mL/min — ABNORMAL LOW (ref >60)
GFR calc non Af Amer: 41 mL/min — ABNORMAL LOW (ref >60)

## 2018-10-27 LAB — SARS CORONAVIRUS 2 (TAT 6-24 HRS): SARS Coronavirus 2: NEGATIVE

## 2018-10-27 LAB — BRAIN NATRIURETIC PEPTIDE: B Natriuretic Peptide: 531.9 pg/mL — ABNORMAL HIGH (ref 0.0–100.0)

## 2018-10-27 MED ORDER — SODIUM CHLORIDE 0.9 % IV SOLN: INTRAVENOUS | Status: DC

## 2018-10-27 MED ORDER — ASPIRIN EC 81 MG PO TBEC
81.0000 mg | DELAYED_RELEASE_TABLET | Freq: Every day | ORAL | Status: DC
  Administered 2018-10-27: 81 mg via ORAL
  Filled 2018-10-27: qty 1

## 2018-10-27 MED ORDER — ACETAMINOPHEN 325 MG PO TABS: 650.0000 mg | ORAL_TABLET | Freq: Four times a day (QID) | ORAL | Status: DC | PRN

## 2018-10-27 MED ORDER — MOMETASONE FURO-FORMOTEROL FUM 200-5 MCG/ACT IN AERO
2.0000 | INHALATION_SPRAY | Freq: Two times a day (BID) | RESPIRATORY_TRACT | Status: DC
  Filled 2018-10-27: qty 8.8

## 2018-10-27 MED ORDER — ACETAMINOPHEN 650 MG RE SUPP: 650.0000 mg | Freq: Four times a day (QID) | RECTAL | Status: DC | PRN

## 2018-10-27 MED ORDER — ENOXAPARIN SODIUM 40 MG/0.4ML ~~LOC~~ SOLN
40.0000 mg | SUBCUTANEOUS | Status: DC
  Filled 2018-10-27: qty 0.4

## 2018-10-27 MED ORDER — ALBUTEROL SULFATE (2.5 MG/3ML) 0.083% IN NEBU: 2.5000 mg | INHALATION_SOLUTION | Freq: Four times a day (QID) | RESPIRATORY_TRACT | Status: DC | PRN

## 2018-10-27 MED ORDER — SODIUM CHLORIDE 0.9 % IV BOLUS: 250.0000 mL | Freq: Once | INTRAVENOUS | Status: DC

## 2018-10-27 MED ORDER — FUROSEMIDE 20 MG PO TABS: 40.0000 mg | ORAL_TABLET | Freq: Every day | ORAL | Status: DC

## 2018-10-27 MED ORDER — ONDANSETRON HCL 4 MG PO TABS: 4.0000 mg | ORAL_TABLET | Freq: Four times a day (QID) | ORAL | Status: DC | PRN

## 2018-10-27 MED ORDER — ONDANSETRON HCL 4 MG/2ML IJ SOLN: 4.0000 mg | Freq: Four times a day (QID) | INTRAMUSCULAR | Status: DC | PRN

## 2018-10-27 NOTE — ED Notes (Signed)
This RN spoke to the admitting provider about the patients's concerns.

## 2018-10-27 NOTE — ED Notes (Signed)
Patient stating he wants to go home. " The beds are not comfortable and I can take lasix at home. I just want to be comfortable at home. If the doctor is not here in 15 minutes, I am taking my IVs out and leaving." Admitting provider made aware.

## 2018-10-27 NOTE — ED Provider Notes (Addendum)
[place] EMERGENCY DEPARTMENT Provider Note   CSN: [id number] Arrival date & time: 10/27/18  1134     History   Chief Complaint Chief Complaint  Patient presents with  . Shortness of Breath  . Hypotension    HPI Flores Barriere is a 68 y.o. male.     68 year old male presents with 3 days of dyspnea on exertion as well as orthopnea.  Patient has a history of CHF and states that he is noncompliant with medications until recently when he started taking his isosorbide.  Has had chest tightness due to trouble taking a deep breath.  Denies any actual anginal symptoms.  No new lower extremity edema.  No fever or chills.  No real cough.  Slight dizziness when standing.     Past Medical History:  Diagnosis Date  . Asthma   . CHF (congestive heart failure) ([place])   . Hypertension   . Prostate cancer [facility])     Patient Active Problem List   Diagnosis Date Noted  . Alcohol abuse 12/28/2017  . Glaucoma 12/28/2017  . Nonischemic cardiomyopathy ([place]) 10/17/2017  . History of pulmonary embolus (PE) 10/17/2017  . Pulmonary embolism ([place]) 02/28/2017  . Acute exacerbation of congestive heart failure ([place]) 02/24/2017  . CHF exacerbation ([place]) 02/24/2017  . Hypertensive urgency 02/24/2017  . Pulmonary edema 02/24/2017  . Acute kidney injury superimposed on chronic kidney disease ([place]) 02/24/2017  . Hypertensive heart disease 09/27/2016  . Tobacco abuse 09/27/2016  . Mixed hyperlipidemia 09/27/2016  . History of tobacco abuse 06/25/2016  . Allergic sinusitis 04/17/2016  . Asthma 01/19/2016  . CKD (chronic kidney disease) stage 2, GFR 60-89 ml/min 01/19/2016  . Aspiration pneumonitis ([place])   . Essential hypertension   . Heart failure with reduced ejection fraction ([place]) 01/10/2016  . Malignant neoplasm of prostate ([place]) 07/03/2013    Past Surgical History:  Procedure Laterality Date  . APPENDECTOMY    . HYDROCELE EXCISION Left 12/01/2015   Procedure: REPAIR  OF LEFT HYDROCELE;  Surgeon: Raynelle Bring, MD;  Location: [facility];  Service: Urology;  Laterality: Left;  . LYMPHADENECTOMY Bilateral 09/24/2013   Procedure: LYMPHADENECTOMY;  Surgeon: Raynelle Bring, MD;  Location: [facility] OR[facility];  Service: Urology;  Laterality: Bilateral;  . PROSTATE BIOPSY    . RIGHT/LEFT HEART CATH AND CORONARY ANGIOGRAPHY N/A 06/10/2016   Procedure: Right/Left Heart Cath and Coronary Angiography;  Surgeon: Jettie Booze, MD;  Location: [place] CV LAB;  Service: Cardiovascular;  Laterality: N/A;  . ROBOT ASSISTED LAPAROSCOPIC RADICAL PROSTATECTOMY N/A 09/24/2013   Procedure: ROBOTIC ASSISTED LAPAROSCOPIC RADICAL PROSTATECTOMY LEVEL 2;  Surgeon: Raynelle Bring, MD;  Location: [facility];  Service: Urology;  Laterality: N/A;        Home Medications    Prior to Admission medications   Medication Sig Start Date End Date Taking? Authorizing Provider  albuterol (PROVENTIL HFA;VENTOLIN HFA) 108 (90 Base) MCG/ACT inhaler Inhale 2 puffs into the lungs every 4 (four) hours as needed for wheezing or shortness of breath. 08/10/17   Horton, Barbette Hair, MD  albuterol (PROVENTIL) (2.5 MG/3ML) 0.083% nebulizer solution Take 3 mLs (2.5 mg total) by nebulization every 6 (six) hours as needed for wheezing or shortness of breath. 01/19/16   Milagros Loll, MD  amLODipine (NORVASC) 5 MG tablet Take 1 tablet by mouth daily 04/25/18   Imogene Burn, PA-C  carvedilol (COREG) 25 MG tablet Take 1 tablet (25 mg total) by mouth 2 (two) times daily. 04/25/18   Ermalinda Barrios  M, PA-C  colchicine 0.6 MG tablet Take 1 tablet (0.6 mg total) by mouth daily. 01/16/18   Edrick Kins, DPM  COMBIGAN 0.2-0.5 % ophthalmic solution Place 1 drop into both eyes daily. 07/15/16   [provider]  furosemide (LASIX) 40 MG tablet Take 1 tablet (40 mg total) by mouth daily. 04/25/18   Imogene Burn, PA-C  latanoprost (XALATAN) 0.005 % ophthalmic solution Place 1 drop into both eyes at bedtime.  03/04/16   [provider]  ondansetron (ZOFRAN ODT) 4 MG disintegrating tablet Take 1 tablet (4 mg total) by mouth every 8 (eight) hours as needed for nausea or vomiting. 05/03/18   Kinnie Feil, PA-C  SYMBICORT 160-4.5 MCG/ACT inhaler Inhale 1 puff into the lungs daily. 12/08/17   [provider]    Family History Family History  Problem Relation Age of Onset  . Hypertension Sister   . Cancer Neg Hx     Social History Social History   Tobacco Use  . Smoking status: Current Every Day Smoker    Packs/day: 0.25    Years: 30.00    Pack years: 7.50    Types: Cigarettes    Last attempt to quit: 12/24/2015    Years since quitting: 2.8  . Smokeless tobacco: Never Used  . Tobacco comment: STtses he has cut down from 2 PPD to 4 cigarettes per day  Substance Use Topics  . Alcohol use: Yes    Alcohol/week: 6.0 - 7.0 standard drinks    Types: 6 - 7 Shots of liquor per week    Comment: states 6-7 shots of liquor daily  . Drug use: No    Types: Marijuana     Allergies   Losartan   Review of Systems Review of Systems  All other systems reviewed and are negative.    Physical Exam Updated Vital Signs BP (!) 77/45   Pulse 68   Temp 98 F (36.7 C) (Oral)   Resp 18   SpO2 96%   Physical Exam Vitals signs and nursing note reviewed.  Constitutional:      General: He is not in acute distress.    Appearance: Normal appearance. He is well-developed. He is not toxic-appearing.  HENT:     Head: Normocephalic and atraumatic.  Eyes:     General: Lids are normal.     Conjunctiva/sclera: Conjunctivae normal.     Pupils: Pupils are equal, round, and reactive to light.  Neck:     Musculoskeletal: Normal range of motion and neck supple.     Thyroid: No thyroid mass.     Trachea: No tracheal deviation.  Cardiovascular:     Rate and Rhythm: Normal rate and regular rhythm.     Heart sounds: Normal heart sounds. No murmur. No gallop.   Pulmonary:     Effort: Pulmonary effort is  normal. No respiratory distress.     Breath sounds: Normal breath sounds. No stridor. No decreased breath sounds, wheezing, rhonchi or rales.  Abdominal:     General: Bowel sounds are normal. There is no distension.     Palpations: Abdomen is soft.     Tenderness: There is no abdominal tenderness. There is no rebound.  Musculoskeletal: Normal range of motion.        General: No tenderness.  Skin:    General: Skin is warm and dry.     Findings: No abrasion or rash.  Neurological:     Mental Status: He is alert and oriented  to person, place, and time.     GCS: GCS eye subscore is 4. GCS verbal subscore is 5. GCS motor subscore is 6.     Cranial Nerves: No cranial nerve deficit.     Sensory: No sensory deficit.  Psychiatric:        Speech: Speech normal.        Behavior: Behavior normal.      ED Treatments / Results  Labs (all labs ordered are listed, but only abnormal results are displayed) Labs Reviewed  BASIC METABOLIC PANEL  CBC  BRAIN NATRIURETIC PEPTIDE  TROPONIN I (HIGH SENSITIVITY)    EKG EKG Interpretation  Date/Time:  Friday October 27 2018 13:58:43 EDT Ventricular Rate:  81 PR Interval:    QRS Duration: 114 QT Interval:  403 QTC Calculation: 468 R Axis:   13 Text Interpretation:  Atrial fibrillation Borderline intraventricular conduction delay Confirmed by Lacretia Leigh ([id number]) on 10/27/2018 2:02:40 PM   Radiology No results found.  Procedures Procedures (including critical care time)  Medications Ordered in ED Medications  0.9 %  sodium chloride infusion (has no administration in time range)     Initial Impression / Assessment and Plan / ED Course  I have reviewed the triage vital signs and the nursing notes.  Pertinent labs & imaging results that were available during my care of the patient were reviewed by me and considered in my medical decision making (see chart for details).       pts chadvasc score is 3     This score is not  applicable to this patient. Components are not  calculated.    Patient is EKG A. fib which appears to be new. Patient usually hypotensive here which resolved without treatment.  Evidence of acute kidney injury as his creatinine is up to 2.  BNP elevated as well 2.  Will consult hospitalist for admission  Final Clinical Impressions(s) / ED Diagnoses   Final diagnoses:  None    ED Discharge Orders    None       Lacretia Leigh, MD 10/27/18 1350    Lacretia Leigh, MD 10/27/18 1406

## 2018-10-27 NOTE — ED Triage Notes (Signed)
Pt to ER for evaluation of shortness of breath, chest heaviness, neck pain, and hypotension noted on arrival at 88/59. Pt is a/o x4. Reports nausea and "feeling like I can't get a good breath." reports onset of symptoms 2 days ago. Hx of CHF.

## 2018-10-27 NOTE — ED Notes (Signed)
Placed pt dinner tray on his bedside table and he is setting on side of bed eating

## 2018-10-27 NOTE — ED Notes (Signed)
BP recheck 77/45

## 2018-10-27 NOTE — ED Notes (Signed)
EKG from triage did not cross over in the chart. EKG given to MD Zenia Resides

## 2018-10-27 NOTE — H&P (Signed)
History and Physical    Leonard [name] [id number] DOB: [birth year] DOA: 10/27/2018  PCP: Lucianne Lei, MD  Patient coming from: Home  I have personally briefly reviewed patient's old medical records in [place]  Chief Complaint: Shortness of breath since 3 days  HPI: Leonard [name] is a 68 y.o. male with medical history significant of systolic congestive heart failure with ejection fraction of 30 to 35%, hypertension, COPD/asthma presents to emergency department due to worsening shortness of breath since 3 days.  Patient reports that he has known exertional shortness of breath, associated with orthopnea, PND, denies association with leg swelling, palpitation, chest pain, headache, lightheadedness, dizziness, decreased appetite, urinary or sleep changes.  Has loose stool since 2 weeks which is getting better.  Denies association with mucus, blood, epigastric pain, nausea or vomiting.  Patient reports that he takes his medications every day but not on that same time.  Upon arrival: His blood pressure found to be low.  Reports that he was on isosorbide dinitrate which he was not taking since 42-month as he lost the bottle and found it yesterday.  Took 3 tablets yesterday and day before yesterday and 1 this morning.  Reports that his blood pressure is low is likely because of that medicine.    He lives with his girlfriend at home, smokes half pack of cigarettes per day, drinks 3 to 4 ounces of whiskey per day, denies illicit drug use.  He does not use walker or cane at home.  ED Course: Sitting comfortably, communicating well, not in acute distress.  Maintaining oxygen saturation in 90s on room air.  Blood pressure is slightly low.  Kidney function slightly worsened.  BNP is slightly elevated.  Troponin: Negative x1.  Chest x-ray is negative for fluid overload.  COVID-19 is pending.  Review of Systems: As per HPI otherwise negative.    Past Medical History:  Diagnosis Date  . Asthma    . CHF (congestive heart failure) ([place])   . Hypertension   . Prostate cancer [facility])     Past Surgical History:  Procedure Laterality Date  . APPENDECTOMY    . HYDROCELE EXCISION Left 12/01/2015   Procedure: REPAIR OF LEFT HYDROCELE;  Surgeon: Raynelle Bring, MD;  Location: [facility];  Service: Urology;  Laterality: Left;  . LYMPHADENECTOMY Bilateral 09/24/2013   Procedure: LYMPHADENECTOMY;  Surgeon: Raynelle Bring, MD;  Location: [facility];  Service: Urology;  Laterality: Bilateral;  . PROSTATE BIOPSY    . RIGHT/LEFT HEART CATH AND CORONARY ANGIOGRAPHY N/A 06/10/2016   Procedure: Right/Left Heart Cath and Coronary Angiography;  Surgeon: Jettie Booze, MD;  Location: [place] [facility];  Service: Cardiovascular;  Laterality: N/A;  . ROBOT ASSISTED LAPAROSCOPIC RADICAL PROSTATECTOMY N/A 09/24/2013   Procedure: ROBOTIC ASSISTED LAPAROSCOPIC RADICAL PROSTATECTOMY LEVEL 2;  Surgeon: Raynelle Bring, MD;  Location: [facility];  Service: Urology;  Laterality: N/A;     reports that he has been smoking cigarettes. He has a 7.50 pack-year smoking history. He has never used smokeless tobacco. He reports current alcohol use of about 6.0 - 7.0 standard drinks of alcohol per week. He reports that he does not use drugs.  Allergies  Allergen Reactions  . Losartan Rash and Other (See Comments)    Abdominal and leg rashes noted in 12/17    Family History  Problem Relation Age of Onset  . Hypertension Sister   . Cancer Neg Hx     Prior to Admission medications   Medication Sig Start Date  End Date Taking? Authorizing Provider  amLODipine (NORVASC) 5 MG tablet Take 1 tablet by mouth daily 04/25/18  Yes Imogene Burn, PA-C  carvedilol (COREG) 25 MG tablet Take 1 tablet (25 mg total) by mouth 2 (two) times daily. 04/25/18  Yes Imogene Burn, PA-C  dorzolamide-timolol (COSOPT) 22.3-6.8 MG/ML ophthalmic solution Place 1 drop into both eyes 2 (two) times daily. 08/05/18  Yes [provider]  ENTRESTO  97-103 MG Take 1 tablet by mouth 2 (two) times daily. 10/04/18  Yes [provider]  furosemide (LASIX) 40 MG tablet Take 1 tablet (40 mg total) by mouth daily. 04/25/18  Yes Imogene Burn, PA-C  latanoprost (XALATAN) 0.005 % ophthalmic solution Place 1 drop into both eyes at bedtime.  03/04/16  Yes [provider]  SYMBICORT 160-4.5 MCG/ACT inhaler Inhale 1 puff into the lungs daily. 12/08/17  Yes [provider]  albuterol (PROVENTIL HFA;VENTOLIN HFA) 108 (90 Base) MCG/ACT inhaler Inhale 2 puffs into the lungs every 4 (four) hours as needed for wheezing or shortness of breath. 08/10/17   Horton, Barbette Hair, MD  albuterol (PROVENTIL) (2.5 MG/3ML) 0.083% nebulizer solution Take 3 mLs (2.5 mg total) by nebulization every 6 (six) hours as needed for wheezing or shortness of breath. Patient not taking: Reported on 10/27/2018 01/19/16   Milagros Loll, MD  colchicine 0.6 MG tablet Take 1 tablet (0.6 mg total) by mouth daily. Patient not taking: Reported on 10/27/2018 01/16/18   Edrick Kins, DPM  ondansetron (ZOFRAN ODT) 4 MG disintegrating tablet Take 1 tablet (4 mg total) by mouth every 8 (eight) hours as needed for nausea or vomiting. Patient not taking: Reported on 10/27/2018 05/03/18   Kinnie Feil, [place]    Physical Exam: Vitals:   10/27/18 1245 10/27/18 1300 10/27/18 1331 10/27/18 1400  BP: 105/71 105/64 99/63 126/69  Pulse: 63 61 (!) 50 88  Resp: 16 16  16   Temp:      TempSrc:      SpO2: 98% 94% 97% 99%    Constitutional: NAD, calm, comfortable Vitals:   10/27/18 1245 10/27/18 1300 10/27/18 1331 10/27/18 1400  BP: 105/71 105/64 99/63 126/69  Pulse: 63 61 (!) 50 88  Resp: 16 16  16   Temp:      TempSrc:      SpO2: 98% 94% 97% 99%   constitutional l: Alert and oriented x3, communicating well, not in acute distress.   Eyes: PERRL, lids and conjunctivae normal ENMT: Mucous membranes are moist. Posterior pharynx clear of any exudate or lesions.Normal  dentition.  Neck: normal, supple, no masses, no thyromegaly Respiratory: clear to auscultation bilaterally, no wheezing, no crackles. Normal respiratory effort. No accessory muscle use.  Cardiovascular: Regular rate and rhythm, no murmurs / rubs / gallops. No extremity edema. 2+ pedal pulses. No carotid bruits.  Abdomen: no tenderness, no masses palpated. No hepatosplenomegaly. Bowel sounds positive.  Musculoskeletal: no clubbing / cyanosis. No joint deformity upper and lower extremities. Good ROM, no contractures. Normal muscle tone.  Skin: no rashes, lesions, ulcers. No induration Neurologic: CN 2-12 grossly intact. Sensation intact, DTR normal. Strength 5/5 in all 4.  Psychiatric: Normal judgment and insight. Alert and oriented x 3. Normal mood.    Labs on Admission: I have personally reviewed following labs and imaging studies  CBC: Recent Labs  Lab 10/27/18 1210  WBC 9.2  HGB 15.1  HCT 44.3  MCV 98.9  PLT 0000000   Basic Metabolic Panel: Recent Labs  Lab  10/27/18 1210  NA 140  K 3.7  CL 104  CO2 27  GLUCOSE 108*  BUN 18  CREATININE 2.02*  CALCIUM 9.0   GFR: CrCl cannot be calculated (Unknown ideal weight.). Liver Function Tests: No results for input(s): AST, ALT, ALKPHOS, BILITOT, PROT, ALBUMIN in the last 168 hours. No results for input(s): LIPASE, AMYLASE in the last 168 hours. No results for input(s): AMMONIA in the last 168 hours. Coagulation Profile: No results for input(s): INR, PROTIME in the last 168 hours. Cardiac Enzymes: No results for input(s): CKTOTAL, CKMB, CKMBINDEX, TROPONINI in the last 168 hours. BNP (last 3 results) No results for input(s): PROBNP in the last 8760 hours. HbA1C: No results for input(s): HGBA1C in the last 72 hours. CBG: No results for input(s): GLUCAP in the last 168 hours. Lipid Profile: No results for input(s): CHOL, HDL, LDLCALC, TRIG, CHOLHDL, LDLDIRECT in the last 72 hours. Thyroid Function Tests: No results for input(s):  TSH, T4TOTAL, FREET4, T3FREE, THYROIDAB in the last 72 hours. Anemia Panel: No results for input(s): VITAMINB12, FOLATE, FERRITIN, TIBC, IRON, RETICCTPCT in the last 72 hours. Urine analysis:    Component Value Date/Time   COLORURINE STRAW (A) 05/03/2018 1729   APPEARANCEUR CLEAR 05/03/2018 1729   LABSPEC 1.005 05/03/2018 1729   PHURINE 5.0 05/03/2018 1729   GLUCOSEU NEGATIVE 05/03/2018 1729   HGBUR MODERATE (A) 05/03/2018 1729   BILIRUBINUR NEGATIVE 05/03/2018 1729   KETONESUR NEGATIVE 05/03/2018 1729   PROTEINUR NEGATIVE 05/03/2018 1729   UROBILINOGEN 1.0 11/28/2006 1206   NITRITE NEGATIVE 05/03/2018 1729   LEUKOCYTESUR NEGATIVE 05/03/2018 1729    Radiological Exams on Admission: Dg Chest Port 1 View  Result Date: 10/27/2018 CLINICAL DATA:  Shortness of breath. EXAM: PORTABLE CHEST 1 VIEW COMPARISON:  08/10/2017. FINDINGS: Cardiomegaly with normal pulmonary vascularity. Mild right base subsegmental atelectasis/scarring. Mild right base pleural thickening, most likely scarring again noted. No pneumothorax. No acute bony abnormality. Chest stable from prior exam. IMPRESSION: 1. Mild right base subsegmental atelectasis and pleuroparenchymal scarring. Similar findings noted on prior exam. No acute abnormality identified. 2.  Cardiomegaly.  No pulmonary venous congestion. Electronically Signed   By: Marcello Moores  Register   On: 10/27/2018 12:52    EKG: Regular rhythm, no acute ST-T wave changes noted.  Assessment/Plan Principal Problem:   Acute kidney injury superimposed on chronic kidney disease (HCC) Active Problems:   Heart failure with reduced ejection fraction (HCC)   Alcohol abuse   Hypotension    Shortness of breath: -Unlikely secondary to CHF as patient's is not in fluid overload on physical exam.  proBNP is elevated this could be due to worsening kidney function.  Troponin x1-, will trend x2.  Chest x-ray is negative.  Patient is not anemic, afebrile, no leukocytosis. -Placed  patient under observation.   -Ordered echo, COVID-19: Pending. -Continued his home medication of Lasix 40 mg, aspirin -Strict INO's and daily weight.  Monitor electrolytes.  Check magnesium level.  Hypotension: -Secondary to nonadherence to medication - started taking isosorbide dinitrate for 3 days which he stopped 3 months ago. -We will hold his antihypertensive medication of Coreg, amlodipine and isosorbide dinitrate. -Monitor blood pressure closely.  Acute kidney injury: -Likely secondary to hypotension -Monitor kidney function closely.  Avoid nephrotoxic medication -Repeat BMP tomorrow morning.  Systolic congestive heart failure with ejection fraction of 30 to 35%: -Continue Lasix, aspirin. -Strict INO's and daily weight.  Patient not in fluid overload on physical exam. -We will get transthoracic echo  Tobacco abuse: -Counseled regarding  cessation and he verbalized understanding.  Alcohol abuse: -Counseled regarding cessation and he verbalized understanding.   DVT prophylaxis: Lovenox Code Status: Full code Family Communication: None present at bedside.  Plan of care discussed with patient in length and he verbalized understanding and agreed with it. Disposition Plan: TBD Consults called: None Admission status: Observation  Mckinley Jewel MD [facility] Pager [phone]  If 7PM-7AM, please contact night-coverage [email or web address] Password [facility]  10/27/2018, 2:14 PM

## 2018-10-27 NOTE — ED Notes (Addendum)
Pt reports found a bottle of isosorbide yesterday, has been lost for 3 months, and took 3 yesterday. Reports symptoms started yesterday.

## 2018-10-27 NOTE — ED Notes (Addendum)
This RN went to talk to patient and he was not in room. Both of his IV catheters were on the bed and intact. Hospital provider made aware. Discharged the patient AMA.

## 2018-10-28 LAB — HIV ANTIBODY (ROUTINE TESTING W REFLEX): HIV Screen 4th Generation wRfx: NONREACTIVE

## 2018-10-30 ENCOUNTER — Other Ambulatory Visit: Payer: Self-pay | Admitting: *Deleted

## 2018-10-30 NOTE — Patient Outreach (Signed)
[place] [facility]) Care Management  10/30/2018  Leonard [name] 03-27-1950 [id number]   RN Health Coach attempted follow up outreach call to patient.  Patient stated he was busy and could I call back at 12. RN will follow up with a later call.    [place] Care Management [phone]

## 2018-10-30 NOTE — Patient Outreach (Signed)
[place] [facility]) Care Management  10/30/2018  Bartlett Muhlenkamp 02-25-1950 [id number]   RN Health Coach attempted follow up outreach call to patient @ 12 as patient requested..  Patient was unavailable. HIPPA compliance voicemail message left with return callback number.  Plan: RN will call patient again within 30 days.  [place] Care Management [phone]

## 2018-11-29 ENCOUNTER — Other Ambulatory Visit: Payer: Self-pay | Admitting: *Deleted

## 2018-11-29 NOTE — Patient Outreach (Signed)
[facility]) Care Management  11/29/2018  Mostafa Huebsch 09-May-1950 [id number]  RN Health Coach attempted follow up outreach call to patient.  Patient was unavailable. HIPPA compliance voicemail message left with return callback number.  Plan: RN will call patient again within 30 days.  [place] Care Management [phone]

## 2018-12-29 ENCOUNTER — Other Ambulatory Visit: Payer: Self-pay | Admitting: *Deleted

## 2019-01-10 NOTE — Patient Outreach (Addendum)
[facility]) Care Management  [place]  12/29/2018 Late entry  Keir Traughber 1950/08/06 [id number]  [place] telephone call to patient.  [place] compliance verified. Per patient he is not having any swelling in feet, legs ,hands or abdomen. Patient is having some elevations in blood pressure. RN discussed with patient about monitoring blood pressure. Patient has not been weighing self daily. Patient is wearing mask and shield for pandemic precautions. Patient has agreed to further outreach calls.   Encounter Medications:  Outpatient Encounter Medications as of 12/29/2018  Medication Sig Note  . albuterol (PROVENTIL HFA;VENTOLIN HFA) 108 (90 Base) MCG/ACT inhaler Inhale 2 puffs into the lungs every 4 (four) hours as needed for wheezing or shortness of breath. 10/27/2018: Needs RF  . albuterol (PROVENTIL) (2.5 MG/3ML) 0.083% nebulizer solution Take 3 mLs (2.5 mg total) by nebulization every 6 (six) hours as needed for wheezing or shortness of breath. (Patient not taking: Reported on 10/27/2018)   . amLODipine (NORVASC) 5 MG tablet Take 1 tablet by mouth daily   . carvedilol (COREG) 25 MG tablet Take 1 tablet (25 mg total) by mouth 2 (two) times daily.   . colchicine 0.6 MG tablet Take 1 tablet (0.6 mg total) by mouth daily. (Patient not taking: Reported on 10/27/2018)   . dorzolamide-timolol (COSOPT) 22.3-6.8 MG/ML ophthalmic solution Place 1 drop into both eyes 2 (two) times daily.   Marland Kitchen ENTRESTO 97-103 MG Take 1 tablet by mouth 2 (two) times daily.   . furosemide (LASIX) 40 MG tablet Take 1 tablet (40 mg total) by mouth daily.   Marland Kitchen latanoprost (XALATAN) 0.005 % ophthalmic solution Place 1 drop into both eyes at bedtime.    . ondansetron (ZOFRAN ODT) 4 MG disintegrating tablet Take 1 tablet (4 mg total) by mouth every 8 (eight) hours as needed for nausea or vomiting. (Patient not taking: Reported on 10/27/2018)   . SYMBICORT 160-4.5 MCG/ACT inhaler Inhale 1 puff into  the lungs daily.    No facility-administered encounter medications on file as of 12/29/2018.     Functional Status:  In your present state of health, do you have any difficulty performing the following activities: 12/29/2018 08/28/2018  Hearing? N N  Vision? N N  Difficulty concentrating or making decisions? N N  Walking or climbing stairs? N N  Dressing or bathing? N N  Doing errands, shopping? N N  Preparing Food and eating ? N N  Using the Toilet? N N  In the past six months, have you accidently leaked urine? N N  Do you have problems with loss of bowel control? N N  Managing your Medications? N N  Managing your Finances? N N  Housekeeping or managing your Housekeeping? N N  Some recent data might be hidden    Fall/Depression Screening: Fall Risk  12/29/2018 08/28/2018 05/01/2018  Falls in the past year? 0 0 0  Number falls in past yr: 0 - -  Injury with Fall? 0 - -  Follow up Falls evaluation completed;Falls prevention discussed Falls evaluation completed Falls evaluation completed   PHQ 2/9 Scores 12/29/2018 08/28/2018 05/01/2018 03/03/2018 12/29/2017 12/28/2017 06/04/2016  PHQ - 2 Score 0 0 0 0 1 1 0   THN CM Care Plan Problem One     Most Recent Value  Care Plan Problem One  Knowledge Deficit in Self Management of CHF  Role Documenting the Problem One  Health Coach  [place] Term Goal   Patient will not have any  admissions for CHF within the next 90 days  THN Long Term Goal Start Date  12/29/18  Interventions for Problem One Long Term Goal  RN continues to reiterate the zones and encourage patient to do daily weights.RN will follow up with further discussion and monitor compliance  THN CM Short Term Goal #1   Patient will verbalize understanding symptoms of CHF within the next 30 days  THN CM Short Term Goal #1 Start Date  12/29/18  Interventions for Short Term Goal #1  RN reiterates the symptoms and how monitoring the weight helps to pick up on symptoms before they become  severe. RN will continue with further discussion  THN CM Short Term Goal #2   Patient will verbalize weighing daily and documenting within the next 30 days  THN CM Short Term Goal #2 Start Date  12/29/18  Interventions for Short Term Goal #2  RN is stressing the importance of weighing. RN is reiterating what the weight means when gaining over 1-2 days. RN will follow up for compliance  THN CM Short Term Goal #3  Patient will have a better understanding of low sodium diet within the next 30 days  THN CM Short Term Goal #3 Start Date  12/29/18  Interventions for Short Tern Goal #3  RN reiterates monitoring sodium intake. RN will follow up with further discussion to see if patient is compliant  THN CM Short Term Goal #4  Patient will monitor blood pressure and document within the next 30 days  THN CM Short Term Goal #4 Start Date  12/29/18  Interventions for Short Term Goal #4  RN discusse monitoring blood pressure since hypertension and runninf high. RN sent a sheet to explain how to monitor blood pressure and what it means. RN sent a matter of choice blood pressure control booklet. RN will follow up for further discussion       Assessment:  Patient is not weighing daily Patient blood pressure is elevated Patient will continue to  benefit from [place] telephonic outreach for education and support for CHF self management.  Plan:  RN sent patient copy of [facility] product catalog RN reiterated importance of weighing RN reiterated importance of monitoring blood pressure RN discussed symptoms of stroke RN sent educational material on how to read blood pressure RN sent a matter of choice blood pressure control RN sent 2021 Calendar book RN will follow up outreach within the month of February  Keonte [place] Management [phone]

## 2019-03-29 ENCOUNTER — Other Ambulatory Visit: Payer: Self-pay | Admitting: *Deleted

## 2019-03-30 NOTE — Patient Outreach (Signed)
[facility]) Care Management  [place]  03/29/2019  Leonard [name] 1950-04-12 [id number]  RN Health Coach telephone call to patient.  [place] compliance verified. Per patient he has not been checking his blood pressure or weighing. Patient has had some episodes of his lower extremities swelling. Per patient he has taken his medications. RN  went over the symptoms of a stroke. Patient has not been able to get on the waiting list for his COVID vaccine. Patient has agreed to further outreach calls.   Encounter Medications:  Outpatient Encounter Medications as of 03/29/2019  Medication Sig Note  . albuterol (PROVENTIL HFA;VENTOLIN HFA) 108 (90 Base) MCG/ACT inhaler Inhale 2 puffs into the lungs every 4 (four) hours as needed for wheezing or shortness of breath. 10/27/2018: Needs RF  . albuterol (PROVENTIL) (2.5 MG/3ML) 0.083% nebulizer solution Take 3 mLs (2.5 mg total) by nebulization every 6 (six) hours as needed for wheezing or shortness of breath. (Patient not taking: Reported on 10/27/2018)   . amLODipine (NORVASC) 5 MG tablet Take 1 tablet by mouth daily   . carvedilol (COREG) 25 MG tablet Take 1 tablet (25 mg total) by mouth 2 (two) times daily.   . colchicine 0.6 MG tablet Take 1 tablet (0.6 mg total) by mouth daily. (Patient not taking: Reported on 10/27/2018)   . dorzolamide-timolol (COSOPT) 22.3-6.8 MG/ML ophthalmic solution Place 1 drop into both eyes 2 (two) times daily.   Marland Kitchen ENTRESTO 97-103 MG Take 1 tablet by mouth 2 (two) times daily.   . furosemide (LASIX) 40 MG tablet Take 1 tablet (40 mg total) by mouth daily.   Marland Kitchen latanoprost (XALATAN) 0.005 % ophthalmic solution Place 1 drop into both eyes at bedtime.    . ondansetron (ZOFRAN ODT) 4 MG disintegrating tablet Take 1 tablet (4 mg total) by mouth every 8 (eight) hours as needed for nausea or vomiting. (Patient not taking: Reported on 10/27/2018)   . SYMBICORT 160-4.5 MCG/ACT inhaler Inhale 1 puff into the lungs  daily.    No facility-administered encounter medications on file as of 03/29/2019.    Functional Status:  In your present state of health, do you have any difficulty performing the following activities: 12/29/2018 08/28/2018  Hearing? N N  Vision? N N  Difficulty concentrating or making decisions? N N  Walking or climbing stairs? N N  Dressing or bathing? N N  Doing errands, shopping? N N  Preparing Food and eating ? N N  Using the Toilet? N N  In the past six months, have you accidently leaked urine? N N  Do you have problems with loss of bowel control? N N  Managing your Medications? N N  Managing your Finances? N N  Housekeeping or managing your Housekeeping? N N  Some recent data might be hidden    Fall/Depression Screening: Fall Risk  03/29/2019 12/29/2018 08/28/2018  Falls in the past year? 0 0 0  Number falls in past yr: 0 0 -  Injury with Fall? 0 0 -  Follow up Falls evaluation completed;Education provided Falls evaluation completed;Falls prevention discussed Falls evaluation completed   PHQ 2/9 Scores 12/29/2018 08/28/2018 05/01/2018 03/03/2018 12/29/2017 12/28/2017 06/04/2016  PHQ - 2 Score 0 0 0 0 1 1 0   THN CM Care Plan Problem One     Most Recent Value  Care Plan Problem One  Knowledge Deficit in Self Management of CHF  Role Documenting the Problem One  Lucien for Problem One  Active  THN Long Term Goal   Patient will not have any admissions for CHF within the next 90 days  THN Long Term Goal Start Date  03/30/19  Interventions for Problem One Long Term Goal  RN reiterated the zones of CHF. Patient has not been weighing. RNexplained to patient the importanceof monitoring the fluid sincehe isstill having swelling at intervals in lower extremities. RNwill follow up each outreach for compliance and discussion.  THN CM Short Term Goal #1   Patient will verbalize understanding symptoms of CHF within the next 30 days  THN CM Short Term Goal #1 Start Date   03/30/19  Interventions for Short Term Goal #1  RN discussed the patient symtoms that he is exhibiting and that he needsto monitor this and report to his physician. RN will follow up with further discussion  THN CM Short Term Goal #2   Patient will verbalize weighing daily and documenting within the next 30 days  THN CM Short Term Goal #2 Start Date  03/30/19  Interventions for Short Term Goal #2  RN reiterated to patient that he needs to monitor weight especially with still having intermittent swelling in extremities. RN will follow up with further discussion  THN CM Short Term Goal #3  Patient will have a better understanding of low sodium diet within the next 30 days  THN CM Short Term Goal #3 Met Date  03/30/19  [facility] CM Short Term Goal #4  Patient will monitor blood pressure and document within the next 30 days  THN CM Short Term Goal #4 Start Date  03/30/19  Interventions for Short Term Goal #4  RN checked for compliance of monitoring blood pressure. Patient has not been checking it. RN told the patient the complications of elevated blood pressure. RN reiterated the signs and symptoms of a stroke. RN will follow up with further discussion      Assessment:  Patient has been taking his medications as prescribed Patient has not been checking his blood pressure Patient has not been weighing Patient has not been able to schedule his COVID vaccine  Plan:  RN reiterated the importance of weighing RN reiterated the importance of checking his blood pressure at home RN assisted patient in getting signed up on COVID vaccine waiting list RN sent education on COVID RN sent EMMI education on Heart Failure keeping track of your weight RN sent EMMI education on Stroke and TIA RN sent EMMI education on Heart Attack RN will follow up outreach within the month of [place] [facility] [phone]

## 2019-04-07 ENCOUNTER — Ambulatory Visit: Payer: Medicare Other | Attending: Internal Medicine

## 2019-04-07 DIAGNOSIS — Z23 Encounter for immunization: Secondary | ICD-10-CM

## 2019-04-07 NOTE — Progress Notes (Signed)
   Covid-19 Vaccination Clinic  Name:  Leonard [name]    MRN: [id number] DOB: 1950-10-08  04/07/2019  [name] was observed post Covid-19 immunization for 15 minutes without incidence. He was provided with Vaccine Information Sheet and instruction to access the V-Safe system.   [name] was instructed to call 911 with any severe reactions post vaccine: Marland Kitchen Difficulty breathing  . Swelling of your face and throat  . A fast heartbeat  . A bad rash all over your body  . Dizziness and weakness    Immunizations Administered    Name Date Dose VIS Date Route   Pfizer COVID-19 Vaccine 04/07/2019 11:49 AM 0.3 mL 01/19/2019 Intramuscular   Manufacturer: [place]   Lot: [id number]   [place]: [id number]

## 2019-04-17 ENCOUNTER — Ambulatory Visit (INDEPENDENT_AMBULATORY_CARE_PROVIDER_SITE_OTHER): Payer: Medicare Other | Admitting: Interventional Cardiology

## 2019-04-17 ENCOUNTER — Encounter: Payer: Self-pay | Admitting: Interventional Cardiology

## 2019-04-17 ENCOUNTER — Other Ambulatory Visit: Payer: Self-pay

## 2019-04-17 VITALS — BP 162/82 | HR 85 | Ht 67.0 in | Wt 192.0 lb

## 2019-04-17 DIAGNOSIS — I110 Hypertensive heart disease with heart failure: Secondary | ICD-10-CM | POA: Diagnosis not present

## 2019-04-17 DIAGNOSIS — I428 Other cardiomyopathies: Secondary | ICD-10-CM

## 2019-04-17 DIAGNOSIS — I5022 Chronic systolic (congestive) heart failure: Secondary | ICD-10-CM

## 2019-04-17 DIAGNOSIS — I10 Essential (primary) hypertension: Secondary | ICD-10-CM

## 2019-04-17 DIAGNOSIS — Z720 Tobacco use: Secondary | ICD-10-CM

## 2019-04-17 NOTE — Progress Notes (Signed)
Cardiology Office Note   Date:  04/17/2019   ID:  Leonard [name], DOB 10-May-1950, MRN [id number]  PCP:  Lucianne Lei, MD    No chief complaint on file.  Chronic systolic heart failure  Wt Readings from Last 3 Encounters:  04/17/19 192 lb (87.1 kg)  07/13/18 187 lb (84.8 kg)  05/03/18 180 lb (81.6 kg)       History of Present Illness: Leonard [name] is a 69 y.o. male  with history of hypertension, hypertensive heart disease, nonischemic cardiomyopathy, tobacco abuse, who had an abnormal echo with EF of 40 to 45% and grade 1 DD followed by cardiac catheterization 06/2016 found to have 40% proximal RCA, 25% distal RCA, 20% mid LAD moderate LV systolic dysfunction ejection fraction 35 to 45% normal pulmonary artery pressures.Good blood pressure control was stressed. Patient has had a cough with ACE inhibitors in the past and a rash with losartan.  Follow-up 2D echo 02/2017 showed further decrease in LV function EF now 30 to 35% with diffuse hypokinesis and grade 1 DD,also had moderate AI and mild MR. Patient had been noncompliant with his medications.CT angiogram 02/28/2017 showed an acute pulmonary embolism in the right middle lobe and mild aneurysmal dilatation of the ascending thoracic aorta measuring 4.0 cm. We did not see him that hospitalization. He was sent home on Xarelto for 6 months. In the emergency room 08/10/2017 with COPD exacerbation.  In 10/18/2017, he had acute on chronic CHF after running out of his medications for about 2 months. I increased his Lasix to 80 mg daily for 3 days then back to 40 mg daily. I resumed his carvedilol Crestor.Creatinine was 1.35 that day. LDL 83.  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 He still has shortness of breath. He stopped taking potassium because of the size of the pill and is eating more bananas and potatoes. "  If he forgets to take his pills,  he feels volume overload.    He has not been exercising.  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  He has gained weight since last visit.   Denies : Chest pain. Dizziness. Leg edema. Nitroglycerin use.  Palpitations. Paroxysmal nocturnal dyspnea. Syncope.   He got his first COVID vaccine.   Past Medical History:  Diagnosis Date  . Asthma   . CHF (congestive heart failure) ([place])   . Hypertension   . Prostate cancer [facility])     Past Surgical History:  Procedure Laterality Date  . APPENDECTOMY    . HYDROCELE EXCISION Left 12/01/2015   Procedure: REPAIR OF LEFT HYDROCELE;  Surgeon: Raynelle Bring, MD;  Location: [facility];  Service: Urology;  Laterality: Left;  . LYMPHADENECTOMY Bilateral 09/24/2013   Procedure: LYMPHADENECTOMY;  Surgeon: Raynelle Bring, MD;  Location: [facility];  Service: Urology;  Laterality: Bilateral;  . PROSTATE BIOPSY    . RIGHT/LEFT HEART CATH AND CORONARY ANGIOGRAPHY N/A 06/10/2016   Procedure: Right/Left Heart Cath and Coronary Angiography;  Surgeon: Jettie Booze, MD;  Location: [place] [facility];  Service: Cardiovascular;  Laterality: N/A;  . ROBOT ASSISTED LAPAROSCOPIC RADICAL PROSTATECTOMY N/A 09/24/2013   Procedure: ROBOTIC ASSISTED LAPAROSCOPIC RADICAL PROSTATECTOMY LEVEL 2;  Surgeon: Raynelle Bring, MD;  Location: [facility];  Service: Urology;  Laterality: N/A;     Current Outpatient Medications  Medication Sig Dispense Refill  . albuterol (PROVENTIL HFA;VENTOLIN HFA) 108 (90 Base) MCG/ACT  inhaler Inhale 2 puffs into the lungs every 4 (four) hours as needed for wheezing or shortness of breath. 1 Inhaler 0  . albuterol (PROVENTIL) (2.5 MG/3ML) 0.083% nebulizer solution Take 3 mLs (2.5 mg total) by nebulization every 6 (six) hours as needed for wheezing or shortness of breath. 75 mL 2  . amLODipine (NORVASC) 5 MG tablet Take 1 tablet by mouth daily 90 tablet 1  . carvedilol (COREG) 25 MG tablet Take 1 tablet (25 mg total) by mouth 2 (two) times daily. 180 tablet 1  .  colchicine 0.6 MG tablet Take 1 tablet (0.6 mg total) by mouth daily. 14 tablet 0  . dorzolamide-timolol (COSOPT) 22.3-6.8 MG/ML ophthalmic solution Place 1 drop into both eyes 2 (two) times daily.    Marland Kitchen ENTRESTO 97-103 MG Take 1 tablet by mouth 2 (two) times daily.    . furosemide (LASIX) 40 MG tablet Take 1 tablet (40 mg total) by mouth daily. 90 tablet 1  . latanoprost (XALATAN) 0.005 % ophthalmic solution Place 1 drop into both eyes at bedtime.     . SYMBICORT 160-4.5 MCG/ACT inhaler Inhale 1 puff into the lungs daily.  3   No current facility-administered medications for this visit.    Allergies:   Losartan    Social History:  The patient  reports that he has been smoking cigarettes. He has a 7.50 pack-year smoking history. He has never used smokeless tobacco. He reports current alcohol use of about 6.0 - 7.0 standard drinks of alcohol per week. He reports that he does not use drugs.   Family History:  The patient's family history includes Hypertension in his sister.    ROS:  Please see the history of present illness.   Otherwise, review of systems are positive for congestion in his chest.   All other systems are reviewed and negative.    PHYSICAL EXAM: VS:  BP (!) 162/82   Pulse 85   Ht 5\' 7"  (1.702 m)   Wt 192 lb (87.1 kg)   SpO2 98%   BMI 30.07 kg/m  , BMI Body mass index is 30.07 kg/m. GEN: Well nourished, well developed, in no acute distress  HEENT: normal  Neck: no JVD, carotid bruits, or masses Cardiac: RRR; no murmurs, rubs, or gallops,no edema  Respiratory:  clear to auscultation bilaterally, normal work of breathing GI: soft, nontender, nondistended, + BS MS: no deformity or atrophy  Skin: warm and dry, no rash Neuro:  Strength and sensation are intact Psych: euthymic mood, full affect   EKG:   The ekg ordered 10/2018 demonstrates AFib, rate controlled   Recent Labs: 05/03/2018: ALT 41 10/27/2018: B Natriuretic Peptide 531.9; BUN 18; Creatinine, Ser 1.68;  Hemoglobin 16.3; Magnesium 1.9; Platelets 217; Potassium 3.7; Sodium 140   Lipid Panel    Component Value Date/Time   CHOL 183 10/18/2017 0843   TRIG 79 10/18/2017 0843   HDL 84 10/18/2017 0843   CHOLHDL 2.2 10/18/2017 0843   LDLCALC 83 10/18/2017 0843     Other studies Reviewed: Additional studies/ records that were reviewed today with results demonstrating: ER records from 10/2018 reviewed- he left AMA after he was supposed to be admitted.   ASSESSMENT AND PLAN:  1. NICM/Chronic systolic heart failure: Appears euvolemic but reports congestion.  Increase Lasix to 40 mg BID.  Repeat labs next week, BMet, BNP. 2. Hypertensive heart disease: He did not take his meds today.  THere have been issues with compliance.  3. PE: Treated with Xarelto in  the past. 4. Tobacco abuse: He needs to stop smoking. 5. Mixed hyperlipidemia: LDL 94 in 2/21.   6. PAF: It appeared he was in AFib in 10/2018.  He is now in NSR.  We discussed anticoagulation but he was hesitant.  I am also concerned about his compliance issues.  Will see if he has any palpitations.    Current medicines are reviewed at length with the patient today.  The patient concerns regarding his medicines were addressed.  The following changes have been made:  No change  Labs/ tests ordered today include:  No orders of the defined types were placed in this encounter.   Recommend 150 minutes/week of aerobic exercise Low fat, low carb, high fiber diet recommended  Disposition:   FU in 3 months   Signed, [name], MD  04/17/2019 4:30 PM    [facility] [address], [place], [place]  [address] Phone: [phone]; Fax: [phone]

## 2019-04-17 NOTE — Patient Instructions (Signed)
Medication Instructions:  Your physician has recommended you make the following change in your medication:   INCREASE: furosemide (lasix) to 40 mg twice a day for 3 days, then go back to once a day  *If you need a refill on your cardiac medications before your next appointment, please call your pharmacy*   Lab Work: Your physician recommends that you return for lab work (BMET and BNP) on 04/24/19  If you have labs (blood work) drawn today and your tests are completely normal, you will receive your results only by: Marland Kitchen MyChart Message (if you have MyChart) OR . A paper copy in the mail If you have any lab test that is abnormal or we need to change your treatment, we will call you to review the results.   Testing/Procedures: None ordered   Follow-Up: Follow up with Dr. Irish Lack on 07/20/19 at 4:00 PM  Other Instructions

## 2019-04-24 ENCOUNTER — Other Ambulatory Visit: Payer: Self-pay

## 2019-04-24 ENCOUNTER — Other Ambulatory Visit: Payer: Self-pay | Admitting: Family Medicine

## 2019-04-24 ENCOUNTER — Other Ambulatory Visit: Payer: Medicare Other

## 2019-04-24 ENCOUNTER — Ambulatory Visit
Admission: RE | Admit: 2019-04-24 | Discharge: 2019-04-24 | Disposition: A | Discharge status: Home / Self Care | Payer: Medicare Other | Source: Ambulatory Visit | Attending: Family Medicine | Admitting: Family Medicine

## 2019-04-24 DIAGNOSIS — I5022 Chronic systolic (congestive) heart failure: Secondary | ICD-10-CM

## 2019-04-24 DIAGNOSIS — M549 Dorsalgia, unspecified: Secondary | ICD-10-CM

## 2019-04-24 DIAGNOSIS — Z720 Tobacco use: Secondary | ICD-10-CM

## 2019-04-24 DIAGNOSIS — I428 Other cardiomyopathies: Secondary | ICD-10-CM

## 2019-04-24 DIAGNOSIS — I10 Essential (primary) hypertension: Secondary | ICD-10-CM

## 2019-04-24 DIAGNOSIS — I110 Hypertensive heart disease with heart failure: Secondary | ICD-10-CM

## 2019-04-25 ENCOUNTER — Other Ambulatory Visit: Payer: Self-pay

## 2019-04-25 LAB — BASIC METABOLIC PANEL
BUN/Creatinine Ratio: 10 (ref 10–24)
BUN: 11 mg/dL (ref 8–27)
CO2: 28 mmol/L (ref 20–29)
Calcium: 9.4 mg/dL (ref 8.6–10.2)
Chloride: 99 mmol/L (ref 96–106)
Creatinine, Ser: 1.13 mg/dL (ref 0.76–1.27)
GFR calc Af Amer: 76 mL/min/{1.73_m2} (ref >59)
GFR calc non Af Amer: 66 mL/min/{1.73_m2} (ref >59)
Glucose: 95 mg/dL (ref 65–99)
Potassium: 3.3 mmol/L — ABNORMAL LOW (ref 3.5–5.2)
Sodium: 144 mmol/L (ref 134–144)

## 2019-04-25 LAB — PRO B NATRIURETIC PEPTIDE: NT-Pro BNP: 23 pg/mL (ref 0–376)

## 2019-04-25 MED ORDER — FUROSEMIDE 40 MG PO TABS: 40.0000 mg | ORAL_TABLET | Freq: Every day | ORAL | 3 refills | Status: DC

## 2019-04-28 ENCOUNTER — Ambulatory Visit: Payer: Medicare Other | Attending: Internal Medicine

## 2019-04-28 DIAGNOSIS — Z23 Encounter for immunization: Secondary | ICD-10-CM

## 2019-04-28 NOTE — Progress Notes (Signed)
   Covid-19 Vaccination Clinic  Name:  Toderick Ziman    MRN: [id number] DOB: 1950-02-09  04/28/2019  Mr. Barb was observed post Covid-19 immunization for 15 minutes without incident. He was provided with Vaccine Information Sheet and instruction to access the V-Safe system.   Mr. Maley was instructed to call 911 with any severe reactions post vaccine: Marland Kitchen Difficulty breathing  . Swelling of face and throat  . A fast heartbeat  . A bad rash all over body  . Dizziness and weakness   Immunizations Administered    Name Date Dose VIS Date Route   Pfizer COVID-19 Vaccine 04/28/2019 10:14 AM 0.3 mL 01/19/2019 Intramuscular   Manufacturer: [place]   Lot: [id number]   [place]: [id number]

## 2019-05-02 ENCOUNTER — Ambulatory Visit: Payer: Medicare Other

## 2019-05-14 ENCOUNTER — Ambulatory Visit: Payer: Medicare Other

## 2019-06-27 ENCOUNTER — Telehealth: Payer: Self-pay | Admitting: Physician Assistant

## 2019-06-27 ENCOUNTER — Other Ambulatory Visit: Payer: Self-pay | Admitting: *Deleted

## 2019-06-27 MED ORDER — CARVEDILOL 25 MG PO TABS: 25.0000 mg | ORAL_TABLET | Freq: Two times a day (BID) | ORAL | 3 refills | Status: DC

## 2019-06-27 MED ORDER — FUROSEMIDE 40 MG PO TABS: 40.0000 mg | ORAL_TABLET | Freq: Every day | ORAL | 3 refills | Status: DC

## 2019-06-27 NOTE — Patient Outreach (Signed)
[facility]) Care Management  06/27/2019  [name] 1950-04-07 [id number]   RN Health Coach attempted follow up outreach call to patient.  Patient was driving and stated that he would call me back later today.  Plan: Patient will return call later today  [place] [facility] [phone]

## 2019-06-27 NOTE — Telephone Encounter (Signed)
*  STAT* If patient is at the pharmacy, call can be transferred to refill team.   1. Which medications need to be refilled? (please list name of each medication and dose if known) new prescriptions for Furosemide and Carvedilol  2. Which pharmacy/location (including street and city if local pharmacy) is medication to be sent to? [facility]- please fax to [phone]  3. Do they need a 30 day or 90 day supply? 90 days and refills

## 2019-06-27 NOTE — Telephone Encounter (Signed)
Pt's medications were sent to pt's pharmacy as requested. Confirmation received.  

## 2019-07-01 NOTE — Progress Notes (Deleted)
Cardiology Office Note   Date:  07/01/2019   ID:  Leonard [name], DOB [birth year], MRN [id number]  PCP:  Leonard [name]    No chief complaint on file.  Chronic systolic heart failure  Wt Readings from Last 3 Encounters:  04/17/19 192 lb (87.1 kg)  07/13/18 187 lb (84.8 kg)  05/03/18 180 lb (81.6 kg)       History of Present Illness: Leonard [name] is a 69 y.o. male  with history of hypertension, hypertensive heart disease, nonischemic cardiomyopathy, tobacco abuse, who had an abnormal echo with EF of 40 to 45% and grade 1 DD followed by cardiac catheterization 06/2016 found to have 40% proximal RCA, 25% distal RCA, 20% mid LAD moderate LV systolic dysfunction ejection fraction 35 to 45% normal pulmonary artery pressures.Good blood pressure control was stressed. Patient has had a cough with ACE inhibitors in the past and a rash with losartan.  Follow-up 2D echo 02/2017 showed further decrease in LV function EF now 30 to 35% with diffuse hypokinesis and grade 1 DD,also had moderate AI and mild MR. Patient had been noncompliant with his medications.CT angiogram 02/28/2017 showed an acute pulmonary embolism in the right middle lobe and mild aneurysmal dilatation of the ascending thoracic aorta measuring 4.0 cm. We did not see him that hospitalization. He was sent home on Xarelto for 6 months. In the emergency room 08/10/2017 with COPD exacerbation.  In9/11/2017, "he hadacute on chronic CHF after running out of his medications for about 2 months. I increased his Lasix to 80 mg daily for 3 days then back to 40 mg daily. I resumed his carvedilol Crestor.Creatinine was 1.35 that day. 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 He still has shortness of breath. He stopped taking potassium because of the size of the pill and is eating more bananas and potatoes."  If he forgets to take his  pills, he feels volume overload.   He has not been exercising. 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 He has gained weight.  At his last visit in March 2021, he felt congested.  Lasix was increased to 40 mg twice daily.    Past Medical History:  Diagnosis Date  . Asthma   . CHF (congestive heart failure) ([place])   . Hypertension   . Prostate cancer [facility])     Past Surgical History:  Procedure Laterality Date  . APPENDECTOMY    . HYDROCELE EXCISION Left 12/01/2015   Procedure: REPAIR OF LEFT HYDROCELE;  Surgeon: Leonard Bring, [name];  [facility];  Service: Urology;  Laterality: Left;  . LYMPHADENECTOMY Bilateral 09/24/2013   Procedure: LYMPHADENECTOMY;  Surgeon: Leonard Bring, [name];  [facility];  Service: Urology;  Laterality: Bilateral;  . PROSTATE BIOPSY    . RIGHT/LEFT HEART CATH AND CORONARY ANGIOGRAPHY N/A 06/10/2016   Procedure: Right/Left Heart Cath and Coronary Angiography;  Surgeon: Leonard Booze, [name];  Location: [place] [facility];  Service: Cardiovascular;  Laterality: N/A;  . ROBOT ASSISTED LAPAROSCOPIC RADICAL PROSTATECTOMY N/A 09/24/2013   Procedure: ROBOTIC ASSISTED LAPAROSCOPIC RADICAL PROSTATECTOMY LEVEL 2;  Surgeon: Leonard Bring, [name];  [facility];  Service: Urology;  Laterality: N/A;     Current Outpatient Medications  Medication Sig Dispense Refill  . albuterol (PROVENTIL HFA;VENTOLIN HFA) 108 (90 Base) MCG/ACT inhaler Inhale 2 puffs into the lungs every 4 (four) hours as needed  for wheezing or shortness of breath. 1 Inhaler 0  . albuterol (PROVENTIL) (2.5 MG/3ML) 0.083% nebulizer solution Take 3 mLs (2.5 mg total) by nebulization every 6 (six) hours as needed for wheezing or shortness of breath. 75 mL 2  . amLODipine (NORVASC) 5 MG tablet Take 1 tablet by mouth daily 90 tablet 1  . carvedilol (COREG) 25 MG tablet Take 1 tablet (25 mg total) by mouth 2 (two) times daily. 180 tablet 3  . colchicine 0.6 MG tablet Take 1 tablet (0.6 mg total) by mouth  daily. 14 tablet 0  . dorzolamide-timolol (COSOPT) 22.3-6.8 MG/ML ophthalmic solution Place 1 drop into both eyes 2 (two) times daily.    Marland Kitchen ENTRESTO 97-103 MG Take 1 tablet by mouth 2 (two) times daily.    . furosemide (LASIX) 40 MG tablet Take 1 tablet (40 mg total) by mouth daily. You may take an extra tablet in the PM AS NEEDED for swelling up to twice a week 120 tablet 3  . latanoprost (XALATAN) 0.005 % ophthalmic solution Place 1 drop into both eyes at bedtime.     . SYMBICORT 160-4.5 MCG/ACT inhaler Inhale 1 puff into the lungs daily.  3   No current facility-administered medications for this visit.    Allergies:   Losartan    Social History:  The patient  reports that he has been smoking cigarettes. He has a 7.50 pack-year smoking history. He has never used smokeless tobacco. He reports current alcohol use of about 6.0 - 7.0 standard drinks of alcohol per week. He reports that he does not use drugs.   Family History:  The patient's ***family history includes Hypertension in his sister.    ROS:  Please see the history of present illness.   Otherwise, review of systems are positive for ***.   All other systems are reviewed and negative.    PHYSICAL EXAM: VS:  There were no vitals taken for this visit. , BMI There is no height or weight on file to calculate BMI. GEN: Well nourished, well developed, in no acute distress  HEENT: normal  Neck: no JVD, carotid bruits, or masses Cardiac: ***RRR; no murmurs, rubs, or gallops,no edema  Respiratory:  clear to auscultation bilaterally, normal work of breathing GI: soft, nontender, nondistended, + BS MS: no deformity or atrophy  Skin: warm and dry, no rash Neuro:  Strength and sensation are intact Psych: euthymic mood, full affect   EKG:   The ekg ordered today demonstrates ***   Recent Labs: 10/27/2018: B Natriuretic Peptide 531.9; Hemoglobin 16.3; Magnesium 1.9; Platelets 217 04/24/2019: BUN 11; Creatinine, Ser 1.13; NT-Pro BNP 23;  Potassium 3.3; Sodium 144   Lipid Panel    Component Value Date/Time   CHOL 183 10/18/2017 0843   TRIG 79 10/18/2017 0843   HDL 84 10/18/2017 0843   CHOLHDL 2.2 10/18/2017 0843   LDLCALC 83 10/18/2017 0843     Other studies Reviewed: Additional studies/ records that were reviewed today with results demonstrating: ***.   ASSESSMENT AND PLAN:  1. NICM/chronic systolic heart failure: Entresto along with other medical therapy to help with low ejection fraction. 2. Hypertensive heart disease: Low-salt diet.  Whole food, plant-based diet recommended. 3. Pulmonary embolism: He was treated with Xarelto in the past. 4. Paroxysmal atrial fibrillation: He had some atrial fibrillation back in September 2020.  It had not persisted. 5. Tobacco abuse: He needs to stop smoking. 6. Hyperlipidemia:   Current medicines are reviewed at length with the patient today.  The patient concerns regarding his medicines were addressed.  The following changes have been made:  No change***  Labs/ tests ordered today include: *** No orders of the defined types were placed in this encounter.   Recommend 150 minutes/week of aerobic exercise Low fat, low carb, high fiber diet recommended  Disposition:   FU in ***   Signed, [name], [name]  07/01/2019 9:46 PM    [facility] [address], [place], [place]  [address] Phone: [phone]; Fax: [phone]

## 2019-07-02 ENCOUNTER — Ambulatory Visit: Payer: Medicare Other | Admitting: Interventional Cardiology

## 2019-07-20 ENCOUNTER — Ambulatory Visit: Payer: Medicare Other | Admitting: Interventional Cardiology

## 2019-07-27 ENCOUNTER — Other Ambulatory Visit: Payer: Self-pay | Admitting: *Deleted

## 2019-07-27 NOTE — Patient Outreach (Signed)
[place] [facility]) Care Management  [place]  07/27/2019   Leonard [name] 06/30/1950 [id number]  RN Health Coach telephone call to patient.  Hipaa compliance verified. Patient blood pressure was elevated last visit to Dr 162/82. Patient has not been monitoring . Patient has not been monitoring his weight. Patient weight was elevated at Dr office to 192. Dr increased the patient lasix. RN discussed with patient about medication adherence and patient stated that sometimes he has not take it as her should. Patient did receive COVID vaccine. Patient does not have a medical alert system and wants to think about getting one. Patient not has running transportation. Patient just moved to new apartment. Patient has agreed to follow up outreach calls.   Encounter Medications:  Outpatient Encounter Medications as of 07/27/2019  Medication Sig  . albuterol (PROVENTIL HFA;VENTOLIN HFA) 108 (90 Base) MCG/ACT inhaler Inhale 2 puffs into the lungs every 4 (four) hours as needed for wheezing or shortness of breath.  Marland Kitchen albuterol (PROVENTIL) (2.5 MG/3ML) 0.083% nebulizer solution Take 3 mLs (2.5 mg total) by nebulization every 6 (six) hours as needed for wheezing or shortness of breath.  Marland Kitchen amLODipine (NORVASC) 5 MG tablet Take 1 tablet by mouth daily  . carvedilol (COREG) 25 MG tablet Take 1 tablet (25 mg total) by mouth 2 (two) times daily.  . colchicine 0.6 MG tablet Take 1 tablet (0.6 mg total) by mouth daily.  . dorzolamide-timolol (COSOPT) 22.3-6.8 MG/ML ophthalmic solution Place 1 drop into both eyes 2 (two) times daily.  Marland Kitchen ENTRESTO 97-103 MG Take 1 tablet by mouth 2 (two) times daily.  . furosemide (LASIX) 40 MG tablet Take 1 tablet (40 mg total) by mouth daily. You may take an extra tablet in the PM AS NEEDED for swelling up to twice a week  . latanoprost (XALATAN) 0.005 % ophthalmic solution Place 1 drop into both eyes at bedtime.   . SYMBICORT 160-4.5 MCG/ACT inhaler Inhale 1 puff  into the lungs daily.   No facility-administered encounter medications on file as of 07/27/2019.    Functional Status:  In your present state of health, do you have any difficulty performing the following activities: 12/29/2018 08/28/2018  Hearing? N N  Vision? N N  Difficulty concentrating or making decisions? N N  Walking or climbing stairs? N N  Dressing or bathing? N N  Doing errands, shopping? N N  Preparing Food and eating ? N N  Using the Toilet? N N  In the past six months, have you accidently leaked urine? N N  Do you have problems with loss of bowel control? N N  Managing your Medications? N N  Managing your Finances? N N  Housekeeping or managing your Housekeeping? N N  Some recent data might be hidden    Fall/Depression Screening: Fall Risk  03/29/2019 12/29/2018 08/28/2018  Falls in the past year? 0 0 0  Number falls in past yr: 0 0 -  Injury with Fall? 0 0 -  Follow up Falls evaluation completed;Education provided Falls evaluation completed;Falls prevention discussed Falls evaluation completed   PHQ 2/9 Scores 12/29/2018 08/28/2018 05/01/2018 03/03/2018 12/29/2017 12/28/2017 06/04/2016  PHQ - 2 Score 0 0 0 0 1 1 0   THN CM Care Plan Problem One     Most Recent Value  Care Plan Problem One Knowledge Deficit in Self Management of CHF  Role Documenting the Problem One [place] for Problem One Active  THN Long Term Goal  Patient will not have any admissions for CHF within the next 90 days  Interventions for Problem One Long Term Goal RN reiterated medication adherence.  THN CM Short Term Goal #1  Patient will verbalize understanding symptoms and complications of CHF within the next 30 days  THN CM Short Term Goal #1 Start Date 07/27/19  Interventions for Short Term Goal #1 RN discussed the complications of CHF. RN went over tthe information of Stroke, heart attack, renal problems and eye problems. RN will follow up with further discussion  THN CM Short Term  Goal #2  Patient will verbalize weighing daily and documenting within the next 30 days  Interventions for Short Term Goal #2 RN discussed with patient monitoring daily weights. RN reiterated the call Dr if 3 lbs in a day and 5 pounds in a week. Patient DDr notes on exam beccause patient has been non compliant and increased his lasix. RN will follow up for compliance  THN CM Short Term Goal #3 Patient will have a better understanding of low sodium diet within the next 30 days  THN CM Short Term Goal #3 Start Date 07/27/19  [reinstated]  Interventions for Short Tern Goal #3 RN discussed low sodium diet . RN noted increase in patient weight and Dr having to increase his lasix. RN explained what problems not adhering to the diet can cause. RN sent Planning healthy meal information. RN will follow up for further discussion.      Assessment:  Patient has not adhered to medication as per ordered Patient weight increased to 192 Dr increased patient lasix last outreach Patient has not been monitoring weight or blood pressure at home  Patient has not been adhering to diet Plan:  RN discussed medication adherence RN reiterated the complications of uncontrolled HTN and CHF RN sent education material on Exercise Program Activity Booklet RN sent healthy meal planning education RN will follow up within the month of August RN sent assessment update to PCP  [place] Management [phone]

## 2019-08-22 DIAGNOSIS — I5022 Chronic systolic (congestive) heart failure: Secondary | ICD-10-CM | POA: Diagnosis not present

## 2019-08-22 DIAGNOSIS — J441 Chronic obstructive pulmonary disease with (acute) exacerbation: Secondary | ICD-10-CM | POA: Diagnosis not present

## 2019-08-22 DIAGNOSIS — I10 Essential (primary) hypertension: Secondary | ICD-10-CM | POA: Diagnosis not present

## 2019-09-07 DIAGNOSIS — I110 Hypertensive heart disease with heart failure: Secondary | ICD-10-CM | POA: Diagnosis not present

## 2019-09-07 DIAGNOSIS — I5022 Chronic systolic (congestive) heart failure: Secondary | ICD-10-CM | POA: Diagnosis not present

## 2019-09-07 DIAGNOSIS — J449 Chronic obstructive pulmonary disease, unspecified: Secondary | ICD-10-CM | POA: Diagnosis not present

## 2019-09-07 DIAGNOSIS — Z720 Tobacco use: Secondary | ICD-10-CM | POA: Diagnosis not present

## 2019-09-24 ENCOUNTER — Other Ambulatory Visit: Payer: Self-pay | Admitting: Physician Assistant

## 2019-09-25 MED ORDER — FUROSEMIDE 40 MG PO TABS: 40.0000 mg | ORAL_TABLET | Freq: Every day | ORAL | 2 refills | Status: DC

## 2019-09-25 MED ORDER — CARVEDILOL 25 MG PO TABS: 25.0000 mg | ORAL_TABLET | Freq: Two times a day (BID) | ORAL | 2 refills | Status: DC

## 2019-09-26 ENCOUNTER — Ambulatory Visit: Payer: Self-pay | Admitting: *Deleted

## 2019-09-26 ENCOUNTER — Other Ambulatory Visit: Payer: Self-pay | Admitting: *Deleted

## 2019-09-26 NOTE — Patient Outreach (Signed)
[name] [facility]) Care Management  09/26/2019  Leonard [name] 11-14-1950 [id number]   RN Health Coach attempted follow up outreach call to patient.  Patient was unavailable. HIPPA compliance voicemail message left with return callback number.  Plan: RN will call patient again within 30 days.  [place] Care Management [phone]

## 2019-09-28 ENCOUNTER — Encounter: Payer: Self-pay | Admitting: *Deleted

## 2019-10-01 NOTE — Telephone Encounter (Signed)
This encounter was created in error - please disregard.

## 2019-10-09 DIAGNOSIS — J449 Chronic obstructive pulmonary disease, unspecified: Secondary | ICD-10-CM | POA: Diagnosis not present

## 2019-10-09 DIAGNOSIS — I5022 Chronic systolic (congestive) heart failure: Secondary | ICD-10-CM | POA: Diagnosis not present

## 2019-10-09 DIAGNOSIS — I110 Hypertensive heart disease with heart failure: Secondary | ICD-10-CM | POA: Diagnosis not present

## 2019-10-09 DIAGNOSIS — Z720 Tobacco use: Secondary | ICD-10-CM | POA: Diagnosis not present

## 2019-10-26 ENCOUNTER — Other Ambulatory Visit: Payer: Self-pay | Admitting: *Deleted

## 2019-10-26 NOTE — Patient Outreach (Signed)
[place] [facility]) Care Management  10/26/2019  Leonard [name] 03/13/1950 [id number]  RN Health Coach telephone call to patient.  Hipaa compliance verified. Patient is out of town visiting sister and would like a call back next week.   [place] Care Management [phone]

## 2019-10-31 ENCOUNTER — Other Ambulatory Visit: Payer: Self-pay | Admitting: *Deleted

## 2019-10-31 NOTE — Patient Outreach (Signed)
Leonard [name]  [place]  10/31/2019   Leonard [name] 1950-06-14 [id number]  RN Health Coach telephone call to patient.  Hipaa compliance verified. Per patient he is doing better adhering to his low sodium diet. Patient stated that he is eating more seafood. Discussed with patient preparation of seafood to make sure the seasoning has decrease salt. Patient stated he is having very little swelling in extremities. Patient has not been monitoring his weight or blood pressure as often as he should. Patient stating he is taking all his medications as directed . Patient has agreed to follow up outreach calls.  Encounter Medications:  Outpatient Encounter Medications as of 10/31/2019  Medication Sig  . albuterol (PROVENTIL HFA;VENTOLIN HFA) 108 (90 Base) MCG/ACT inhaler Inhale 2 puffs into the lungs every 4 (four) hours as needed for wheezing or shortness of breath.  Marland Kitchen albuterol (PROVENTIL) (2.5 MG/3ML) 0.083% nebulizer solution Take 3 mLs (2.5 mg total) by nebulization every 6 (six) hours as needed for wheezing or shortness of breath.  Marland Kitchen amLODipine (NORVASC) 5 MG tablet Take 1 tablet by mouth daily  . carvedilol (COREG) 25 MG tablet Take 1 tablet (25 mg total) by mouth 2 (two) times daily.  . colchicine 0.6 MG tablet Take 1 tablet (0.6 mg total) by mouth daily.  . dorzolamide-timolol (COSOPT) 22.3-6.8 MG/ML ophthalmic solution Place 1 drop into both eyes 2 (two) times daily.  Marland Kitchen ENTRESTO 97-103 MG Take 1 tablet by mouth 2 (two) times daily.  . furosemide (LASIX) 40 MG tablet Take 1 tablet (40 mg total) by mouth daily. You may take an extra tablet in the PM AS NEEDED for swelling up to twice a week  . latanoprost (XALATAN) 0.005 % ophthalmic solution Place 1 drop into both eyes at bedtime.   . SYMBICORT 160-4.5 MCG/ACT inhaler Inhale 1 puff into the lungs daily.   No facility-administered encounter medications on file as of 10/31/2019.    Functional Status:   In your present state of health, do you have any difficulty performing the following activities: 12/29/2018  Hearing? N  Vision? N  Difficulty concentrating or making decisions? N  Walking or climbing stairs? N  Dressing or bathing? N  Doing errands, shopping? N  Preparing Food and eating ? N  Using the Toilet? N  In the past six months, have you accidently leaked urine? N  Do you have problems with loss of bowel control? N  Managing your Medications? N  Managing your Finances? N  Housekeeping or managing your Housekeeping? N  Some recent data might be hidden    Fall/Depression Screening: Fall Risk  10/31/2019 03/29/2019 12/29/2018  Falls in the past year? 0 0 0  Number falls in past yr: 0 0 0  Injury with Fall? 0 0 0  Follow up Falls evaluation completed Falls evaluation completed;Education provided Falls evaluation completed;Falls prevention discussed   PHQ 2/9 Scores 12/29/2018 08/28/2018 05/01/2018 03/03/2018 12/29/2017 12/28/2017 06/04/2016  PHQ - 2 Score 0 0 0 0 1 1 0   Goals Addressed            This Visit's Progress   . no admissions for CHF exacerbation       CARE PLAN ENTRY (see longitudinal plan of care for additional care plan information)   Current Barriers:  Marland Kitchen Knowledge deficit related to basic heart failure pathophysiology and self care [name] . Financial strain  Case Manager Clinical Goal(s):  Marland Kitchen Over the next 90 days, patient will verbalize  understanding of Heart Failure Action Plan and when to call doctor . Over the next 90 days, patient will take all Heart Failure mediations as prescribed . Over the next 90 days, patient will weigh daily and record (notifying MD of 3 lb weight gain over night or 5 lb in a week) . Patient will verbalize receiving annual flu vaccine within the next 90 days.   Interventions:  . Provided verbal education on low sodium diet . Provided written education on low sodium diet . Reviewed Heart Failure Action Plan in depth and  provided written copy . Discussed importance of daily weight and advised patient to weigh and record daily . Reviewed role of diuretics in prevention of fluid overload and [name] of heart failure  Patient Self Care Activities:  . Takes Heart Failure Medications as prescribed . Weighs daily and record (notifying MD of 3 lb weight gain over night or 5 lb in a week) . Verbalizes understanding of and follows CHF Action Plan . Adheres to low sodium diet  Initial goal documentation       Assessment:  Patient is taking medications as per ordered Patient has not been weighing or monitoring blood pressure as he should Patient is adhering better to low sodium diet  Plan:  RN discussed dietary adherence RN discussed medication adherence RN reiterated CHF symptoms of swelling not in legs or ankles only but hands or abdomen also Referred to social worker RN will follow up outreach within the month of December RN sent update assessment to PCP  [name] [phone]

## 2019-11-06 ENCOUNTER — Encounter: Payer: Self-pay | Admitting: *Deleted

## 2019-11-06 ENCOUNTER — Other Ambulatory Visit: Payer: Self-pay | Admitting: *Deleted

## 2019-11-06 NOTE — Patient Outreach (Signed)
[name] Care Management  11/06/2019  Leonard [name] 09-03-1950 [id number]   CSW was able to make initial contact with patient today to perform phone assessment, as well as assess and assist with social work needs and services.  CSW introduced self, explained role and types of services provided through [place] [facility] ([place] [facility]).  CSW further explained to patient that CSW works with patient's [place], also with [place] [facility], Johny Shock.  CSW then explained the reason for the call, indicating that Mrs. Pleasant thought that patient would benefit from social work services and resources to assist with making alternate housing arrangements, as patient does not feel safe in is current place of residence.  CSW obtained two HIPAA compliant identifiers from patient, which included patient's name and date of birth.  CSW provided patient with CSW's contact information, encouraging him to contact CSW directly if social work needs arise.  Patient admitted that he recently moved into a new apartment, signing a 6 months lease.  Patient reported that he no longer likes the apartment, or the location, requesting assistance with finding alternate housing arrangements.  Patient indicated that he does not feel safe in his current location and that there is poor lighting on the premises.  CSW agreed to mail patient a list of housing resources, which included all of the following:  Resources for Seniors; [employer]; [place] for Seniors; Housing Resources; Water quality scientist; Section 8 Housing Application.  CSW will follow-up with patient again next week, on Thursday, November 15, 2019, around 10:00am, to ensure that he received the resource information, answer any questions he may have pertaining to the information received, and assist with application completion and submission, if  necessary.  [name], BSW, MSW, LCSW  Licensed Education officer, environmental Health System  Mailing [place] [address], [place], [place] [address] Physical Address-[address] [place], [place] [address] Toll Free Main # [phone] Fax # [phone] Cell # [phone]  [email or web address]

## 2019-11-07 ENCOUNTER — Ambulatory Visit: Payer: Self-pay | Admitting: *Deleted

## 2019-11-08 DIAGNOSIS — Z720 Tobacco use: Secondary | ICD-10-CM | POA: Diagnosis not present

## 2019-11-08 DIAGNOSIS — I110 Hypertensive heart disease with heart failure: Secondary | ICD-10-CM | POA: Diagnosis not present

## 2019-11-08 DIAGNOSIS — J449 Chronic obstructive pulmonary disease, unspecified: Secondary | ICD-10-CM | POA: Diagnosis not present

## 2019-11-08 DIAGNOSIS — I5022 Chronic systolic (congestive) heart failure: Secondary | ICD-10-CM | POA: Diagnosis not present

## 2019-11-15 ENCOUNTER — Other Ambulatory Visit: Payer: Self-pay | Admitting: *Deleted

## 2019-11-15 ENCOUNTER — Encounter: Payer: Self-pay | Admitting: *Deleted

## 2019-11-15 NOTE — Patient Outreach (Signed)
[facility]) Care Management  11/15/2019  [name] [birth year] [id number]   CSW was able to make contact with patient today to follow-up regarding social work services and resources, as well as to ensure that patient received the packet of resource information mailed to his home by CSW last week.  Patient confirmed receipt, admitting that he may already have a few leads with regards to housing.  CSW inquired as to how CSW could be of further assistance to patient at this time; however, patient denied being able to identify any additional social work needs at present.  CSW was able to confirm that patient has the correct contact information for CSW, encouraging patient to contact CSW directly if additional social work needs arise in the near future.  Patient voiced understanding and was agreeable to this plan, expressing much appreciation for the resources provided to him by CSW.  CSW will perform a case closure on patient, as all goals of treatment have been met from social work standpoint and no additional social work needs have been identified at this time.  CSW will notify patient's [place], also with [place] Management, Johny Shock of CSW's plans to close patient's case.  CSW will fax an update to patient's Primary Care Physician, Dr. Lucianne Lei to ensure that she is aware of CSW's involvement with patient's plan of care, as well as route a Physician Case Closure Letter.    [name], BSW, MSW, LCSW  Licensed Education officer, environmental Health System  Mailing [place] [address], [place], [place] [address] Physical Address-[address] [place], [place] [address] Toll Free Main # [phone] Fax # [phone] Cell # [phone]  [email or web address]

## 2019-12-08 DIAGNOSIS — Z720 Tobacco use: Secondary | ICD-10-CM | POA: Diagnosis not present

## 2019-12-08 DIAGNOSIS — I5022 Chronic systolic (congestive) heart failure: Secondary | ICD-10-CM | POA: Diagnosis not present

## 2019-12-08 DIAGNOSIS — J449 Chronic obstructive pulmonary disease, unspecified: Secondary | ICD-10-CM | POA: Diagnosis not present

## 2019-12-08 DIAGNOSIS — I110 Hypertensive heart disease with heart failure: Secondary | ICD-10-CM | POA: Diagnosis not present

## 2019-12-11 ENCOUNTER — Other Ambulatory Visit: Payer: Self-pay | Admitting: *Deleted

## 2019-12-11 NOTE — Patient Outreach (Signed)
[facility] [facility]) Care Management  12/11/2019  Leonard [name] Oct 14, 1950 [id number]   RN Health  Coach received telephone call from patient.  Hipaa compliance verified. Patient is in [place] visiting family. Patient has gout and calluses on foot and need to go to a Dr. Patient wanted to know who he could go to in [place]. RN explained to patient that he can call the benefits dept on the back of the card and see who the Drs in [place] are that he can go to. Or he can call the Podiatrist  Dr offices there in [place] and see in they take his insurance.  Plan: Patient will contact benefits department Patient will contact the [facility] offices in [place] to see if they take his insurance  [place] Management [phone]

## 2019-12-12 DIAGNOSIS — M109 Gout, unspecified: Secondary | ICD-10-CM | POA: Diagnosis not present

## 2019-12-25 DIAGNOSIS — M50122 Cervical disc disorder at C5-C6 level with radiculopathy: Secondary | ICD-10-CM | POA: Diagnosis not present

## 2019-12-25 DIAGNOSIS — M9902 Segmental and somatic dysfunction of thoracic region: Secondary | ICD-10-CM | POA: Diagnosis not present

## 2019-12-25 DIAGNOSIS — M546 Pain in thoracic spine: Secondary | ICD-10-CM | POA: Diagnosis not present

## 2019-12-25 DIAGNOSIS — M9901 Segmental and somatic dysfunction of cervical region: Secondary | ICD-10-CM | POA: Diagnosis not present

## 2020-01-01 DIAGNOSIS — M9901 Segmental and somatic dysfunction of cervical region: Secondary | ICD-10-CM | POA: Diagnosis not present

## 2020-01-01 DIAGNOSIS — M50122 Cervical disc disorder at C5-C6 level with radiculopathy: Secondary | ICD-10-CM | POA: Diagnosis not present

## 2020-01-01 DIAGNOSIS — M546 Pain in thoracic spine: Secondary | ICD-10-CM | POA: Diagnosis not present

## 2020-01-01 DIAGNOSIS — M9902 Segmental and somatic dysfunction of thoracic region: Secondary | ICD-10-CM | POA: Diagnosis not present

## 2020-01-08 DIAGNOSIS — M9902 Segmental and somatic dysfunction of thoracic region: Secondary | ICD-10-CM | POA: Diagnosis not present

## 2020-01-08 DIAGNOSIS — M546 Pain in thoracic spine: Secondary | ICD-10-CM | POA: Diagnosis not present

## 2020-01-08 DIAGNOSIS — M50122 Cervical disc disorder at C5-C6 level with radiculopathy: Secondary | ICD-10-CM | POA: Diagnosis not present

## 2020-01-08 DIAGNOSIS — M9901 Segmental and somatic dysfunction of cervical region: Secondary | ICD-10-CM | POA: Diagnosis not present

## 2020-01-08 DIAGNOSIS — J449 Chronic obstructive pulmonary disease, unspecified: Secondary | ICD-10-CM | POA: Diagnosis not present

## 2020-01-08 DIAGNOSIS — I5022 Chronic systolic (congestive) heart failure: Secondary | ICD-10-CM | POA: Diagnosis not present

## 2020-01-08 DIAGNOSIS — Z720 Tobacco use: Secondary | ICD-10-CM | POA: Diagnosis not present

## 2020-01-08 DIAGNOSIS — I110 Hypertensive heart disease with heart failure: Secondary | ICD-10-CM | POA: Diagnosis not present

## 2020-01-15 DIAGNOSIS — M1A0720 Idiopathic chronic gout, left ankle and foot, without tophus (tophi): Secondary | ICD-10-CM | POA: Diagnosis not present

## 2020-01-21 ENCOUNTER — Other Ambulatory Visit: Payer: Self-pay | Admitting: *Deleted

## 2020-01-21 NOTE — Patient Outreach (Signed)
[place] [facility]) Care Management  01/21/2020  Leonard [name] 13-Dec-1950 [id number]   RN Health Coach received  telephone call from patient for coordination of care.  Hipaa compliance verified.  Plan: Referred to social worker  [place] Management [phone]

## 2020-01-22 DIAGNOSIS — M10072 Idiopathic gout, left ankle and foot: Secondary | ICD-10-CM | POA: Diagnosis not present

## 2020-01-23 ENCOUNTER — Other Ambulatory Visit: Payer: Self-pay | Admitting: *Deleted

## 2020-01-23 NOTE — Patient Outreach (Signed)
.  [place] [facility] Care Management  01/23/2020  Kayd Launer [birth year] [id number]   CSW made contact with pt and confirmed his identity.  CSW introduced self, role and reason for call; etoh use.   Pt admits to drinking daily; "about a fifth of lquor every 4-5 days". Pt states he has been drinking heavily for 4 years.  "I have been in [place] visiting my sister and she observed it and drew concern".  CSW acknowledged pt and his sister's awareness and applauded pt for his honesty and wanting to stop.  "I want more years of life... I just turned 69".   Pt has [facility] and also states full Medicaid; he had questions about Medicaid ending and was provided with local number to call to DSS to speak with his Adult Medicaid worker about re-newing/reapplying.    Pt reports he is retired, drives and would like to pursue an Inpatient etoh rehab after Christmas.  CSW offered to look into the options with his insurance.  CSW also encouraged pt to discuss with his PCP as well.   CSW will research options and follow up with pt Friday.   Pt appreciative of call and support.  Eduard Clos, MSW, [place] Worker  [place] [phone]

## 2020-01-25 ENCOUNTER — Other Ambulatory Visit: Payer: Self-pay | Admitting: *Deleted

## 2020-01-28 NOTE — Patient Outreach (Addendum)
[place] [facility]) Care Management  01/28/2020  Latasha Puskas 10-Jan-1951 [id number]   CSW spoke with pt in regards to his ETOH use and desire to seek in patient/residential rehab after the holidays. Pt is interested in seeking a local venue for support/counseling before and after his inpatient ventures.  CSW will assist pt with contacts/initial appointment visit and provide support and encouragement to him.   CSW will follow up with pt 01/31/2020.   Eduard Clos, MSW, [place] Worker  [place] [phone]

## 2020-01-29 ENCOUNTER — Ambulatory Visit
Admission: RE | Admit: 2020-01-29 | Discharge: 2020-01-29 | Disposition: A | Discharge status: Home / Self Care | Payer: Medicare Other | Source: Ambulatory Visit | Attending: Family Medicine | Admitting: Family Medicine

## 2020-01-29 ENCOUNTER — Other Ambulatory Visit: Payer: Self-pay | Admitting: *Deleted

## 2020-01-29 ENCOUNTER — Other Ambulatory Visit: Payer: Self-pay | Admitting: Family Medicine

## 2020-01-29 DIAGNOSIS — I5022 Chronic systolic (congestive) heart failure: Secondary | ICD-10-CM | POA: Diagnosis not present

## 2020-01-29 DIAGNOSIS — M79672 Pain in left foot: Secondary | ICD-10-CM

## 2020-01-29 DIAGNOSIS — I10 Essential (primary) hypertension: Secondary | ICD-10-CM | POA: Diagnosis not present

## 2020-01-29 DIAGNOSIS — R7309 Other abnormal glucose: Secondary | ICD-10-CM | POA: Diagnosis not present

## 2020-01-29 DIAGNOSIS — M7989 Other specified soft tissue disorders: Secondary | ICD-10-CM | POA: Diagnosis not present

## 2020-01-29 DIAGNOSIS — M1010 Lead-induced gout, unspecified site: Secondary | ICD-10-CM | POA: Diagnosis not present

## 2020-01-29 DIAGNOSIS — M1000 Idiopathic gout, unspecified site: Secondary | ICD-10-CM | POA: Diagnosis not present

## 2020-01-29 NOTE — Patient Instructions (Signed)
Goals Addressed            This Visit's Progress   . [facility] admissions for CHF exacerbation       Timeframe:  Long-Range Goal Priority:  High Start Date:   [id number]                          Expected End Date:     [id number]  Follow up date [id number]                CARE PLAN ENTRY (see longitudinal plan of care for additional care plan information)   Current Barriers:  Marland Kitchen Knowledge deficit related to basic heart failure pathophysiology and self care management . Financial strain  Case Manager Clinical Goal(s):  Marland Kitchen Over the next 90 days, patient will verbalize understanding of Heart Failure Action Plan and when to call doctor . Over the next 90 days, patient will take all Heart Failure mediations as prescribed . Over the next 90 days, patient will weigh daily and record (notifying MD of 3 lb weight gain over night or 5 lb in a week) . Patient will verbalize receiving annual flu vaccine within the next 90 days.   Interventions:  . Provided verbal education on low sodium diet . Provided written education on low sodium diet . Reviewed Heart Failure Action Plan in depth and provided written copy . Discussed importance of daily weight and advised patient to weigh and record daily . Reviewed role of diuretics in prevention of fluid overload and management of heart failure  Patient Self Care Activities:  . Takes Heart Failure Medications as prescribed . Weighs daily and record (notifying MD of 3 lb weight gain over night or 5 lb in a week) . Verbalizes understanding of and follows CHF Action Plan . Adheres to low sodium diet Patient needs encouragement to weigh and monitor blood pressure at home and not at the Dr office only    . (THN)Track and Manage Fluids and Swelling-Heart Failure       Timeframe:  Short-Term Goal Priority:  High Start Date:   [id number]                          Expected End Date:   [id number]                    Follow Up Date [id number]    - call office if I gain  more than 2 pounds in one day or 5 pounds in one week - keep legs up while sitting - track weight in diary - use salt in moderation - watch for swelling in feet, ankles and legs every day - weigh myself daily    Why is this important?    It is important to check your weight daily and watch how much salt and liquids you have.   It will help you to manage your heart failure.    Notes:  Patient needs to weigh self daily    . (THN)Track and Manage Symptoms-Heart Failure       Timeframe:  Short-Term Goal Priority:  High Start Date:     [id number]                        Expected End Date:    [id number]  Follow Up Date [id number]   - develop a rescue plan - eat more whole grains, fruits and vegetables, lean meats and healthy fats - follow rescue plan if symptoms flare-up - know when to call the doctor - track symptoms and what helps feel better or worse - dress right for the weather, hot or cold    Why is this important?    You will be able to handle your symptoms better if you keep track of them.   Making some simple changes to your lifestyle will help.   Eating healthy is one thing you can do to take good care of yourself.    Notes:

## 2020-01-29 NOTE — Patient Outreach (Addendum)
Leonard [name]) Care Management  [place]  01/29/2020   Leonard [name] 19-Sep-1950 [id number]  RN Health Coach telephone call to patient.  Hipaa compliance verified. Per patient he is on way to [facility] from [place] for PCP appointment. Patient is having swelling in foot from gout. Per patient he has been watching the sodium in his diet. He has not been checking his blood pressure as often at home.  It is mostly checked at the Dr office. He is monitoring his weight for increase randomly not daily. Patient has received his flu shot and COVID booster vaccine. Patient has been referred to Education officer, museum for services.  Encounter Medications:  Outpatient Encounter Medications as of 01/29/2020  Medication Sig  . albuterol (PROVENTIL HFA;VENTOLIN HFA) 108 (90 Base) MCG/ACT inhaler Inhale 2 puffs into the lungs every 4 (four) hours as needed for wheezing or shortness of breath.  Marland Kitchen albuterol (PROVENTIL) (2.5 MG/3ML) 0.083% nebulizer solution Take 3 mLs (2.5 mg total) by nebulization every 6 (six) hours as needed for wheezing or shortness of breath.  Marland Kitchen amLODipine (NORVASC) 5 MG tablet Take 1 tablet by mouth daily  . carvedilol (COREG) 25 MG tablet Take 1 tablet (25 mg total) by mouth 2 (two) times daily.  . colchicine 0.6 MG tablet Take 1 tablet (0.6 mg total) by mouth daily.  . dorzolamide-timolol (COSOPT) 22.3-6.8 MG/ML ophthalmic solution Place 1 drop into both eyes 2 (two) times daily.  Marland Kitchen ENTRESTO 97-103 MG Take 1 tablet by mouth 2 (two) times daily.  . furosemide (LASIX) 40 MG tablet Take 1 tablet (40 mg total) by mouth daily. You may take an extra tablet in the PM AS NEEDED for swelling up to twice a week  . latanoprost (XALATAN) 0.005 % ophthalmic solution Place 1 drop into both eyes at bedtime.   . SYMBICORT 160-4.5 MCG/ACT inhaler Inhale 1 puff into the lungs daily.   No facility-administered encounter medications on file as of 01/29/2020.    Functional Status:   In your present state of health, do you have any difficulty performing the following activities: 11/06/2019  Hearing? N  Vision? N  Difficulty concentrating or making decisions? N  Walking or climbing stairs? N  Dressing or bathing? N  Doing errands, shopping? N  Preparing Food and eating ? N  Using the Toilet? N  In the past six months, have you accidently leaked urine? N  Do you have problems with loss of bowel control? N  Managing your Medications? N  Managing your Finances? N  Housekeeping or managing your Housekeeping? N  Some recent data might be hidden    Fall/Depression Screening: Fall Risk  01/29/2020 11/06/2019 11/06/2019  Falls in the past year? 0 0 0  Number falls in past yr: 0 0 0  Injury with Fall? 0 0 0  Risk for fall due to : - No Fall Risks No Fall Risks  Follow up Falls evaluation completed Education provided;Falls prevention discussed Education provided;Falls prevention discussed   PHQ 2/9 Scores 11/06/2019 11/06/2019 12/29/2018 08/28/2018 05/01/2018 03/03/2018 12/29/2017  PHQ - 2 Score 0 0 0 0 0 0 1    Assessment:  Goals Addressed            This Visit's Progress   . (THN)no admissions for CHF exacerbation       Timeframe:  Long-Range Goal Priority:  High Start Date:   [id number]  Expected End Date:     [id number]  Follow up date [id number]                CARE PLAN ENTRY (see longitudinal plan of care for additional care plan information)   Current Barriers:  Marland Kitchen Knowledge deficit related to basic heart failure pathophysiology and self care management . Financial strain  Case Manager Clinical Goal(s):  Marland Kitchen Over the next 90 days, patient will verbalize understanding of Heart Failure Action Plan and when to call doctor . Over the next 90 days, patient will take all Heart Failure mediations as prescribed . Over the next 90 days, patient will weigh daily and record (notifying MD of 3 lb weight gain over night or 5 lb in a week) . Patient  will verbalize receiving annual flu vaccine within the next 90 days.   Interventions:  . Provided verbal education on low sodium diet . Provided written education on low sodium diet . Reviewed Heart Failure Action Plan in depth and provided written copy . Discussed importance of daily weight and advised patient to weigh and record daily . Reviewed role of diuretics in prevention of fluid overload and management of heart failure  Patient Self Care Activities:  . Takes Heart Failure Medications as prescribed . Weighs daily and record (notifying MD of 3 lb weight gain over night or 5 lb in a week) . Verbalizes understanding of and follows CHF Action Plan . Adheres to low sodium diet Patient needs encouragement to weigh and monitor blood pressure at home and not at the Dr office only    . (THN)Track and Manage Fluids and Swelling-Heart Failure       Timeframe:  Short-Term Goal Priority:  High Start Date:   [id number]                          Expected End Date:   [id number]                    Follow Up Date [id number]    - call office if I gain more than 2 pounds in one day or 5 pounds in one week - keep legs up while sitting - track weight in diary - use salt in moderation - watch for swelling in feet, ankles and legs every day - weigh myself daily    Why is this important?    It is important to check your weight daily and watch how much salt and liquids you have.   It will help you to manage your heart failure.    Notes:  Patient needs to weigh self daily    . (THN)Track and Manage Symptoms-Heart Failure       Timeframe:  Short-Term Goal Priority:  High Start Date:     [id number]                        Expected End Date:    [id number]                   Follow Up Date [id number]   - develop a rescue plan - eat more whole grains, fruits and vegetables, lean meats and healthy fats - follow rescue plan if symptoms flare-up - know when to call the doctor - track symptoms and what  helps feel better or worse - dress right for the weather, hot or cold    Why is this important?  You will be able to handle your symptoms better if you keep track of them.   Making some simple changes to your lifestyle will help.   Eating healthy is one thing you can do to take good care of yourself.    Notes:        Plan:  Follow-up:  Patient agrees to Care Plan and Follow-up. Patient will follow up PCP regarding gout Patient will eat more fresh and frozen vegetables Patient will continue to monitor sodium intake Patient will monitor Wt and BP more at home RN will follow up outreach within the month of March RN sent update assessment to PCP  [place] Management [phone]

## 2020-01-31 ENCOUNTER — Other Ambulatory Visit: Payer: Self-pay | Admitting: *Deleted

## 2020-01-31 NOTE — Patient Outreach (Signed)
[place] [facility]) Care Management  01/31/2020  Antoni Stefan 1951/01/13 [id number]   CSW spoke with pt who reports he has returned to [facility] and is visiting his sister over the holidays. Pt complains of foot hurting due to gout; "I can't drink when I'm on this medicine for gout".  Pt reports he has had to have "one beer here or a sip" due to some withdrawal signs.  CSW discussed withdrawal signs and advised pt to seek medical care if needed.  He plans to continue "sipping" until he gets back home and will contact the programs discussed with him; including the [place], [facility] as well as the [place] with [facility].   CSW offered support and encouragement and reassured him we are here to help him. Pt also has good support from his sister whom he is visiting.    CSW to touch base the first week of January for updates.  CSW provided pt with contact #'s for outreach and support.  Eduard Clos, MSW, [place] Worker  [place] [phone]

## 2020-02-08 DIAGNOSIS — J449 Chronic obstructive pulmonary disease, unspecified: Secondary | ICD-10-CM | POA: Diagnosis not present

## 2020-02-08 DIAGNOSIS — I5022 Chronic systolic (congestive) heart failure: Secondary | ICD-10-CM | POA: Diagnosis not present

## 2020-02-08 DIAGNOSIS — Z720 Tobacco use: Secondary | ICD-10-CM | POA: Diagnosis not present

## 2020-02-08 DIAGNOSIS — I110 Hypertensive heart disease with heart failure: Secondary | ICD-10-CM | POA: Diagnosis not present

## 2020-02-11 ENCOUNTER — Other Ambulatory Visit: Payer: Self-pay | Admitting: *Deleted

## 2020-02-11 NOTE — Patient Outreach (Signed)
[facility]) Care Management  02/11/2020  Leonard [name] 1950-12-02 [id number]   CSW spoke with pt today who reports he is still in [place] with his sister.  Pt is wanting to pursue an inpatient alcohol rehab in the [place] area.  CSW provided pt with a # to call to seek guidance from his insurance company's "Clinical Care Advocate" program/assistance.   Pt in good spirits; states he wants to "do this right so I can truly get it out of my system".  CSW also suggested to pt to consider outpatient support while awaiting placement as well as afterwards.    CSW offered pt support and reminded him to call with any concerns or needs that arise before follow up call next Monday.   Reece Levy, MSW, LCSW Clinical Social Worker  [facility] [employer] [phone]

## 2020-02-18 ENCOUNTER — Other Ambulatory Visit: Payer: Self-pay | Admitting: *Deleted
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
## 2020-02-21 DIAGNOSIS — M1000 Idiopathic gout, unspecified site: Secondary | ICD-10-CM | POA: Diagnosis not present

## 2020-02-21 DIAGNOSIS — I10 Essential (primary) hypertension: Secondary | ICD-10-CM | POA: Diagnosis not present

## 2020-02-28 ENCOUNTER — Other Ambulatory Visit: Payer: Self-pay | Admitting: *Deleted

## 2020-02-28 NOTE — Patient Outreach (Signed)
[place] [facility]) Care Management  02/28/2020  [name] Apr 19, 1950 [id number]   CSW spoke with Leonard [name] who is still in [place]. Leonard [name] wants to pursue detox at [facility] in [place].  "I know of them and heard good things".  Leonard [name] has not called the # provided to call Insurance rep to further inquire- he is reminded and encouraged to do so.  CSW also provided Leonard [name] with # to [facility] and he may call them to inquire.   CSW stressed to Leonard [name] that he has to take these steps to get the help and answers needed in regards to his wishes for residential detox/rehab.   Leonard [name] appreciative and states he will do this.  CSW will call Leonard [name] again next Friday for updates on this plan.   Eduard Clos, MSW, [place] Worker  [place] [phone]

## 2020-03-07 ENCOUNTER — Ambulatory Visit: Payer: Self-pay | Admitting: *Deleted

## 2020-03-08 DIAGNOSIS — Z720 Tobacco use: Secondary | ICD-10-CM | POA: Diagnosis not present

## 2020-03-08 DIAGNOSIS — I5022 Chronic systolic (congestive) heart failure: Secondary | ICD-10-CM | POA: Diagnosis not present

## 2020-03-08 DIAGNOSIS — I110 Hypertensive heart disease with heart failure: Secondary | ICD-10-CM | POA: Diagnosis not present

## 2020-03-08 DIAGNOSIS — J449 Chronic obstructive pulmonary disease, unspecified: Secondary | ICD-10-CM | POA: Diagnosis not present

## 2020-03-17 ENCOUNTER — Other Ambulatory Visit: Payer: Self-pay | Admitting: *Deleted

## 2020-03-17 DIAGNOSIS — Z591 Inadequate housing: Secondary | ICD-10-CM
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
## 2020-03-21 ENCOUNTER — Telehealth: Payer: Self-pay | Admitting: Family Medicine

## 2020-03-21 NOTE — Telephone Encounter (Signed)
° °  Telephone encounter was:  Successful.  03/21/2020 Name: Leonard [name] MRN: [id number] DOB: February 13, 1950  Leonard [name] is a 70 y.o. year old male who is a primary care patient of Lucianne Lei, MD . The community resource team was consulted for assistance with Housing  Care guide performed the following interventions: Discussed resources to assist with housing. Patient stated that he has already received a list of affordable senior housing within the [facility] and that he has been calling around to see if any of the properties are availaible. Mr. Keimig stated that so far every place he has called has a waitlist, but he has a few properties that will be giving him a call back.  Informed patient that he can also give [employer] a call at [phone] and they can also give him a list of available properties within the area. Patient stated that he will give them a call and that he had no additonal needs at this time. .  Follow Up Plan:  No further follow up planned at this time. The patient has been provided with needed resources. Referral will be closed.  [place], Care Management Phone: [phone] Email: [email or web address]

## 2020-04-07 DIAGNOSIS — J449 Chronic obstructive pulmonary disease, unspecified: Secondary | ICD-10-CM | POA: Diagnosis not present

## 2020-04-07 DIAGNOSIS — Z720 Tobacco use: Secondary | ICD-10-CM | POA: Diagnosis not present

## 2020-04-07 DIAGNOSIS — I5022 Chronic systolic (congestive) heart failure: Secondary | ICD-10-CM | POA: Diagnosis not present

## 2020-04-07 DIAGNOSIS — I110 Hypertensive heart disease with heart failure: Secondary | ICD-10-CM | POA: Diagnosis not present

## 2020-04-28 ENCOUNTER — Other Ambulatory Visit: Payer: Self-pay | Admitting: *Deleted

## 2020-04-28 NOTE — Patient Outreach (Signed)
[place] [facility]) Care Management  04/28/2020  Leonard [name] 05/29/1950 [id number]   RN Health Coach telephone call to patient.  Hipaa compliance verified. Per patient he is driving in the process of moving.  Plan: RN will call patient again within 30 days.   [place] Care Management [phone]

## 2020-05-08 DIAGNOSIS — I5022 Chronic systolic (congestive) heart failure: Secondary | ICD-10-CM | POA: Diagnosis not present

## 2020-05-08 DIAGNOSIS — I110 Hypertensive heart disease with heart failure: Secondary | ICD-10-CM | POA: Diagnosis not present

## 2020-05-08 DIAGNOSIS — J449 Chronic obstructive pulmonary disease, unspecified: Secondary | ICD-10-CM | POA: Diagnosis not present

## 2020-06-04 ENCOUNTER — Other Ambulatory Visit: Payer: Self-pay | Admitting: *Deleted

## 2020-06-04 NOTE — Patient Outreach (Signed)
[place] [facility]) Care Management  06/04/2020  Leonard [name] 1950/12/07 [id number]   .RN Health Coach telephone call to patient.  Hipaa compliance verified. Per patient he is driving niece to work and have an eye Dr appointment after that. RN and patient agreed to call next week.  Plan: RN will call 06/12/2020 [place] Management [phone]

## 2020-06-07 DIAGNOSIS — J449 Chronic obstructive pulmonary disease, unspecified: Secondary | ICD-10-CM | POA: Diagnosis not present

## 2020-06-07 DIAGNOSIS — I110 Hypertensive heart disease with heart failure: Secondary | ICD-10-CM | POA: Diagnosis not present

## 2020-06-07 DIAGNOSIS — Z720 Tobacco use: Secondary | ICD-10-CM | POA: Diagnosis not present

## 2020-06-07 DIAGNOSIS — I5022 Chronic systolic (congestive) heart failure: Secondary | ICD-10-CM | POA: Diagnosis not present

## 2020-06-12 ENCOUNTER — Other Ambulatory Visit: Payer: Self-pay | Admitting: *Deleted

## 2020-06-12 NOTE — Patient Outreach (Signed)
[place] [facility]) Care Management  06/12/2020  Leonard [name] 11-27-1950 [id number]   RN Health Coach attempted follow up outreach call to patient.  Patient was unavailable. HIPPA compliance voicemail message left with return callback number.  Plan: RN will call patient again within 30 days.  [place] Care Management [phone]

## 2020-07-08 DIAGNOSIS — J449 Chronic obstructive pulmonary disease, unspecified: Secondary | ICD-10-CM | POA: Diagnosis not present

## 2020-07-08 DIAGNOSIS — Z720 Tobacco use: Secondary | ICD-10-CM | POA: Diagnosis not present

## 2020-07-08 DIAGNOSIS — I110 Hypertensive heart disease with heart failure: Secondary | ICD-10-CM | POA: Diagnosis not present

## 2020-07-08 DIAGNOSIS — I5022 Chronic systolic (congestive) heart failure: Secondary | ICD-10-CM | POA: Diagnosis not present

## 2020-07-14 DIAGNOSIS — M5136 Other intervertebral disc degeneration, lumbar region: Secondary | ICD-10-CM | POA: Diagnosis not present

## 2020-07-14 DIAGNOSIS — M9904 Segmental and somatic dysfunction of sacral region: Secondary | ICD-10-CM | POA: Diagnosis not present

## 2020-07-14 DIAGNOSIS — M9905 Segmental and somatic dysfunction of pelvic region: Secondary | ICD-10-CM | POA: Diagnosis not present

## 2020-07-14 DIAGNOSIS — M9903 Segmental and somatic dysfunction of lumbar region: Secondary | ICD-10-CM | POA: Diagnosis not present

## 2020-07-17 ENCOUNTER — Other Ambulatory Visit: Payer: Self-pay | Admitting: *Deleted

## 2020-07-17 DIAGNOSIS — M9905 Segmental and somatic dysfunction of pelvic region: Secondary | ICD-10-CM | POA: Diagnosis not present

## 2020-07-17 DIAGNOSIS — M9904 Segmental and somatic dysfunction of sacral region: Secondary | ICD-10-CM | POA: Diagnosis not present

## 2020-07-17 DIAGNOSIS — M5136 Other intervertebral disc degeneration, lumbar region: Secondary | ICD-10-CM | POA: Diagnosis not present

## 2020-07-17 DIAGNOSIS — M9903 Segmental and somatic dysfunction of lumbar region: Secondary | ICD-10-CM | POA: Diagnosis not present

## 2020-07-17 NOTE — Patient Instructions (Signed)
Goals Addressed             This Visit's Progress    [facility]) No admissions for CHF exacerbation   On track    Timeframe:  Long-Range Goal Priority:  High Start Date:   [id number]                          Expected End Date:     [id number]  Follow up date [id number]                CARE PLAN ENTRY (see longitudinal plan of care for additional care plan information)   Current Barriers:  Knowledge deficit related to basic heart failure pathophysiology and self care management Financial strain  Case Manager Clinical Goal(s):  Over the next 90 days, patient will verbalize understanding of Heart Failure Action Plan and when to call doctor Over the next 90 days, patient will take all Heart Failure mediations as prescribed Over the next 90 days, patient will weigh daily and record (notifying MD of 3 lb weight gain over night or 5 lb in a week) Patient will verbalize receiving annual flu vaccine within the next 90 days.   Interventions:  Provided verbal education on low sodium diet Provided written education on low sodium diet Reviewed Heart Failure Action Plan in depth and provided written copy Discussed importance of daily weight and advised patient to weigh and record daily Reviewed role of diuretics in prevention of fluid overload and management of heart failure  Patient Self Care Activities:  Takes Heart Failure Medications as prescribed Weighs daily and record (notifying MD of 3 lb weight gain over night or 5 lb in a week) Verbalizes understanding of and follows CHF Action Plan Adheres to low sodium diet Patient needs encouragement to weigh and monitor blood pressure at home and not at the Dr office only NOTES: [id number] Pt has not had any recent admissions for CHF exacerbation      (THN)Track and Manage Fluids and Swelling-Heart Failure   On track    Timeframe:  Short-Term Goal Priority:  High Start Date:   [id number]                          Expected End Date:   [id number]                  Follow Up Date [id number]    - call office if I gain more than 2 pounds in one day or 5 pounds in one week - keep legs up while sitting - track weight in diary - use salt in moderation - watch for swelling in feet, ankles and legs every day - weigh myself daily    Why is this important?   It is important to check your weight daily and watch how much salt and liquids you have.  It will help you to manage your heart failure.    Notes:  Patient needs to weigh self daily      (THN)Track and Manage Symptoms-Heart Failure   On track    Timeframe:  Short-Term Goal Priority:  High Start Date:     12458099                        Expected End Date:    83382505                   Follow  Up Date [id number]   - develop a rescue plan - eat more whole grains, fruits and vegetables, lean meats and healthy fats - follow rescue plan if symptoms flare-up - know when to call the doctor - track symptoms and what helps feel better or worse - dress right for the weather, hot or cold    Why is this important?   You will be able to handle your symptoms better if you keep track of them.  Making some simple changes to your lifestyle will help.  Eating healthy is one thing you can do to take good care of yourself.    Notes:  [id number] Patient understands to monitor for swelling, coughing

## 2020-07-17 NOTE — Patient Outreach (Addendum)
[facility]) Care Management  [place]  07/17/2020   Leonard [name] [birth year] [id number]  RN Health Coach telephone call to patient.  Hipaa compliance verified. Per patient he has moved to a new apartment in [place]. He does not have any swelling in lower extremities. He is walking  1-2 times a day around his new apartment complex. His appetite is better. Patient weighs randomly. Per patient he needs some help with ETOH abuse. He drinks 1/2 gallon within 4 days. Patient has agreed to follow up outreach calls.    Encounter Medications:  Outpatient Encounter Medications as of 07/17/2020  Medication Sig   albuterol (PROVENTIL HFA;VENTOLIN HFA) 108 (90 Base) MCG/ACT inhaler Inhale 2 puffs into the lungs every 4 (four) hours as needed for wheezing or shortness of breath.   albuterol (PROVENTIL) (2.5 MG/3ML) 0.083% nebulizer solution Take 3 mLs (2.5 mg total) by nebulization every 6 (six) hours as needed for wheezing or shortness of breath.   amLODipine (NORVASC) 5 MG tablet Take 1 tablet by mouth daily   carvedilol (COREG) 25 MG tablet Take 1 tablet (25 mg total) by mouth 2 (two) times daily.   colchicine 0.6 MG tablet Take 1 tablet (0.6 mg total) by mouth daily.   dorzolamide-timolol (COSOPT) 22.3-6.8 MG/ML ophthalmic solution Place 1 drop into both eyes 2 (two) times daily.   ENTRESTO 97-103 MG Take 1 tablet by mouth 2 (two) times daily.   furosemide (LASIX) 40 MG tablet Take 1 tablet (40 mg total) by mouth daily. You may take an extra tablet in the PM AS NEEDED for swelling up to twice a week   latanoprost (XALATAN) 0.005 % ophthalmic solution Place 1 drop into both eyes at bedtime.    SYMBICORT 160-4.5 MCG/ACT inhaler Inhale 1 puff into the lungs daily.   No facility-administered encounter medications on file as of 07/17/2020.    Functional Status:  In your present state of health, do you have any difficulty performing the following activities: 11/06/2019   Hearing? N  Vision? N  Difficulty concentrating or making decisions? N  Walking or climbing stairs? N  Dressing or bathing? N  Doing errands, shopping? N  Preparing Food and eating ? N  Using the Toilet? N  In the past six months, have you accidently leaked urine? N  Do you have problems with loss of bowel control? N  Managing your Medications? N  Managing your Finances? N  Housekeeping or managing your Housekeeping? N  Some recent data might be hidden    Fall/Depression Screening: Fall Risk  07/17/2020 01/29/2020 11/06/2019  Falls in the past year? 0 0 0  Number falls in past yr: 0 0 0  Injury with Fall? 0 0 0  Risk for fall due to : - - No Fall Risks  Follow up - Falls evaluation completed Education provided;Falls prevention discussed   PHQ 2/9 Scores 11/06/2019 11/06/2019 12/29/2018 08/28/2018 05/01/2018 03/03/2018 12/29/2017  PHQ - 2 Score 0 0 0 0 0 0 1    Assessment:   Care Plan Care Plan : Heart Failure (Adult)  Updates made by Verlin Grills, RN since 07/17/2020 12:00 AM     Problem: Symptom Exacerbation (Heart Failure)   Priority: High  Onset Date: 01/29/2020     Long-Range Goal: Symptom Exacerbation Prevented or Minimized   Start Date: 01/29/2020  Expected End Date: 02/06/2021  This Visit's Progress: On track  Priority: High  Note:   Evidence-based guidance:  Perform or review cognitive and/or  health literacy screening.  Assess understanding of adherence and barriers to treatment plan, as well as lifestyle changes; develop strategies to address barriers.  Establish a mutually-agreed-upon early intervention process to communicate with primary care provider when signs/symptoms worsen.  Facilitate timely posthospital discharge or emergency department treatment that includes intensive follow-up via telephone calls, home visit, telehealth monitoring and care at multidisciplinary heart failure clinic.  Adjust frequency and intensity of follow-up based on  presentation, number of emergency department visits, hospital admissions and frequency and severity of symptom exacerbation.  Facilitate timely visit, usually within 1 week, with primary care provider following hospital discharge.  Collaborate with clinical pharmacist to address adverse drug reactions, drug interactions, subtherapeutic dosage, patient and family education.  Regularly screen for presence of depressive symptoms using a validated tool; consider pharmacologic therapy and/or referral for cognitive behavioral therapy when present.  Refer to community-based services, such as a heart failure support group, community Economist or peer support program.  Review immunization status; arrange receipt of needed vaccinations.  Prepare patient for home oxygen use based on signs/symptoms.   Notes:     Task: Identify and Minimize Risk of Heart Failure Exacerbation   Due Date: 02/06/2021  Note:   Care Management Activities:    - barriers to lifestyle changes reviewed and addressed - healthy lifestyle promoted - rescue (action) plan developed - self-awareness of signs/symptoms of worsening disease encouraged    Notes:     Problem: Disease Progression (Heart Failure)   Priority: Medium  Onset Date: 01/29/2020     Long-Range Goal: Health Optimized   Start Date: 01/29/2020  Expected End Date: 02/06/2021  This Visit's Progress: Not on track  Priority: High  Note:   Evidence-based guidance:  Use brief intervention, such as 5 A's (Ask, Advise, Assess, Assist, Arrange) to encourage smoking cessation; refer to smoking cessation program, if ready for more intensive intervention.  Perform or refer to a registered dietitian for a nutrition assessment and nutrition-focused physical exam.   Identify potential micronutrient deficiencies, such as iron, vitamin D and thiamin.  Assess need for potential diet and fluid modification, such as reduced sodium or fluid intake.  Minimize unnecessary  dietary restrictions to increase oral intake. Note: Sodium restriction should be individualized to the patient and clinical status.  Facilitate home monitoring of weight.   Notes:  [id number] Patient needs treatment of Etoh abuse    Task: Optimize Health   Due Date: 02/06/2021  Note:   Care Management Activities:    - barriers to sufficient nutrient intake addressed - home monitoring of blood pressure encouraged - home monitoring of weight gain or loss encouraged - individualized medical nutrition therapy provided - weight gain or loss reviewed and trended    Notes:     Problem: Activity Tolerance (Heart Failure)   Priority: Medium     Goal: Activity Tolerance Optimized   Start Date: 01/29/2020  Expected End Date: 02/06/2021  This Visit's Progress: On track  Priority: Medium  Note:   Evidence-based guidance:  Promote daily physical activity that improves functional ability, cognition and quality of life.  Encourage reduction in sedentary time.   Encourage optimal, safe functional mobility and self-care performance based on ability and tolerance.   Promote breathing and energy conservation techniques, such as pursed-lip breathing, preplanning and pacing of activity, balancing activity and rest.  Encourage participation in cardiac rehabilitation services.   Notes:     Task: Maintain Strength and Functional Ability   Due Date: 02/06/2021  Note:  Care Management Activities:    - barriers to activities addressed - maximum independence promoted - self-care encouraged    Notes:       Goals Addressed             This Visit's Progress    [facility]) No admissions for CHF exacerbation   On track    Timeframe:  Long-Range Goal Priority:  High Start Date:   [id number]                          Expected End Date:     [id number]  Follow up date [id number]                CARE PLAN ENTRY (see longitudinal plan of care for additional care plan information)   Current Barriers:   Knowledge deficit related to basic heart failure pathophysiology and self care management Financial strain  Case Manager Clinical Goal(s):  Over the next 90 days, patient will verbalize understanding of Heart Failure Action Plan and when to call doctor Over the next 90 days, patient will take all Heart Failure mediations as prescribed Over the next 90 days, patient will weigh daily and record (notifying MD of 3 lb weight gain over night or 5 lb in a week) Patient will verbalize receiving annual flu vaccine within the next 90 days.   Interventions:  Provided verbal education on low sodium diet Provided written education on low sodium diet Reviewed Heart Failure Action Plan in depth and provided written copy Discussed importance of daily weight and advised patient to weigh and record daily Reviewed role of diuretics in prevention of fluid overload and management of heart failure  Patient Self Care Activities:  Takes Heart Failure Medications as prescribed Weighs daily and record (notifying MD of 3 lb weight gain over night or 5 lb in a week) Verbalizes understanding of and follows CHF Action Plan Adheres to low sodium diet Patient needs encouragement to weigh and monitor blood pressure at home and not at the Dr office only NOTES: [id number] Pt has not had any recent admissions for CHF exacerbation      (THN)Track and Manage Fluids and Swelling-Heart Failure   On track    Timeframe:  Short-Term Goal Priority:  High Start Date:   [id number]                          Expected End Date:   [id number]                 Follow Up Date [id number]    - call office if I gain more than 2 pounds in one day or 5 pounds in one week - keep legs up while sitting - track weight in diary - use salt in moderation - watch for swelling in feet, ankles and legs every day - weigh myself daily    Why is this important?   It is important to check your weight daily and watch how much salt and liquids you have.   It will help you to manage your heart failure.    Notes:  Patient needs to weigh self daily      (THN)Track and Manage Symptoms-Heart Failure   On track    Timeframe:  Short-Term Goal Priority:  High Start Date:     [id number]  Expected End Date:    [id number]                   Follow Up Date [id number]   - develop a rescue plan - eat more whole grains, fruits and vegetables, lean meats and healthy fats - follow rescue plan if symptoms flare-up - know when to call the doctor - track symptoms and what helps feel better or worse - dress right for the weather, hot or cold    Why is this important?   You will be able to handle your symptoms better if you keep track of them.  Making some simple changes to your lifestyle will help.  Eating healthy is one thing you can do to take good care of yourself.    Notes:  [id number] Patient understands to monitor for swelling, coughing         Plan:  Follow-up: Follow-up in 6 day(s) Patient will follow up with calling outpatient etoh center RN sent update assessment to PCP RN ordered free COVID tests   [place] Management [phone]

## 2020-07-21 DIAGNOSIS — M9904 Segmental and somatic dysfunction of sacral region: Secondary | ICD-10-CM | POA: Diagnosis not present

## 2020-07-21 DIAGNOSIS — M9903 Segmental and somatic dysfunction of lumbar region: Secondary | ICD-10-CM | POA: Diagnosis not present

## 2020-07-21 DIAGNOSIS — M9905 Segmental and somatic dysfunction of pelvic region: Secondary | ICD-10-CM | POA: Diagnosis not present

## 2020-07-21 DIAGNOSIS — M5136 Other intervertebral disc degeneration, lumbar region: Secondary | ICD-10-CM | POA: Diagnosis not present

## 2020-07-23 ENCOUNTER — Other Ambulatory Visit: Payer: Self-pay | Admitting: *Deleted

## 2020-07-23 NOTE — Patient Outreach (Signed)
[place] [facility]) Care Management  07/23/2020  Leonard [name] 1950/10/14 [id number]   RN Health coach attempted outreach call. Per voicemail the mail box is full.   Plan: RN will call again within 30 days   [place] Management [phone]

## 2020-08-04 ENCOUNTER — Other Ambulatory Visit: Payer: Self-pay | Admitting: *Deleted

## 2020-08-04 NOTE — Patient Outreach (Signed)
[place] [facility]) Care Management  08/04/2020  Leonard [name] [birth year] [id number]   RN Health Coach attempted follow up outreach call to patient.  Patient was unavailable. HIPPA compliance voicemail message left with return callback number.  Plan: RN will call patient again within 30 days.  [place] Care Management [phone]

## 2020-08-07 DIAGNOSIS — I5022 Chronic systolic (congestive) heart failure: Secondary | ICD-10-CM | POA: Diagnosis not present

## 2020-08-07 DIAGNOSIS — Z720 Tobacco use: Secondary | ICD-10-CM | POA: Diagnosis not present

## 2020-08-07 DIAGNOSIS — J449 Chronic obstructive pulmonary disease, unspecified: Secondary | ICD-10-CM | POA: Diagnosis not present

## 2020-08-07 DIAGNOSIS — I110 Hypertensive heart disease with heart failure: Secondary | ICD-10-CM | POA: Diagnosis not present

## 2020-08-21 DIAGNOSIS — I10 Essential (primary) hypertension: Secondary | ICD-10-CM | POA: Diagnosis not present

## 2020-09-03 ENCOUNTER — Other Ambulatory Visit: Payer: Self-pay | Admitting: *Deleted

## 2020-09-03 NOTE — Patient Outreach (Signed)
[place] [facility]) Care Management  [place]  09/03/2020   Leonard [name] 1950/10/13 [id number]  RN Health Coach telephone call to patient.  Hipaa compliance verified. Per patient he is doing good. He has been monitoring his blood pressure, but is not monitoring his weight daily.  He is monitoring  for swelling. He is taking his medications as per ordered. Per patient he does have an ETOH problem. RN discussed blood pressure medications and ETOH affects. He stated he has cut back some and decreased his cigarette smoking. He acknowledges that he needs help.Per patient he now has better living arrangements. He is socializing more. He is walking daily. Patient has agreed to further outreach calls.  Encounter Medications:  Outpatient Encounter Medications as of 09/03/2020  Medication Sig   albuterol (PROVENTIL HFA;VENTOLIN HFA) 108 (90 Base) MCG/ACT inhaler Inhale 2 puffs into the lungs every 4 (four) hours as needed for wheezing or shortness of breath.   albuterol (PROVENTIL) (2.5 MG/3ML) 0.083% nebulizer solution Take 3 mLs (2.5 mg total) by nebulization every 6 (six) hours as needed for wheezing or shortness of breath.   amLODipine (NORVASC) 5 MG tablet Take 1 tablet by mouth daily   carvedilol (COREG) 25 MG tablet Take 1 tablet (25 mg total) by mouth 2 (two) times daily.   colchicine 0.6 MG tablet Take 1 tablet (0.6 mg total) by mouth daily.   dorzolamide-timolol (COSOPT) 22.3-6.8 MG/ML ophthalmic solution Place 1 drop into both eyes 2 (two) times daily.   ENTRESTO 97-103 MG Take 1 tablet by mouth 2 (two) times daily.   furosemide (LASIX) 40 MG tablet Take 1 tablet (40 mg total) by mouth daily. You may take an extra tablet in the PM AS NEEDED for swelling up to twice a week   latanoprost (XALATAN) 0.005 % ophthalmic solution Place 1 drop into both eyes at bedtime.    SYMBICORT 160-4.5 MCG/ACT inhaler Inhale 1 puff into the lungs daily.   No facility-administered  encounter medications on file as of 09/03/2020.    Functional Status:  In your present state of health, do you have any difficulty performing the following activities: 11/06/2019  Hearing? N  Vision? N  Difficulty concentrating or making decisions? N  Walking or climbing stairs? N  Dressing or bathing? N  Doing errands, shopping? N  Preparing Food and eating ? N  Using the Toilet? N  In the past six months, have you accidently leaked urine? N  Do you have problems with loss of bowel control? N  Managing your Medications? N  Managing your Finances? N  Housekeeping or managing your Housekeeping? N  Some recent data might be hidden    Fall/Depression Screening: Fall Risk  07/17/2020 01/29/2020 11/06/2019  Falls in the past year? 0 0 0  Number falls in past yr: 0 0 0  Injury with Fall? 0 0 0  Risk for fall due to : - - No Fall Risks  Follow up - Falls evaluation completed Education provided;Falls prevention discussed   PHQ 2/9 Scores 11/06/2019 11/06/2019 12/29/2018 08/28/2018 05/01/2018 03/03/2018 12/29/2017  PHQ - 2 Score 0 0 0 0 0 0 1    Assessment:   Care Plan Care Plan : Heart Failure (Adult)  Updates made by Verlin Grills, RN since 09/03/2020 12:00 AM     Problem: Symptom Exacerbation (Heart Failure)   Priority: High  Onset Date: 01/29/2020     Long-Range Goal: Symptom Exacerbation Prevented or Minimized   Start Date: 01/29/2020  Expected End Date: 02/06/2021  This Visit's Progress: On track  Recent Progress: On track  Priority: High  Note:   Evidence-based guidance:  Perform or review cognitive and/or health literacy screening.  Assess understanding of adherence and barriers to treatment plan, as well as lifestyle changes; develop strategies to address barriers.  Establish a mutually-agreed-upon early intervention process to communicate with primary care provider when signs/symptoms worsen.  Facilitate timely posthospital discharge or emergency department  treatment that includes intensive follow-up via telephone calls, home visit, telehealth monitoring and care at multidisciplinary heart failure clinic.  Adjust frequency and intensity of follow-up based on presentation, number of emergency department visits, hospital admissions and frequency and severity of symptom exacerbation.  Facilitate timely visit, usually within 1 week, with primary care provider following hospital discharge.  Collaborate with clinical pharmacist to address adverse drug reactions, drug interactions, subtherapeutic dosage, patient and family education.  Regularly screen for presence of depressive symptoms using a validated tool; consider pharmacologic therapy and/or referral for cognitive behavioral therapy when present.  Refer to community-based services, such as a heart failure support group, community Economist or peer support program.  Review immunization status; arrange receipt of needed vaccinations.  Prepare patient for home oxygen use based on signs/symptoms.   Notes:     Problem: Disease Progression (Heart Failure)   Priority: Medium  Onset Date: 01/29/2020     Goal: Comorbidities Identified and Managed   Start Date: 01/29/2020  Expected End Date: 02/06/2021  This Visit's Progress: Not on track  Priority: Medium  Note:   Evidence-based guidance:  Assess and address signs/symptoms of comorbidity, including dyslipidemia, diabetes, iron deficiency, gout, arthritis, dysrhythmia, hypertension, cachexia, coronary artery disease, kidney dysfunction and lung disease.  Prepare patient for laboratory and diagnostic exams based on risk and presentation.  Prepare for use of pharmacologic therapy that may include statin, angiotensin converting enzyme (ACE) inhibitor, angiotensin receptor blocker (ARB), beta-blocker, digoxin, antidysrhythmic, diuretic or omega-3 fatty acid.  Monitor side effects and anticipate need for periodic adjustments.  Prepare patient for  potential invasive treatment, such as implantable cardioverter-defibrillator, cardiac resynchronization therapy or heart transplant as disease progresses.   Notes:  Patient needs ETOH abuse treatment    Long-Range Goal: Health Optimized   Start Date: 01/29/2020  Expected End Date: 02/06/2021  This Visit's Progress: Not on track  Recent Progress: Not on track  Priority: High  Note:   Evidence-based guidance:  Use brief intervention, such as 5 A's (Ask, Advise, Assess, Assist, Arrange) to encourage smoking cessation; refer to smoking cessation program, if ready for more intensive intervention.  Perform or refer to a registered dietitian for a nutrition assessment and nutrition-focused physical exam.   Identify potential micronutrient deficiencies, such as iron, vitamin D and thiamin.  Assess need for potential diet and fluid modification, such as reduced sodium or fluid intake.  Minimize unnecessary dietary restrictions to increase oral intake. Note: Sodium restriction should be individualized to the patient and clinical status.  Facilitate home monitoring of weight.   Notes:  [id number] Patient needs treatment of Etoh abuse    Problem: Activity Tolerance (Heart Failure)   Priority: Medium  Onset Date: 01/28/2021     Goal: Activity Tolerance Optimized   Start Date: 01/29/2020  Expected End Date: 02/06/2021  This Visit's Progress: On track  Recent Progress: On track  Priority: Medium  Note:   Evidence-based guidance:  Promote daily physical activity that improves functional ability, cognition and quality of life.  Encourage reduction in sedentary time.  Encourage optimal, safe functional mobility and self-care performance based on ability and tolerance.   Promote breathing and energy conservation techniques, such as pursed-lip breathing, preplanning and pacing of activity, balancing activity and rest.  Encourage participation in cardiac rehabilitation services.   Notes:        Goals Addressed             This Visit's Progress    [facility]) No admissions for CHF exacerbation   On track    Timeframe:  Long-Range Goal Priority:  High Start Date:   [id number]                          Expected End Date:     [id number]  Follow up date [id number]                CARE PLAN ENTRY (see longitudinal plan of care for additional care plan information)   Current Barriers:  Knowledge deficit related to basic heart failure pathophysiology and self care management Financial strain  Case Manager Clinical Goal(s):  Over the next 90 days, patient will verbalize understanding of Heart Failure Action Plan and when to call doctor Over the next 90 days, patient will take all Heart Failure mediations as prescribed Over the next 90 days, patient will weigh daily and record (notifying MD of 3 lb weight gain over night or 5 lb in a week) Patient will verbalize receiving annual flu vaccine within the next 90 days.   Interventions:  Provided verbal education on low sodium diet Provided written education on low sodium diet Reviewed Heart Failure Action Plan in depth and provided written copy Discussed importance of daily weight and advised patient to weigh and record daily Reviewed role of diuretics in prevention of fluid overload and management of heart failure  Patient Self Care Activities:  Takes Heart Failure Medications as prescribed Weighs daily and record (notifying MD of 3 lb weight gain over night or 5 lb in a week) Verbalizes understanding of and follows CHF Action Plan Adheres to low sodium diet Patient needs encouragement to weigh and monitor blood pressure at home and not at the Dr office only NOTES: [id number] Pt has not had any recent admissions for CHF exacerbation [id number] no current admissions for COPD     (THN)Track and Manage Fluids and Swelling-Heart Failure   On track    Timeframe:  Short-Term Goal Priority:  High Start Date:   [id number]                           Expected End Date:   [id number]                 Follow Up [id number]    - call office if I gain more than 2 pounds in one day or 5 pounds in one week - keep legs up while sitting - track weight in diary - use salt in moderation - watch for swelling in feet, ankles and legs every day - weigh myself daily    Why is this important?   It is important to check your weight daily and watch how much salt and liquids you have.  It will help you to manage your heart failure.    Notes:  Patient needs to weigh self daily [id number] Per patient he is currently not having any swelling in lower extremities.  He is not weighing daily     (THN)Track and  Manage Symptoms-Heart Failure   On track    Timeframe:  Short-Term Goal Priority:  High Start Date:     [id number]                        Expected End Date:    [id number]                   Follow Up Date [id number]   - develop a rescue plan - eat more whole grains, fruits and vegetables, lean meats and healthy fats - follow rescue plan if symptoms flare-up - know when to call the doctor - track symptoms and what helps feel better or worse - dress right for the weather, hot or cold    Why is this important?   You will be able to handle your symptoms better if you keep track of them.  Making some simple changes to your lifestyle will help.  Eating healthy is one thing you can do to take good care of yourself.    Notes:  [id number] Patient understands to monitor for swelling, coughing [id number] Patient is monitoring self for symptoms. No coughing or swelling in extremities        Plan:  Follow-up: Patient agrees to Care Plan and Follow-up. Referred to Engineer, building services on CHF RN will follow up outreach within the month of October RN sent update assessment to PCP  [place] Management [phone]

## 2020-09-03 NOTE — Patient Instructions (Signed)
Marland Kitchen  Goals Addressed             This Visit's Progress    [facility]) No admissions for CHF exacerbation   On track    Timeframe:  Long-Range Goal Priority:  High Start Date:   [id number]                          Expected End Date:     [id number]  Follow up date [id number]                CARE PLAN ENTRY (see longitudinal plan of care for additional care plan information)   Current Barriers:  Knowledge deficit related to basic heart failure pathophysiology and self care management Financial strain  Case Manager Clinical Goal(s):  Over the next 90 days, patient will verbalize understanding of Heart Failure Action Plan and when to call doctor Over the next 90 days, patient will take all Heart Failure mediations as prescribed Over the next 90 days, patient will weigh daily and record (notifying MD of 3 lb weight gain over night or 5 lb in a week) Patient will verbalize receiving annual flu vaccine within the next 90 days.   Interventions:  Provided verbal education on low sodium diet Provided written education on low sodium diet Reviewed Heart Failure Action Plan in depth and provided written copy Discussed importance of daily weight and advised patient to weigh and record daily Reviewed role of diuretics in prevention of fluid overload and management of heart failure  Patient Self Care Activities:  Takes Heart Failure Medications as prescribed Weighs daily and record (notifying MD of 3 lb weight gain over night or 5 lb in a week) Verbalizes understanding of and follows CHF Action Plan Adheres to low sodium diet Patient needs encouragement to weigh and monitor blood pressure at home and not at the Dr office only NOTES: [id number] Pt has not had any recent admissions for CHF exacerbation [id number] no current admissions for COPD     (THN)Track and Manage Fluids and Swelling-Heart Failure   On track    Timeframe:  Short-Term Goal Priority:  High Start Date:   [id number]                           Expected End Date:   [id number]                 Follow Up [id number]    - call office if I gain more than 2 pounds in one day or 5 pounds in one week - keep legs up while sitting - track weight in diary - use salt in moderation - watch for swelling in feet, ankles and legs every day - weigh myself daily    Why is this important?   It is important to check your weight daily and watch how much salt and liquids you have.  It will help you to manage your heart failure.    Notes:  Patient needs to weigh self daily [id number] Per patient he is currently not having any swelling in lower extremities.  He is not weighing daily     (THN)Track and Manage Symptoms-Heart Failure   On track    Timeframe:  Short-Term Goal Priority:  High Start Date:     [id number]                        Expected End  Date:    [id number]                   Follow Up Date [id number]   - develop a rescue plan - eat more whole grains, fruits and vegetables, lean meats and healthy fats - follow rescue plan if symptoms flare-up - know when to call the doctor - track symptoms and what helps feel better or worse - dress right for the weather, hot or cold    Why is this important?   You will be able to handle your symptoms better if you keep track of them.  Making some simple changes to your lifestyle will help.  Eating healthy is one thing you can do to take good care of yourself.    Notes:  [id number] Patient understands to monitor for swelling, coughing [id number] Patient is monitoring self for symptoms. No coughing or swelling in extremities

## 2020-09-18 ENCOUNTER — Ambulatory Visit: Payer: Medicare Other | Admitting: Sports Medicine

## 2020-09-25 ENCOUNTER — Ambulatory Visit (INDEPENDENT_AMBULATORY_CARE_PROVIDER_SITE_OTHER): Payer: Medicare Other

## 2020-09-25 ENCOUNTER — Other Ambulatory Visit: Payer: Self-pay

## 2020-09-25 ENCOUNTER — Encounter: Payer: Self-pay | Admitting: Sports Medicine

## 2020-09-25 ENCOUNTER — Ambulatory Visit (INDEPENDENT_AMBULATORY_CARE_PROVIDER_SITE_OTHER): Payer: Medicare Other | Admitting: Sports Medicine

## 2020-09-25 DIAGNOSIS — M79672 Pain in left foot: Secondary | ICD-10-CM | POA: Diagnosis not present

## 2020-09-25 DIAGNOSIS — M21619 Bunion of unspecified foot: Secondary | ICD-10-CM | POA: Diagnosis not present

## 2020-09-25 DIAGNOSIS — M79671 Pain in right foot: Secondary | ICD-10-CM

## 2020-09-25 DIAGNOSIS — M21611 Bunion of right foot: Secondary | ICD-10-CM

## 2020-09-25 DIAGNOSIS — M21612 Bunion of left foot: Secondary | ICD-10-CM | POA: Diagnosis not present

## 2020-09-25 DIAGNOSIS — F1010 Alcohol abuse, uncomplicated: Secondary | ICD-10-CM

## 2020-09-25 DIAGNOSIS — M1000 Idiopathic gout, unspecified site: Secondary | ICD-10-CM

## 2020-09-25 MED ORDER — TRIAMCINOLONE ACETONIDE 10 MG/ML IJ SUSP: 10.0000 mg | Freq: Once | INTRAMUSCULAR | Status: DC

## 2020-09-25 MED ORDER — COLCHICINE 0.6 MG PO TABS: 0.6000 mg | ORAL_TABLET | Freq: Two times a day (BID) | ORAL | 0 refills | Status: DC

## 2020-09-25 NOTE — Progress Notes (Signed)
Subjective: Leonard [name] is a 70 y.o. male patient who presents to office for evaluation of left greater than right foot pain. Patient complains of progressive pain especially over the last several weeks admits to significant redness warmth swelling to the left foot that is unrelieved by anything.  Patient reports that he had a flareup like this for months ago and struggles with excessive drink of alcohol and states that over the last 2 weeks he has been eating more seafood.  Patient denies any other pedal complaints.   Patient Active Problem List   Diagnosis Date Noted   AKI (acute kidney injury) ([place]) 10/27/2018   Hypotension 10/27/2018   Alcohol abuse 12/28/2017   Glaucoma 12/28/2017   Nonischemic cardiomyopathy ([place]) 10/17/2017   History of pulmonary embolus (PE) 10/17/2017   Pulmonary embolism ([place]) 02/28/2017   Acute exacerbation of congestive heart failure ([place]) 02/24/2017   CHF exacerbation ([place]) 02/24/2017   Hypertensive urgency 02/24/2017   Pulmonary edema 02/24/2017   Acute kidney injury superimposed on chronic kidney disease ([place]) 02/24/2017   Hypertensive heart disease 09/27/2016   Tobacco abuse 09/27/2016   Mixed hyperlipidemia 09/27/2016   History of tobacco abuse 06/25/2016   Allergic sinusitis 04/17/2016   Asthma 01/19/2016   CKD (chronic kidney disease) stage 2, GFR 60-89 ml/min 01/19/2016   Aspiration pneumonitis ([place])    Essential hypertension    Heart failure with reduced ejection fraction ([place]) 01/10/2016   Malignant neoplasm of prostate ([place]) 07/03/2013    Current Outpatient Medications on File Prior to Visit  Medication Sig Dispense Refill   albuterol (PROVENTIL HFA;VENTOLIN HFA) 108 (90 Base) MCG/ACT inhaler Inhale 2 puffs into the lungs every 4 (four) hours as needed for wheezing or shortness of breath. 1 Inhaler 0   albuterol (PROVENTIL) (2.5 MG/3ML) 0.083% nebulizer solution Take 3 mLs (2.5 mg total) by nebulization every 6 (six) hours as needed for  wheezing or shortness of breath. 75 mL 2   amLODipine (NORVASC) 5 MG tablet Take 1 tablet by mouth daily 90 tablet 1   carvedilol (COREG) 25 MG tablet Take 1 tablet (25 mg total) by mouth 2 (two) times daily. 180 tablet 2   dorzolamide-timolol (COSOPT) 22.3-6.8 MG/ML ophthalmic solution Place 1 drop into both eyes 2 (two) times daily.     ENTRESTO 97-103 MG Take 1 tablet by mouth 2 (two) times daily.     furosemide (LASIX) 40 MG tablet Take 1 tablet (40 mg total) by mouth daily. You may take an extra tablet in the PM AS NEEDED for swelling up to twice a week 120 tablet 2   latanoprost (XALATAN) 0.005 % ophthalmic solution Place 1 drop into both eyes at bedtime.      SYMBICORT 160-4.5 MCG/ACT inhaler Inhale 1 puff into the lungs daily.  3   No current facility-administered medications on file prior to visit.    Allergies  Allergen Reactions   Losartan Rash and Other (See Comments)    Abdominal and leg rashes noted in 12/17    Objective:  General: Alert and oriented x3 in no acute distress  Dermatology: Focal Swelling, warmth, redness present on the left first MPJ, No open lesions bilateral lower extremities, no webspace macerations, no ecchymosis bilateral, all nails x 10 are well manicured.  Vascular: Dorsalis Pedis and Posterior Tibial pedal pulses 1/4, Capillary Fill Time 3 seconds,(+) pedal hair growth bilateral,Temperature gradient increased over the left first MPJ  Neurology: Gross sensation intact via light touch bilateral.  Musculoskeletal: There is tenderness with  palpation at left greater than right foot at the first MPJ that extends to the dorsal aspect of the foot as well with limited range of motion due to pain and swelling, strength within normal limits in all groups bilateral but there is guarding due to pain.   Gait: Unassisted, Antalgic gait avoiding weight on on left  Xrays  Left/Right Foot    Impression: Soft tissue swelling at first MPJ left greater than right,  increased intermetatarsal angle and bunion deformity noted bilateral.  No other acute osseous findings noted.       Assessment and Plan: Problem List Items Addressed This Visit       Other   Alcohol abuse   Other Visit Diagnoses     Bunion    -  Primary   Relevant Orders   DG Foot Complete Left   DG Foot Complete Right   Idiopathic gout, unspecified chronicity, unspecified site       Left foot pain       Right foot pain            -Complete examination performed -Xrays reviewed -Discussed treatement options for gouty arthritis and gout education provided - After oral consent, injected left foot at 1st MTPJ and midfoot with 1cc lidocaine and marcaine plain mixed with 0.25cc Kenalog-10 and Dexmethasone phosphate without complication; post injection care explained. -Rx Colchicine 0.'6mg'$  -Advised patient to call if symptoms are not improved within 1 week -Dispensed Surgigrip compression sleeves bilateral for edema control -Encouraged Alcohol cessation  -Patient to return PRN.  Landis Martins, DPM

## 2020-09-26 ENCOUNTER — Other Ambulatory Visit: Payer: Medicare Other | Admitting: *Deleted

## 2020-09-26 NOTE — Patient Outreach (Signed)
[facility]) Care Management  09/26/2020  Leonard [name] April 28, 1950 [id number]  CSW spoke with pt and confirmed identity. Pt is interested in pursuing etoh inpatient treatment. "I am back in [place] and ready". CSW acknowledged pt's readiness/willingness and directed him to next steps to call Insurance [facility] line and ask for in-network providers/options be mail or emailed to him. Pt states he will do this this week and then can call to inquire and interview with potential sites. Pt appears very motivated and support offered. Pt to call CSW if questions or needs arise prior to CSW's follow up call in 2-3 weeks.   Eduard Clos, MSW, [place] Worker  [place] [phone]

## 2020-10-02 ENCOUNTER — Encounter: Payer: Self-pay | Admitting: Sports Medicine

## 2020-10-02 ENCOUNTER — Other Ambulatory Visit: Payer: Self-pay

## 2020-10-02 ENCOUNTER — Ambulatory Visit (INDEPENDENT_AMBULATORY_CARE_PROVIDER_SITE_OTHER): Payer: Medicare Other | Admitting: Sports Medicine

## 2020-10-02 DIAGNOSIS — M79671 Pain in right foot: Secondary | ICD-10-CM | POA: Diagnosis not present

## 2020-10-02 DIAGNOSIS — M79672 Pain in left foot: Secondary | ICD-10-CM

## 2020-10-02 DIAGNOSIS — M21619 Bunion of unspecified foot: Secondary | ICD-10-CM | POA: Diagnosis not present

## 2020-10-02 DIAGNOSIS — M1000 Idiopathic gout, unspecified site: Secondary | ICD-10-CM | POA: Diagnosis not present

## 2020-10-02 DIAGNOSIS — F1010 Alcohol abuse, uncomplicated: Secondary | ICD-10-CM

## 2020-10-02 MED ORDER — PREDNISONE 10 MG (21) PO TBPK: ORAL_TABLET | ORAL | 0 refills | Status: DC

## 2020-10-02 NOTE — Progress Notes (Signed)
Subjective: Leonard [name] is a 70 y.o. male patient who presents to office for follow-up evaluation of gout and bunion pain.  Patient reports that he is doing much better after I gave him a shot last visit but states that he is still concerned about bunion states he has had these for years.  Patient also admits occasional heel pain when he gets out of bed in the morning and both feet.  Patient denies any other pedal complaints.   Patient Active Problem List   Diagnosis Date Noted   AKI (acute kidney injury) ([place]) 10/27/2018   Hypotension 10/27/2018   Alcohol abuse 12/28/2017   Glaucoma 12/28/2017   Nonischemic cardiomyopathy ([place]) 10/17/2017   History of pulmonary embolus (PE) 10/17/2017   Pulmonary embolism ([place]) 02/28/2017   Acute exacerbation of congestive heart failure ([place]) 02/24/2017   CHF exacerbation ([place]) 02/24/2017   Hypertensive urgency 02/24/2017   Pulmonary edema 02/24/2017   Acute kidney injury superimposed on chronic kidney disease ([place]) 02/24/2017   Hypertensive heart disease 09/27/2016   Tobacco abuse 09/27/2016   Mixed hyperlipidemia 09/27/2016   History of tobacco abuse 06/25/2016   Allergic sinusitis 04/17/2016   Asthma 01/19/2016   CKD (chronic kidney disease) stage 2, GFR 60-89 ml/min 01/19/2016   Aspiration pneumonitis ([place])    Essential hypertension    Heart failure with reduced ejection fraction ([place]) 01/10/2016   Malignant neoplasm of prostate ([place]) 07/03/2013    Current Outpatient Medications on File Prior to Visit  Medication Sig Dispense Refill   albuterol (PROVENTIL HFA;VENTOLIN HFA) 108 (90 Base) MCG/ACT inhaler Inhale 2 puffs into the lungs every 4 (four) hours as needed for wheezing or shortness of breath. 1 Inhaler 0   albuterol (PROVENTIL) (2.5 MG/3ML) 0.083% nebulizer solution Take 3 mLs (2.5 mg total) by nebulization every 6 (six) hours as needed for wheezing or shortness of breath. 75 mL 2   amLODipine (NORVASC) 5 MG tablet Take 1 tablet by  mouth daily 90 tablet 1   carvedilol (COREG) 25 MG tablet Take 1 tablet (25 mg total) by mouth 2 (two) times daily. 180 tablet 2   colchicine 0.6 MG tablet Take 1 tablet (0.6 mg total) by mouth 2 (two) times daily. 14 tablet 0   dorzolamide-timolol (COSOPT) 22.3-6.8 MG/ML ophthalmic solution Place 1 drop into both eyes 2 (two) times daily.     ENTRESTO 97-103 MG Take 1 tablet by mouth 2 (two) times daily.     furosemide (LASIX) 40 MG tablet Take 1 tablet (40 mg total) by mouth daily. You may take an extra tablet in the PM AS NEEDED for swelling up to twice a week 120 tablet 2   latanoprost (XALATAN) 0.005 % ophthalmic solution Place 1 drop into both eyes at bedtime.      SYMBICORT 160-4.5 MCG/ACT inhaler Inhale 1 puff into the lungs daily.  3   Current Facility-Administered Medications on File Prior to Visit  Medication Dose Route Frequency Provider Last Rate Last Admin   triamcinolone acetonide (KENALOG) 10 MG/ML injection 10 mg  10 mg Other Once Landis Martins, DPM        Allergies  Allergen Reactions   Losartan Rash and Other (See Comments)    Abdominal and leg rashes noted in 12/17    Objective:  General: Alert and oriented x3 in no acute distress  Dermatology: Resolved swelling, warmth, redness present on the left first MPJ, No open lesions bilateral lower extremities, no webspace macerations, no ecchymosis bilateral, all nails x 10 are  well manicured.  Vascular: Dorsalis Pedis and Posterior Tibial pedal pulses 1/4, Capillary Fill Time 3 seconds,(+) pedal hair growth bilateral,Temperature gradient increased over the left first MPJ  Neurology: Gross sensation intact via light touch bilateral.  Musculoskeletal: There is no significant reproducible tenderness with palpation at left or right foot at the first MPJ or dorsum of the forefoot.  There is subjective pain in the morning when he is getting out of bed in the heels but no current pain to palpation to the plantar fascia at this  time.  Strength within normal limits in all groups bilateral.       Assessment and Plan: Problem List Items Addressed This Visit       Other   Alcohol abuse   Other Visit Diagnoses     Bunion    -  Primary   Idiopathic gout, unspecified chronicity, unspecified site       Left foot pain       Right foot pain            -Complete examination performed -Rx Prednisone to help with any residual pain over the bunions however clinically the gout attack from last week is much improved -Dispensed heel cushions for patient to use as directed -Advised patient that he is not a surgery candidate at this time due to alcohol abuse history -Encouraged alcohol cessation -Patient to return PRN.  Landis Martins, DPM

## 2020-10-08 ENCOUNTER — Encounter (INDEPENDENT_AMBULATORY_CARE_PROVIDER_SITE_OTHER): Payer: Self-pay

## 2020-10-08 ENCOUNTER — Encounter: Payer: Self-pay | Admitting: Physician Assistant

## 2020-10-08 ENCOUNTER — Other Ambulatory Visit: Payer: Self-pay

## 2020-10-08 ENCOUNTER — Ambulatory Visit (INDEPENDENT_AMBULATORY_CARE_PROVIDER_SITE_OTHER): Payer: Medicare Other | Admitting: Physician Assistant

## 2020-10-08 VITALS — BP 124/54 | HR 75 | Ht 67.0 in | Wt 173.8 lb

## 2020-10-08 DIAGNOSIS — I351 Nonrheumatic aortic (valve) insufficiency: Secondary | ICD-10-CM

## 2020-10-08 DIAGNOSIS — I480 Paroxysmal atrial fibrillation: Secondary | ICD-10-CM | POA: Diagnosis not present

## 2020-10-08 DIAGNOSIS — R222 Localized swelling, mass and lump, trunk: Secondary | ICD-10-CM

## 2020-10-08 DIAGNOSIS — I5020 Unspecified systolic (congestive) heart failure: Secondary | ICD-10-CM

## 2020-10-08 DIAGNOSIS — I110 Hypertensive heart disease with heart failure: Secondary | ICD-10-CM

## 2020-10-08 DIAGNOSIS — I712 Thoracic aortic aneurysm, without rupture, unspecified: Secondary | ICD-10-CM

## 2020-10-08 DIAGNOSIS — N182 Chronic kidney disease, stage 2 (mild): Secondary | ICD-10-CM

## 2020-10-08 DIAGNOSIS — I5022 Chronic systolic (congestive) heart failure: Secondary | ICD-10-CM

## 2020-10-08 DIAGNOSIS — F1010 Alcohol abuse, uncomplicated: Secondary | ICD-10-CM

## 2020-10-08 DIAGNOSIS — I2510 Atherosclerotic heart disease of native coronary artery without angina pectoris: Secondary | ICD-10-CM | POA: Diagnosis not present

## 2020-10-08 DIAGNOSIS — Z720 Tobacco use: Secondary | ICD-10-CM

## 2020-10-08 NOTE — Patient Instructions (Signed)
Medication Instructions:   TAKE 1 EXTRA DOSE OF LASIX BY MOUTH ( 40 MG) TODAY ONLY.   *If you need a refill on your cardiac medications before your next appointment, please call your pharmacy*   Lab Work:  TODAY!!!!!!  BMET/PRO BNP  If you have labs (blood work) drawn today and your tests are completely normal, you will receive your results only by: [place] (if you have MyChart) OR A paper copy in the mail If you have any lab test that is abnormal or we need to change your treatment, we will call you to review the results.   Testing/Procedures: Non-Cardiac CT Angiography (CTA), is a special type of CT scan that uses a computer to produce multi-dimensional views of major blood vessels throughout the body. In CT angiography, a contrast material is injected through an IV to help visualize the blood vessels  Your physician has requested that you have an echocardiogram. Echocardiography is a painless test that uses sound waves to create images of your heart. It provides your doctor with information about the size and shape of your heart and how well your heart's chambers and valves are working. This procedure takes approximately one hour. There are no restrictions for this procedure.    Follow-Up: At [facility], you and your health needs are our priority.  As part of our continuing mission to provide you with exceptional heart care, we have created designated Provider Care Teams.  These Care Teams include your primary Cardiologist (physician) and Advanced Practice Providers (APPs -  Physician Assistants and Nurse Practitioners) who all work together to provide you with the care you need, when you need it.  We recommend signing up for the patient portal called "MyChart".  Sign up information is provided on this After Visit Summary.  MyChart is used to connect with patients for Virtual Visits (Telemedicine).  Patients are able to view lab/test results, encounter notes, upcoming  appointments, etc.  Non-urgent messages can be sent to your provider as well.   To learn more about what you can do with MyChart, go to [email or web address].    Your next appointment:   6 week(s)  The format for your next appointment:   In Person  Provider:   Richardson Dopp, PA-C   Other Instructions

## 2020-10-08 NOTE — Progress Notes (Signed)
Cardiology Office Note:    Date:  10/08/2020   ID:  Leonard [name], DOB 11/19/1950, MRN [id number]  PCP:  Arlon Holms, NP   [facility] Providers Cardiologist:  [name], MD      Referring MD: Josha Holms, NP   Chief Complaint:  F/u CHF    Patient Profile:    Leonard [name] is a 70 y.o. male with:  (HFrEF) heart failure with reduced ejection fraction Non-ischemic cardiomyopathy  Echocardiogram 1/19: EF 30-35, Gr 1 DD, Mod AI, mild MR Hx of cough w/ ACE and rash w/ ARB Coronary artery disease  Mild to mod non-obstructive dz on cath in 2018 Paroxysmal atrial fibrillation  Documented in 2020 [place] in 3/21 pt declined anticoagulation  Aortic insufficiency  Mod by echocardiogram in 2019 Mild mitral regurgitation (echocardiogram 2019) Hx of pulmonary embolism in 02/2017 Completed 6 mos Rivaroxaban Ascending thoracic aortic aneurysm CT 2019: 4 cm  Hypertensive heart disease Chronic kidney disease  +Cigs +ETOH COPD Gout Prostate CA Hx of poor medication adherence    Prior CV studies:  Chest CTA 02/28/2017 IMPRESSION: 1. Acute pulmonary embolism within the right middle lobe lateral segmental artery. No right heart strain. 2. Mild aneurysmal dilatation of the ascending thoracic aorta, measuring 4.0 cm. Recommend annual imaging followup by CTA or MRA. This recommendation follows 2010 ACCF/AHA/AATS/ACR/ASA/SCA/SCAI/SIR/STS/SVM Guidelines for the Diagnosis and Management of Patients with Thoracic Aortic Disease. Circulation. 2010; 121: [id number] 3. Moderate central peribronchial thickening, likely smoking-related. 4.  Emphysema (ICD10-J43.9).   Aortic aneurysm NOS (ICD10-I71.9).   RIGHT/LEFT HEART CATH AND CORONARY ANGIOGRAPHY 06/10/2016 Narrative  Prox RCA lesion, 40 %stenosed.  Dist RCA lesion, 25 %stenosed.  Ramus lesion, 25 %stenosed.  Mid LAD lesion, 20 %stenosed.  EF 35-45%   No AAA. Mild left renal artery stenosis. No right renal artery  stenosis. Nonobstructive coronary artery disease.  Patient with nonischemic cardiomyopathy, likely related to elevated blood pressure.   ECHOCARDIOGRAM 02/24/17 Mild conc LVH, EF 30-35, diff HK, Gr 1 DD, mod AI, mild MR, mod LAE, normal RVSF, trivial TR   History of Present Illness: [name] was last seen by Dr. Irish Lack in 3/21.  He was to follow-up in 2 months but did not show for his appointment.  He saw his new PCP yesterday and was told to f/u with Cardiology.  I do not have any records.  He tells me he had labs done.  I will request records.  He has not really had significant shortness of breath.  He has not had chest pain, syncope, orthopnea.  He has noted increased leg edema today.  He was recently treated for gout.  He continues to smoke and drink a lot of ETOH.  He is trying to get into AA.          Past Medical History:  Diagnosis Date   Asthma    CHF (congestive heart failure) (HCC)    Hypertension    Prostate cancer (HCC)     Current Medications: Current Meds  Medication Sig   albuterol (PROVENTIL HFA;VENTOLIN HFA) 108 (90 Base) MCG/ACT inhaler Inhale 2 puffs into the lungs every 4 (four) hours as needed for wheezing or shortness of breath.   albuterol (PROVENTIL) (2.5 MG/3ML) 0.083% nebulizer solution Take 3 mLs (2.5 mg total) by nebulization every 6 (six) hours as needed for wheezing or shortness of breath.   amLODipine (NORVASC) 5 MG tablet Take 1 tablet by mouth daily   carvedilol (COREG) 25 MG tablet Take 1 tablet (25 mg  total) by mouth 2 (two) times daily.   colchicine 0.6 MG tablet Take 1 tablet (0.6 mg total) by mouth 2 (two) times daily.   dorzolamide-timolol (COSOPT) 22.3-6.8 MG/ML ophthalmic solution Place 1 drop into both eyes 2 (two) times daily.   ENTRESTO 97-103 MG Take 1 tablet by mouth 2 (two) times daily.   furosemide (LASIX) 40 MG tablet Take 1 tablet (40 mg total) by mouth daily. You may take an extra tablet in the PM AS NEEDED for swelling up to  twice a week   latanoprost (XALATAN) 0.005 % ophthalmic solution Place 1 drop into both eyes at bedtime.    predniSONE (STERAPRED UNI-PAK 21 TAB) 10 MG (21) TBPK tablet Take as directed   SYMBICORT 160-4.5 MCG/ACT inhaler Inhale 1 puff into the lungs daily.   Current Facility-Administered Medications for the 10/08/20 encounter (Office Visit) with Liliane Shi, PA-C  Medication   triamcinolone acetonide (KENALOG) 10 MG/ML injection 10 mg     Allergies:   Losartan   Social History   Tobacco Use   Smoking status: Every Day    Packs/day: 0.25    Years: 30.00    Pack years: 7.50    Types: Cigarettes    Last attempt to quit: 12/24/2015    Years since quitting: 4.7   Smokeless tobacco: Never   Tobacco comments:    STtses he has cut down from 2 PPD to 4 cigarettes per day  Vaping Use   Vaping Use: Never used  Substance Use Topics   Alcohol use: Yes    Alcohol/week: 6.0 - 7.0 standard drinks    Types: 6 - 7 Shots of liquor per week    Comment: states 6-7 shots of liquor daily   Drug use: No    Types: Marijuana     Family Hx: The patient's family history includes Hypertension in his sister. There is no history of Cancer.  Review of Systems  Cardiovascular:  Negative for claudication.  Gastrointestinal:  Positive for bloating. Negative for hematochezia.  Genitourinary:  Negative for hematuria.    EKGs/Labs/Other Test Reviewed:    EKG:  EKG is   ordered today.  The ekg ordered today demonstrates NSR, HR 75, normal axis, low voltage, non-specific ST-TW changes, QTc 435 ms  Recent Labs: No results found for requested labs within last 8760 hours.   Recent Lipid Panel Lab Results  Component Value Date/Time   CHOL 183 10/18/2017 08:43 AM   TRIG 79 10/18/2017 08:43 AM   HDL 84 10/18/2017 08:43 AM   LDLCALC 83 10/18/2017 08:43 AM      Risk Assessment/Calculations:    CHA2DS2-VASc Score = 4  This indicates a 4.8% annual risk of stroke. The patient's score is based  upon: CHF History: Yes HTN History: Yes Diabetes History: No Stroke History: No Vascular Disease History: Yes Age Score: 1 Gender Score: 0        Physical Exam:    VS:  BP (!) 124/54   Pulse 75   Ht '5\' 7"'$  (1.702 m)   Wt 173 lb 12.8 oz (78.8 kg)   SpO2 98%   BMI 27.22 kg/m     Wt Readings from Last 3 Encounters:  10/08/20 173 lb 12.8 oz (78.8 kg)  04/17/19 192 lb (87.1 kg)  07/13/18 187 lb (84.8 kg)     Constitutional:      Appearance: Healthy appearance. Not in distress.  Neck:     Vascular: No JVR. JVD normal.  Pulmonary:  Effort: Pulmonary effort is normal.     Breath sounds: No wheezing. No rales.  Chest:     Chest wall: Mass (Lg R upper chest cyst vs lipoma) and deformity (R pectoralis m absent) present.  Cardiovascular:     Normal rate. Regular rhythm. Normal S1. Normal S2.      Murmurs: There is no murmur.  Edema:    Peripheral edema present.    Ankle: bilateral trace edema of the ankle. Abdominal:     General: There is distension.     Palpations: Abdomen is soft. There is no hepatomegaly.  Skin:    General: Skin is warm and dry.  Neurological:     General: No focal deficit present.     Mental Status: Alert and oriented to person, place and time.     Cranial Nerves: Cranial nerves are intact.        ASSESSMENT & PLAN:    1. HFrEF (heart failure with reduced ejection fraction) (HCC) EF 30-35 by echocardiogram 1/19.  NYHA II-IIb.  He has evidence of mild volume excess on exam today.  He did see a new PCP yesterday.  He was asked to follow-up with cardiology and was added onto my schedule today.  I do not have any records or copy of labs done from yesterday.  I will try to obtain those.  I will obtain a BMET and BNP.  I have asked him to take an extra dose of furosemide 40 mg x 1 today.  If he has an elevated BNP on lab work, I will adjust his daily dose of diuretic.  Continue carvedilol 25 mg twice daily, Entresto 97/23 mg twice daily.  Arrange follow-up  echocardiogram.  If EF remains severely reduced, I will try to place him on spironolactone and SGLT2 inhibitor.  Follow-up in 6 to 8 weeks.  2. Coronary artery disease involving native coronary artery of native heart without angina pectoris Nonobstructive disease by cardiac catheterization 2018.  He is not having anginal symptoms.  He is no longer on statin therapy for unclear reasons.  We can revisit this at follow-up.  3. Hypertensive heart disease with chronic systolic congestive heart failure ([place]) Blood pressures well controlled.  Continue amlodipine 5 mg daily, carvedilol 25 mg twice daily, Entresto 97/23 mg twice daily.  4. PAF (paroxysmal atrial fibrillation) (HCC) Maintaining sinus rhythm.  She has declined anticoagulation in the past.  5. CKD (chronic kidney disease) stage 2, GFR 60-89 ml/min He notes that his creatinine was abnormal yesterday.  As noted, I do not have any copy of his labs.  I will repeat a BMET today.  6. Thoracic aortic aneurysm without rupture [facility]) He has not had follow-up on his thoracic aortic aneurysm since 2019.  Arrange chest CTA.  7. Nonrheumatic aortic valve insufficiency As noted, follow-up echocardiogram will be obtained.  8. Chest mass He has what appears to be a cyst or lipoma in his right upper chest.  Proceed with chest CTA as noted.  If this is not characterized on this study, I will refer him back to primary care to further evaluate and determine if he needs an ultrasound or MRI.    9. Tobacco abuse I have recommended cessation.  10. Alcohol abuse He notes he is trying to quit and is seeking out services such as [facility].    Dispo:  Return in about 6 weeks (around 11/19/2020) for Routine Follow Up w/ Dr. Irish Lack, or Richardson Dopp, PA-C.   Medication Adjustments/Labs and  Tests Ordered: Current medicines are reviewed at length with the patient today.  Concerns regarding medicines are outlined above.  Tests Ordered: Orders  Placed This Encounter  Procedures   CT ANGIO CHEST AORTA W/CM & OR WO/CM   Basic Metabolic Panel (BMET)   Pro b natriuretic peptide   EKG 12-Lead   ECHOCARDIOGRAM COMPLETE   Medication Changes: No orders of the defined types were placed in this encounter.   Signed, Richardson Dopp, PA-C  10/08/2020 5:31 PM    [facility] [address], [place], [place]  [address] Phone: [phone]; Fax: [phone]

## 2020-10-09 ENCOUNTER — Encounter (HOSPITAL_COMMUNITY): Payer: Self-pay | Admitting: Emergency Medicine

## 2020-10-09 ENCOUNTER — Inpatient Hospital Stay (HOSPITAL_COMMUNITY): Payer: Medicare Other

## 2020-10-09 ENCOUNTER — Emergency Department (HOSPITAL_COMMUNITY): Payer: Medicare Other

## 2020-10-09 ENCOUNTER — Inpatient Hospital Stay (HOSPITAL_COMMUNITY)
Admission: EM | Admit: 2020-10-09 | Discharge: 2020-10-10 | DRG: 445 | Discharge status: Against Medical Advice | Payer: Medicare Other | Attending: Family Medicine | Admitting: Family Medicine

## 2020-10-09 DIAGNOSIS — E782 Mixed hyperlipidemia: Secondary | ICD-10-CM | POA: Diagnosis present

## 2020-10-09 DIAGNOSIS — N182 Chronic kidney disease, stage 2 (mild): Secondary | ICD-10-CM | POA: Diagnosis present

## 2020-10-09 DIAGNOSIS — M109 Gout, unspecified: Secondary | ICD-10-CM | POA: Diagnosis present

## 2020-10-09 DIAGNOSIS — F17210 Nicotine dependence, cigarettes, uncomplicated: Secondary | ICD-10-CM | POA: Diagnosis present

## 2020-10-09 DIAGNOSIS — K8050 Calculus of bile duct without cholangitis or cholecystitis without obstruction: Secondary | ICD-10-CM | POA: Diagnosis present

## 2020-10-09 DIAGNOSIS — Z7951 Long term (current) use of inhaled steroids: Secondary | ICD-10-CM

## 2020-10-09 DIAGNOSIS — Z8546 Personal history of malignant neoplasm of prostate: Secondary | ICD-10-CM | POA: Diagnosis not present

## 2020-10-09 DIAGNOSIS — E876 Hypokalemia: Secondary | ICD-10-CM | POA: Diagnosis present

## 2020-10-09 DIAGNOSIS — K828 Other specified diseases of gallbladder: Secondary | ICD-10-CM | POA: Diagnosis not present

## 2020-10-09 DIAGNOSIS — I080 Rheumatic disorders of both mitral and aortic valves: Secondary | ICD-10-CM | POA: Diagnosis present

## 2020-10-09 DIAGNOSIS — Z20822 Contact with and (suspected) exposure to covid-19: Secondary | ICD-10-CM | POA: Diagnosis present

## 2020-10-09 DIAGNOSIS — Z79899 Other long term (current) drug therapy: Secondary | ICD-10-CM

## 2020-10-09 DIAGNOSIS — I712 Thoracic aortic aneurysm, without rupture: Secondary | ICD-10-CM | POA: Diagnosis present

## 2020-10-09 DIAGNOSIS — Z8249 Family history of ischemic heart disease and other diseases of the circulatory system: Secondary | ICD-10-CM | POA: Diagnosis not present

## 2020-10-09 DIAGNOSIS — I428 Other cardiomyopathies: Secondary | ICD-10-CM | POA: Diagnosis present

## 2020-10-09 DIAGNOSIS — I5022 Chronic systolic (congestive) heart failure: Secondary | ICD-10-CM | POA: Diagnosis present

## 2020-10-09 DIAGNOSIS — I130 Hypertensive heart and chronic kidney disease with heart failure and stage 1 through stage 4 chronic kidney disease, or unspecified chronic kidney disease: Secondary | ICD-10-CM | POA: Diagnosis present

## 2020-10-09 DIAGNOSIS — K8071 Calculus of gallbladder and bile duct without cholecystitis with obstruction: Secondary | ICD-10-CM | POA: Diagnosis present

## 2020-10-09 DIAGNOSIS — K8043 Calculus of bile duct with acute cholecystitis with obstruction: Secondary | ICD-10-CM | POA: Diagnosis not present

## 2020-10-09 DIAGNOSIS — Z86711 Personal history of pulmonary embolism: Secondary | ICD-10-CM | POA: Diagnosis not present

## 2020-10-09 DIAGNOSIS — F1010 Alcohol abuse, uncomplicated: Secondary | ICD-10-CM | POA: Diagnosis present

## 2020-10-09 DIAGNOSIS — Z01810 Encounter for preprocedural cardiovascular examination: Secondary | ICD-10-CM | POA: Diagnosis not present

## 2020-10-09 DIAGNOSIS — Z888 Allergy status to other drugs, medicaments and biological substances status: Secondary | ICD-10-CM

## 2020-10-09 DIAGNOSIS — J449 Chronic obstructive pulmonary disease, unspecified: Secondary | ICD-10-CM | POA: Diagnosis present

## 2020-10-09 DIAGNOSIS — I700 Atherosclerosis of aorta: Secondary | ICD-10-CM | POA: Diagnosis present

## 2020-10-09 DIAGNOSIS — I480 Paroxysmal atrial fibrillation: Secondary | ICD-10-CM | POA: Diagnosis present

## 2020-10-09 DIAGNOSIS — I10 Essential (primary) hypertension: Secondary | ICD-10-CM | POA: Diagnosis not present

## 2020-10-09 DIAGNOSIS — I2510 Atherosclerotic heart disease of native coronary artery without angina pectoris: Secondary | ICD-10-CM | POA: Diagnosis present

## 2020-10-09 DIAGNOSIS — K8051 Calculus of bile duct without cholangitis or cholecystitis with obstruction: Principal | ICD-10-CM | POA: Diagnosis present

## 2020-10-09 HISTORY — DX: Chronic obstructive pulmonary disease, unspecified: J44.9

## 2020-10-09 HISTORY — DX: Nonrheumatic mitral (valve) insufficiency: I34.0

## 2020-10-09 HISTORY — DX: Atherosclerotic heart disease of native coronary artery without angina pectoris: I25.10

## 2020-10-09 HISTORY — DX: Alcohol abuse, uncomplicated: F10.10

## 2020-10-09 HISTORY — DX: Other cardiomyopathies: I42.8

## 2020-10-09 HISTORY — DX: Paroxysmal atrial fibrillation: I48.0

## 2020-10-09 HISTORY — DX: Gout, unspecified: M10.9

## 2020-10-09 HISTORY — DX: Chronic kidney disease, stage 2 (mild): N18.2

## 2020-10-09 HISTORY — DX: Tobacco use: Z72.0

## 2020-10-09 HISTORY — DX: Nonrheumatic aortic (valve) insufficiency: I35.1

## 2020-10-09 HISTORY — DX: Atherosclerosis of renal artery: I70.1

## 2020-10-09 HISTORY — DX: Unspecified systolic (congestive) heart failure: I50.20

## 2020-10-09 LAB — CBC WITH DIFFERENTIAL/PLATELET
Abs Immature Granulocytes: 0.05 10*3/uL (ref 0.00–0.07)
Basophils Absolute: 0 10*3/uL (ref 0.0–0.1)
Basophils Relative: 0 %
Eosinophils Absolute: 0.2 10*3/uL (ref 0.0–0.5)
Eosinophils Relative: 1 %
HCT: 39.1 % (ref 39.0–52.0)
Hemoglobin: 13.7 g/dL (ref 13.0–17.0)
Immature Granulocytes: 0 %
Lymphocytes Relative: 12 %
Lymphs Abs: 1.7 10*3/uL (ref 0.7–4.0)
MCH: 34.4 pg — ABNORMAL HIGH (ref 26.0–34.0)
MCHC: 35 g/dL (ref 30.0–36.0)
MCV: 98.2 fL (ref 80.0–100.0)
Monocytes Absolute: 1 10*3/uL (ref 0.1–1.0)
Monocytes Relative: 7 %
Neutro Abs: 10.9 10*3/uL — ABNORMAL HIGH (ref 1.7–7.7)
Neutrophils Relative %: 80 %
Platelets: 235 10*3/uL (ref 150–400)
RBC: 3.98 MIL/uL — ABNORMAL LOW (ref 4.22–5.81)
RDW: 12.8 % (ref 11.5–15.5)
WBC: 13.8 10*3/uL — ABNORMAL HIGH (ref 4.0–10.5)
nRBC: 0 % (ref 0.0–0.2)

## 2020-10-09 LAB — COMPREHENSIVE METABOLIC PANEL
ALT: 119 U/L — ABNORMAL HIGH (ref 0–44)
AST: 489 U/L — ABNORMAL HIGH (ref 15–41)
Albumin: 3.4 g/dL — ABNORMAL LOW (ref 3.5–5.0)
Alkaline Phosphatase: 97 U/L (ref 38–126)
Anion gap: 11 (ref 5–15)
BUN: 13 mg/dL (ref 8–23)
CO2: 32 mmol/L (ref 22–32)
Calcium: 9.3 mg/dL (ref 8.9–10.3)
Chloride: 97 mmol/L — ABNORMAL LOW (ref 98–111)
Creatinine, Ser: 1.01 mg/dL (ref 0.61–1.24)
GFR, Estimated: >60 mL/min (ref >60)
Glucose, Bld: 126 mg/dL — ABNORMAL HIGH (ref 70–99)
Potassium: 2.6 mmol/L — CL (ref 3.5–5.1)
Sodium: 140 mmol/L (ref 135–145)
Total Bilirubin: 2.7 mg/dL — ABNORMAL HIGH (ref 0.3–1.2)
Total Protein: 6.5 g/dL (ref 6.5–8.1)

## 2020-10-09 LAB — URINALYSIS, ROUTINE W REFLEX MICROSCOPIC
Bacteria, UA: NONE SEEN
Bilirubin Urine: NEGATIVE
Glucose, UA: NEGATIVE mg/dL
Ketones, ur: NEGATIVE mg/dL
Leukocytes,Ua: NEGATIVE
Nitrite: NEGATIVE
Protein, ur: 30 mg/dL — AB
Specific Gravity, Urine: 1.014 (ref 1.005–1.030)
pH: 6 (ref 5.0–8.0)

## 2020-10-09 LAB — LIPASE, BLOOD: Lipase: 26 U/L (ref 11–51)

## 2020-10-09 MED ORDER — HYDROMORPHONE HCL 1 MG/ML IJ SOLN
1.0000 mg | Freq: Once | INTRAMUSCULAR | Status: AC
  Administered 2020-10-09: 1 mg via INTRAVENOUS
  Filled 2020-10-09: qty 1

## 2020-10-09 MED ORDER — POTASSIUM CHLORIDE 10 MEQ/100ML IV SOLN
10.0000 meq | INTRAVENOUS | Status: AC
Start: 2020-10-09 — End: 2020-10-09
  Administered 2020-10-09 (×2): 10 meq via INTRAVENOUS
  Filled 2020-10-09 (×2): qty 100

## 2020-10-09 MED ORDER — SACUBITRIL-VALSARTAN 97-103 MG PO TABS
1.0000 | ORAL_TABLET | Freq: Two times a day (BID) | ORAL | Status: DC
  Administered 2020-10-10 (×2): 1 via ORAL
  Filled 2020-10-09 (×3): qty 1

## 2020-10-09 MED ORDER — ONDANSETRON HCL 4 MG/2ML IJ SOLN
4.0000 mg | Freq: Once | INTRAMUSCULAR | Status: AC
  Administered 2020-10-09: 4 mg via INTRAVENOUS
  Filled 2020-10-09: qty 2

## 2020-10-09 MED ORDER — AMLODIPINE BESYLATE 5 MG PO TABS
5.0000 mg | ORAL_TABLET | Freq: Every day | ORAL | Status: DC
  Administered 2020-10-10: 5 mg via ORAL
  Filled 2020-10-09: qty 1

## 2020-10-09 MED ORDER — THIAMINE HCL 100 MG PO TABS
100.0000 mg | ORAL_TABLET | Freq: Every day | ORAL | Status: DC
  Administered 2020-10-10: 100 mg via ORAL
  Filled 2020-10-09: qty 1

## 2020-10-09 MED ORDER — POTASSIUM CHLORIDE 10 MEQ/100ML IV SOLN
10.0000 meq | INTRAVENOUS | Status: AC
  Administered 2020-10-10 (×4): 10 meq via INTRAVENOUS
  Filled 2020-10-09 (×4): qty 100

## 2020-10-09 MED ORDER — MOMETASONE FURO-FORMOTEROL FUM 200-5 MCG/ACT IN AERO: 2.0000 | INHALATION_SPRAY | Freq: Two times a day (BID) | RESPIRATORY_TRACT | Status: DC

## 2020-10-09 MED ORDER — LACTATED RINGERS IV SOLN: INTRAVENOUS | Status: DC

## 2020-10-09 MED ORDER — LORAZEPAM 1 MG PO TABS: 1.0000 mg | ORAL_TABLET | ORAL | Status: DC | PRN

## 2020-10-09 MED ORDER — IOHEXOL 350 MG/ML SOLN
100.0000 mL | Freq: Once | INTRAVENOUS | Status: AC | PRN
  Administered 2020-10-09: 100 mL via INTRAVENOUS

## 2020-10-09 MED ORDER — HYDROMORPHONE HCL 1 MG/ML IJ SOLN
0.5000 mg | INTRAMUSCULAR | Status: DC | PRN
  Administered 2020-10-10: 1 mg via INTRAVENOUS
  Filled 2020-10-09: qty 1

## 2020-10-09 MED ORDER — LATANOPROST 0.005 % OP SOLN: 1.0000 [drp] | Freq: Every day | OPHTHALMIC | Status: DC

## 2020-10-09 MED ORDER — THIAMINE HCL 100 MG/ML IJ SOLN: 100.0000 mg | Freq: Every day | INTRAMUSCULAR | Status: DC

## 2020-10-09 MED ORDER — ADULT MULTIVITAMIN W/MINERALS CH
1.0000 | ORAL_TABLET | Freq: Every day | ORAL | Status: DC
  Administered 2020-10-10: 1 via ORAL
  Filled 2020-10-09: qty 1

## 2020-10-09 MED ORDER — CARVEDILOL 25 MG PO TABS
25.0000 mg | ORAL_TABLET | Freq: Two times a day (BID) | ORAL | Status: DC
  Administered 2020-10-10 (×2): 25 mg via ORAL
  Filled 2020-10-09: qty 1
  Filled 2020-10-09: qty 2

## 2020-10-09 MED ORDER — ONDANSETRON HCL 4 MG PO TABS: 4.0000 mg | ORAL_TABLET | Freq: Four times a day (QID) | ORAL | Status: DC | PRN

## 2020-10-09 MED ORDER — DORZOLAMIDE HCL-TIMOLOL MAL 2-0.5 % OP SOLN
1.0000 [drp] | Freq: Two times a day (BID) | OPHTHALMIC | Status: DC
  Administered 2020-10-10: 1 [drp] via OPHTHALMIC
  Filled 2020-10-09: qty 10

## 2020-10-09 MED ORDER — ONDANSETRON HCL 4 MG/2ML IJ SOLN: 4.0000 mg | Freq: Four times a day (QID) | INTRAMUSCULAR | Status: DC | PRN

## 2020-10-09 MED ORDER — ALBUTEROL SULFATE (2.5 MG/3ML) 0.083% IN NEBU: 3.0000 mL | INHALATION_SOLUTION | RESPIRATORY_TRACT | Status: DC | PRN

## 2020-10-09 MED ORDER — LORAZEPAM 2 MG/ML IJ SOLN: 1.0000 mg | INTRAMUSCULAR | Status: DC | PRN

## 2020-10-09 MED ORDER — FOLIC ACID 1 MG PO TABS
1.0000 mg | ORAL_TABLET | Freq: Every day | ORAL | Status: DC
  Administered 2020-10-10: 1 mg via ORAL
  Filled 2020-10-09: qty 1

## 2020-10-09 MED ORDER — SODIUM CHLORIDE 0.9 % IV BOLUS
500.0000 mL | Freq: Once | INTRAVENOUS | Status: AC
  Administered 2020-10-09: 500 mL via INTRAVENOUS

## 2020-10-09 MED ORDER — PIPERACILLIN-TAZOBACTAM 3.375 G IVPB
3.3750 g | Freq: Three times a day (TID) | INTRAVENOUS | Status: DC
  Administered 2020-10-10 (×3): 3.375 g via INTRAVENOUS
  Filled 2020-10-09 (×5): qty 50

## 2020-10-09 NOTE — Progress Notes (Signed)
Pharmacy Antibiotic Note  Leonard [name] is a 70 y.o. male admitted on 10/09/2020 with  IAI .  Pharmacy has been consulted for zosyn dosing.  Plan: Zosyn 3.375g IV q8h (4 hour infusion). -Monitor renal function, clinical status, and antibiotic plan  Temp (24hrs), Avg:98.6 F (37 C), Min:98.6 F (37 C), Max:98.6 F (37 C)  Recent Labs  Lab 10/09/20 1525  WBC 13.8*  CREATININE 1.01    Estimated Creatinine Clearance: 63.6 mL/min (by C-G formula based on SCr of 1.01 mg/dL).    Allergies  Allergen Reactions   Losartan Rash and Other (See Comments)    Abdominal and leg rashes noted in 12/17    Antimicrobials this admission: Zosyn 9/1 >>   Microbiology results: 9/1 BCx:   Thank you for allowing pharmacy to be a part of this patient's care.  Joetta Manners, PharmD, [facility] Clinical Pharmacist ED RPh Phone: [place]: [phone]

## 2020-10-09 NOTE — H&P (Signed)
History and Physical    Brandley [name] [id number] DOB: 12-Jul-1950 DOA: 10/09/2020  PCP: Divit Holms, NP  Patient coming from: Home  I have personally briefly reviewed patient's old medical records in [place]  Chief Complaint: Abd pain  HPI: Leonard [name] is a 70 y.o. male with medical history significant of HTN, HFrEF (EF 30-35% as of 2019), ongoing EtOH abuse, prostate CA.  Pt presents to the ED with c/o epigastric pain, N/V.  Symptoms onset earlier this morning.  No prior h/o pancreatitis nor gallbladder problems.  Symptoms constant, severe, nothing makes better or worse.  No diarrhea, no constipation.  No fever.   ED Course: AST 489, ALT 119, T BILI 2.7.  Lipase nl  K 2.6, given 2 runs IV K  1L LR bolus  CT AP: 1) choledocholithiasis 2) mass like density of gallbladder fundus, could just be adenomyomatosis but underlying gallbladder neoplasm not excluded.  Rec MRCP.  EDP spoke with GI.  Pt started on empiric zosyn.  Hospitalist asked to admit.   Review of Systems: As per HPI, otherwise all review of systems negative.  Past Medical History:  Diagnosis Date   Asthma    CHF (congestive heart failure) ([place])    Hypertension    Prostate cancer [facility])     Past Surgical History:  Procedure Laterality Date   APPENDECTOMY     HYDROCELE EXCISION Left 12/01/2015   Procedure: REPAIR OF LEFT HYDROCELE;  Surgeon: Raynelle Bring, MD;  Location: [facility];  Service: Urology;  Laterality: Left;   LYMPHADENECTOMY Bilateral 09/24/2013   Procedure: LYMPHADENECTOMY;  Surgeon: Raynelle Bring, MD;  Location: [facility];  Service: Urology;  Laterality: Bilateral;   PROSTATE BIOPSY     RIGHT/LEFT HEART CATH AND CORONARY ANGIOGRAPHY N/A 06/10/2016   Procedure: Right/Left Heart Cath and Coronary Angiography;  Surgeon: Jettie Booze, MD;  Location: [place] [facility];  Service: Cardiovascular;  Laterality: N/A;   ROBOT ASSISTED LAPAROSCOPIC RADICAL PROSTATECTOMY N/A 09/24/2013    Procedure: ROBOTIC ASSISTED LAPAROSCOPIC RADICAL PROSTATECTOMY LEVEL 2;  Surgeon: Raynelle Bring, MD;  Location: [facility];  Service: Urology;  Laterality: N/A;     reports that he has been smoking cigarettes. He has a 7.50 pack-year smoking history. He has never used smokeless tobacco. He reports current alcohol use of about 6.0 - 7.0 standard drinks per week. He reports that he does not use drugs.  Allergies  Allergen Reactions   Losartan Rash and Other (See Comments)    Abdominal and leg rashes noted in 12/17    Family History  Problem Relation Age of Onset   Hypertension Sister    Cancer Neg Hx      Prior to Admission medications   Medication Sig Start Date End Date Taking? Authorizing Provider  albuterol (PROVENTIL HFA;VENTOLIN HFA) 108 (90 Base) MCG/ACT inhaler Inhale 2 puffs into the lungs every 4 (four) hours as needed for wheezing or shortness of breath. 08/10/17  Yes Horton, Barbette Hair, MD  albuterol (PROVENTIL) (2.5 MG/3ML) 0.083% nebulizer solution Take 3 mLs (2.5 mg total) by nebulization every 6 (six) hours as needed for wheezing or shortness of breath. 01/19/16  Yes Milagros Loll, MD  amLODipine (NORVASC) 5 MG tablet Take 1 tablet by mouth daily Patient taking differently: Take 5 mg by mouth daily. 04/25/18  Yes Imogene Burn, PA-C  carvedilol (COREG) 25 MG tablet Take 1 tablet (25 mg total) by mouth 2 (two) times daily. 09/25/19  Yes Jettie Booze, MD  dorzolamide-timolol (  COSOPT) 22.3-6.8 MG/ML ophthalmic solution Place 1 drop into both eyes 2 (two) times daily. 08/05/18  Yes [provider]  ENTRESTO 97-103 MG Take 1 tablet by mouth 2 (two) times daily. 10/04/18  Yes [provider]  furosemide (LASIX) 40 MG tablet Take 1 tablet (40 mg total) by mouth daily. You may take an extra tablet in the PM AS NEEDED for swelling up to twice a week 09/25/19  Yes Jettie Booze, MD  latanoprost (XALATAN) 0.005 % ophthalmic solution Place 1 drop into  both eyes at bedtime.  03/04/16  Yes [provider]  SYMBICORT 160-4.5 MCG/ACT inhaler Inhale 1 puff into the lungs daily as needed (shortness of breath). 12/08/17  Yes [provider]    Physical Exam: Vitals:   10/09/20 1945 10/09/20 2015 10/09/20 2030 10/09/20 2200  BP: (!) 145/77  (!) 154/98 (!) 152/83  Pulse:  71 73 68  Resp:  '20 19 17  '$ Temp:      TempSrc:      SpO2:  94% 91% 93%    Constitutional: NAD, calm, comfortable Eyes: PERRL, lids and conjunctivae normal ENMT: Mucous membranes are moist. Posterior pharynx clear of any exudate or lesions.Normal dentition.  Neck: normal, supple, no masses, no thyromegaly Respiratory: clear to auscultation bilaterally, no wheezing, no crackles. Normal respiratory effort. No accessory muscle use.  Cardiovascular: Regular rate and rhythm, no murmurs / rubs / gallops. No extremity edema. 2+ pedal pulses. No carotid bruits.  Abdomen: Epigastric TTP Musculoskeletal: no clubbing / cyanosis. No joint deformity upper and lower extremities. Good ROM, no contractures. Normal muscle tone.  Skin: no rashes, lesions, ulcers. No induration Neurologic: CN 2-12 grossly intact. Sensation intact, DTR normal. Strength 5/5 in all 4.  Psychiatric: Normal judgment and insight. Alert and oriented x 3. Normal mood.    Labs on Admission: I have personally reviewed following labs and imaging studies  CBC: Recent Labs  Lab 10/09/20 1525  WBC 13.8*  NEUTROABS 10.9*  HGB 13.7  HCT 39.1  MCV 98.2  PLT [id number]   Basic Metabolic Panel: Recent Labs  Lab 10/09/20 1525  NA 140  K 2.6*  CL 97*  CO2 32  GLUCOSE 126*  BUN 13  CREATININE 1.01  CALCIUM 9.3   GFR: Estimated Creatinine Clearance: 63.6 mL/min (by C-G formula based on SCr of 1.01 mg/dL). Liver Function Tests: Recent Labs  Lab 10/09/20 1525  AST 489*  ALT 119*  ALKPHOS 97  BILITOT 2.7*  PROT 6.5  ALBUMIN 3.4*   Recent Labs  Lab 10/09/20 1525  LIPASE 26   No results  for input(s): AMMONIA in the last 168 hours. Coagulation Profile: No results for input(s): INR, PROTIME in the last 168 hours. Cardiac Enzymes: No results for input(s): CKTOTAL, CKMB, CKMBINDEX, TROPONINI in the last 168 hours. BNP (last 3 results) No results for input(s): PROBNP in the last 8760 hours. HbA1C: No results for input(s): HGBA1C in the last 72 hours. CBG: No results for input(s): GLUCAP in the last 168 hours. Lipid Profile: No results for input(s): CHOL, HDL, LDLCALC, TRIG, CHOLHDL, LDLDIRECT in the last 72 hours. Thyroid Function Tests: No results for input(s): TSH, T4TOTAL, FREET4, T3FREE, THYROIDAB in the last 72 hours. Anemia Panel: No results for input(s): VITAMINB12, FOLATE, FERRITIN, TIBC, IRON, RETICCTPCT in the last 72 hours. Urine analysis:    Component Value Date/Time   COLORURINE AMBER (A) 10/09/2020 1525   APPEARANCEUR CLEAR 10/09/2020 1525   LABSPEC 1.014 10/09/2020 [place]  6.0 10/09/2020 1525   GLUCOSEU NEGATIVE 10/09/2020 1525   HGBUR MODERATE (A) 10/09/2020 1525   BILIRUBINUR NEGATIVE 10/09/2020 1525   KETONESUR NEGATIVE 10/09/2020 1525   PROTEINUR 30 (A) 10/09/2020 1525   UROBILINOGEN 1.0 11/28/2006 1206   NITRITE NEGATIVE 10/09/2020 [place] 10/09/2020 1525    Radiological Exams on Admission: CT ABDOMEN PELVIS W CONTRAST  Result Date: 10/09/2020 CLINICAL DATA:  70 year old male with history of abdominal distension. EXAM: CT ABDOMEN AND PELVIS WITH CONTRAST TECHNIQUE: Multidetector CT imaging of the abdomen and pelvis was performed using the standard protocol following bolus administration of intravenous contrast. CONTRAST:  1101m OMNIPAQUE IOHEXOL 350 MG/ML SOLN COMPARISON:  CT of the abdomen and pelvis 08/08/2013. FINDINGS: Lower chest: Atherosclerotic calcifications in the descending thoracic aorta as well as the left anterior descending coronary artery. Hepatobiliary: Subcentimeter low-attenuation lesion in segment 2 of  the liver, too small to characterize, but statistically likely represent a tiny cyst. No other larger more suspicious appearing hepatic lesions are noted. Mild intrahepatic and extrahepatic biliary ductal dilatation. Common bile duct measures up to 8 mm in the porta hepatis. In the distal common bile duct there are some subtle areas of increased density, best appreciated on coronal image 60 of series 6 and axial image 39 of series 3, suspicious for potential choledocholithiasis. Gallbladder is moderately distended. There is again focal soft tissue thickening and either soft tissue enhancement or faint soft tissue calcification in the fundus of the gallbladder (axial image 40 of series 3, coronal image 35 of series 6 and sagittal image 34 of series 7) measuring 3.4 x 1.9 x 4.0 cm which is slightly more prominent than prior examination from 2015. Pancreas: No pancreatic mass. No pancreatic ductal dilatation. No pancreatic or peripancreatic fluid collections or inflammatory changes. Spleen: Unremarkable. Adrenals/Urinary Tract: Several subcentimeter low-attenuation lesions in both kidneys, too small to characterize, but statistically likely represent cysts. Left-sided extrarenal pelvis (normal anatomical variant), similar to the prior study. No hydroureteronephrosis. Urinary bladder is normal in appearance. Bilateral adrenal glands are normal in appearance. Stomach/Bowel: The appearance of the stomach is normal. There is no pathologic dilatation of small bowel or colon. The appendix is not confidently identified and may be surgically absent. Regardless, there are no inflammatory changes noted adjacent to the cecum to suggest the presence of an acute appendicitis at this time. Vascular/Lymphatic: Atherosclerotic calcifications in the abdominal aorta and pelvic vasculature, without evidence of aneurysm or dissection. No lymphadenopathy noted in the abdomen or pelvis. Reproductive: Status post radical prostatectomy.  Seminal vesicles are unremarkable in appearance. Other: No significant volume of ascites.  No pneumoperitoneum. Musculoskeletal: Prominent Schmorl's nodes in the superior aspect of the L5 and S1 vertebral bodies. There are no aggressive appearing lytic or blastic lesions noted in the visualized portions of the skeleton. IMPRESSION: 1. Findings are suggestive of obstructive choledocholithiasis with mild intra and extrahepatic biliary ductal dilatation and moderate gallbladder distension. There is also an increasing mass-like density in the fundus of the gallbladder which may simply represent adenomyomatosis, however, underlying gallbladder neoplasm is difficult to entirely exclude. Further evaluation with abdominal MRI with and without IV gadolinium with MRCP is recommended at this time. 2. Aortic atherosclerosis, in addition to at least left anterior descending coronary artery disease. Assessment for potential risk factor modification, dietary therapy or pharmacologic therapy may be warranted, if clinically indicated. 3. Additional incidental findings, as above. Electronically Signed   By: DVinnie LangtonM.D.   On: 10/09/2020 18:29  EKG: Independently reviewed.  Assessment/Plan Principal Problem:   Choledocholithiasis with obstruction Active Problems:   Essential hypertension   Nonischemic cardiomyopathy (HCC)   Alcohol abuse   Gallbladder mass   Chronic HFrEF (heart failure with reduced ejection fraction) (HCC)    Choledocholithiasis with obstruction - Also gallbladder mass-like density, might just be adenomyomatosis though EDP spoke with GI Looks like Dr. Lyndel Safe already ordered MRCP on the patient for further evaluation of choledocholithiasis and mass-like density Empiric zosyn NPO IVF: 1L bolus and LR at 125 Suspect pt may need ERCP, ultimately cholecystectomy depending on what MRCP demonstrates. Dilaudid PRN pain Zofran PRN nausea Repeat CMP in AM Hypokalemia - Replace Check Mg,  replace if needed Repeat CMP in AM EtOH abuse - CIWA HTN - Cont home BP meds HFrEF from [facility] - Looks like pt due for updated 2d echo anyhow (cards just ordered this yesterday) Will go ahead and order for inpatient since I suspect hes in for surgical procedure(s) (ERCP + cholecystectomy in best case scenario) in immediate future. Hold lasix (takes PRN anyhow) Cont entresto Cont Coreg  DVT prophylaxis: SCDs Code Status: Full Family Communication: No family in room Disposition Plan: Home after GI work up, possibly needing gen surg too Consults called: Dr. Lyndel Safe Admission status: Admit to inpatient  Severity of Illness: The appropriate patient status for this patient is INPATIENT. Inpatient status is judged to be reasonable and necessary in order to provide the required intensity of service to ensure the patient's safety. The patient's presenting symptoms, physical exam findings, and initial radiographic and laboratory data in the context of their chronic comorbidities is felt to place them at high risk for further clinical deterioration. Furthermore, it is not anticipated that the patient will be medically stable for discharge from the hospital within 2 midnights of admission. The following factors support the patient status of inpatient.   IP status for choledocholithiasis with CBD obstruction, LFT elevation.   * I certify that at the point of admission it is my clinical judgment that the patient will require inpatient hospital care spanning beyond 2 midnights from the point of admission due to high intensity of service, high risk for further deterioration and high frequency of surveillance required.*   Shomari Scicchitano M. DO [facility]  How to contact the [facility] Attending or Consulting provider [place] or covering provider during after hours [place], for this patient?  Check the care team in [facility] and look for a) attending/consulting [facility] provider listed and b) the [facility] team listed Log  into [email or web address]  Amion Physician Scheduling and messaging for groups and whole hospitals  On call and physician scheduling software for group practices, residents, hospitalists and other medical providers for call, clinic, rotation and shift schedules. On[employer] is a hospital-wide system for scheduling doctors and paging doctors on call. EasyPlot is for scientific plotting and data analysis.  [email or web address]  and use [place] [facility]'s universal password to access. If you do not have the password, please contact the hospital operator.  Locate the [facility] provider you are looking for under [facility] and page to a number that you can be directly reached. If you still have difficulty reaching the provider, please page the [facility] (Director on Call) for the Hospitalists listed on amion for assistance.  10/09/2020, 10:54 PM

## 2020-10-09 NOTE — ED Provider Notes (Signed)
Emergency Medicine Provider Triage Evaluation Note  Leonard [name] , a 70 y.o. male  was evaluated in triage.  Pt complains of epigastric abdominal pain that started earlier this morning associated with distention.  Patient admits to daily alcohol use.  No history of pancreatitis.  He admits to vomiting however, denies diarrhea.  No fever or chills.  No previous abdominal operations..  Review of Systems  Positive: Abdominal pain Negative: diarrhea  Physical Exam  BP (!) 158/90 (BP Location: Left Arm)   Pulse 79   Temp 98.6 F (37 C) (Oral)   Resp 14   SpO2 97%  Gen:   Awake, no distress   Resp:  Normal effort  MSK:   Moves extremities without difficulty  Other:  TTP in epigastric region  Medical Decision Making  Medically screening exam initiated at 3:25 PM.  Appropriate orders placed.  Leonard [name] was informed that the remainder of the evaluation will be completed by another provider, this initial triage assessment does not replace that evaluation, and the importance of remaining in the ED until their evaluation is complete.  Abdominal labs CT abdomen   Leonard [name] 10/09/20 1526    Leonard Muskrat, MD 10/09/20 [phone]

## 2020-10-09 NOTE — ED Triage Notes (Signed)
Patient BIB [facility] for complaint of abdominal pain that started this morning. Abdomen distended, reports history of daily ETOH intake for the last twenty years. Reports vomiting, denies diarrhea and constipation.

## 2020-10-09 NOTE — ED Provider Notes (Signed)
[facility] EMERGENCY DEPARTMENT Provider Note   CSN: [id number] Arrival date & time: 10/09/20  1514     History Chief Complaint  Patient presents with   Abdominal Pain    Leonard [name] is a 70 y.o. male.  HPI  70 year old male with past medical history of HTN, CHF, daily alcohol abuse presents the emergency department with epigastric discomfort and nausea/vomiting.  Patient states that this started earlier this morning.  Denies any history of pancreatitis or gallbladder problems.  He has been having normal bowel movements, denies any diarrhea or constipation.  Denies fever or previous abdominal surgeries.  Past Medical History:  Diagnosis Date   Asthma    CHF (congestive heart failure) ([place])    Hypertension    Prostate cancer [facility])     Patient Active Problem List   Diagnosis Date Noted   AKI (acute kidney injury) ([place]) 10/27/2018   Hypotension 10/27/2018   Alcohol abuse 12/28/2017   Glaucoma 12/28/2017   Nonischemic cardiomyopathy ([place]) 10/17/2017   History of pulmonary embolus (PE) 10/17/2017   Pulmonary embolism ([place]) 02/28/2017   Acute exacerbation of congestive heart failure ([place]) 02/24/2017   CHF exacerbation ([place]) 02/24/2017   Hypertensive urgency 02/24/2017   Pulmonary edema 02/24/2017   Acute kidney injury superimposed on chronic kidney disease ([place]) 02/24/2017   Hypertensive heart disease 09/27/2016   Tobacco abuse 09/27/2016   Mixed hyperlipidemia 09/27/2016   History of tobacco abuse 06/25/2016   Allergic sinusitis 04/17/2016   Asthma 01/19/2016   CKD (chronic kidney disease) stage 2, GFR 60-89 ml/min 01/19/2016   Aspiration pneumonitis ([place])    Essential hypertension    Heart failure with reduced ejection fraction ([place]) 01/10/2016   Malignant neoplasm of prostate ([place]) 07/03/2013    Past Surgical History:  Procedure Laterality Date   APPENDECTOMY     HYDROCELE EXCISION Left 12/01/2015   Procedure: REPAIR OF LEFT HYDROCELE;   Surgeon: Raynelle Bring, MD;  Location: [facility];  Service: Urology;  Laterality: Left;   LYMPHADENECTOMY Bilateral 09/24/2013   Procedure: LYMPHADENECTOMY;  Surgeon: Raynelle Bring, MD;  Location: [facility];  Service: Urology;  Laterality: Bilateral;   PROSTATE BIOPSY     RIGHT/LEFT HEART CATH AND CORONARY ANGIOGRAPHY N/A 06/10/2016   Procedure: Right/Left Heart Cath and Coronary Angiography;  Surgeon: Jettie Booze, MD;  Location: [place] [facility];  Service: Cardiovascular;  Laterality: N/A;   ROBOT ASSISTED LAPAROSCOPIC RADICAL PROSTATECTOMY N/A 09/24/2013   Procedure: ROBOTIC ASSISTED LAPAROSCOPIC RADICAL PROSTATECTOMY LEVEL 2;  Surgeon: Raynelle Bring, MD;  Location: [facility];  Service: Urology;  Laterality: N/A;       Family History  Problem Relation Age of Onset   Hypertension Sister    Cancer Neg Hx     Social History   Tobacco Use   Smoking status: Every Day    Packs/day: 0.25    Years: 30.00    Pack years: 7.50    Types: Cigarettes    Last attempt to quit: 12/24/2015    Years since quitting: 4.7   Smokeless tobacco: Never   Tobacco comments:    STtses he has cut down from 2 PPD to 4 cigarettes per day  Vaping Use   Vaping Use: Never used  Substance Use Topics   Alcohol use: Yes    Alcohol/week: 6.0 - 7.0 standard drinks    Types: 6 - 7 Shots of liquor per week    Comment: states 6-7 shots of liquor daily   Drug use: No  Types: Marijuana    Home Medications Prior to Admission medications   Medication Sig Start Date End Date Taking? Authorizing Provider  albuterol (PROVENTIL HFA;VENTOLIN HFA) 108 (90 Base) MCG/ACT inhaler Inhale 2 puffs into the lungs every 4 (four) hours as needed for wheezing or shortness of breath. 08/10/17  Yes Deepak Bless, Barbette Hair, MD  albuterol (PROVENTIL) (2.5 MG/3ML) 0.083% nebulizer solution Take 3 mLs (2.5 mg total) by nebulization every 6 (six) hours as needed for wheezing or shortness of breath. 01/19/16  Yes Milagros Loll, MD   amLODipine (NORVASC) 5 MG tablet Take 1 tablet by mouth daily Patient taking differently: Take 5 mg by mouth daily. 04/25/18  Yes Imogene Burn, PA-C  carvedilol (COREG) 25 MG tablet Take 1 tablet (25 mg total) by mouth 2 (two) times daily. 09/25/19  Yes Jettie Booze, MD  dorzolamide-timolol (COSOPT) 22.3-6.8 MG/ML ophthalmic solution Place 1 drop into both eyes 2 (two) times daily. 08/05/18  Yes [provider]  ENTRESTO 97-103 MG Take 1 tablet by mouth 2 (two) times daily. 10/04/18  Yes [provider]  furosemide (LASIX) 40 MG tablet Take 1 tablet (40 mg total) by mouth daily. You may take an extra tablet in the PM AS NEEDED for swelling up to twice a week 09/25/19  Yes Jettie Booze, MD  latanoprost (XALATAN) 0.005 % ophthalmic solution Place 1 drop into both eyes at bedtime.  03/04/16  Yes [provider]  SYMBICORT 160-4.5 MCG/ACT inhaler Inhale 1 puff into the lungs daily as needed (shortness of breath). 12/08/17  Yes [provider]  colchicine 0.6 MG tablet Take 1 tablet (0.6 mg total) by mouth 2 (two) times daily. Patient not taking: Reported on 10/09/2020 09/25/20   Landis Martins, DPM  predniSONE (STERAPRED UNI-PAK 21 TAB) 10 MG (21) TBPK tablet Take as directed 10/02/20   Landis Martins, DPM    Allergies    Losartan  Review of Systems   Review of Systems  Constitutional:  Positive for fatigue. Negative for chills and fever.  HENT:  Negative for congestion.   Eyes:  Negative for visual disturbance.  Respiratory:  Negative for shortness of breath.   Cardiovascular:  Negative for chest pain.  Gastrointestinal:  Positive for abdominal pain, nausea and vomiting. Negative for blood in stool and diarrhea.  Genitourinary:  Negative for dysuria.  Musculoskeletal:  Negative for back pain.  Skin:  Negative for rash.  Neurological:  Negative for headaches.   Physical Exam Updated Vital Signs BP (!) 169/96   Pulse 73   Temp 98.6 F (37  C) (Oral)   Resp 16   SpO2 90%   Physical Exam Vitals and nursing note reviewed.  Constitutional:      General: He is not in acute distress.    Appearance: Normal appearance.  HENT:     Head: Normocephalic.     Mouth/Throat:     Mouth: Mucous membranes are moist.  Cardiovascular:     Rate and Rhythm: Normal rate.  Pulmonary:     Effort: Pulmonary effort is normal. No respiratory distress.  Abdominal:     Palpations: Abdomen is soft.     Tenderness: There is abdominal tenderness in the epigastric area. There is no guarding or rebound.  Skin:    General: Skin is warm.  Neurological:     Mental Status: He is alert and oriented to person, place, and time. Mental status is at baseline.  Psychiatric:        Mood  and Affect: Mood normal.    ED Results / Procedures / Treatments   Labs (all labs ordered are listed, but only abnormal results are displayed) Labs Reviewed  CBC WITH DIFFERENTIAL/PLATELET - Abnormal; Notable for the following components:      Result Value   WBC 13.8 (*)    RBC 3.98 (*)    MCH 34.4 (*)    Neutro Abs 10.9 (*)    All other components within normal limits  COMPREHENSIVE METABOLIC PANEL - Abnormal; Notable for the following components:   Potassium 2.6 (*)    Chloride 97 (*)    Glucose, Bld 126 (*)    Albumin 3.4 (*)    AST 489 (*)    ALT 119 (*)    Total Bilirubin 2.7 (*)    All other components within normal limits  URINALYSIS, ROUTINE W REFLEX MICROSCOPIC - Abnormal; Notable for the following components:   Color, Urine AMBER (*)    Hgb urine dipstick MODERATE (*)    Protein, ur 30 (*)    All other components within normal limits  LIPASE, BLOOD    EKG None  Radiology No results found.  Procedures Procedures   Medications Ordered in ED Medications  sodium chloride 0.9 % bolus 500 mL (500 mLs Intravenous New Bag/Given 10/09/20 1719)  HYDROmorphone (DILAUDID) injection 1 mg (1 mg Intravenous Given 10/09/20 1719)  ondansetron (ZOFRAN)  injection 4 mg (4 mg Intravenous Given 10/09/20 1719)    ED Course  I have reviewed the triage vital signs and the nursing notes.  Pertinent labs & imaging results that were available during my care of the patient were reviewed by me and considered in my medical decision making (see chart for details).    MDM Rules/Calculators/A&P                           70 year old male presents the emergency department with epigastric abdominal pain and nausea/vomiting.  Work-up is significant for leukocytosis, hypokalemia and transaminitis with elevated bilirubin of 2.7.  Lipase is normal.  CAT scan of the abdomen pelvis shows obstructive choledocholithiasis with a distended gallbladder and possible gallbladder mass.  Spoke with on-call GI, Dr. Lyndel Safe, he recommends blood cultures, IV antibiotics and will evaluate the patient in the morning for further treatment and MRCP.  Potassium has been replaced.  Currently pain is controlled and IV antibiotics has been started.  Patient will be admitted medically.  Patients evaluation and results requires admission for further treatment and care. Patient agrees with admission plan, offers no new complaints and is stable/unchanged at time of admit.  Final Clinical Impression(s) / ED Diagnoses Final diagnoses:  None    Rx / DC Orders ED Discharge Orders     None        Lorelle Gibbs, DO 10/10/20 0022

## 2020-10-09 NOTE — ED Notes (Signed)
Patient transported to CT 

## 2020-10-10 ENCOUNTER — Inpatient Hospital Stay (HOSPITAL_COMMUNITY): Payer: Medicare Other

## 2020-10-10 ENCOUNTER — Encounter (HOSPITAL_COMMUNITY): Payer: Self-pay | Admitting: Internal Medicine

## 2020-10-10 ENCOUNTER — Other Ambulatory Visit: Payer: Self-pay

## 2020-10-10 DIAGNOSIS — Z01810 Encounter for preprocedural cardiovascular examination: Secondary | ICD-10-CM

## 2020-10-10 DIAGNOSIS — I428 Other cardiomyopathies: Secondary | ICD-10-CM

## 2020-10-10 DIAGNOSIS — F1010 Alcohol abuse, uncomplicated: Secondary | ICD-10-CM

## 2020-10-10 DIAGNOSIS — I5022 Chronic systolic (congestive) heart failure: Secondary | ICD-10-CM

## 2020-10-10 DIAGNOSIS — I2510 Atherosclerotic heart disease of native coronary artery without angina pectoris: Secondary | ICD-10-CM

## 2020-10-10 LAB — CBC
HCT: 35 % — ABNORMAL LOW (ref 39.0–52.0)
Hemoglobin: 12 g/dL — ABNORMAL LOW (ref 13.0–17.0)
MCH: 33.8 pg (ref 26.0–34.0)
MCHC: 34.3 g/dL (ref 30.0–36.0)
MCV: 98.6 fL (ref 80.0–100.0)
Platelets: 196 10*3/uL (ref 150–400)
RBC: 3.55 MIL/uL — ABNORMAL LOW (ref 4.22–5.81)
RDW: 12.9 % (ref 11.5–15.5)
WBC: 8.9 10*3/uL (ref 4.0–10.5)
nRBC: 0 % (ref 0.0–0.2)

## 2020-10-10 LAB — ECHOCARDIOGRAM COMPLETE
Area-P 1/2: 3.85 cm2
P 1/2 time: 713 msec
S' Lateral: 3 cm

## 2020-10-10 LAB — COMPREHENSIVE METABOLIC PANEL
ALT: 164 U/L — ABNORMAL HIGH (ref 0–44)
AST: 339 U/L — ABNORMAL HIGH (ref 15–41)
Albumin: 2.6 g/dL — ABNORMAL LOW (ref 3.5–5.0)
Alkaline Phosphatase: 96 U/L (ref 38–126)
Anion gap: 7 (ref 5–15)
BUN: 9 mg/dL (ref 8–23)
CO2: 28 mmol/L (ref 22–32)
Calcium: 8.4 mg/dL — ABNORMAL LOW (ref 8.9–10.3)
Chloride: 103 mmol/L (ref 98–111)
Creatinine, Ser: 0.88 mg/dL (ref 0.61–1.24)
GFR, Estimated: >60 mL/min (ref >60)
Glucose, Bld: 84 mg/dL (ref 70–99)
Potassium: 3.5 mmol/L (ref 3.5–5.1)
Sodium: 138 mmol/L (ref 135–145)
Total Bilirubin: 4.5 mg/dL — ABNORMAL HIGH (ref 0.3–1.2)
Total Protein: 5.2 g/dL — ABNORMAL LOW (ref 6.5–8.1)

## 2020-10-10 LAB — PROTIME-INR
INR: 1 (ref 0.8–1.2)
Prothrombin Time: 13 seconds (ref 11.4–15.2)

## 2020-10-10 LAB — RESP PANEL BY RT-PCR (FLU A&B, COVID) ARPGX2
Influenza A by PCR: NEGATIVE
Influenza B by PCR: NEGATIVE
SARS Coronavirus 2 by RT PCR: NEGATIVE

## 2020-10-10 LAB — HIV ANTIBODY (ROUTINE TESTING W REFLEX): HIV Screen 4th Generation wRfx: NONREACTIVE

## 2020-10-10 LAB — MAGNESIUM: Magnesium: 1.8 mg/dL (ref 1.7–2.4)

## 2020-10-10 LAB — PHOSPHORUS: Phosphorus: 3.5 mg/dL (ref 2.5–4.6)

## 2020-10-10 MED ORDER — GADOBUTROL 1 MMOL/ML IV SOLN
8.0000 mL | Freq: Once | INTRAVENOUS | Status: AC | PRN
  Administered 2020-10-10: 8 mL via INTRAVENOUS

## 2020-10-10 MED ORDER — POTASSIUM CHLORIDE CRYS ER 20 MEQ PO TBCR
40.0000 meq | EXTENDED_RELEASE_TABLET | Freq: Every day | ORAL | Status: DC
  Filled 2020-10-10: qty 2

## 2020-10-10 MED ORDER — FUROSEMIDE 40 MG PO TABS
40.0000 mg | ORAL_TABLET | Freq: Every day | ORAL | Status: DC
  Administered 2020-10-10: 40 mg via ORAL
  Filled 2020-10-10: qty 1

## 2020-10-10 NOTE — Discharge Summary (Signed)
[place]  Physician Discharge Summary  Leonard [name] [id number] DOB: 1950-10-04 DOA: 10/09/2020  PCP: Hades Holms, NP  Admit date: 10/09/2020 Discharge date: 10/10/2020  Admitted From: Home Disposition: LEFT AGAINST MEDICAL ADVICE  Recommendations for Outpatient Follow-up:  Cholecystectomy   Discharge Condition: Guarded CODE STATUS: Full code  Brief/Interim Summary:  Admission HPI written by Etta Quill, DO   HPI: Leonard [name] is a 70 y.o. male with medical history significant of HTN, HFrEF (EF 30-35% as of 2019), ongoing EtOH abuse, prostate CA.   Pt presents to the ED with c/o epigastric pain, N/V.  Symptoms onset earlier this morning.  No prior h/o pancreatitis nor gallbladder problems.  Symptoms constant, severe, nothing makes better or worse.   No diarrhea, no constipation.   No fever.   Hospital course:  Patient was admitted with evidence of choledocholithiasis and associated obstruction on CT abdomen/pelvis.  Patient was started on empiric Zosyn IV.  subsequent MRCP showed evidence for likely passed gallstone but did show evidence of likely early cholecystitis.  GI was consulted with recommendation for no endoscopic evaluation since there was evidence for passed stone.  General surgery was consulted for consideration of laparoscopic cholecystectomy.  Cardiology was consulted for surgical clearance in setting of significant cardiomyopathy.  General surgery had an in-depth discussion with regard to risks and benefits of performing a laparoscopic cholecystectomy including but not limited to severe infection and death.  Later in the evening, nursing attempted to give patient medication and was not found him to be out of the room with IV and down in the bed.  Patient appears to have left AGAINST MEDICAL ADVICE.  He has been sufficiently counseled on the risks of not having the cholecystectomy and at the time of my interview had capacity to make  decisions.  From the beginning, patient was not interested in having surgery.  Discharge Diagnoses:  Principal Problem:   Choledocholithiasis with obstruction Active Problems:   Essential hypertension   Nonischemic cardiomyopathy (HCC)   Alcohol abuse   Gallbladder mass   Chronic HFrEF (heart failure with reduced ejection fraction) ([facility])    Discharge Instructions   Allergies as of 10/10/2020       Reactions   Losartan Rash, Other (See Comments)   Abdominal and leg rashes noted in 12/17        Medication List     ASK your doctor about these medications    albuterol (2.5 MG/3ML) 0.083% nebulizer solution Commonly known as: PROVENTIL Take 3 mLs (2.5 mg total) by nebulization every 6 (six) hours as needed for wheezing or shortness of breath.   albuterol 108 (90 Base) MCG/ACT inhaler Commonly known as: VENTOLIN HFA Inhale 2 puffs into the lungs every 4 (four) hours as needed for wheezing or shortness of breath.   amLODipine 5 MG tablet Commonly known as: NORVASC Take 1 tablet by mouth daily   carvedilol 25 MG tablet Commonly known as: COREG Take 1 tablet (25 mg total) by mouth 2 (two) times daily.   dorzolamide-timolol 22.3-6.8 MG/ML ophthalmic solution Commonly known as: COSOPT Place 1 drop into both eyes 2 (two) times daily.   Entresto 97-103 MG Generic drug: sacubitril-valsartan Take 1 tablet by mouth 2 (two) times daily.   furosemide 40 MG tablet Commonly known as: LASIX Take 1 tablet (40 mg total) by mouth daily. You may take an extra tablet in the PM AS NEEDED for swelling up to twice a week   latanoprost 0.005 %  ophthalmic solution Commonly known as: XALATAN Place 1 drop into both eyes at bedtime.   Symbicort 160-4.5 MCG/ACT inhaler Generic drug: budesonide-formoterol Inhale 1 puff into the lungs daily as needed (shortness of breath).        Follow-up Information     [facility] Follow up.   Specialty: Cardiology Why: [facility] - The cardiology office has cancelled your 11/03/20 echocardiogram (ultrasound) since you had this test done in the hospital. We also do not feel you need the 10/29/20 CT scan since you had your aorta size evaluated by the heart ultrasound here in the hospital. You will still need to follow up with your primary care doctor, though, to evaluate the soft tissue mass on your chest wall. We will still keep your follow-up visit in October as scheduled. Contact information: [address], [place] [address] [phone]               Allergies  Allergen Reactions   Losartan Rash and Other (See Comments)    Abdominal and leg rashes noted in 12/17    Consultations: [facility] General surgery Cardiology   Procedures/Studies: CT ABDOMEN PELVIS W CONTRAST  Result Date: 10/09/2020 CLINICAL DATA:  70 year old male with history of abdominal distension. EXAM: CT ABDOMEN AND PELVIS WITH CONTRAST TECHNIQUE: Multidetector CT imaging of the abdomen and pelvis was performed using the standard protocol following bolus administration of intravenous contrast. CONTRAST:  143m OMNIPAQUE IOHEXOL 350 MG/ML SOLN COMPARISON:  CT of the abdomen and pelvis 08/08/2013. FINDINGS: Lower chest: Atherosclerotic calcifications in the descending thoracic aorta as well as the left anterior descending coronary artery. Hepatobiliary: Subcentimeter low-attenuation lesion in segment 2 of the liver, too small to characterize, but statistically likely represent a tiny cyst. No other larger more suspicious appearing hepatic lesions are noted. Mild intrahepatic and extrahepatic biliary ductal dilatation. Common bile duct measures up to 8 mm in the porta hepatis. In the distal common bile duct there are some subtle areas of increased density, best appreciated on coronal image 60 of series 6 and axial image 39 of series 3, suspicious for potential choledocholithiasis. Gallbladder is  moderately distended. There is again focal soft tissue thickening and either soft tissue enhancement or faint soft tissue calcification in the fundus of the gallbladder (axial image 40 of series 3, coronal image 35 of series 6 and sagittal image 34 of series 7) measuring 3.4 x 1.9 x 4.0 cm which is slightly more prominent than prior examination from 2015. Pancreas: No pancreatic mass. No pancreatic ductal dilatation. No pancreatic or peripancreatic fluid collections or inflammatory changes. Spleen: Unremarkable. Adrenals/Urinary Tract: Several subcentimeter low-attenuation lesions in both kidneys, too small to characterize, but statistically likely represent cysts. Left-sided extrarenal pelvis (normal anatomical variant), similar to the prior study. No hydroureteronephrosis. Urinary bladder is normal in appearance. Bilateral adrenal glands are normal in appearance. Stomach/Bowel: The appearance of the stomach is normal. There is no pathologic dilatation of small bowel or colon. The appendix is not confidently identified and may be surgically absent. Regardless, there are no inflammatory changes noted adjacent to the cecum to suggest the presence of an acute appendicitis at this time. Vascular/Lymphatic: Atherosclerotic calcifications in the abdominal aorta and pelvic vasculature, without evidence of aneurysm or dissection. No lymphadenopathy noted in the abdomen or pelvis. Reproductive: Status post radical prostatectomy. Seminal vesicles are unremarkable in appearance. Other: No significant volume of ascites.  No pneumoperitoneum. Musculoskeletal: Prominent Schmorl's nodes in the superior aspect  of the L5 and S1 vertebral bodies. There are no aggressive appearing lytic or blastic lesions noted in the visualized portions of the skeleton. IMPRESSION: 1. Findings are suggestive of obstructive choledocholithiasis with mild intra and extrahepatic biliary ductal dilatation and moderate gallbladder distension. There is  also an increasing mass-like density in the fundus of the gallbladder which may simply represent adenomyomatosis, however, underlying gallbladder neoplasm is difficult to entirely exclude. Further evaluation with abdominal MRI with and without IV gadolinium with MRCP is recommended at this time. 2. Aortic atherosclerosis, in addition to at least left anterior descending coronary artery disease. Assessment for potential risk factor modification, dietary therapy or pharmacologic therapy may be warranted, if clinically indicated. 3. Additional incidental findings, as above. Electronically Signed   By: Vinnie Langton M.D.   On: 10/09/2020 18:29   DG Foot Complete Left  Result Date: 09/26/2020 Please see detailed radiograph report in office note.  DG Foot Complete Right  Result Date: 09/26/2020 Please see detailed radiograph report in office note.  MR ABDOMEN MRCP W WO CONTAST  Result Date: 10/10/2020 CLINICAL DATA:  Epigastric pain, nausea/vomiting, ETOH, jaundice EXAM: MRI ABDOMEN WITHOUT AND WITH CONTRAST (INCLUDING MRCP) TECHNIQUE: Multiplanar multisequence MR imaging of the abdomen was performed both before and after the administration of intravenous contrast. Heavily T2-weighted images of the biliary and pancreatic ducts were obtained, and three-dimensional MRCP images were rendered by post processing. CONTRAST:  68m GADAVIST GADOBUTROL 1 MMOL/ML IV SOLN COMPARISON:  CT abdomen/pelvis dated 10/09/2020 and 08/08/2013. FINDINGS: Motion degraded images. Lower chest: Lung bases are clear. Hepatobiliary: Subcentimeter cyst in the left hepatic lobe (series 8/image 9). No suspicious/enhancing hepatic lesions, noting motion degradation. Layering gallstones with mild gallbladder wall edema and gallbladder distension. Early acute cholecystitis is possible, although this is equivocal. Nodular thickening of the gallbladder fundus (series 4/image 22) is chronic dating back to 2015 and favors benign gallbladder  adenomyomatosis. Mild central intrahepatic ductal prominence, improved from recent CT. Common duct measures 8 mm, within normal limits, tapering at the ampulla. Despite the CT appearance, there is no evidence of choledocholithiasis on MR. Pancreas:  Within normal limits. Spleen:  Within normal limits. Adrenals/Urinary Tract:  Adrenal glands are within normal limits. Bilateral renal cysts, measuring up to 11 mm in the left lower kidney (series 19/image 13). No hydronephrosis. Stomach/Bowel: Stomach is within normal limits. Visualized bowel is unremarkable. Vascular/Lymphatic:  No evidence of abdominal aortic aneurysm. No suspicious abdominal lymphadenopathy. Other:  No abdominal ascites. Musculoskeletal: No focal osseous lesions. IMPRESSION: Cholelithiasis with mild gallbladder wall thickening/edema and gallbladder distension. Early acute cholecystitis is possible, although equivocal. Consider hepatobiliary nuclear medicine scan as clinically warranted. Common duct measures 8 mm, within normal limits, tapering at the ampulla. No choledocholithiasis is evident on MR. Given the CT appearance, recent passage of a tiny distal CBD stone is possible. Nodular thickening of the gallbladder fundus is chronic dating back to 2015, favoring benign gallbladder adenomyomatosis. Electronically Signed   By: SJulian HyM.D.   On: 10/10/2020 01:18   ECHOCARDIOGRAM COMPLETE  Result Date: 10/10/2020    ECHOCARDIOGRAM REPORT   Patient Name:   Leonard ANTONIEWICZDate of Exam: 10/10/2020 Medical Rec #:  [id number]     Height:       67.0 in Accession #:    [id number]    Weight:       173.8 lb Date of Birth:  [birth year]     BSA:          1.905  m Patient Age:    1 years       BP:           142/78 mmHg Patient Gender: M              HR:           76 bpm. Exam Location:  Inpatient Procedure: 2D Echo, Cardiac Doppler and Color Doppler Indications:    Cardiomyopathy-Unspecified I42.9  History:        Patient has prior history of  Echocardiogram examinations, most                 recent 02/24/2017. CHF; Risk Factors:Hypertension.  Sonographer:    Bernadene Person RDCS Referring Phys: [place]  1. Left ventricular ejection fraction, by estimation, is 60 to 65%. The left ventricle has normal function. The left ventricle has no regional wall motion abnormalities. Left ventricular diastolic parameters are consistent with Grade I diastolic dysfunction (impaired relaxation).  2. Right ventricular systolic function is normal. The right ventricular size is normal.  3. The mitral valve is grossly normal. Mild mitral valve regurgitation. No evidence of mitral stenosis.  4. The aortic valve was not well visualized. Aortic valve regurgitation is mild. No aortic stenosis is present.  5. Aortic dilatation noted. There is mild dilatation of the ascending aorta, measuring 40 mm.  6. The inferior vena cava is normal in size with greater than 50% respiratory variability, suggesting right atrial pressure of 3 mmHg. Comparison(s): A prior study was performed on 02/24/17. LVEF has improved. FINDINGS  Left Ventricle: Left ventricular ejection fraction, by estimation, is 60 to 65%. The left ventricle has normal function. The left ventricle has no regional wall motion abnormalities. The left ventricular internal cavity size was normal in size. There is  no left ventricular hypertrophy. Left ventricular diastolic parameters are consistent with Grade I diastolic dysfunction (impaired relaxation). Right Ventricle: The right ventricular size is normal. No increase in right ventricular wall thickness. Right ventricular systolic function is normal. Left Atrium: Left atrial size was normal in size. Right Atrium: Right atrial size was normal in size. Pericardium: There is no evidence of pericardial effusion. Mitral Valve: The mitral valve is grossly normal. Mild mitral valve regurgitation. No evidence of mitral valve stenosis. Tricuspid Valve: The  tricuspid valve is normal in structure. Tricuspid valve regurgitation is not demonstrated. Aortic Valve: The aortic valve was not well visualized. Aortic valve regurgitation is mild. Aortic regurgitation PHT measures 713 msec. No aortic stenosis is present. Pulmonic Valve: The pulmonic valve was not well visualized. Pulmonic valve regurgitation is not visualized. No evidence of pulmonic stenosis. Aorta: The aortic root is normal in size and structure and aortic dilatation noted. There is mild dilatation of the ascending aorta, measuring 40 mm. Venous: The inferior vena cava is normal in size with greater than 50% respiratory variability, suggesting right atrial pressure of 3 mmHg. IAS/Shunts: The atrial septum is grossly normal.  LEFT VENTRICLE PLAX 2D LVIDd:         4.70 cm  Diastology LVIDs:         3.00 cm  LV e' medial:    8.31 cm/s LV PW:         1.00 cm  LV E/e' medial:  13.4 LV IVS:        0.90 cm  LV e' lateral:   9.43 cm/s LVOT diam:     2.00 cm  LV E/e' lateral: 11.8 LV SV:  73 LV SV Index:   38 LVOT Area:     3.14 cm  RIGHT VENTRICLE RV S prime:     10.10 cm/s TAPSE (M-mode): 2.5 cm LEFT ATRIUM             Index       RIGHT ATRIUM           Index LA diam:        4.20 cm 2.20 cm/m  RA Area:     13.00 cm LA Vol (A2C):   38.3 ml 20.10 ml/m RA Volume:   27.90 ml  14.64 ml/m LA Vol (A4C):   33.0 ml 17.32 ml/m LA Biplane Vol: 37.8 ml 19.84 ml/m  AORTIC VALVE LVOT Vmax:   105.00 cm/s LVOT Vmean:  65.100 cm/s LVOT VTI:    0.233 m AI PHT:      713 msec  AORTA Ao Root diam: 3.40 cm Ao Asc diam:  4.00 cm MITRAL VALVE MV Area (PHT): 3.85 cm     SHUNTS MV Decel Time: 197 msec     Systemic VTI:  0.23 m MV E velocity: 111.00 cm/s  Systemic Diam: 2.00 cm MV A velocity: 76.00 cm/s MV E/A ratio:  1.46 Rudean Haskell MD Electronically signed by Rudean Haskell MD Signature Date/Time: 10/10/2020/12:54:37 PM    Final      Subjective (from earlier progress note): Patient reports pain has resolved.  Patient does not want surgery. Explained concern/risks to not having surgery with regard to his problem. Recommended that he at least speak with general surgery as it related to operative management prior to making a final decision.  Discharge Exam: Vitals:   10/10/20 1210 10/10/20 1634  BP: (!) 148/72 (!) 158/83  Pulse: 64 69  Resp: 18 18  Temp: 98 F (36.7 C) 97.6 F (36.4 C)  SpO2: 93% 98%   Vitals:   10/10/20 0657 10/10/20 0808 10/10/20 1210 10/10/20 1634  BP: (!) 142/78 132/78 (!) 148/72 (!) 158/83  Pulse: 66 65 64 69  Resp: '17 18 18 18  '$ Temp: 97.7 F (36.5 C) 97.7 F (36.5 C) 98 F (36.7 C) 97.6 F (36.4 C)  TempSrc: Oral Oral Oral Oral  SpO2: 92% 98% 93% 98%    Examination (from earlier progress note):   General exam: Appears calm and comfortable Respiratory system: Clear to auscultation. Respiratory effort normal. Cardiovascular system: S1 & S2 heard, RRR. Gastrointestinal system: Abdomen is nondistended, soft and nontender. No organomegaly or masses felt. Normal bowel sounds heard. Central nervous system: Alert and oriented. No focal neurological deficits. Musculoskeletal: No edema. No calf tenderness Skin: No cyanosis. No rashes Psychiatry: Judgement and insight appear normal. Mood & affect appropriate.     The results of significant diagnostics from this hospitalization (including imaging, microbiology, ancillary and laboratory) are listed below for reference.     Microbiology: Recent Results (from the past 240 hour(s))  Culture, blood (routine x 2)     Status: None (Preliminary result)   Collection Time: 10/09/20 10:35 PM   Specimen: BLOOD RIGHT FOREARM  Result Value Ref Range Status   Specimen Description BLOOD RIGHT FOREARM  Final   Special Requests   Final    BOTTLES DRAWN AEROBIC AND ANAEROBIC Blood Culture adequate volume   Culture   Final    NO GROWTH < 12 HOURS Performed at [facility], [address] [place], [place] [address]     Report Status PENDING  Incomplete  Culture, blood (routine x 2)  Status: None (Preliminary result)   Collection Time: 10/09/20 10:45 PM   Specimen: BLOOD LEFT HAND  Result Value Ref Range Status   Specimen Description BLOOD LEFT HAND  Final   Special Requests   Final    BOTTLES DRAWN AEROBIC AND ANAEROBIC Blood Culture adequate volume   Culture   Final    NO GROWTH < 12 HOURS Performed at [facility], [address], [place], [place] [address]    Report Status PENDING  Incomplete  Resp Panel by RT-PCR (Flu A&B, Covid) Nasopharyngeal Swab     Status: None   Collection Time: 10/10/20  1:30 AM   Specimen: Nasopharyngeal Swab; Nasopharyngeal(NP) swabs in vial transport medium  Result Value Ref Range Status   SARS Coronavirus 2 by RT PCR NEGATIVE NEGATIVE Final    Comment: (NOTE) SARS-CoV-2 target nucleic acids are NOT DETECTED.  The SARS-CoV-2 RNA is generally detectable in upper respiratory specimens during the acute phase of infection. The lowest concentration of SARS-CoV-2 viral copies this assay can detect is 138 copies/mL. A negative result does not preclude SARS-Cov-2 infection and should not be used as the sole basis for treatment or other patient management decisions. A negative result may occur with  improper specimen collection/handling, submission of specimen other than nasopharyngeal swab, presence of viral mutation(s) within the areas targeted by this assay, and inadequate number of viral copies(<138 copies/mL). A negative result must be combined with clinical observations, patient history, and epidemiological information. The expected result is Negative.  Fact Sheet for Patients:  EntrepreneurPulse.com.au  Fact Sheet for Healthcare Providers:  IncredibleEmployment.be  This test is no t yet approved or cleared by the [place] FDA and  has been authorized for detection and/or diagnosis of SARS-CoV-2 by FDA under an  Emergency Use Authorization (EUA). This EUA will remain  in effect (meaning this test can be used) for the duration of the COVID-19 declaration under Section 564(b)(1) of the Act, 21 U.S.C.section 360bbb-3(b)(1), unless the authorization is terminated  or revoked sooner.       Influenza A by PCR NEGATIVE NEGATIVE Final   Influenza B by PCR NEGATIVE NEGATIVE Final    Comment: (NOTE) The Xpert Xpress SARS-CoV-2/FLU/RSV plus assay is intended as an aid in the diagnosis of influenza from Nasopharyngeal swab specimens and should not be used as a sole basis for treatment. Nasal washings and aspirates are unacceptable for Xpert Xpress SARS-CoV-2/FLU/RSV testing.  Fact Sheet for Patients: EntrepreneurPulse.com.au  Fact Sheet for Healthcare Providers: IncredibleEmployment.be  This test is not yet approved or cleared by the [place] FDA and has been authorized for detection and/or diagnosis of SARS-CoV-2 by FDA under an Emergency Use Authorization (EUA). This EUA will remain in effect (meaning this test can be used) for the duration of the COVID-19 declaration under Section 564(b)(1) of the Act, 21 U.S.C. section 360bbb-3(b)(1), unless the authorization is terminated or revoked.  Performed at [facility], [place] [address], [place], [place] [address]      Labs: BNP (last 3 results) No results for input(s): BNP in the last 8760 hours. Basic Metabolic Panel: Recent Labs  Lab 10/09/20 1525 10/10/20 0130 10/10/20 0439  NA 140  --  138  K 2.6*  --  3.5  CL 97*  --  103  CO2 32  --  28  GLUCOSE 126*  --  84  BUN 13  --  9  CREATININE 1.01  --  0.88  CALCIUM 9.3  --  8.4*  MG  --  1.8  --   PHOS  --  3.5  --    Liver Function Tests: Recent Labs  Lab 10/09/20 1525 10/10/20 0439  AST 489* 339*  ALT 119* 164*  ALKPHOS 97 96  BILITOT 2.7* 4.5*  PROT 6.5 5.2*  ALBUMIN 3.4* 2.6*   Recent Labs  Lab 10/09/20 1525  LIPASE 26    No results for input(s): AMMONIA in the last 168 hours. CBC: Recent Labs  Lab 10/09/20 1525 10/10/20 0439  WBC 13.8* 8.9  NEUTROABS 10.9*  --   HGB 13.7 12.0*  HCT 39.1 35.0*  MCV 98.2 98.6  PLT 235 196   Cardiac Enzymes: No results for input(s): CKTOTAL, CKMB, CKMBINDEX, TROPONINI in the last 168 hours. BNP: Invalid input(s): POCBNP CBG: No results for input(s): GLUCAP in the last 168 hours. D-Dimer No results for input(s): DDIMER in the last 72 hours. Hgb A1c No results for input(s): HGBA1C in the last 72 hours. Lipid Profile No results for input(s): CHOL, HDL, LDLCALC, TRIG, CHOLHDL, LDLDIRECT in the last 72 hours. Thyroid function studies No results for input(s): TSH, T4TOTAL, T3FREE, THYROIDAB in the last 72 hours.  Invalid input(s): FREET3 Anemia work up No results for input(s): VITAMINB12, FOLATE, FERRITIN, TIBC, IRON, RETICCTPCT in the last 72 hours. Urinalysis    Component Value Date/Time   COLORURINE AMBER (A) 10/09/2020 1525   APPEARANCEUR CLEAR 10/09/2020 1525   LABSPEC 1.014 10/09/2020 1525   PHURINE 6.0 10/09/2020 1525   GLUCOSEU NEGATIVE 10/09/2020 1525   HGBUR MODERATE (A) 10/09/2020 1525   BILIRUBINUR NEGATIVE 10/09/2020 1525   KETONESUR NEGATIVE 10/09/2020 1525   PROTEINUR 30 (A) 10/09/2020 1525   UROBILINOGEN 1.0 11/28/2006 1206   NITRITE NEGATIVE 10/09/2020 1525   LEUKOCYTESUR NEGATIVE 10/09/2020 1525   Sepsis Labs Invalid input(s): PROCALCITONIN,  WBC,  LACTICIDVEN Microbiology Recent Results (from the past 240 hour(s))  Culture, blood (routine x 2)     Status: None (Preliminary result)   Collection Time: 10/09/20 10:35 PM   Specimen: BLOOD RIGHT FOREARM  Result Value Ref Range Status   Specimen Description BLOOD RIGHT FOREARM  Final   Special Requests   Final    BOTTLES DRAWN AEROBIC AND ANAEROBIC Blood Culture adequate volume   Culture   Final    NO GROWTH < 12 HOURS Performed at [facility], [place] [address],  [place], [place] [address]    Report Status PENDING  Incomplete  Culture, blood (routine x 2)     Status: None (Preliminary result)   Collection Time: 10/09/20 10:45 PM   Specimen: BLOOD LEFT HAND  Result Value Ref Range Status   Specimen Description BLOOD LEFT HAND  Final   Special Requests   Final    BOTTLES DRAWN AEROBIC AND ANAEROBIC Blood Culture adequate volume   Culture   Final    NO GROWTH < 12 HOURS Performed at [facility], [place] [address], [place], [place] [address]    Report Status PENDING  Incomplete  Resp Panel by RT-PCR (Flu A&B, Covid) Nasopharyngeal Swab     Status: None   Collection Time: 10/10/20  1:30 AM   Specimen: Nasopharyngeal Swab; Nasopharyngeal(NP) swabs in vial transport medium  Result Value Ref Range Status   SARS Coronavirus 2 by RT PCR NEGATIVE NEGATIVE Final    Comment: (NOTE) SARS-CoV-2 target nucleic acids are NOT DETECTED.  The SARS-CoV-2 RNA is generally detectable in upper respiratory specimens during the acute phase of infection. The lowest  concentration of SARS-CoV-2 viral copies this assay can detect is 138 copies/mL. A negative result does not preclude SARS-Cov-2 infection and should not be used as the sole basis for treatment or other patient management decisions. A negative result may occur with  improper specimen collection/handling, submission of specimen other than nasopharyngeal swab, presence of viral mutation(s) within the areas targeted by this assay, and inadequate number of viral copies(<138 copies/mL). A negative result must be combined with clinical observations, patient history, and epidemiological information. The expected result is Negative.  Fact Sheet for Patients:  EntrepreneurPulse.com.au  Fact Sheet for Healthcare Providers:  IncredibleEmployment.be  This test is no t yet approved or cleared by the [place] FDA and  has been authorized for detection and/or diagnosis of  SARS-CoV-2 by FDA under an Emergency Use Authorization (EUA). This EUA will remain  in effect (meaning this test can be used) for the duration of the COVID-19 declaration under Section 564(b)(1) of the Act, 21 U.S.C.section 360bbb-3(b)(1), unless the authorization is terminated  or revoked sooner.       Influenza A by PCR NEGATIVE NEGATIVE Final   Influenza B by PCR NEGATIVE NEGATIVE Final    Comment: (NOTE) The Xpert Xpress SARS-CoV-2/FLU/RSV plus assay is intended as an aid in the diagnosis of influenza from Nasopharyngeal swab specimens and should not be used as a sole basis for treatment. Nasal washings and aspirates are unacceptable for Xpert Xpress SARS-CoV-2/FLU/RSV testing.  Fact Sheet for Patients: EntrepreneurPulse.com.au  Fact Sheet for Healthcare Providers: IncredibleEmployment.be  This test is not yet approved or cleared by the [place] FDA and has been authorized for detection and/or diagnosis of SARS-CoV-2 by FDA under an Emergency Use Authorization (EUA). This EUA will remain in effect (meaning this test can be used) for the duration of the COVID-19 declaration under Section 564(b)(1) of the Act, 21 U.S.C. section 360bbb-3(b)(1), unless the authorization is terminated or revoked.  Performed at [facility], [place] [address], [place], [place] [address]     SIGNED:   Cordelia Poche, MD [facility] 10/10/2020, 5:05 PM

## 2020-10-10 NOTE — ED Notes (Signed)
Attempted report. 5C did not answer phone x2

## 2020-10-10 NOTE — Consult Note (Addendum)
Referring Provider: Dr. Lonny Prude, [facility] Primary Care Physician:  Rowyn Holms, NP Primary Gastroenterologist:  Althia Forts, had colonoscopy at [facility] within the past 1-2 years  Reason for Consultation:  Epigastric pain, elevated LFTs  HPI: Leonard [name] is a 70 y.o. male with medical history significant of HTN, HFrEF (EF 30-35% as of 2019), ongoing EtOH abuse, prostate CA.   Pt presents to the ED with c/o epigastric pain, N/V that was sudden in onset yesterday afternoon (9/1).  Says that he's never had any pain like this in the past.  Says that the pain just kept getting worse.  Gingerale and pepto-bismol did not help symptoms so he called EMS.  No fevers or chills at home.  AST 489>>339, ALT 119>>164, T BILI 2.7>>4.5, ALP normal.  WBC count was initially 13.8 but he was placed on Zosyn and it is normal this AM.   Lipase normal.  CT scan of the abdomen and pelvis with contrast showed the following:  IMPRESSION: 1. Findings are suggestive of obstructive choledocholithiasis with mild intra and extrahepatic biliary ductal dilatation and moderate gallbladder distension. There is also an increasing mass-like density in the fundus of the gallbladder which may simply represent adenomyomatosis, however, underlying gallbladder neoplasm is difficult to entirely exclude. Further evaluation with abdominal MRI with and without IV gadolinium with MRCP is recommended at this time. 2. Aortic atherosclerosis, in addition to at least left anterior descending coronary artery disease. Assessment for potential risk factor modification, dietary therapy or pharmacologic therapy may be warranted, if clinically indicated. 3. Additional incidental findings, as above.   MRI abdomen/MRCP showed the following:  IMPRESSION: Cholelithiasis with mild gallbladder wall thickening/edema and gallbladder distension. Early acute cholecystitis is possible, although equivocal. Consider hepatobiliary  nuclear medicine scan as clinically warranted.   Common duct measures 8 mm, within normal limits, tapering at the ampulla. No choledocholithiasis is evident on MR. Given the CT appearance, recent passage of a tiny distal CBD stone is possible.   Nodular thickening of the gallbladder fundus is chronic dating back to 2015, favoring benign gallbladder adenomyomatosis.  Reports that he's had colonoscopy at [facility] within the past 1-2 years.  He reports that he drinks close to a gallon of whiskey a week.  Past Medical History:  Diagnosis Date   Asthma    CHF (congestive heart failure) ([place])    Hypertension    Prostate cancer [facility])     Past Surgical History:  Procedure Laterality Date   APPENDECTOMY     HYDROCELE EXCISION Left 12/01/2015   Procedure: REPAIR OF LEFT HYDROCELE;  Surgeon: Raynelle Bring, MD;  Location: [facility];  Service: Urology;  Laterality: Left;   LYMPHADENECTOMY Bilateral 09/24/2013   Procedure: LYMPHADENECTOMY;  Surgeon: Raynelle Bring, MD;  Location: [facility];  Service: Urology;  Laterality: Bilateral;   PROSTATE BIOPSY     RIGHT/LEFT HEART CATH AND CORONARY ANGIOGRAPHY N/A 06/10/2016   Procedure: Right/Left Heart Cath and Coronary Angiography;  Surgeon: Jettie Booze, MD;  Location: [facility];  Service: Cardiovascular;  Laterality: N/A;   ROBOT ASSISTED LAPAROSCOPIC RADICAL PROSTATECTOMY N/A 09/24/2013   Procedure: ROBOTIC ASSISTED LAPAROSCOPIC RADICAL PROSTATECTOMY LEVEL 2;  Surgeon: Raynelle Bring, MD;  Location: [facility];  Service: Urology;  Laterality: N/A;    Prior to Admission medications   Medication Sig Start Date End Date Taking? Authorizing Provider  albuterol (PROVENTIL HFA;VENTOLIN HFA) 108 (90 Base) MCG/ACT inhaler Inhale 2 puffs into the lungs every 4 (four) hours as needed for  wheezing or shortness of breath. 08/10/17  Yes Horton, Barbette Hair, MD  albuterol (PROVENTIL) (2.5 MG/3ML) 0.083% nebulizer solution Take 3 mLs (2.5 mg total)  by nebulization every 6 (six) hours as needed for wheezing or shortness of breath. 01/19/16  Yes Milagros Loll, MD  amLODipine (NORVASC) 5 MG tablet Take 1 tablet by mouth daily Patient taking differently: Take 5 mg by mouth daily. 04/25/18  Yes Imogene Burn, PA-C  carvedilol (COREG) 25 MG tablet Take 1 tablet (25 mg total) by mouth 2 (two) times daily. 09/25/19  Yes Jettie Booze, MD  dorzolamide-timolol (COSOPT) 22.3-6.8 MG/ML ophthalmic solution Place 1 drop into both eyes 2 (two) times daily. 08/05/18  Yes [provider]  ENTRESTO 97-103 MG Take 1 tablet by mouth 2 (two) times daily. 10/04/18  Yes [provider]  furosemide (LASIX) 40 MG tablet Take 1 tablet (40 mg total) by mouth daily. You may take an extra tablet in the PM AS NEEDED for swelling up to twice a week 09/25/19  Yes Jettie Booze, MD  latanoprost (XALATAN) 0.005 % ophthalmic solution Place 1 drop into both eyes at bedtime.  03/04/16  Yes [provider]  SYMBICORT 160-4.5 MCG/ACT inhaler Inhale 1 puff into the lungs daily as needed (shortness of breath). 12/08/17  Yes [provider]    Current Facility-Administered Medications  Medication Dose Route Frequency Provider Last Rate Last Admin   albuterol (PROVENTIL) (2.5 MG/3ML) 0.083% nebulizer solution 3 mL  3 mL Inhalation Q4H PRN Etta Quill, DO       amLODipine (NORVASC) tablet 5 mg  5 mg Oral Daily Alcario Drought, Jared M, DO       carvedilol (COREG) tablet 25 mg  25 mg Oral BID Jennette Kettle M, DO   25 mg at 10/10/20 0128   dorzolamide-timolol (COSOPT) 22.3-6.8 MG/ML ophthalmic solution 1 drop  1 drop Both Eyes BID Jennette Kettle M, DO       folic acid (FOLVITE) tablet 1 mg  1 mg Oral Daily Alcario Drought, Jared M, DO       HYDROmorphone (DILAUDID) injection 0.5-1 mg  0.5-1 mg Intravenous Q2H PRN Etta Quill, DO   1 mg at 10/10/20 3893   lactated ringers infusion   Intravenous Continuous Etta Quill, DO 125 mL/hr at  10/10/20 0125 New Bag at 10/10/20 0125   latanoprost (XALATAN) 0.005 % ophthalmic solution 1 drop  1 drop Both Eyes QHS Jennette Kettle M, DO       LORazepam (ATIVAN) tablet 1-4 mg  1-4 mg Oral Q1H PRN Etta Quill, DO       Or   LORazepam (ATIVAN) injection 1-4 mg  1-4 mg Intravenous Q1H PRN Etta Quill, DO       mometasone-formoterol (DULERA) 200-5 MCG/ACT inhaler 2 puff  2 puff Inhalation BID Jennette Kettle M, DO       multivitamin with minerals tablet 1 tablet  1 tablet Oral Daily Alcario Drought, Jared M, DO       ondansetron [facility]) tablet 4 mg  4 mg Oral Q6H PRN Etta Quill, DO       Or   ondansetron [facility]) injection 4 mg  4 mg Intravenous Q6H PRN Etta Quill, DO       piperacillin-tazobactam (ZOSYN) IVPB 3.375 g  3.375 g Intravenous Q8H Horton, Kristie M, DO   Stopped at 10/10/20 0530   sacubitril-valsartan (ENTRESTO) 97-103 mg per tablet  1 tablet Oral BID Etta Quill,  DO   1 tablet at 10/10/20 0129   thiamine tablet 100 mg  100 mg Oral Daily Etta Quill, DO        Allergies as of 10/09/2020 - Review Complete 10/09/2020  Allergen Reaction Noted   Losartan Rash and Other (See Comments) 02/18/2016    Family History  Problem Relation Age of Onset   Hypertension Sister    Cancer Neg Hx     Social History   Socioeconomic History   Marital status: Single    Spouse name: Not on file   Number of children: 0   Years of education: 12   Highest education level: 12th grade  Occupational History   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  Tobacco Use   Smoking status: Every Day    Packs/day: 0.25    Years: 30.00    Pack years: 7.50    Types: Cigarettes    Last attempt to quit: 12/24/2015    Years since quitting: 4.8   Smokeless tobacco: Never   Tobacco comments:    STtses he has cut down from 2 PPD to 4 cigarettes per day  Vaping Use   Vaping Use: Never used  Substance and Sexual Activity    Alcohol use: Yes    Alcohol/week: 6.0 - 7.0 standard drinks    Types: 6 - 7 Shots of liquor per week    Comment: states 6-7 shots of liquor daily   Drug use: No    Types: Marijuana   Sexual activity: Not Currently  Other Topics Concern   Not on file  Social History Narrative   Not on file   Social Determinants of Health   Financial Resource Strain: Low Risk    Difficulty of Paying Living Expenses: Not hard at all  Food Insecurity: No Food Insecurity   Worried About Charity fundraiser in the Last Year: Never true   Ran Out of Food in the Last Year: Never true  Transportation Needs: No Transportation Needs   Lack of Transportation (Medical): No   Lack of Transportation (Non-Medical): No  Physical Activity: Inactive   Days of Exercise per Week: 0 days   Minutes of Exercise per Session: 0 min  Stress: No Stress Concern Present   Feeling of Stress : Only a little  Social Connections: Unknown   Frequency of Communication with Friends and Family: More than three times a week   Frequency of Social Gatherings with Friends and Family: More than three times a week   Attends Religious Services: More than 4 times per year   Active Member of [employer] or Organizations: Yes   Attends Music therapist: More than 4 times per year   Marital Status: Not on file  Intimate Partner Violence: Not At Risk   Fear of Current or Ex-Partner: No   Emotionally Abused: No   Physically Abused: No   Sexually Abused: No   Review of Systems: ROS is O/W negative except as mentioned in HPI.  Physical Exam: Vital signs in last 24 hours: Temp:  [97.7 F (36.5 C)-98.9 F (37.2 C)] 97.7 F (36.5 C) (09/02 0808) Pulse Rate:  [64-83] 65 (09/02 0808) Resp:  [14-20] 18 (09/02 0808) BP: (101-175)/(72-98) 132/78 (09/02 0808) SpO2:  [89 %-100 %] 98 % (09/02 0808) Last BM Date: 10/09/20 General:  Alert, Well-developed, well-nourished, pleasant and cooperative in NAD Head:  Normocephalic and  atraumatic. Eyes:  Sclera clear,  no icterus.  Conjunctiva pink. Ears:  Normal auditory acuity. Mouth:  No deformity or lesions.   Lungs:  Clear throughout to auscultation.  No wheezes, crackles, or rhonchi.  Heart:  Regular rate and rhythm; no murmurs, clicks, rubs, or gallops. Abdomen:  Soft, non-distended.  BS present.  Mild epigastric TTP. Msk:  Symmetrical without gross deformities. Pulses:  Normal pulses noted. Extremities:  Without clubbing or edema. Neurologic:  Alert and oriented x 4;  grossly normal neurologically. Skin:  Intact without significant lesions or rashes. Psych:  Alert and cooperative. Normal mood and affect.  Intake/Output from previous day: 09/01 0701 - 09/02 0700 In: 200 [IV Piggyback:200] Out: -   Lab Results: Recent Labs    10/09/20 1525 10/10/20 0439  WBC 13.8* 8.9  HGB 13.7 12.0*  HCT 39.1 35.0*  PLT 235 196   BMET Recent Labs    10/09/20 1525 10/10/20 0439  NA 140 138  K 2.6* 3.5  CL 97* 103  CO2 32 28  GLUCOSE 126* 84  BUN 13 9  CREATININE 1.01 0.88  CALCIUM 9.3 8.4*   LFT Recent Labs    10/10/20 0439  PROT 5.2*  ALBUMIN 2.6*  AST 339*  ALT 164*  ALKPHOS 96  BILITOT 4.5*   Studies/Results: CT ABDOMEN PELVIS W CONTRAST  Result Date: 10/09/2020 CLINICAL DATA:  70 year old male with history of abdominal distension. EXAM: CT ABDOMEN AND PELVIS WITH CONTRAST TECHNIQUE: Multidetector CT imaging of the abdomen and pelvis was performed using the standard protocol following bolus administration of intravenous contrast. CONTRAST:  1103m OMNIPAQUE IOHEXOL 350 MG/ML SOLN COMPARISON:  CT of the abdomen and pelvis 08/08/2013. FINDINGS: Lower chest: Atherosclerotic calcifications in the descending thoracic aorta as well as the left anterior descending coronary artery. Hepatobiliary: Subcentimeter low-attenuation lesion in segment 2 of the liver, too small to characterize, but statistically likely represent a tiny cyst. No other larger more  suspicious appearing hepatic lesions are noted. Mild intrahepatic and extrahepatic biliary ductal dilatation. Common bile duct measures up to 8 mm in the porta hepatis. In the distal common bile duct there are some subtle areas of increased density, best appreciated on coronal image 60 of series 6 and axial image 39 of series 3, suspicious for potential choledocholithiasis. Gallbladder is moderately distended. There is again focal soft tissue thickening and either soft tissue enhancement or faint soft tissue calcification in the fundus of the gallbladder (axial image 40 of series 3, coronal image 35 of series 6 and sagittal image 34 of series 7) measuring 3.4 x 1.9 x 4.0 cm which is slightly more prominent than prior examination from 2015. Pancreas: No pancreatic mass. No pancreatic ductal dilatation. No pancreatic or peripancreatic fluid collections or inflammatory changes. Spleen: Unremarkable. Adrenals/Urinary Tract: Several subcentimeter low-attenuation lesions in both kidneys, too small to characterize, but statistically likely represent cysts. Left-sided extrarenal pelvis (normal anatomical variant), similar to the prior study. No hydroureteronephrosis. Urinary bladder is normal in appearance. Bilateral adrenal glands are normal in appearance. Stomach/Bowel: The appearance of the stomach is normal. There is no pathologic dilatation of small bowel or colon. The appendix is not confidently identified and may be surgically absent. Regardless, there are no inflammatory changes noted adjacent to the cecum to suggest the presence of an acute appendicitis at this time. Vascular/Lymphatic: Atherosclerotic calcifications in the abdominal aorta and pelvic vasculature, without evidence of aneurysm or dissection. No lymphadenopathy noted in the abdomen or pelvis. Reproductive: Status post radical prostatectomy. Seminal vesicles are unremarkable in appearance. Other:  No significant volume of ascites.  No pneumoperitoneum.  Musculoskeletal: Prominent Schmorl's nodes in the superior aspect of the L5 and S1 vertebral bodies. There are no aggressive appearing lytic or blastic lesions noted in the visualized portions of the skeleton. IMPRESSION: 1. Findings are suggestive of obstructive choledocholithiasis with mild intra and extrahepatic biliary ductal dilatation and moderate gallbladder distension. There is also an increasing mass-like density in the fundus of the gallbladder which may simply represent adenomyomatosis, however, underlying gallbladder neoplasm is difficult to entirely exclude. Further evaluation with abdominal MRI with and without IV gadolinium with MRCP is recommended at this time. 2. Aortic atherosclerosis, in addition to at least left anterior descending coronary artery disease. Assessment for potential risk factor modification, dietary therapy or pharmacologic therapy may be warranted, if clinically indicated. 3. Additional incidental findings, as above. Electronically Signed   By: Vinnie Langton M.D.   On: 10/09/2020 18:29   MR ABDOMEN MRCP W WO CONTAST  Result Date: 10/10/2020 CLINICAL DATA:  Epigastric pain, nausea/vomiting, ETOH, jaundice EXAM: MRI ABDOMEN WITHOUT AND WITH CONTRAST (INCLUDING MRCP) TECHNIQUE: Multiplanar multisequence MR imaging of the abdomen was performed both before and after the administration of intravenous contrast. Heavily T2-weighted images of the biliary and pancreatic ducts were obtained, and three-dimensional MRCP images were rendered by post processing. CONTRAST:  15m GADAVIST GADOBUTROL 1 MMOL/ML IV SOLN COMPARISON:  CT abdomen/pelvis dated 10/09/2020 and 08/08/2013. FINDINGS: Motion degraded images. Lower chest: Lung bases are clear. Hepatobiliary: Subcentimeter cyst in the left hepatic lobe (series 8/image 9). No suspicious/enhancing hepatic lesions, noting motion degradation. Layering gallstones with mild gallbladder wall edema and gallbladder distension. Early acute  cholecystitis is possible, although this is equivocal. Nodular thickening of the gallbladder fundus (series 4/image 22) is chronic dating back to 2015 and favors benign gallbladder adenomyomatosis. Mild central intrahepatic ductal prominence, improved from recent CT. Common duct measures 8 mm, within normal limits, tapering at the ampulla. Despite the CT appearance, there is no evidence of choledocholithiasis on MR. Pancreas:  Within normal limits. Spleen:  Within normal limits. Adrenals/Urinary Tract:  Adrenal glands are within normal limits. Bilateral renal cysts, measuring up to 11 mm in the left lower kidney (series 19/image 13). No hydronephrosis. Stomach/Bowel: Stomach is within normal limits. Visualized bowel is unremarkable. Vascular/Lymphatic:  No evidence of abdominal aortic aneurysm. No suspicious abdominal lymphadenopathy. Other:  No abdominal ascites. Musculoskeletal: No focal osseous lesions. IMPRESSION: Cholelithiasis with mild gallbladder wall thickening/edema and gallbladder distension. Early acute cholecystitis is possible, although equivocal. Consider hepatobiliary nuclear medicine scan as clinically warranted. Common duct measures 8 mm, within normal limits, tapering at the ampulla. No choledocholithiasis is evident on MR. Given the CT appearance, recent passage of a tiny distal CBD stone is possible. Nodular thickening of the gallbladder fundus is chronic dating back to 2015, favoring benign gallbladder adenomyomatosis. Electronically Signed   By: SJulian HyM.D.   On: 10/10/2020 01:18    IMPRESSION:  *70year old male presenting with complaints of epigastric abdominal pain with nausea and vomiting.  Found to have elevated LFTs with the exception of the alk phos, which is normal.  MRI imaging showing cholelithiasis, but normal bile duct with suspicion for recent passage of a tiny distal common bile duct stone.  Also has nodular thickening of the gallbladder fundus that appears chronic  dating back to 2015 favoring benign gallbladder adenomyomatosis.  Mild gallbladder wall thickening and edema with gallbladder distention, question early acute cholecystitis.  Total bilirubin trending up this morning.  He did have  an elevated white blood cell count on presentation, has been placed on Zosyn, and white blood cell count is now normal this morning.  No fevers. *History of alcohol abuse: No evidence of chronic liver disease on MRI of the abdomen.  Platelets normal.  Drinks close to a gallon of whiskey a week.  PLAN: *No plans for indication for ERCP currently. *Is on Zosyn, continue for now. *I have consulted surgery.  ? HIDA scan.  ? Cholecystectomy with IOC. *Will check PT/INR.   Laban Emperor. Zehr  10/10/2020, 9:00 AM   ________________________________________________________________________  Velora Heckler GI MD note:  I personally examined the patient, reviewed the data and agree with the assessment and plan described above. 70 yo man who drinks 1 gallon of whisky every week and considers himself to be an alcoholic however fortunately does not have evidence of cirrhosis on imaging, has normal plts and a normal INR.  I think he is having trouble with symptomatic gallstones and might have passed a CBD stone in the past 24 hours. Alcoholic hepatitis may be playing some role in his LFT elevation as well. I explained that he should consider cholecystectomy with IOC and he was quite clear that he does not want any type of surgery or procedure. He would like to clear this up with time and antibiotics only.    Given MRI suggesting a passed cbd stone, ERCP is not indicated currently and he would not agree to one anyway. If LFTs continue to rise and he becomes cholangitic he may change his mind.  I still think that surgical evaluation is a good idea to formally explain to him again about symptomatic gallstones, need for cholecystectomy.  Will follow along, will recheck LFTs in the morning.   Owens Loffler, MD [facility] Pager [phone]

## 2020-10-10 NOTE — Progress Notes (Signed)
PROGRESS NOTE    Leonard [name]  [id number] DOB: 11-May-1950 DOA: 10/09/2020 PCP: Mahmud Holms, NP   Brief Narrative: Leonard [name] is a 70 y.o. male with history of hypertension, chronic systolic heart failure, ethanol abuse, prostate cancer.  Patient presented secondary to abdominal pain was found to have evidence of choledocholithiasis.  Repeat imaging included MRCP which was significant for evidence of likely passed gallstone.  GI was initially consulted but secondary to resolution of all symptoms and no obstruction, referred to general surgery for consideration of laparoscopic cholecystectomy.   Assessment & Plan:   Principal Problem:   Choledocholithiasis with obstruction Active Problems:   Essential hypertension   Nonischemic cardiomyopathy (HCC)   Alcohol abuse   Gallbladder mass   Chronic HFrEF (heart failure with reduced ejection fraction) (HCC)   Choledocholithiasis with obstruction Possibly cholecystitis Noted on CT. GI consulted. MRCP shows evidence of likely passed gallstone in addition to possible early cholecystitis. GI has signed off but have consulted general surgery for consideration of cholecystectomy. AST/ALT continue to be elevated. Normal alkaline phosphatase level. -General surgery recommendations: possibly laparoscopic cholecystectomy pending patient decision and cardiology recommendations -CMP in AM  Hypokalemia Potassium repletion given.  History of ethanol abuse -Continue CIWA  Primary hypertension -Continue Entresto, amlodipine and Coreg  Chronic systolic heart failure EF of 30-35% from 2019. Currently euvolemic. -Continue Entresto, Coreg, Lasix  COPD Asymptomatic -Continue Dulera (substituted for Symbicort), albuterol   DVT prophylaxis: SCDs Code Status:   Code Status: Full Code Family Communication: None at bedside Disposition Plan: Discharge home likely in 2-4 days if patient agrees to cholecystectomy, vs possibly 24 hours if  continues to decline any treatment   Consultants:  [facility]  Procedures:  None  Antimicrobials: Zosyn IV    Subjective: Patient reports pain has resolved. Patient does not want surgery. Explained concern/risks to not having surgery with regard to his problem. Recommended that he at least speak with general surgery as it related to operative management prior to making a final decision.  Objective: Vitals:   10/10/20 0145 10/10/20 0459 10/10/20 0618 10/10/20 0657  BP: (!) 154/78 138/72 101/84 (!) 142/78  Pulse: 66 71 64 66  Resp:  '15 16 17  '$ Temp:   98.2 F (36.8 C) 97.7 F (36.5 C)  TempSrc:   Oral Oral  SpO2:  100% 97% 92%    Intake/Output Summary (Last 24 hours) at 10/10/2020 0802 Last data filed at 10/10/2020 [id number] Gross per 24 hour  Intake 200 ml  Output --  Net 200 ml   There were no vitals filed for this visit.  Examination:  General exam: Appears calm and comfortable Respiratory system: Clear to auscultation. Respiratory effort normal. Cardiovascular system: S1 & S2 heard, RRR. Gastrointestinal system: Abdomen is nondistended, soft and nontender. No organomegaly or masses felt. Normal bowel sounds heard. Central nervous system: Alert and oriented. No focal neurological deficits. Musculoskeletal: No edema. No calf tenderness Skin: No cyanosis. No rashes Psychiatry: Judgement and insight appear normal. Mood & affect appropriate.     Data Reviewed: I have personally reviewed following labs and imaging studies  CBC Lab Results  Component Value Date   WBC 8.9 10/10/2020   RBC 3.55 (L) 10/10/2020   HGB 12.0 (L) 10/10/2020   HCT 35.0 (L) 10/10/2020   MCV 98.6 10/10/2020   MCH 33.8 10/10/2020   PLT 196 10/10/2020   MCHC 34.3 10/10/2020   RDW 12.9 10/10/2020   LYMPHSABS 1.7 10/09/2020   MONOABS 1.0 10/09/2020  EOSABS 0.2 10/09/2020   BASOSABS 0.0 [id number]     Last metabolic panel Lab Results  Component Value Date   NA 138 10/10/2020    K 3.5 10/10/2020   CL 103 10/10/2020   CO2 28 10/10/2020   BUN 9 10/10/2020   CREATININE 0.88 10/10/2020   GLUCOSE 84 10/10/2020   GFRNONAA >60 10/10/2020   GFRAA 76 04/24/2019   CALCIUM 8.4 (L) 10/10/2020   PHOS 3.5 10/10/2020   PROT 5.2 (L) 10/10/2020   ALBUMIN 2.6 (L) 10/10/2020   LABGLOB 2.6 10/18/2017   AGRATIO 1.5 10/18/2017   BILITOT 4.5 (H) 10/10/2020   ALKPHOS 96 10/10/2020   AST 339 (H) 10/10/2020   ALT 164 (H) 10/10/2020   ANIONGAP 7 10/10/2020    CBG (last 3)  No results for input(s): GLUCAP in the last 72 hours.   GFR: Estimated Creatinine Clearance: 73 mL/min (by C-G formula based on SCr of 0.88 mg/dL).  Coagulation Profile: No results for input(s): INR, PROTIME in the last 168 hours.  Recent Results (from the past 240 hour(s))  Resp Panel by RT-PCR (Flu A&B, Covid) Nasopharyngeal Swab     Status: None   Collection Time: 10/10/20  1:30 AM   Specimen: Nasopharyngeal Swab; Nasopharyngeal(NP) swabs in vial transport medium  Result Value Ref Range Status   SARS Coronavirus 2 by RT PCR NEGATIVE NEGATIVE Final    Comment: (NOTE) SARS-CoV-2 target nucleic acids are NOT DETECTED.  The SARS-CoV-2 RNA is generally detectable in upper respiratory specimens during the acute phase of infection. The lowest concentration of SARS-CoV-2 viral copies this assay can detect is 138 copies/mL. A negative result does not preclude SARS-Cov-2 infection and should not be used as the sole basis for treatment or other patient management decisions. A negative result may occur with  improper specimen collection/handling, submission of specimen other than nasopharyngeal swab, presence of viral mutation(s) within the areas targeted by this assay, and inadequate number of viral copies(<138 copies/mL). A negative result must be combined with clinical observations, patient history, and epidemiological information. The expected result is Negative.  Fact Sheet for Patients:   EntrepreneurPulse.com.au  Fact Sheet for Healthcare Providers:  IncredibleEmployment.be  This test is no t yet approved or cleared by the [place] FDA and  has been authorized for detection and/or diagnosis of SARS-CoV-2 by FDA under an Emergency Use Authorization (EUA). This EUA will remain  in effect (meaning this test can be used) for the duration of the COVID-19 declaration under Section 564(b)(1) of the Act, 21 U.S.C.section 360bbb-3(b)(1), unless the authorization is terminated  or revoked sooner.       Influenza A by PCR NEGATIVE NEGATIVE Final   Influenza B by PCR NEGATIVE NEGATIVE Final    Comment: (NOTE) The Xpert Xpress SARS-CoV-2/FLU/RSV plus assay is intended as an aid in the diagnosis of influenza from Nasopharyngeal swab specimens and should not be used as a sole basis for treatment. Nasal washings and aspirates are unacceptable for Xpert Xpress SARS-CoV-2/FLU/RSV testing.  Fact Sheet for Patients: EntrepreneurPulse.com.au  Fact Sheet for Healthcare Providers: IncredibleEmployment.be  This test is not yet approved or cleared by the [place] FDA and has been authorized for detection and/or diagnosis of SARS-CoV-2 by FDA under an Emergency Use Authorization (EUA). This EUA will remain in effect (meaning this test can be used) for the duration of the COVID-19 declaration under Section 564(b)(1) of the Act, 21 U.S.C. section 360bbb-3(b)(1), unless the authorization is terminated or revoked.  Performed at [facility], [place] [address], [place], [place] [address]         Radiology Studies: CT ABDOMEN PELVIS W CONTRAST  Result Date: 10/09/2020 CLINICAL DATA:  70 year old male with history of abdominal distension. EXAM: CT ABDOMEN AND PELVIS WITH CONTRAST TECHNIQUE: Multidetector CT imaging of the abdomen and pelvis was performed using the standard protocol following bolus  administration of intravenous contrast. CONTRAST:  140m OMNIPAQUE IOHEXOL 350 MG/ML SOLN COMPARISON:  CT of the abdomen and pelvis 08/08/2013. FINDINGS: Lower chest: Atherosclerotic calcifications in the descending thoracic aorta as well as the left anterior descending coronary artery. Hepatobiliary: Subcentimeter low-attenuation lesion in segment 2 of the liver, too small to characterize, but statistically likely represent a tiny cyst. No other larger more suspicious appearing hepatic lesions are noted. Mild intrahepatic and extrahepatic biliary ductal dilatation. Common bile duct measures up to 8 mm in the porta hepatis. In the distal common bile duct there are some subtle areas of increased density, best appreciated on coronal image 60 of series 6 and axial image 39 of series 3, suspicious for potential choledocholithiasis. Gallbladder is moderately distended. There is again focal soft tissue thickening and either soft tissue enhancement or faint soft tissue calcification in the fundus of the gallbladder (axial image 40 of series 3, coronal image 35 of series 6 and sagittal image 34 of series 7) measuring 3.4 x 1.9 x 4.0 cm which is slightly more prominent than prior examination from 2015. Pancreas: No pancreatic mass. No pancreatic ductal dilatation. No pancreatic or peripancreatic fluid collections or inflammatory changes. Spleen: Unremarkable. Adrenals/Urinary Tract: Several subcentimeter low-attenuation lesions in both kidneys, too small to characterize, but statistically likely represent cysts. Left-sided extrarenal pelvis (normal anatomical variant), similar to the prior study. No hydroureteronephrosis. Urinary bladder is normal in appearance. Bilateral adrenal glands are normal in appearance. Stomach/Bowel: The appearance of the stomach is normal. There is no pathologic dilatation of small bowel or colon. The appendix is not confidently identified and may be surgically absent. Regardless, there are no  inflammatory changes noted adjacent to the cecum to suggest the presence of an acute appendicitis at this time. Vascular/Lymphatic: Atherosclerotic calcifications in the abdominal aorta and pelvic vasculature, without evidence of aneurysm or dissection. No lymphadenopathy noted in the abdomen or pelvis. Reproductive: Status post radical prostatectomy. Seminal vesicles are unremarkable in appearance. Other: No significant volume of ascites.  No pneumoperitoneum. Musculoskeletal: Prominent Schmorl's nodes in the superior aspect of the L5 and S1 vertebral bodies. There are no aggressive appearing lytic or blastic lesions noted in the visualized portions of the skeleton. IMPRESSION: 1. Findings are suggestive of obstructive choledocholithiasis with mild intra and extrahepatic biliary ductal dilatation and moderate gallbladder distension. There is also an increasing mass-like density in the fundus of the gallbladder which may simply represent adenomyomatosis, however, underlying gallbladder neoplasm is difficult to entirely exclude. Further evaluation with abdominal MRI with and without IV gadolinium with MRCP is recommended at this time. 2. Aortic atherosclerosis, in addition to at least left anterior descending coronary artery disease. Assessment for potential risk factor modification, dietary therapy or pharmacologic therapy may be warranted, if clinically indicated. 3. Additional incidental findings, as above. Electronically Signed   By: DVinnie LangtonM.D.   On: 10/09/2020 18:29   MR ABDOMEN MRCP W WO CONTAST  Result Date: 10/10/2020 CLINICAL DATA:  Epigastric pain, nausea/vomiting, ETOH, jaundice EXAM: MRI ABDOMEN WITHOUT AND WITH CONTRAST (INCLUDING MRCP) TECHNIQUE: Multiplanar multisequence MR imaging of the abdomen was performed both before and after the  administration of intravenous contrast. Heavily T2-weighted images of the biliary and pancreatic ducts were obtained, and three-dimensional MRCP images  were rendered by post processing. CONTRAST:  55m GADAVIST GADOBUTROL 1 MMOL/ML IV SOLN COMPARISON:  CT abdomen/pelvis dated 10/09/2020 and 08/08/2013. FINDINGS: Motion degraded images. Lower chest: Lung bases are clear. Hepatobiliary: Subcentimeter cyst in the left hepatic lobe (series 8/image 9). No suspicious/enhancing hepatic lesions, noting motion degradation. Layering gallstones with mild gallbladder wall edema and gallbladder distension. Early acute cholecystitis is possible, although this is equivocal. Nodular thickening of the gallbladder fundus (series 4/image 22) is chronic dating back to 2015 and favors benign gallbladder adenomyomatosis. Mild central intrahepatic ductal prominence, improved from recent CT. Common duct measures 8 mm, within normal limits, tapering at the ampulla. Despite the CT appearance, there is no evidence of choledocholithiasis on MR. Pancreas:  Within normal limits. Spleen:  Within normal limits. Adrenals/Urinary Tract:  Adrenal glands are within normal limits. Bilateral renal cysts, measuring up to 11 mm in the left lower kidney (series 19/image 13). No hydronephrosis. Stomach/Bowel: Stomach is within normal limits. Visualized bowel is unremarkable. Vascular/Lymphatic:  No evidence of abdominal aortic aneurysm. No suspicious abdominal lymphadenopathy. Other:  No abdominal ascites. Musculoskeletal: No focal osseous lesions. IMPRESSION: Cholelithiasis with mild gallbladder wall thickening/edema and gallbladder distension. Early acute cholecystitis is possible, although equivocal. Consider hepatobiliary nuclear medicine scan as clinically warranted. Common duct measures 8 mm, within normal limits, tapering at the ampulla. No choledocholithiasis is evident on MR. Given the CT appearance, recent passage of a tiny distal CBD stone is possible. Nodular thickening of the gallbladder fundus is chronic dating back to 2015, favoring benign gallbladder adenomyomatosis. Electronically Signed    By: SJulian HyM.D.   On: 10/10/2020 01:18        Scheduled Meds:  amLODipine  5 mg Oral Daily   carvedilol  25 mg Oral BID   dorzolamide-timolol  1 drop Both Eyes BID   folic acid  1 mg Oral Daily   latanoprost  1 drop Both Eyes QHS   mometasone-formoterol  2 puff Inhalation BID   multivitamin with minerals  1 tablet Oral Daily   sacubitril-valsartan  1 tablet Oral BID   thiamine  100 mg Oral Daily   Or   thiamine  100 mg Intravenous Daily   Continuous Infusions:  lactated ringers 125 mL/hr at 10/10/20 0125   piperacillin-tazobactam (ZOSYN)  IV Stopped (10/10/20 0530)     LOS: 1 day     RCordelia Poche MD [facility] 10/10/2020, 8:02 AM  If 7PM-7AM, please contact night-coverage [email or web address]

## 2020-10-10 NOTE — Progress Notes (Signed)
  Echocardiogram 2D Echocardiogram has been performed.  Fidel Levy 10/10/2020, 11:02 AM

## 2020-10-10 NOTE — Progress Notes (Signed)
When this RN went to the patient's room to give the medicine. Patient was not in the room. Iv line and patient's gown was in the bed. Patient left the hospital. MD notified.

## 2020-10-10 NOTE — Progress Notes (Signed)
Per attending RN, pt is not in room upon rounding with PIV/gown in bed. I called the patient's phone number and spoke to him. Patient voiced that no one updated him on his POC and feels that he no longer needs to be in the hospital. I explained that if he leaves the hospital w/o a discharge, it is considered leaving the hospital against medical advice although it is not recommended. Patient voices that he knows and will not be coming back and he is aware that he had left AMA. I notified his attending RN and his attending MD.   Ave Filter

## 2020-10-10 NOTE — Consult Note (Signed)
Leonard [name] 1950/05/06  [id number].    Requesting MD: Alonza Bogus, PA-C Chief Complaint/Reason for Consult: Cholelithiasis   HPI: Leonard [name] is a 70 y.o. male with a hx of PAF, prior PE (not on anticoagulation), HFrEF w/ NICM (EF 30-35% w/ diffuse hypokinesis, Mod AI, Mild AR on 1/19), CAD (non-obstructive dz on cath 2018), HTN, tobacco abuse, alcohol abuse (drinks 1/2 gallon of whiskey every 3-4 days), COPD and remote hx of prostate ca s/p robotic prostatectomy who presented for epigastric abdominal pain.  Patient reports around 2 PM yesterday after drinking Coffee he began having epigastric abdominal pain radiating to his right upper quadrant with associated nausea.  He tried to self-induced emesis without relief.  He tried ginger ale and Pepto-Bismol without relief.  He notes his pain was constant and gradually worsened to a 10/10 which prompted his visit to the emergency department.  He reports that after receiving IV pain medication his pain has improved and is currently a 1/10.  No history of similar symptoms in the past. He has a hx of prior appendectomy and prostatectomy.   ROS: Review of Systems  Constitutional:  Negative for chills and fever.  Respiratory:  Negative for cough and shortness of breath.   Cardiovascular:  Negative for chest pain and leg swelling.  Gastrointestinal:  Positive for abdominal pain, nausea and vomiting. Negative for blood in stool, constipation and diarrhea.  Psychiatric/Behavioral:  Negative for substance abuse.   All other systems reviewed and are negative.  Family History  Problem Relation Age of Onset   Hypertension Sister    Cancer Neg Hx     Past Medical History:  Diagnosis Date   Asthma    CHF (congestive heart failure) ([place])    Hypertension    Prostate cancer [facility])     Past Surgical History:  Procedure Laterality Date   APPENDECTOMY     HYDROCELE EXCISION Left 12/01/2015   Procedure: REPAIR OF LEFT HYDROCELE;  Surgeon:  Raynelle Bring, MD;  Location: [facility];  Service: Urology;  Laterality: Left;   LYMPHADENECTOMY Bilateral 09/24/2013   Procedure: LYMPHADENECTOMY;  Surgeon: Raynelle Bring, MD;  Location: [facility];  Service: Urology;  Laterality: Bilateral;   PROSTATE BIOPSY     RIGHT/LEFT HEART CATH AND CORONARY ANGIOGRAPHY N/A 06/10/2016   Procedure: Right/Left Heart Cath and Coronary Angiography;  Surgeon: Jettie Booze, MD;  Location: [place] [facility];  Service: Cardiovascular;  Laterality: N/A;   ROBOT ASSISTED LAPAROSCOPIC RADICAL PROSTATECTOMY N/A 09/24/2013   Procedure: ROBOTIC ASSISTED LAPAROSCOPIC RADICAL PROSTATECTOMY LEVEL 2;  Surgeon: Raynelle Bring, MD;  Location: [facility];  Service: Urology;  Laterality: N/A;    Social History:  reports that he has been smoking cigarettes. He has a 7.50 pack-year smoking history. He has never used smokeless tobacco. He reports current alcohol use of about 6.0 - 7.0 standard drinks per week. He reports that he does not use drugs. Smokes 1 pack/day Drinks half a gallon of whiskey every 3-4 days Retired Chief Operating Officer Lives at home alone Prior marijuana use, no current drug use  Allergies:  Allergies  Allergen Reactions   Losartan Rash and Other (See Comments)    Abdominal and leg rashes noted in 12/17    Facility-Administered Medications Prior to Admission  Medication Dose Route Frequency Provider Last Rate Last Admin   triamcinolone acetonide (KENALOG) 10 MG/ML injection 10 mg  10 mg Other Once Landis Martins, DPM       Medications Prior to Admission  Medication  Sig Dispense Refill   albuterol (PROVENTIL HFA;VENTOLIN HFA) 108 (90 Base) MCG/ACT inhaler Inhale 2 puffs into the lungs every 4 (four) hours as needed for wheezing or shortness of breath. 1 Inhaler 0   albuterol (PROVENTIL) (2.5 MG/3ML) 0.083% nebulizer solution Take 3 mLs (2.5 mg total) by nebulization every 6 (six) hours as needed for wheezing or shortness of breath. 75 mL 2   amLODipine (NORVASC) 5  MG tablet Take 1 tablet by mouth daily (Patient taking differently: Take 5 mg by mouth daily.) 90 tablet 1   carvedilol (COREG) 25 MG tablet Take 1 tablet (25 mg total) by mouth 2 (two) times daily. 180 tablet 2   dorzolamide-timolol (COSOPT) 22.3-6.8 MG/ML ophthalmic solution Place 1 drop into both eyes 2 (two) times daily.     ENTRESTO 97-103 MG Take 1 tablet by mouth 2 (two) times daily.     furosemide (LASIX) 40 MG tablet Take 1 tablet (40 mg total) by mouth daily. You may take an extra tablet in the PM AS NEEDED for swelling up to twice a week 120 tablet 2   latanoprost (XALATAN) 0.005 % ophthalmic solution Place 1 drop into both eyes at bedtime.      SYMBICORT 160-4.5 MCG/ACT inhaler Inhale 1 puff into the lungs daily as needed (shortness of breath).  3     Physical Exam: Blood pressure 132/78, pulse 65, temperature 97.7 F (36.5 C), temperature source Oral, resp. rate 18, SpO2 98 %. General: pleasant, WD/WN AA male who is laying in bed in NAD HEENT: head is normocephalic, atraumatic.  Sclera are noninjected.  PERRL.  Ears and nose without any masses or lesions.  Mouth is pink and moist. Dentition fair Heart: regular, rate, and rhythm.  Normal s1,s2. No obvious murmurs, gallops, or rubs noted.  Palpable pedal pulses bilaterally  Lungs: CTAB, no wheezes, rhonchi, or rales noted.  Respiratory effort nonlabored Abd:  Soft, ND, very minimal tenderness of the epigastrium and ruq, +BS. Reducible umbilical hernia. No masses, or organomegaly MS: no BUE/BLE edema, calves soft and nontender Skin: warm and dry with no masses, lesions, or rashes Psych: A&Ox4 with an appropriate affect Neuro: cranial nerves grossly intact, equal strength in BUE/BLE bilaterally, normal speech, thought process intact, moves all extremities, gait not assessed   Results for orders placed or performed during the hospital encounter of 10/09/20 (from the past 48 hour(s))  CBC with Differential     Status: Abnormal    Collection Time: 10/09/20  3:25 PM  Result Value Ref Range   WBC 13.8 (H) 4.0 - 10.5 K/uL   RBC 3.98 (L) 4.22 - 5.81 MIL/uL   Hemoglobin 13.7 13.0 - 17.0 g/dL   HCT 39.1 39.0 - 52.0 %   MCV 98.2 80.0 - 100.0 fL   MCH 34.4 (H) 26.0 - 34.0 pg   MCHC 35.0 30.0 - 36.0 g/dL   RDW 12.8 11.5 - 15.5 %   Platelets 235 150 - 400 K/uL   nRBC 0.0 0.0 - 0.2 %   Neutrophils Relative % 80 %   Neutro Abs 10.9 (H) 1.7 - 7.7 K/uL   Lymphocytes Relative 12 %   Lymphs Abs 1.7 0.7 - 4.0 K/uL   Monocytes Relative 7 %   Monocytes Absolute 1.0 0.1 - 1.0 K/uL   Eosinophils Relative 1 %   Eosinophils Absolute 0.2 0.0 - 0.5 K/uL   Basophils Relative 0 %   Basophils Absolute 0.0 0.0 - 0.1 K/uL   Immature Granulocytes 0 %  Abs Immature Granulocytes 0.05 0.00 - 0.07 K/uL    Comment: Performed at [facility], [place] [address], [place], [place] [address]  Comprehensive metabolic panel     Status: Abnormal   Collection Time: 10/09/20  3:25 PM  Result Value Ref Range   Sodium 140 135 - 145 mmol/L   Potassium 2.6 (LL) 3.5 - 5.1 mmol/L    Comment: CRITICAL RESULT CALLED TO, READ BACK BY AND VERIFIED WITH:  CJustine Null, RN, 1628, 10/09/20, ADEDOKUNE    Chloride 97 (L) 98 - 111 mmol/L   CO2 32 22 - 32 mmol/L   Glucose, Bld 126 (H) 70 - 99 mg/dL    Comment: Glucose reference range applies only to samples taken after fasting for at least 8 hours.   BUN 13 8 - 23 mg/dL   Creatinine, Ser 1.01 0.61 - 1.24 mg/dL   Calcium 9.3 8.9 - 10.3 mg/dL   Total Protein 6.5 6.5 - 8.1 g/dL   Albumin 3.4 (L) 3.5 - 5.0 g/dL   AST 489 (H) 15 - 41 U/L   ALT 119 (H) 0 - 44 U/L   Alkaline Phosphatase 97 38 - 126 U/L   Total Bilirubin 2.7 (H) 0.3 - 1.2 mg/dL   GFR, Estimated >60 >60 mL/min    Comment: (NOTE) Calculated using the CKD-EPI Creatinine Equation (2021)    Anion gap 11 5 - 15    Comment: Performed at [facility], [place] [address], [place], [place] [address]  Lipase, blood     Status: None   Collection Time:  10/09/20  3:25 PM  Result Value Ref Range   Lipase 26 11 - 51 U/L    Comment: Performed at [facility], [place] [address] [place], [place] [address]  Urinalysis, Routine w reflex microscopic Urine, Clean Catch     Status: Abnormal   Collection Time: 10/09/20  3:25 PM  Result Value Ref Range   Color, Urine AMBER (A) YELLOW    Comment: BIOCHEMICALS MAY BE AFFECTED BY COLOR   APPearance CLEAR CLEAR   Specific Gravity, Urine 1.014 1.005 - 1.030   pH 6.0 5.0 - 8.0   Glucose, UA NEGATIVE NEGATIVE mg/dL   Hgb urine dipstick MODERATE (A) NEGATIVE   Bilirubin Urine NEGATIVE NEGATIVE   Ketones, ur NEGATIVE NEGATIVE mg/dL   Protein, ur 30 (A) NEGATIVE mg/dL   Nitrite NEGATIVE NEGATIVE   Leukocytes,Ua NEGATIVE NEGATIVE   RBC / HPF 11-20 0 - 5 RBC/hpf   WBC, UA 0-5 0 - 5 WBC/hpf   Bacteria, UA NONE SEEN NONE SEEN   Squamous Epithelial / LPF 0-5 0 - 5   Mucus PRESENT     Comment: Performed at [facility], [place] [address], [place], [place] [address]  Culture, blood (routine x 2)     Status: None (Preliminary result)   Collection Time: 10/09/20 10:35 PM   Specimen: BLOOD RIGHT FOREARM  Result Value Ref Range   Specimen Description BLOOD RIGHT FOREARM    Special Requests      BOTTLES DRAWN AEROBIC AND ANAEROBIC Blood Culture adequate volume   Culture      NO GROWTH < 12 HOURS Performed at [facility], [place] [address], [place], [place] [address]    Report Status PENDING   Culture, blood (routine x 2)     Status: None (Preliminary result)   Collection Time: 10/09/20 10:45 PM   Specimen: BLOOD LEFT HAND  Result Value Ref Range  Specimen Description BLOOD LEFT HAND    Special Requests      BOTTLES DRAWN AEROBIC AND ANAEROBIC Blood Culture adequate volume   Culture      NO GROWTH < 12 HOURS Performed at [facility], [place] [address], [place], [place] [address]    Report Status PENDING   Resp Panel by RT-PCR (Flu A&B, Covid) Nasopharyngeal Swab     Status: None    Collection Time: 10/10/20  1:30 AM   Specimen: Nasopharyngeal Swab; Nasopharyngeal(NP) swabs in vial transport medium  Result Value Ref Range   SARS Coronavirus 2 by RT PCR NEGATIVE NEGATIVE    Comment: (NOTE) SARS-CoV-2 target nucleic acids are NOT DETECTED.  The SARS-CoV-2 RNA is generally detectable in upper respiratory specimens during the acute phase of infection. The lowest concentration of SARS-CoV-2 viral copies this assay can detect is 138 copies/mL. A negative result does not preclude SARS-Cov-2 infection and should not be used as the sole basis for treatment or other patient management decisions. A negative result may occur with  improper specimen collection/handling, submission of specimen other than nasopharyngeal swab, presence of viral mutation(s) within the areas targeted by this assay, and inadequate number of viral copies(<138 copies/mL). A negative result must be combined with clinical observations, patient history, and epidemiological information. The expected result is Negative.  Fact Sheet for Patients:  EntrepreneurPulse.com.au  Fact Sheet for Healthcare Providers:  IncredibleEmployment.be  This test is no t yet approved or cleared by the [place] FDA and  has been authorized for detection and/or diagnosis of SARS-CoV-2 by FDA under an Emergency Use Authorization (EUA). This EUA will remain  in effect (meaning this test can be used) for the duration of the COVID-19 declaration under Section 564(b)(1) of the Act, 21 U.S.C.section 360bbb-3(b)(1), unless the authorization is terminated  or revoked sooner.       Influenza A by PCR NEGATIVE NEGATIVE   Influenza B by PCR NEGATIVE NEGATIVE    Comment: (NOTE) The Xpert Xpress SARS-CoV-2/FLU/RSV plus assay is intended as an aid in the diagnosis of influenza from Nasopharyngeal swab specimens and should not be used as a sole basis for treatment. Nasal washings  and aspirates are unacceptable for Xpert Xpress SARS-CoV-2/FLU/RSV testing.  Fact Sheet for Patients: EntrepreneurPulse.com.au  Fact Sheet for Healthcare Providers: IncredibleEmployment.be  This test is not yet approved or cleared by the [place] FDA and has been authorized for detection and/or diagnosis of SARS-CoV-2 by FDA under an Emergency Use Authorization (EUA). This EUA will remain in effect (meaning this test can be used) for the duration of the COVID-19 declaration under Section 564(b)(1) of the Act, 21 U.S.C. section 360bbb-3(b)(1), unless the authorization is terminated or revoked.  Performed at [facility], [place] [address] [place], [place] [address]   HIV Antibody (routine testing w rflx)     Status: None   Collection Time: 10/10/20  1:30 AM  Result Value Ref Range   HIV Screen 4th Generation wRfx Non Reactive Non Reactive    Comment: Performed at [facility], [place] [address] [place], [place] [address]  Magnesium     Status: None   Collection Time: 10/10/20  1:30 AM  Result Value Ref Range   Magnesium 1.8 1.7 - 2.4 mg/dL    Comment: Performed at [facility], [place] [address], [place], [place] [address]  Phosphorus     Status: None   Collection Time: 10/10/20  1:30 AM  Result Value Ref  Range   Phosphorus 3.5 2.5 - 4.6 mg/dL    Comment: Performed at [facility] [address] [place], [place] [address]  CBC     Status: Abnormal   Collection Time: 10/10/20  4:39 AM  Result Value Ref Range   WBC 8.9 4.0 - 10.5 K/uL   RBC 3.55 (L) 4.22 - 5.81 MIL/uL   Hemoglobin 12.0 (L) 13.0 - 17.0 g/dL   HCT 35.0 (L) 39.0 - 52.0 %   MCV 98.6 80.0 - 100.0 fL   MCH 33.8 26.0 - 34.0 pg   MCHC 34.3 30.0 - 36.0 g/dL   RDW 12.9 11.5 - 15.5 %   Platelets 196 150 - 400 K/uL   nRBC 0.0 0.0 - 0.2 %    Comment: Performed at [facility], [place] [address] [place], [place] [address]  Comprehensive metabolic panel      Status: Abnormal   Collection Time: 10/10/20  4:39 AM  Result Value Ref Range   Sodium 138 135 - 145 mmol/L   Potassium 3.5 3.5 - 5.1 mmol/L   Chloride 103 98 - 111 mmol/L   CO2 28 22 - 32 mmol/L   Glucose, Bld 84 70 - 99 mg/dL    Comment: Glucose reference range applies only to samples taken after fasting for at least 8 hours.   BUN 9 8 - 23 mg/dL   Creatinine, Ser 0.88 0.61 - 1.24 mg/dL   Calcium 8.4 (L) 8.9 - 10.3 mg/dL   Total Protein 5.2 (L) 6.5 - 8.1 g/dL   Albumin 2.6 (L) 3.5 - 5.0 g/dL   AST 339 (H) 15 - 41 U/L   ALT 164 (H) 0 - 44 U/L   Alkaline Phosphatase 96 38 - 126 U/L   Total Bilirubin 4.5 (H) 0.3 - 1.2 mg/dL   GFR, Estimated >60 >60 mL/min    Comment: (NOTE) Calculated using the CKD-EPI Creatinine Equation (2021)    Anion gap 7 5 - 15    Comment: Performed at [place] [address], [place], [place] [address]   CT ABDOMEN PELVIS W CONTRAST  Result Date: 10/09/2020 CLINICAL DATA:  70 year old male with history of abdominal distension. EXAM: CT ABDOMEN AND PELVIS WITH CONTRAST TECHNIQUE: Multidetector CT imaging of the abdomen and pelvis was performed using the standard protocol following bolus administration of intravenous contrast. CONTRAST:  142m OMNIPAQUE IOHEXOL 350 MG/ML SOLN COMPARISON:  CT of the abdomen and pelvis 08/08/2013. FINDINGS: Lower chest: Atherosclerotic calcifications in the descending thoracic aorta as well as the left anterior descending coronary artery. Hepatobiliary: Subcentimeter low-attenuation lesion in segment 2 of the liver, too small to characterize, but statistically likely represent a tiny cyst. No other larger more suspicious appearing hepatic lesions are noted. Mild intrahepatic and extrahepatic biliary ductal dilatation. Common bile duct measures up to 8 mm in the porta hepatis. In the distal common bile duct there are some subtle areas of increased density, best appreciated on coronal image 60 of series 6 and axial image 39 of  series 3, suspicious for potential choledocholithiasis. Gallbladder is moderately distended. There is again focal soft tissue thickening and either soft tissue enhancement or faint soft tissue calcification in the fundus of the gallbladder (axial image 40 of series 3, coronal image 35 of series 6 and sagittal image 34 of series 7) measuring 3.4 x 1.9 x 4.0 cm which is slightly more prominent than prior examination from 2015. Pancreas: No pancreatic mass. No pancreatic ductal dilatation. No  pancreatic or peripancreatic fluid collections or inflammatory changes. Spleen: Unremarkable. Adrenals/Urinary Tract: Several subcentimeter low-attenuation lesions in both kidneys, too small to characterize, but statistically likely represent cysts. Left-sided extrarenal pelvis (normal anatomical variant), similar to the prior study. No hydroureteronephrosis. Urinary bladder is normal in appearance. Bilateral adrenal glands are normal in appearance. Stomach/Bowel: The appearance of the stomach is normal. There is no pathologic dilatation of small bowel or colon. The appendix is not confidently identified and may be surgically absent. Regardless, there are no inflammatory changes noted adjacent to the cecum to suggest the presence of an acute appendicitis at this time. Vascular/Lymphatic: Atherosclerotic calcifications in the abdominal aorta and pelvic vasculature, without evidence of aneurysm or dissection. No lymphadenopathy noted in the abdomen or pelvis. Reproductive: Status post radical prostatectomy. Seminal vesicles are unremarkable in appearance. Other: No significant volume of ascites.  No pneumoperitoneum. Musculoskeletal: Prominent Schmorl's nodes in the superior aspect of the L5 and S1 vertebral bodies. There are no aggressive appearing lytic or blastic lesions noted in the visualized portions of the skeleton. IMPRESSION: 1. Findings are suggestive of obstructive choledocholithiasis with mild intra and extrahepatic  biliary ductal dilatation and moderate gallbladder distension. There is also an increasing mass-like density in the fundus of the gallbladder which may simply represent adenomyomatosis, however, underlying gallbladder neoplasm is difficult to entirely exclude. Further evaluation with abdominal MRI with and without IV gadolinium with MRCP is recommended at this time. 2. Aortic atherosclerosis, in addition to at least left anterior descending coronary artery disease. Assessment for potential risk factor modification, dietary therapy or pharmacologic therapy may be warranted, if clinically indicated. 3. Additional incidental findings, as above. Electronically Signed   By: Vinnie Langton M.D.   On: 10/09/2020 18:29   MR ABDOMEN MRCP W WO CONTAST  Result Date: 10/10/2020 CLINICAL DATA:  Epigastric pain, nausea/vomiting, ETOH, jaundice EXAM: MRI ABDOMEN WITHOUT AND WITH CONTRAST (INCLUDING MRCP) TECHNIQUE: Multiplanar multisequence MR imaging of the abdomen was performed both before and after the administration of intravenous contrast. Heavily T2-weighted images of the biliary and pancreatic ducts were obtained, and three-dimensional MRCP images were rendered by post processing. CONTRAST:  48m GADAVIST GADOBUTROL 1 MMOL/ML IV SOLN COMPARISON:  CT abdomen/pelvis dated 10/09/2020 and 08/08/2013. FINDINGS: Motion degraded images. Lower chest: Lung bases are clear. Hepatobiliary: Subcentimeter cyst in the left hepatic lobe (series 8/image 9). No suspicious/enhancing hepatic lesions, noting motion degradation. Layering gallstones with mild gallbladder wall edema and gallbladder distension. Early acute cholecystitis is possible, although this is equivocal. Nodular thickening of the gallbladder fundus (series 4/image 22) is chronic dating back to 2015 and favors benign gallbladder adenomyomatosis. Mild central intrahepatic ductal prominence, improved from recent CT. Common duct measures 8 mm, within normal limits, tapering  at the ampulla. Despite the CT appearance, there is no evidence of choledocholithiasis on MR. Pancreas:  Within normal limits. Spleen:  Within normal limits. Adrenals/Urinary Tract:  Adrenal glands are within normal limits. Bilateral renal cysts, measuring up to 11 mm in the left lower kidney (series 19/image 13). No hydronephrosis. Stomach/Bowel: Stomach is within normal limits. Visualized bowel is unremarkable. Vascular/Lymphatic:  No evidence of abdominal aortic aneurysm. No suspicious abdominal lymphadenopathy. Other:  No abdominal ascites. Musculoskeletal: No focal osseous lesions. IMPRESSION: Cholelithiasis with mild gallbladder wall thickening/edema and gallbladder distension. Early acute cholecystitis is possible, although equivocal. Consider hepatobiliary nuclear medicine scan as clinically warranted. Common duct measures 8 mm, within normal limits, tapering at the ampulla. No choledocholithiasis is evident on MR. Given the CT appearance, recent  passage of a tiny distal CBD stone is possible. Nodular thickening of the gallbladder fundus is chronic dating back to 2015, favoring benign gallbladder adenomyomatosis. Electronically Signed   By: Julian Hy M.D.   On: 10/10/2020 01:18    Anti-infectives (From admission, onward)    Start     Dose/Rate Route Frequency Ordered Stop   10/09/20 2130  piperacillin-tazobactam (ZOSYN) IVPB 3.375 g        3.375 g 12.5 mL/hr over 240 Minutes Intravenous Every 8 hours 10/09/20 2105         Assessment/Plan Cholelithiasis with possible acute cholecystitis  Choledocholithiasis Thickening of gallbladder fundus - Patient with epigastric/right upper quadrant abdominal pain that began yesterday around 2 PM with associated nausea and self-induced emesis.  CT A/P with choledocholithiasis and masslike density in the fundus of the gallbladder.  LFTs were noted to be elevated on admission (AST 489, ALT 119, Alk Phos 97, T. Bili 2.7). WBC 13.8. Lipase wnl. GI  consulted and MRCP performed that was suggestive of passed CBD stone w/ CBD 73m and tapers at the ampulla. The nodular thickening of the GB fundus was reported to be chronic dating back to 2015 favoring benign GB adenomyomatosis per radiology report. MRCP also showed Cholelithiasis with mild gallbladder wall thickening/edema and gallbladder distension concerning for possible Cholecystitis. WBC has normalized today at 8.9. AST 339, ALT 164, Alk Phos 96, T. Bili up at 4.5. Pain greatly improved to 1/10 with minimal to almost no tenderness on exam. He is currently on Zosyn.  I discussed with patient recommendation for laparoscopic cholecystectomy with IOC if he receives cardiac clearance.  We discussed risk of recurrent choledocholithiasis that can lead to cholangitis, sepsis and death.  We also discussed the possibility if he has cholecystitis that this could worsen despite IV antibiotics.  We discussed that although nodular thickening of the gallbladder fundus was favored to be adenomyomatosis, could not rule out other etiologies without tissue pathology. He reports that he would like to understand his cardiac risks before making a decision (he is undergoing an echo today). He reports he is favoring nonoperative therapy and understands above risks discussed. Okay for CLD. NPO at midnight. Cont iv abx and trend labs (AM labs written for). I have consulted cardiology. We will follow with you.   FEN - CLD, NPO at midnight VTE - SCDs, okay with chemical prophylaxis from general surgery standpoint ID - Zosyn  PAF (not on anticoagulation) Prior PE (not on anticoagulation) HFrEF w/ NICM (EF 30-35% w/ diffuse hypokinesis, Mod AI, Mild AR on 1/19) CAD (non-obstructive dz on cath 2018) HTN Tobacco abuse w/ COPD Alcohol abuse (drinks 1/2 gallon of whiskey every 3-4 days) Remote hx of prostate ca s/p robotic prostatectomy who presented for epigastric abdominal pain   MJillyn Ledger [facility] 10/10/2020, 11:20 AM Please see Amion for pager number during day hours 7:00am-4:30pm

## 2020-10-10 NOTE — Consult Note (Addendum)
Cardiology Consultation:   Patient ID: Victorious Kimery MRN: [id number]; DOB: [birth year]  Admit date: 10/09/2020 Date of Consult: 10/10/2020  PCP:  Finian Holms, NP   [facility] Providers Cardiologist:  [name], MD        Patient Profile:   Geraldine Alessandrini is a 70 y.o. male with a hx of HFrEF, prior cough with ACE and rash with ARB, mild-moderate nonobstructive CAD, PAF (patient declined anticoagulation), moderate AI and mild MR by echo 2019, PE 2019 (tx 6 months Xarelto), mild TAA, hypertensive heart disease, tobacco abuse, alcohol abuse, COPD, gout, CKD stage II, remote prostate CA, poor medication adherence who is being seen 10/10/2020 for the evaluation of preop evaluation at the request of Alferd Apa PA-C.  History of Present Illness:   Mr. Devan was initially evaluated in 2018 for newly recognized cardiomyopathy with EF 40-45% at that time. He underwent cardiac cath 06/2016 showing nonbstructive CAD (40% prox RCA, 25% distal RCA, 25% ramus, 20% mid LAD, LVEF 35-45%) and mild left renal artery stenosis. F/u echo 2019 showed 30-35%, cardiomyopathy felt nonischemic. He has a prior history of nonadherence to medical regimen and being lost to follow-up. He was noted to be in rate controlled atrial fib during 10/2018 ED visit. He was due to be admitted but left AMA. He was not seen by cardiology again until 04/2019. Dr. Irish Lack reviewed anticoagulation but the patient declined. Dr. Irish Lack was also concerned with compliance. The patient did not return for 3 month follow-up as recommended. He did return the clinic recently on 10/08/20 to re-establish care and saw PA Richardson Dopp. He was felt to be clinically doing well overall. The plan was to update his echocardiogram to evaluate LV function and CTA to evaluate his TAA, then see back for med optimization if needed. The patient also had an incidental cyst/lipoma in his right upper chest on exam - Scott's plan was to see what imaging showed  and refer back to PCP if this was not well characterized on the CT study. The patient indicated he was seeking out AA to try and quit drinking - has been drinking about 1/2 gallon of whiskey every 3-4 days.   In the meantime, he presented to the hospital yesterday with severe epigastric/RUQ abdominal pain and nausea. He was found to have cholelithiasis and choledocholithiasis with possible acute cholecystitis. He was placed on antibiotics and was made NPO after midnight for consideration of laparascopic cholecystectomy. Cardiology is asked to see for pre-op clearance. He feels he has been doing well from a cardiac standpoint. He denies any recent chest pain or dyspnea. He has intermittent mild edema but this is currently well controlled. He is able to walk regularly, clean the house, and wash his car without any angina or dyspnea. He has not had any palpitations, dizziness, orthopnea, or syncope. He states he takes his medicine "4 days out of 5." He is currently feeling significantly better from an abdominal standpoint and hoping to go home soon. He states he has not decided whether he wants to have gallbladder surgery. 2D echo today showed EF 60-65%, grade 1 DD, normal RV, mild MR, mild AI, mild dilation of ascending aorta (62mg). Notable labs: hypokalemia 2.6 (improved to 3.5), abnormal AST/ALT, Hgb 12.0, INR wnl.   Past Medical History:  Diagnosis Date   Alcohol abuse    Aortic insufficiency    Asthma    CAD (coronary artery disease)    a. cath 2018 with nonbstructive CAD (40% prox RCA, 25% distal  RCA, 25% ramus, 20% mid LAD.   CKD (chronic kidney disease) stage 2, GFR 60-89 ml/min    COPD (chronic obstructive pulmonary disease) (HCC)    Gout    HFrEF (heart failure with reduced ejection fraction) (HCC)    Hypertension    Mitral regurgitation    NICM (nonischemic cardiomyopathy) (HCC)    PAF (paroxysmal atrial fibrillation) (HCC)    Prostate cancer (HCC)    Renal artery stenosis (HCC)    a.  mild left renal artery stenosis by cath 2018.   Tobacco abuse     Past Surgical History:  Procedure Laterality Date   APPENDECTOMY     HYDROCELE EXCISION Left 12/01/2015   Procedure: REPAIR OF LEFT HYDROCELE;  Surgeon: Raynelle Bring, MD;  Location: [facility];  Service: Urology;  Laterality: Left;   LYMPHADENECTOMY Bilateral 09/24/2013   Procedure: LYMPHADENECTOMY;  Surgeon: Raynelle Bring, MD;  Location: [facility];  Service: Urology;  Laterality: Bilateral;   PROSTATE BIOPSY     RIGHT/LEFT HEART CATH AND CORONARY ANGIOGRAPHY N/A 06/10/2016   Procedure: Right/Left Heart Cath and Coronary Angiography;  Surgeon: Jettie Booze, MD;  Location: [facility];  Service: Cardiovascular;  Laterality: N/A;   ROBOT ASSISTED LAPAROSCOPIC RADICAL PROSTATECTOMY N/A 09/24/2013   Procedure: ROBOTIC ASSISTED LAPAROSCOPIC RADICAL PROSTATECTOMY LEVEL 2;  Surgeon: Raynelle Bring, MD;  Location: [facility];  Service: Urology;  Laterality: N/A;     Home Medications:  Prior to Admission medications   Medication Sig Start Date End Date Taking? Authorizing Provider  albuterol (PROVENTIL HFA;VENTOLIN HFA) 108 (90 Base) MCG/ACT inhaler Inhale 2 puffs into the lungs every 4 (four) hours as needed for wheezing or shortness of breath. 08/10/17  Yes Horton, Barbette Hair, MD  albuterol (PROVENTIL) (2.5 MG/3ML) 0.083% nebulizer solution Take 3 mLs (2.5 mg total) by nebulization every 6 (six) hours as needed for wheezing or shortness of breath. 01/19/16  Yes Milagros Loll, MD  amLODipine (NORVASC) 5 MG tablet Take 1 tablet by mouth daily Patient taking differently: Take 5 mg by mouth daily. 04/25/18  Yes Imogene Burn, PA-C  carvedilol (COREG) 25 MG tablet Take 1 tablet (25 mg total) by mouth 2 (two) times daily. 09/25/19  Yes Jettie Booze, MD  dorzolamide-timolol (COSOPT) 22.3-6.8 MG/ML ophthalmic solution Place 1 drop into both eyes 2 (two) times daily. 08/05/18  Yes [provider]  ENTRESTO 97-103 MG Take 1  tablet by mouth 2 (two) times daily. 10/04/18  Yes [provider]  furosemide (LASIX) 40 MG tablet Take 1 tablet (40 mg total) by mouth daily. You may take an extra tablet in the PM AS NEEDED for swelling up to twice a week 09/25/19  Yes Jettie Booze, MD  latanoprost (XALATAN) 0.005 % ophthalmic solution Place 1 drop into both eyes at bedtime.  03/04/16  Yes [provider]  SYMBICORT 160-4.5 MCG/ACT inhaler Inhale 1 puff into the lungs daily as needed (shortness of breath). 12/08/17  Yes [provider]    Inpatient Medications: Scheduled Meds:  amLODipine  5 mg Oral Daily   carvedilol  25 mg Oral BID   dorzolamide-timolol  1 drop Both Eyes BID   folic acid  1 mg Oral Daily   latanoprost  1 drop Both Eyes QHS   mometasone-formoterol  2 puff Inhalation BID   multivitamin with minerals  1 tablet Oral Daily   sacubitril-valsartan  1 tablet Oral BID   thiamine  100 mg Oral Daily   Continuous Infusions:  lactated ringers 125 mL/hr at 10/10/20 0125   piperacillin-tazobactam (ZOSYN)  IV 3.375 g (10/10/20 0916)   PRN Meds: albuterol, HYDROmorphone (DILAUDID) injection, LORazepam **OR** LORazepam, ondansetron **OR** ondansetron (ZOFRAN) IV  Allergies:    Allergies  Allergen Reactions   Losartan Rash and Other (See Comments)    Abdominal and leg rashes noted in 12/17    Social History:   Social History   Socioeconomic History   Marital status: Single    Spouse name: Not on file   Number of children: 0   Years of education: 12   Highest education level: 12th grade  Occupational History   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  Tobacco Use   Smoking status: Every Day    Packs/day: 0.25    Years: 30.00    Pack years: 7.50    Types: Cigarettes    Last attempt to quit: 12/24/2015    Years since quitting: 4.8   Smokeless tobacco: Never   Tobacco comments:    STtses he has cut down from 2  PPD to 4 cigarettes per day  Vaping Use   Vaping Use: Never used  Substance and Sexual Activity   Alcohol use: Yes    Alcohol/week: 6.0 - 7.0 standard drinks    Types: 6 - 7 Shots of liquor per week    Comment: states 6-7 shots of liquor daily   Drug use: No    Types: Marijuana   Sexual activity: Not Currently  Other Topics Concern   Not on file  Social History Narrative   Not on file   Social Determinants of Health   Financial Resource Strain: Low Risk    Difficulty of Paying Living Expenses: Not hard at all  Food Insecurity: No Food Insecurity   Worried About Charity fundraiser in the Last Year: Never true   Ran Out of Food in the Last Year: Never true  Transportation Needs: No Transportation Needs   Lack of Transportation (Medical): No   Lack of Transportation (Non-Medical): No  Physical Activity: Inactive   Days of Exercise per Week: 0 days   Minutes of Exercise per Session: 0 min  Stress: No Stress Concern Present   Feeling of Stress : Only a little  Social Connections: Unknown   Frequency of Communication with Friends and Family: More than three times a week   Frequency of Social Gatherings with Friends and Family: More than three times a week   Attends Religious Services: More than 4 times per year   Active Member of [employer] or Organizations: Yes   Attends Music therapist: More than 4 times per year   Marital Status: Not on file  Intimate Partner Violence: Not At Risk   Fear of Current or Ex-Partner: No   Emotionally Abused: No   Physically Abused: No   Sexually Abused: No    Family History:   Family History  Problem Relation Age of Onset   Hypertension Sister    Cancer Neg Hx      ROS:  Please see the history of present illness.  All other ROS reviewed and negative.     Physical Exam/Data:   Vitals:   10/10/20 0618 10/10/20 0657 10/10/20 0808 10/10/20 1210  BP: 101/84 (!) 142/78 132/78 (!) 148/72  Pulse: 64 66 65 64  Resp: '16 17 18 18   '$ Temp: 98.2 F (36.8 C) 97.7 F (36.5 C) 97.7 F (  36.5 C) 98 F (36.7 C)  TempSrc: Oral Oral Oral Oral  SpO2: 97% 92% 98% 93%    Intake/Output Summary (Last 24 hours) at 10/10/2020 1353 Last data filed at 10/10/2020 [id number] Gross per 24 hour  Intake 200 ml  Output --  Net 200 ml   Last 3 Weights 10/08/2020 04/17/2019 07/13/2018  Weight (lbs) 173 lb 12.8 oz 192 lb 187 lb  Weight (kg) 78.835 kg 87.091 kg 84.823 kg     There is no height or weight on file to calculate BMI.  General: Well developed, well nourished AAM, in no acute distress. Head: Normocephalic, atraumatic, sclera non-icteric, no xanthomas, nares are without discharge. Neck: Negative for carotid bruits. JVP not elevated. Lungs: Clear bilaterally to auscultation without wheezes, rales, or rhonchi. Breathing is unlabored. Heart: RRR S1 S2 without murmurs, rubs, or gallops.  Abdomen: Soft, non-tender, non-distended with normoactive bowel sounds. No rebound/guarding. Extremities: No clubbing or cyanosis. No edema. Distal pedal pulses are 2+ and equal bilaterally. Neuro: Alert and oriented X 3. Moves all extremities spontaneously. Psych:  Responds to questions appropriately with a normal affect.  EKG:  The EKG was personally reviewed and demonstrates:  NSR 76bpm, no acute STTW changes   Telemetry:  Telemetry was personally reviewed and demonstrates:  N/A - not on telemetry  Relevant CV Studies:  2D echo 10/10/20   1. Left ventricular ejection fraction, by estimation, is 60 to 65%. The  left ventricle has normal function. The left ventricle has no regional  wall motion abnormalities. Left ventricular diastolic parameters are  consistent with Grade I diastolic  dysfunction (impaired relaxation).   2. Right ventricular systolic function is normal. The right ventricular  size is normal.   3. The mitral valve is grossly normal. Mild mitral valve regurgitation.  No evidence of mitral stenosis.   4. The aortic valve was not well  visualized. Aortic valve regurgitation  is mild. No aortic stenosis is present.   5. Aortic dilatation noted. There is mild dilatation of the ascending  aorta, measuring 40 mm.   6. The inferior vena cava is normal in size with greater than 50%  respiratory variability, suggesting right atrial pressure of 3 mmHg.   Comparison(s): A prior study was performed on 02/24/17. LVEF has improved.  2D echo 02/2017 - Left ventricle: The cavity size was mildly dilated. There was    mild concentric hypertrophy. Systolic function was moderately to    severely reduced. The estimated ejection fraction was in the    range of 30% to 35%. Diffuse hypokinesis. Doppler parameters are    consistent with abnormal left ventricular relaxation (grade 1    diastolic dysfunction). There was no evidence of elevated    ventricular filling pressure by Doppler parameters.  - Aortic valve: There was moderate regurgitation.  - Mitral valve: There was mild regurgitation.  - Left atrium: The atrium was moderately dilated.  - Right ventricle: The cavity size was normal. Wall thickness was    normal. Systolic function was normal.  - Right atrium: The atrium was normal in size.  - Tricuspid valve: There was trivial regurgitation.  - Pulmonary arteries: Systolic pressure was within the normal    range.  - Inferior vena cava: The vessel was normal in size.  - Pericardium, extracardiac: There was no pericardial effusion.   Impressions:   - Since the last study on 05/06/16 LVEF has further decreased from    40-45% to 30-25% with diffuse hypokinesis.    [facility] 06/2016  Prox RCA lesion, 40 %stenosed. Dist RCA lesion, 25 %stenosed. Ramus lesion, 25 %stenosed. Mid LAD lesion, 20 %stenosed. There is moderate left ventricular systolic dysfunction. LV end diastolic pressure is mildly elevated. The left ventricular ejection fraction is 35-45% by visual estimate. There is no aortic valve stenosis. Ao sat 93%, PA sat 61%. CO 4.5  L/min. CI 2.2. Normal pulmonary artery pressures. No AAA. Mild left renal artery stenosis. No right renal artery stenosis.   Nonobstructive coronary artery disease.  Patient with nonischemic cardiomyopathy, likely related to elevated blood pressure.   He needs aggressive BP control.  Increase coreg to 6.25 mg BID.  Will have him f/u in our HTN clinic with electrolytes to be checked.   Laboratory Data:  High Sensitivity Troponin:  No results for input(s): TROPONINIHS in the last 720 hours.   Chemistry Recent Labs  Lab 10/09/20 1525 10/10/20 0439  NA 140 138  K 2.6* 3.5  CL 97* 103  CO2 32 28  GLUCOSE 126* 84  BUN 13 9  CREATININE 1.01 0.88  CALCIUM 9.3 8.4*  GFRNONAA >60 >60  ANIONGAP 11 7    Recent Labs  Lab 10/09/20 1525 10/10/20 0439  PROT 6.5 5.2*  ALBUMIN 3.4* 2.6*  AST 489* 339*  ALT 119* 164*  ALKPHOS 97 96  BILITOT 2.7* 4.5*   Hematology Recent Labs  Lab 10/09/20 1525 10/10/20 0439  WBC 13.8* 8.9  RBC 3.98* 3.55*  HGB 13.7 12.0*  HCT 39.1 35.0*  MCV 98.2 98.6  MCH 34.4* 33.8  MCHC 35.0 34.3  RDW 12.8 12.9  PLT 235 196   BNPNo results for input(s): BNP, PROBNP in the last 168 hours.  DDimer No results for input(s): DDIMER in the last 168 hours.   Radiology/Studies:  CT ABDOMEN PELVIS W CONTRAST  Result Date: 10/09/2020 CLINICAL DATA:  70 year old male with history of abdominal distension. EXAM: CT ABDOMEN AND PELVIS WITH CONTRAST TECHNIQUE: Multidetector CT imaging of the abdomen and pelvis was performed using the standard protocol following bolus administration of intravenous contrast. CONTRAST:  184m OMNIPAQUE IOHEXOL 350 MG/ML SOLN COMPARISON:  CT of the abdomen and pelvis 08/08/2013. FINDINGS: Lower chest: Atherosclerotic calcifications in the descending thoracic aorta as well as the left anterior descending coronary artery. Hepatobiliary: Subcentimeter low-attenuation lesion in segment 2 of the liver, too small to characterize, but statistically  likely represent a tiny cyst. No other larger more suspicious appearing hepatic lesions are noted. Mild intrahepatic and extrahepatic biliary ductal dilatation. Common bile duct measures up to 8 mm in the porta hepatis. In the distal common bile duct there are some subtle areas of increased density, best appreciated on coronal image 60 of series 6 and axial image 39 of series 3, suspicious for potential choledocholithiasis. Gallbladder is moderately distended. There is again focal soft tissue thickening and either soft tissue enhancement or faint soft tissue calcification in the fundus of the gallbladder (axial image 40 of series 3, coronal image 35 of series 6 and sagittal image 34 of series 7) measuring 3.4 x 1.9 x 4.0 cm which is slightly more prominent than prior examination from 2015. Pancreas: No pancreatic mass. No pancreatic ductal dilatation. No pancreatic or peripancreatic fluid collections or inflammatory changes. Spleen: Unremarkable. Adrenals/Urinary Tract: Several subcentimeter low-attenuation lesions in both kidneys, too small to characterize, but statistically likely represent cysts. Left-sided extrarenal pelvis (normal anatomical variant), similar to the prior study. No hydroureteronephrosis. Urinary bladder is normal in appearance. Bilateral adrenal glands are normal in appearance. Stomach/Bowel: The appearance  of the stomach is normal. There is no pathologic dilatation of small bowel or colon. The appendix is not confidently identified and may be surgically absent. Regardless, there are no inflammatory changes noted adjacent to the cecum to suggest the presence of an acute appendicitis at this time. Vascular/Lymphatic: Atherosclerotic calcifications in the abdominal aorta and pelvic vasculature, without evidence of aneurysm or dissection. No lymphadenopathy noted in the abdomen or pelvis. Reproductive: Status post radical prostatectomy. Seminal vesicles are unremarkable in appearance. Other: No  significant volume of ascites.  No pneumoperitoneum. Musculoskeletal: Prominent Schmorl's nodes in the superior aspect of the L5 and S1 vertebral bodies. There are no aggressive appearing lytic or blastic lesions noted in the visualized portions of the skeleton. IMPRESSION: 1. Findings are suggestive of obstructive choledocholithiasis with mild intra and extrahepatic biliary ductal dilatation and moderate gallbladder distension. There is also an increasing mass-like density in the fundus of the gallbladder which may simply represent adenomyomatosis, however, underlying gallbladder neoplasm is difficult to entirely exclude. Further evaluation with abdominal MRI with and without IV gadolinium with MRCP is recommended at this time. 2. Aortic atherosclerosis, in addition to at least left anterior descending coronary artery disease. Assessment for potential risk factor modification, dietary therapy or pharmacologic therapy may be warranted, if clinically indicated. 3. Additional incidental findings, as above. Electronically Signed   By: Vinnie Langton M.D.   On: 10/09/2020 18:29   MR ABDOMEN MRCP W WO CONTAST  Result Date: 10/10/2020 CLINICAL DATA:  Epigastric pain, nausea/vomiting, ETOH, jaundice EXAM: MRI ABDOMEN WITHOUT AND WITH CONTRAST (INCLUDING MRCP) TECHNIQUE: Multiplanar multisequence MR imaging of the abdomen was performed both before and after the administration of intravenous contrast. Heavily T2-weighted images of the biliary and pancreatic ducts were obtained, and three-dimensional MRCP images were rendered by post processing. CONTRAST:  29m GADAVIST GADOBUTROL 1 MMOL/ML IV SOLN COMPARISON:  CT abdomen/pelvis dated 10/09/2020 and 08/08/2013. FINDINGS: Motion degraded images. Lower chest: Lung bases are clear. Hepatobiliary: Subcentimeter cyst in the left hepatic lobe (series 8/image 9). No suspicious/enhancing hepatic lesions, noting motion degradation. Layering gallstones with mild gallbladder wall  edema and gallbladder distension. Early acute cholecystitis is possible, although this is equivocal. Nodular thickening of the gallbladder fundus (series 4/image 22) is chronic dating back to 2015 and favors benign gallbladder adenomyomatosis. Mild central intrahepatic ductal prominence, improved from recent CT. Common duct measures 8 mm, within normal limits, tapering at the ampulla. Despite the CT appearance, there is no evidence of choledocholithiasis on MR. Pancreas:  Within normal limits. Spleen:  Within normal limits. Adrenals/Urinary Tract:  Adrenal glands are within normal limits. Bilateral renal cysts, measuring up to 11 mm in the left lower kidney (series 19/image 13). No hydronephrosis. Stomach/Bowel: Stomach is within normal limits. Visualized bowel is unremarkable. Vascular/Lymphatic:  No evidence of abdominal aortic aneurysm. No suspicious abdominal lymphadenopathy. Other:  No abdominal ascites. Musculoskeletal: No focal osseous lesions. IMPRESSION: Cholelithiasis with mild gallbladder wall thickening/edema and gallbladder distension. Early acute cholecystitis is possible, although equivocal. Consider hepatobiliary nuclear medicine scan as clinically warranted. Common duct measures 8 mm, within normal limits, tapering at the ampulla. No choledocholithiasis is evident on MR. Given the CT appearance, recent passage of a tiny distal CBD stone is possible. Nodular thickening of the gallbladder fundus is chronic dating back to 2015, favoring benign gallbladder adenomyomatosis. Electronically Signed   By: SJulian HyM.D.   On: 10/10/2020 01:18   ECHOCARDIOGRAM COMPLETE  Result Date: 10/10/2020    ECHOCARDIOGRAM REPORT   Patient Name:  [name] Date of Exam: 10/10/2020 Medical Rec #:  [id number]      Height:       67.0 in Accession #:    [id number]     Weight:       173.8 lb Date of Birth:  12-22-1950      BSA:          1.905 m Patient Age:    64 years       BP:           142/78 mmHg Patient  Gender: M              HR:           76 bpm. Exam Location:  Inpatient Procedure: 2D Echo, Cardiac Doppler and Color Doppler Indications:    Cardiomyopathy-Unspecified I42.9  History:        Patient has prior history of Echocardiogram examinations, most                 recent 02/24/2017. CHF; Risk Factors:Hypertension.  Sonographer:    Bernadene Person RDCS Referring Phys: [place]  1. Left ventricular ejection fraction, by estimation, is 60 to 65%. The left ventricle has normal function. The left ventricle has no regional wall motion abnormalities. Left ventricular diastolic parameters are consistent with Grade I diastolic dysfunction (impaired relaxation).  2. Right ventricular systolic function is normal. The right ventricular size is normal.  3. The mitral valve is grossly normal. Mild mitral valve regurgitation. No evidence of mitral stenosis.  4. The aortic valve was not well visualized. Aortic valve regurgitation is mild. No aortic stenosis is present.  5. Aortic dilatation noted. There is mild dilatation of the ascending aorta, measuring 40 mm.  6. The inferior vena cava is normal in size with greater than 50% respiratory variability, suggesting right atrial pressure of 3 mmHg. Comparison(s): A prior study was performed on 02/24/17. LVEF has improved. FINDINGS  Left Ventricle: Left ventricular ejection fraction, by estimation, is 60 to 65%. The left ventricle has normal function. The left ventricle has no regional wall motion abnormalities. The left ventricular internal cavity size was normal in size. There is  no left ventricular hypertrophy. Left ventricular diastolic parameters are consistent with Grade I diastolic dysfunction (impaired relaxation). Right Ventricle: The right ventricular size is normal. No increase in right ventricular wall thickness. Right ventricular systolic function is normal. Left Atrium: Left atrial size was normal in size. Right Atrium: Right atrial size was  normal in size. Pericardium: There is no evidence of pericardial effusion. Mitral Valve: The mitral valve is grossly normal. Mild mitral valve regurgitation. No evidence of mitral valve stenosis. Tricuspid Valve: The tricuspid valve is normal in structure. Tricuspid valve regurgitation is not demonstrated. Aortic Valve: The aortic valve was not well visualized. Aortic valve regurgitation is mild. Aortic regurgitation PHT measures 713 msec. No aortic stenosis is present. Pulmonic Valve: The pulmonic valve was not well visualized. Pulmonic valve regurgitation is not visualized. No evidence of pulmonic stenosis. Aorta: The aortic root is normal in size and structure and aortic dilatation noted. There is mild dilatation of the ascending aorta, measuring 40 mm. Venous: The inferior vena cava is normal in size with greater than 50% respiratory variability, suggesting right atrial pressure of 3 mmHg. IAS/Shunts: The atrial septum is grossly normal.  LEFT VENTRICLE PLAX 2D LVIDd:         4.70 cm  Diastology LVIDs:         3.00  cm  LV e' medial:    8.31 cm/s LV PW:         1.00 cm  LV E/e' medial:  13.4 LV IVS:        0.90 cm  LV e' lateral:   9.43 cm/s LVOT diam:     2.00 cm  LV E/e' lateral: 11.8 LV SV:         73 LV SV Index:   38 LVOT Area:     3.14 cm  RIGHT VENTRICLE RV S prime:     10.10 cm/s TAPSE (M-mode): 2.5 cm LEFT ATRIUM             Index       RIGHT ATRIUM           Index LA diam:        4.20 cm 2.20 cm/m  RA Area:     13.00 cm LA Vol (A2C):   38.3 ml 20.10 ml/m RA Volume:   27.90 ml  14.64 ml/m LA Vol (A4C):   33.0 ml 17.32 ml/m LA Biplane Vol: 37.8 ml 19.84 ml/m  AORTIC VALVE LVOT Vmax:   105.00 cm/s LVOT Vmean:  65.100 cm/s LVOT VTI:    0.233 m AI PHT:      713 msec  AORTA Ao Root diam: 3.40 cm Ao Asc diam:  4.00 cm MITRAL VALVE MV Area (PHT): 3.85 cm     SHUNTS MV Decel Time: 197 msec     Systemic VTI:  0.23 m MV E velocity: 111.00 cm/s  Systemic Diam: 2.00 cm MV A velocity: 76.00 cm/s MV E/A ratio:   1.46 Rudean Haskell MD Electronically signed by Rudean Haskell MD Signature Date/Time: 10/10/2020/12:54:37 PM    Final      Assessment and Plan:   1. Pre-op cardiovascular evaluation - RCRI is 0.9% inclusive of CHF indicating generally low risk of CV complications - furthermore, he is able to complete over 4 METS walking briskly, washing his car, and doing housework without any recent functional limitation or angina - LVEF has normalized by echocardiogram today and valvular disease remains mild - at present time, do not see any overt contraindications prohibiting surgical clearance but will discuss with Dr. Percival Spanish  2. History of HFmREF / NICM felt due to HTN - LVEF has normalized  - I cancelled OP echo 11/03/20 since he had this done inpatient and included reminder FYI on AVS - continue home regimen to include carvedilol '25mg'$  BID, Entresto 97/'103mg'$  BID, Lasix '40mg'$  daily, amlodipine '5mg'$  daily - will start KCl 28mq daily given hypokalemia requiring supplementation this admission - if he requires adjustment in his diuretic dose, will need adjustment in potassium as well - appears euvolemic  3. Nonobstructive CAD, aortic atherosclerosis - no ischemic symptoms - revisit statin as OP when LFTs normalize - not on ASA as outpatient which seems reasonable given heavy ETOH use  4. History of paroxysmal atrial fibrillation - not on anticoagulation as patient declined and there were concerns for compliance - remains in NSR - notify cardiology for any post-op arrhythmias  5. Mild aneurysmal dilation of the ascending thoracic aorta - 4cm by CT 2019, was pending outpatient CT - by 2D echo today, he had mild dilation of ascending aorta of 429m- per d/w Dr. HoPercival Spanishwill also cancel the outpatient CTA pending to evaluate size - can revisit ordering for 1 year follow-up going forward - I also included this FYI on his AVS, along with recommendation to f/u primary  care for the soft tissue  cyst/lipoma noted recently  6. ETOH/tobacco abuse - cessation advised - patient seems highly motivated to attend AA - on CIWA precautions here  Risk Assessment/Risk Scores:      New York Heart Association (NYHA) Functional Class NYHA Class I   For questions or updates, please contact [place] Please consult [email or web address] for contact info under    Signed, Charlie Pitter, PA-C  10/10/2020 1:53 PM  History and all data above reviewed.  Patient examined.  I agree with the findings as above.    The patient has a history of reduced EF and non obstructive CAD.    Absolutely no symptoms recently.  He is active and cleans his care routinely for exercise.  He walks.  He hangs out with his three grandchildren.    The patient exam reveals COR:RRR  ,  Lungs: Clear  ,  Abd: Positive bowel sounds, no rebound no guarding, Ext   No edema   .  All available labs, radiology testing, previous records reviewed. Agree with documented assessment and plan.   Preop:   According to ACC/AHA guidelines he has no high risk features for surgery.  No further work up.  However, the patient declines surgery.   CM:  EF is now normal.  Continue meds as listed.  It was suggested that he might need to start spironolactone and Farxiga but with NL EF he does not want to start new meds and I think this is reasonable.  Of note we cancelled the out patient echo and CT.  He does have some mild aortic enlargement on the echo but I think this can be follow up with a CT in one year.    Jeneen Rinks Kisean Rollo  4:04 PM  10/10/2020

## 2020-10-14 LAB — CULTURE, BLOOD (ROUTINE X 2)
Culture: NO GROWTH
Culture: NO GROWTH
Special Requests: ADEQUATE
Special Requests: ADEQUATE

## 2020-10-15 ENCOUNTER — Other Ambulatory Visit: Payer: Self-pay | Admitting: *Deleted

## 2020-10-15 NOTE — Patient Outreach (Signed)
[facility]) Care Management  10/15/2020  Cayton Dafoe 1951-01-03 [id number]   CSW spoke with pt who reports he was in the hospital. "They wanted to take out my pancreas but I don't want surgery". Pt is hoping to "pass the gallstones and be alright".  CSW validated his wishes and encouraged pt to discuss long term options with his decision with hie Providers.  CSW asked pt about his drinking etoh- "I'm still drinking.... I know I need to stop". Pt reports having a gout flare up and "didn't drink for 4 or 5 days".  CSW applauded pt for this and encouraged him to rethink his drinking. He has not called his Insurance provider as previously discussed and planned- states he will do so today. Stressed to pt he has to make the steps to getting help if he wants help- pt appreciative of call and provided with contact # for callback if needed prior to CSW follow up call in 3-4 weeks.   Eduard Clos, MSW, [place] Worker  [place] [phone]

## 2020-10-29 ENCOUNTER — Other Ambulatory Visit: Payer: Medicare Other

## 2020-11-03 ENCOUNTER — Other Ambulatory Visit (HOSPITAL_COMMUNITY): Payer: Medicare Other

## 2020-11-11 ENCOUNTER — Other Ambulatory Visit: Payer: Medicare Other | Admitting: *Deleted

## 2020-11-11 ENCOUNTER — Telehealth: Payer: Medicare Other | Admitting: *Deleted

## 2020-11-11 NOTE — Patient Outreach (Signed)
[facility]) Care Management  11/11/2020  Astor Gentle 06-05-1950 [id number]   CSW spoke with pt today who reports he has not called his Insurance company yet to inquire about in-network substance dependency residential programs. "I am going to see my primary care doctor tomorrow and was Seabron Spates ask her".  CSW reminded pt that he needs to take the steps himself to call his insurance customer service line and request this information.  CSW strongly encouraged him to do this asap so he can tell his PCP he has begun the steps to getting help.  Pt appreciative of the "motivation talk" and admits; "I want to get help".    Pt also shared he has gotten a new apartment that he likes a lot. He has made new friends there (and they don't drink from what he knows/sees).  CSW will touch base with pt again in the next 2 weeks for updates on this process and reminded him to call if needs arise prior.  Eduard Clos, MSW, [place] Worker  [place] [phone]

## 2020-11-18 NOTE — Progress Notes (Signed)
Cardiology Office Note:    Date:  11/19/2020   ID:  Leonard [name], DOB 11-Mar-1950, MRN [id number]  PCP:  Leonard Lei, MD   [facility] Providers Cardiologist:  Leonard Grooms, MD     Referring MD: Leonard Holms, NP   Chief Complaint:  F/u for CHF, thoracic aneurysm     Patient Profile:   Leonard [name] is a 70 y.o. male with:  HFimpEF (heart failure with improved ejection fraction)  Non-ischemic cardiomyopathy  Echocardiogram 1/19: EF 30-35, Gr 1 DD, Mod AI, mild MR Hx of cough w/ ACE and rash w/ ARB Echocardiogram 9/22: EF 60-65, Gr 1 DD, mild MR, mild AI  Coronary artery disease  Mild to mod non-obstructive dz on cath in 2018 Paroxysmal atrial fibrillation  Documented in 2020 At OV in 3/21 pt declined anticoagulation  Aortic insufficiency  Mod by echocardiogram in 2019 Mild mitral regurgitation (echocardiogram 2019) Hx of pulmonary embolism in 02/2017 Completed 6 mos Rivaroxaban Ascending thoracic aortic aneurysm CT 2019: 4 cm  Hypertensive heart disease Chronic kidney disease  +Cigs +ETOH COPD Gout Prostate CA Hx of poor medication adherence   Prior CV studies: Echocardiogram 10/10/20 EF 60-65, no RWMA, GR 1 DD, normal RVSF, mild MR, mild AI, mild dilation of ascending aorta (40 mm)  Chest CTA 02/28/2017 IMPRESSION: 1. Acute pulmonary embolism within the right middle lobe lateral segmental artery. No right heart strain. 2. Mild aneurysmal dilatation of the ascending thoracic aorta, measuring 4.0 cm. Recommend annual imaging followup by CTA or MRA. This recommendation follows 2010 ACCF/AHA/AATS/ACR/ASA/SCA/SCAI/SIR/STS/SVM Guidelines for the Diagnosis and Management of Patients with Thoracic Aortic Disease. Circulation. 2010; 121: P382-N053 3. Moderate central peribronchial thickening, likely smoking-related. 4.  Emphysema (ICD10-J43.9).   Aortic aneurysm NOS (ICD10-I71.9).   RIGHT/LEFT HEART CATH AND CORONARY ANGIOGRAPHY 06/10/2016 Narrative   Prox RCA lesion, 40 %stenosed.  Dist RCA lesion, 25 %stenosed.  Ramus lesion, 25 %stenosed.  Mid LAD lesion, 20 %stenosed.  EF 35-45%   No AAA. Mild left renal artery stenosis. No right renal artery stenosis. Nonobstructive coronary artery disease.  Patient with nonischemic cardiomyopathy, likely related to elevated blood pressure.   ECHOCARDIOGRAM 02/24/17 Mild conc LVH, EF 30-35, diff HK, Gr 1 DD, mod AI, mild MR, mod LAE, normal RVSF, trivial TR   History of Present Illness: Leonard [name] was last seen in 8/22.  I set him up for a f/u echocardiogram and CTA to re-eval his thoracic aortic aneurysm.  However, he was admitted 10/09/20 with choledocholithiasis.  He was seen by Cardiology.  A f/u echocardiogram demonstrated improved LVF with EF 60-65.  He was noted to have mild dilation of the aorta and OP CT was canceled with plans to get the CT in 1 year.  The pt was seen by surgery and was to undergo lap chole, but the patient left AMA.  He returns for f/u.  He is here alone.  He has not had any further abdominal pain.  He saw primary care last week and has another appointment next week.  He has not had chest pain, shortness of breath, syncope, orthopnea, leg edema.  He has a lot of foot problems and walks with a cane.  He continues to smoke.  He drinks 10 shots of liquor a day.  He plans to get help soon to stop drinking.        Past Medical History:  Diagnosis Date   Alcohol abuse    Aortic insufficiency    Asthma    CAD (coronary artery  disease)    a. cath 2018 with nonbstructive CAD (40% prox RCA, 25% distal RCA, 25% ramus, 20% mid LAD.   CKD (chronic kidney disease) stage 2, GFR 60-89 ml/min    COPD (chronic obstructive pulmonary disease) (HCC)    Gout    HFrEF (heart failure with reduced ejection fraction) (HCC)    Hypertension    Mitral regurgitation    NICM (nonischemic cardiomyopathy) (HCC)    PAF (paroxysmal atrial fibrillation) (HCC)    Prostate cancer (HCC)    Renal artery  stenosis (HCC)    a. mild left renal artery stenosis by cath 2018.   Tobacco abuse    Current Medications: Current Meds  Medication Sig   albuterol (PROVENTIL HFA;VENTOLIN HFA) 108 (90 Base) MCG/ACT inhaler Inhale 2 puffs into the lungs every 4 (four) hours as needed for wheezing or shortness of breath.   albuterol (PROVENTIL) (2.5 MG/3ML) 0.083% nebulizer solution Take 3 mLs (2.5 mg total) by nebulization every 6 (six) hours as needed for wheezing or shortness of breath.   amLODipine (NORVASC) 5 MG tablet Take 1 tablet by mouth daily   carvedilol (COREG) 25 MG tablet Take 1 tablet (25 mg total) by mouth 2 (two) times daily.   dorzolamide-timolol (COSOPT) 22.3-6.8 MG/ML ophthalmic solution Place 1 drop into both eyes 2 (two) times daily.   ENTRESTO 97-103 MG Take 1 tablet by mouth 2 (two) times daily.   furosemide (LASIX) 40 MG tablet Take 1 tablet (40 mg total) by mouth daily. You may take an extra tablet in the PM AS NEEDED for swelling up to twice a week   latanoprost (XALATAN) 0.005 % ophthalmic solution Place 1 drop into both eyes at bedtime.    SYMBICORT 160-4.5 MCG/ACT inhaler Inhale 1 puff into the lungs daily as needed (shortness of breath).   Current Facility-Administered Medications for the 11/19/20 encounter (Office Visit) with Leonard Shi, PA-C  Medication   triamcinolone acetonide (KENALOG) 10 MG/ML injection 10 mg    Allergies:   Losartan   Social History   Tobacco Use   Smoking status: Every Day    Packs/day: 0.25    Years: 30.00    Pack years: 7.50    Types: Cigarettes    Last attempt to quit: 12/24/2015    Years since quitting: 4.9   Smokeless tobacco: Never   Tobacco comments:    STtses he has cut down from 2 PPD to 4 cigarettes per day  Vaping Use   Vaping Use: Never used  Substance Use Topics   Alcohol use: Yes    Alcohol/week: 6.0 - 7.0 standard drinks    Types: 6 - 7 Shots of liquor per week    Comment: states 6-7 shots of liquor daily   Drug use:  No    Types: Marijuana    Family Hx: The patient's family history includes Hypertension in his sister. There is no history of Cancer.  ROS see HPI  EKGs/Labs/Other Test Reviewed:    EKG:  EKG is notn ordered today.  The ekg ordered today demonstrates /a  Recent Labs: 10/10/2020: ALT 164; BUN 9; Creatinine, Ser 0.88; Hemoglobin 12.0; Magnesium 1.8; Platelets 196; Potassium 3.5; Sodium 138   Recent Lipid Panel Lab Results  Component Value Date/Time   CHOL 183 10/18/2017 08:43 AM   TRIG 79 10/18/2017 08:43 AM   HDL 84 10/18/2017 08:43 AM   LDLCALC 83 10/18/2017 08:43 AM   Labs obtained through [place] - personally reviewed and interpreted: 11/12/2020: Total  cholesterol 156, HDL 61, LDL 79, triglycerides 81, A1c 4.5, hemoglobin 13.6, creatinine 0.86, K+ 3.3, ALT 12   Risk Assessment/Calculations:    CHA2DS2-VASc Score = 4   This indicates a 4.8% annual risk of stroke. The patient's score is based upon: CHF History: 1 HTN History: 1 Diabetes History: 0 Stroke History: 0 Vascular Disease History: 1 Age Score: 1 Gender Score: 0         Physical Exam:    VS:  BP 122/68   Pulse 84   Ht 5\' 7"  (1.702 m)   Wt 157 lb 9.6 oz (71.5 kg)   SpO2 98%   BMI 24.68 kg/m     Wt Readings from Last 3 Encounters:  11/19/20 157 lb 9.6 oz (71.5 kg)  10/08/20 173 lb 12.8 oz (78.8 kg)  04/17/19 192 lb (87.1 kg)    Constitutional:      Appearance: Healthy appearance. Not in distress.  Neck:     Vascular: JVD normal.  Pulmonary:     Effort: Pulmonary effort is normal.     Breath sounds: No wheezing. No rales.  Cardiovascular:     Normal rate. Regular rhythm. Normal S1. Normal S2.      Murmurs: There is no murmur.  Edema:    Peripheral edema absent.  Abdominal:     Palpations: Abdomen is soft.     Tenderness: There is no abdominal tenderness.  Skin:    General: Skin is warm and dry.  Neurological:     General: No focal deficit present.     Mental Status: Alert and oriented  to person, place and time.     Cranial Nerves: Cranial nerves are intact.         ASSESSMENT & PLAN:   1. HFimpEF (heart failure with improved EF) Ejection fraction has improved to normal by echocardiogram 10/10/2020.  Nonischemic cardiomyopathy.  NYHA II.  Volume status stable.  Continue carvedilol 25 mg twice daily, Entresto 97/23 mg twice daily, furosemide 40 mg daily.  Follow-up in 6 months.  2. Coronary artery disease involving native coronary artery of native heart without angina pectoris Nonobstructive disease by cardiac catheterization in 2018.  He is not having anginal symptoms.  He is not on statin therapy.  Given his alcohol use and recent elevated LFTs in the setting of choledocholithiasis, I will hold off on starting anything at this time.  We can revisit this in the future.  His most recent LDL was fairly close to optimal without medical therapy.  3. Hypertensive heart disease with chronic systolic congestive heart failure ([place]) Blood pressures are well controlled.  Continue amlodipine 5 mg daily, carvedilol 25 mg twice daily, Entresto 97/103 mg twice daily.  4. PAF (paroxysmal atrial fibrillation) ([place]) He has declined anticoagulation in the past.  5. CKD (chronic kidney disease) stage 2, GFR 60-89 ml/min Recent creatinine normal.  6. Nonrheumatic aortic valve insufficiency Mild by recent echocardiogram.  7. Tobacco abuse I have recommended cessation.  8. Calculus of bile duct with acute cholecystitis and obstruction I have asked him to follow-up with primary care.  9. Aneurysm of ascending aorta without rupture 40 mm by Echocardiogram in 9/22.  Plan CT in 1 year.   10. Chest wall mass I have asked him to f/u with his PCP.  11. ETOH abuse He notes he plans to start rehab soon to stop drinking ETOH.  12.  Hypokalemia Check BMET today.  Start K+ if still running low.   Dispo:  Return in  about 6 months (around 05/20/2021) for Routine follow up in 6 months with Dr.  Irish Lack or Richardson Dopp, PA-C..   Medication Adjustments/Labs and Tests Ordered: Current medicines are reviewed at length with the patient today.  Concerns regarding medicines are outlined above.  Tests Ordered: Orders Placed This Encounter  Procedures   CT ANGIO CHEST AORTA W/CM & OR WO/CM   Basic Metabolic Panel (BMET)   Basic Metabolic Panel (BMET)   Medication Changes: No orders of the defined types were placed in this encounter.  Signed, Richardson Dopp, PA-C  11/19/2020 10:22 AM    [facility] [place], [place], [place]  [address] Phone: [phone]; Fax: [phone]

## 2020-11-19 ENCOUNTER — Encounter: Payer: Self-pay | Admitting: Physician Assistant

## 2020-11-19 ENCOUNTER — Ambulatory Visit (INDEPENDENT_AMBULATORY_CARE_PROVIDER_SITE_OTHER): Payer: Medicare Other

## 2020-11-19 ENCOUNTER — Ambulatory Visit (INDEPENDENT_AMBULATORY_CARE_PROVIDER_SITE_OTHER): Payer: Medicare Other | Admitting: Podiatry

## 2020-11-19 ENCOUNTER — Other Ambulatory Visit: Payer: Self-pay | Admitting: Podiatry

## 2020-11-19 ENCOUNTER — Encounter: Payer: Self-pay | Admitting: Podiatry

## 2020-11-19 ENCOUNTER — Other Ambulatory Visit: Payer: Self-pay

## 2020-11-19 ENCOUNTER — Ambulatory Visit (INDEPENDENT_AMBULATORY_CARE_PROVIDER_SITE_OTHER): Payer: Medicare Other | Admitting: Physician Assistant

## 2020-11-19 VITALS — BP 122/68 | HR 84 | Ht 67.0 in | Wt 157.6 lb

## 2020-11-19 DIAGNOSIS — M1000 Idiopathic gout, unspecified site: Secondary | ICD-10-CM

## 2020-11-19 DIAGNOSIS — I110 Hypertensive heart disease with heart failure: Secondary | ICD-10-CM

## 2020-11-19 DIAGNOSIS — I7121 Aneurysm of the ascending aorta, without rupture: Secondary | ICD-10-CM

## 2020-11-19 DIAGNOSIS — I5020 Unspecified systolic (congestive) heart failure: Secondary | ICD-10-CM

## 2020-11-19 DIAGNOSIS — E876 Hypokalemia: Secondary | ICD-10-CM

## 2020-11-19 DIAGNOSIS — K8043 Calculus of bile duct with acute cholecystitis with obstruction: Secondary | ICD-10-CM

## 2020-11-19 DIAGNOSIS — M21612 Bunion of left foot: Secondary | ICD-10-CM

## 2020-11-19 DIAGNOSIS — I5022 Chronic systolic (congestive) heart failure: Secondary | ICD-10-CM

## 2020-11-19 DIAGNOSIS — I2510 Atherosclerotic heart disease of native coronary artery without angina pectoris: Secondary | ICD-10-CM

## 2020-11-19 DIAGNOSIS — M722 Plantar fascial fibromatosis: Secondary | ICD-10-CM

## 2020-11-19 DIAGNOSIS — M79672 Pain in left foot: Secondary | ICD-10-CM

## 2020-11-19 DIAGNOSIS — M21619 Bunion of unspecified foot: Secondary | ICD-10-CM

## 2020-11-19 DIAGNOSIS — I351 Nonrheumatic aortic (valve) insufficiency: Secondary | ICD-10-CM

## 2020-11-19 DIAGNOSIS — I480 Paroxysmal atrial fibrillation: Secondary | ICD-10-CM | POA: Diagnosis not present

## 2020-11-19 DIAGNOSIS — Z720 Tobacco use: Secondary | ICD-10-CM

## 2020-11-19 DIAGNOSIS — R222 Localized swelling, mass and lump, trunk: Secondary | ICD-10-CM

## 2020-11-19 DIAGNOSIS — N182 Chronic kidney disease, stage 2 (mild): Secondary | ICD-10-CM

## 2020-11-19 DIAGNOSIS — M21611 Bunion of right foot: Secondary | ICD-10-CM

## 2020-11-19 DIAGNOSIS — F1010 Alcohol abuse, uncomplicated: Secondary | ICD-10-CM

## 2020-11-19 LAB — BASIC METABOLIC PANEL
BUN/Creatinine Ratio: 12 (ref 10–24)
BUN: 13 mg/dL (ref 8–27)
CO2: 31 mmol/L — ABNORMAL HIGH (ref 20–29)
Calcium: 9.8 mg/dL (ref 8.6–10.2)
Chloride: 93 mmol/L — ABNORMAL LOW (ref 96–106)
Creatinine, Ser: 1.06 mg/dL (ref 0.76–1.27)
Glucose: 100 mg/dL — ABNORMAL HIGH (ref 70–99)
Potassium: 3.5 mmol/L (ref 3.5–5.2)
Sodium: 137 mmol/L (ref 134–144)
eGFR: 75 mL/min/{1.73_m2} (ref >59)

## 2020-11-19 MED ORDER — MELOXICAM 15 MG PO TABS: 15.0000 mg | ORAL_TABLET | Freq: Every day | ORAL | 0 refills | Status: DC

## 2020-11-19 MED ORDER — DEXAMETHASONE SODIUM PHOSPHATE 120 MG/30ML IJ SOLN
4.0000 mg | Freq: Once | INTRAMUSCULAR | Status: AC
  Administered 2020-11-19: 4 mg via INTRA_ARTICULAR

## 2020-11-19 NOTE — Patient Instructions (Signed)
Medication Instructions:   Your physician recommends that you continue on your current medications as directed. Please refer to the Current Medication list given to you today.  *If you need a refill on your cardiac medications before your next appointment, please call your pharmacy*   Lab Work: TODAY!!!!! BMET  If you have labs (blood work) drawn today and your tests are completely normal, you will receive your results only by: [place] (if you have MyChart) OR A paper copy in the mail If you have any lab test that is abnormal or we need to change your treatment, we will call you to review the results.   Testing/Procedures: Non-Cardiac CT Angiography (CTA), is a special type of CT scan that uses a computer to produce multi-dimensional views of major blood vessels throughout the body. In CT angiography, a contrast material is injected through an IV to help visualize the blood vessels (1YEAR).     Follow-Up: At [facility], you and your health needs are our priority.  As part of our continuing mission to provide you with exceptional heart care, we have created designated Provider Care Teams.  These Care Teams include your primary Cardiologist (physician) and Advanced Practice Providers (APPs -  Physician Assistants and Nurse Practitioners) who all work together to provide you with the care you need, when you need it.  We recommend signing up for the patient portal called "MyChart".  Sign up information is provided on this After Visit Summary.  MyChart is used to connect with patients for Virtual Visits (Telemedicine).  Patients are able to view lab/test results, encounter notes, upcoming appointments, etc.  Non-urgent messages can be sent to your provider as well.   To learn more about what you can do with MyChart, go to [email or web address].    Your next appointment:   6 month(s)  The format for your next appointment:   In Person  Provider:   Casandra Doffing, MD   Other  Instructions Your physician wants you to follow-up in: 6 months with Dr. Irish Lack or Richardson Dopp, PA-C. You will receive a reminder letter in the mail two months in advance. If you don't receive a letter, please call our office to schedule the follow-up appointment.

## 2020-11-19 NOTE — Progress Notes (Signed)
  Subjective:  Patient ID: Leonard [name], male    DOB: 02/28/1950,   MRN: [id number]  No chief complaint on file.   70 y.o. male presents for follow-up of plantar facsciitis as well as bunions and gout flare. Relates today his biggest pain is his left heel. Hurts with first step after sitting. Has not recently been doing anything for the pain in the heel. He was given heel cups previously for this. Has previously been treated by Dr. Cannon Kettle . Denies any other pedal complaints. Denies n/v/f/c.   Past Medical History:  Diagnosis Date   Alcohol abuse    Aortic insufficiency    Asthma    CAD (coronary artery disease)    a. cath 2018 with nonbstructive CAD (40% prox RCA, 25% distal RCA, 25% ramus, 20% mid LAD.   CKD (chronic kidney disease) stage 2, GFR 60-89 ml/min    COPD (chronic obstructive pulmonary disease) (HCC)    Gout    HFrEF (heart failure with reduced ejection fraction) (HCC)    Hypertension    Mitral regurgitation    NICM (nonischemic cardiomyopathy) (HCC)    PAF (paroxysmal atrial fibrillation) (HCC)    Prostate cancer (HCC)    Renal artery stenosis (HCC)    a. mild left renal artery stenosis by cath 2018.   Tobacco abuse     Objective:  Physical Exam: Vascular: DP/PT pulses 2/4 bilateral. CFT <3 seconds. Normal hair growth on digits. No edema.  Skin. No lacerations or abrasions bilateral feet.  Musculoskeletal: MMT 5/5 bilateral lower extremities in DF, PF, Inversion and Eversion. Deceased ROM in DF of ankle joint. Tender to left medial calcaneal tubercle. Tender as well to bilateral bunions and dorsal left foot. Pain with DF and PF of left ankle.  Neurological: Sensation intact to light touch.   Assessment:   1. Plantar fasciitis of left foot   2. Bilateral bunions   3. Idiopathic gout, unspecified chronicity, unspecified site      Plan:  Patient was evaluated and treated and all questions answered. X-rays reviewed and discussed with patient. No acute fractures  or dislocations noted. Cavus foot type. Mild HAV deformity. Cystic changes noted to the first metatarsophalangeal joint.  Discussed plantar fasciitis with patient.  X-rays reviewed and discussed with patient. No acute fractures or dislocations noted. Mild spurring noted at inferior calcaneus.  Discussed treatment options including, ice, NSAIDS, supportive shoes, bracing, and stretching. Stretching exercises provided to be done on a daily basis.   Prescription for meloxicam provided and sent to pharmacy. Patient requesting injection today. Procedure note below.   Discussed gout and long term treatment. Recommend follow-up with PCP for possible long term prevention.  Discussed bilateral bunions as well and options. Disucssed wide shoes as patient is not a surgical candidate at this time.  Follow-up 6 weeks or sooner if any problems arise. In the meantime, encouraged to call the office with any questions, concerns, change in symptoms.   Procedure:  Discussed etiology, pathology, conservative vs. surgical therapies. At this time a plantar fascial injection was recommended.  The patient agreed and a sterile skin prep was applied.  An injection consisting of  dexamethasone and marcaine mixture was infiltrated at the point of maximal tenderness on the left Heel.  Bandaid applied. The patient tolerated this well and was given instructions for aftercare.    Lorenda Peck, DPM

## 2020-11-19 NOTE — Patient Instructions (Signed)

## 2020-11-20 ENCOUNTER — Other Ambulatory Visit: Payer: Self-pay | Admitting: *Deleted

## 2020-11-20 NOTE — Patient Outreach (Signed)
  [facility] [facility]) Care Management  11/20/2020  Leonard [name] [birth year] [id number]   CSW spoke with pt today; "I am still drinking". Pt admits to drinking "about half a gallon in five days". Pt states this is less than it has been (was about 3 days) and states he wants to stop drinking.  "I am scared to go through withdrawal". CSW validated his fears and offered support- also reminding him if he calls his insurance and gets into a detox setting they will approve he can assured he is in the care of medical team overseeing the process.  Pt reminded to call the Customer Service # on back of his insurance card to inquire- CSW offered to make the call with him but pt declines.  Pt reports going to doctor earlier this week for gout- he acknowledges the etoh does not help with the gout symptoms.  CSW has stressed to him he has to make the next step; to seeking detox/rehab.  CSW reminded pt to call if needs arise before next follow up in 4-6 weeks.   Eduard Clos, MSW, [place] Worker  [place] [phone]

## 2020-11-21 ENCOUNTER — Other Ambulatory Visit: Payer: Self-pay | Admitting: *Deleted

## 2020-11-21 DIAGNOSIS — E876 Hypokalemia: Secondary | ICD-10-CM

## 2020-11-21 MED ORDER — POTASSIUM CHLORIDE ER 10 MEQ PO TBCR: 10.0000 meq | EXTENDED_RELEASE_TABLET | Freq: Every day | ORAL | 11 refills | Status: DC

## 2020-11-27 ENCOUNTER — Other Ambulatory Visit: Payer: Medicare Other

## 2020-11-27 ENCOUNTER — Other Ambulatory Visit: Payer: Self-pay

## 2020-11-27 DIAGNOSIS — E876 Hypokalemia: Secondary | ICD-10-CM

## 2020-11-27 LAB — BASIC METABOLIC PANEL
BUN/Creatinine Ratio: 7 — ABNORMAL LOW (ref 10–24)
BUN: 6 mg/dL — ABNORMAL LOW (ref 8–27)
CO2: 25 mmol/L (ref 20–29)
Calcium: 9.5 mg/dL (ref 8.6–10.2)
Chloride: 102 mmol/L (ref 96–106)
Creatinine, Ser: 0.86 mg/dL (ref 0.76–1.27)
Glucose: 90 mg/dL (ref 70–99)
Potassium: 4.1 mmol/L (ref 3.5–5.2)
Sodium: 142 mmol/L (ref 134–144)
eGFR: 93 mL/min/{1.73_m2} (ref >59)

## 2020-12-02 ENCOUNTER — Other Ambulatory Visit: Payer: Self-pay | Admitting: *Deleted

## 2020-12-02 NOTE — Patient Outreach (Signed)
[facility]) Care Management  12/02/2020  Leonard [name] Mar 19, 1950 [id number]   RN Health Coach telephone call to patient.  [employer] compliance verified. Per patient this is not a good time to talk. Patient stated he really wanted to talk with RN Health Coach and would call RN back.  Plan: Patient will return call  [place] Management [phone]

## 2020-12-22 ENCOUNTER — Other Ambulatory Visit: Payer: Self-pay | Admitting: *Deleted

## 2020-12-22 NOTE — Patient Outreach (Signed)
[facility]) Care Management  12/22/2020  Kerem Gilmer [birth year] [id number]   CSW spoke with pt today- he reports he has not done anything more about seeking rehab options.  "I want to wait and get my dentures first and then while I'm healing I can do it". Plans to have teeth extracted this week.  CSW reminded pt to call his insurance for providers/locations he can go-  CSW will call pt again after the holidays to see what he has done.   Eduard Clos, MSW, [place] Worker  [place] [phone]

## 2021-01-05 DIAGNOSIS — M9904 Segmental and somatic dysfunction of sacral region: Secondary | ICD-10-CM | POA: Diagnosis not present

## 2021-01-05 DIAGNOSIS — I5022 Chronic systolic (congestive) heart failure: Secondary | ICD-10-CM | POA: Diagnosis not present

## 2021-01-05 DIAGNOSIS — R7309 Other abnormal glucose: Secondary | ICD-10-CM | POA: Diagnosis not present

## 2021-01-05 DIAGNOSIS — M9905 Segmental and somatic dysfunction of pelvic region: Secondary | ICD-10-CM | POA: Diagnosis not present

## 2021-01-05 DIAGNOSIS — M9903 Segmental and somatic dysfunction of lumbar region: Secondary | ICD-10-CM | POA: Diagnosis not present

## 2021-01-05 DIAGNOSIS — M5136 Other intervertebral disc degeneration, lumbar region: Secondary | ICD-10-CM | POA: Diagnosis not present

## 2021-01-05 DIAGNOSIS — J449 Chronic obstructive pulmonary disease, unspecified: Secondary | ICD-10-CM | POA: Diagnosis not present

## 2021-01-07 ENCOUNTER — Other Ambulatory Visit: Payer: Self-pay

## 2021-01-07 ENCOUNTER — Encounter: Payer: Self-pay | Admitting: Podiatry

## 2021-01-07 ENCOUNTER — Ambulatory Visit (INDEPENDENT_AMBULATORY_CARE_PROVIDER_SITE_OTHER): Payer: Medicare Other | Admitting: Podiatry

## 2021-01-07 DIAGNOSIS — M7730 Calcaneal spur, unspecified foot: Secondary | ICD-10-CM

## 2021-01-07 DIAGNOSIS — M5136 Other intervertebral disc degeneration, lumbar region: Secondary | ICD-10-CM | POA: Diagnosis not present

## 2021-01-07 DIAGNOSIS — M722 Plantar fascial fibromatosis: Secondary | ICD-10-CM

## 2021-01-07 DIAGNOSIS — M9904 Segmental and somatic dysfunction of sacral region: Secondary | ICD-10-CM | POA: Diagnosis not present

## 2021-01-07 DIAGNOSIS — M9903 Segmental and somatic dysfunction of lumbar region: Secondary | ICD-10-CM | POA: Diagnosis not present

## 2021-01-07 DIAGNOSIS — M9905 Segmental and somatic dysfunction of pelvic region: Secondary | ICD-10-CM | POA: Diagnosis not present

## 2021-01-07 MED ORDER — METHYLPREDNISOLONE 4 MG PO TBPK
ORAL_TABLET | ORAL | 0 refills | Status: DC
Start: 2021-01-07 — End: 2022-05-06

## 2021-01-09 NOTE — Progress Notes (Signed)
Subjective:  Patient ID: Leonard [name], male    DOB: Jun 18, 1950,  MRN: [id number]  Chief Complaint  Patient presents with   Plantar Fasciitis    Pt stated that he is still having some pain     70 y.o. male presents with the above complaint.  Patient presents with complaint bilateral Planter fasciitis.  Patient states he still having some residual pain.  He was having left-sided Planter fasciitis with Dr. Karl [name] treated with a steroid injection.  Now he saying the right side is hurting in the left side has a little residual pain.  He wanted to discuss treatment options he denies any other acute complaints.  Injection and brace helps a lot.   Review of Systems: Negative except as noted in the HPI. Denies N/V/F/Ch.  Past Medical History:  Diagnosis Date   Alcohol abuse    Aortic insufficiency    Asthma    CAD (coronary artery disease)    a. cath 2018 with nonbstructive CAD (40% prox RCA, 25% distal RCA, 25% ramus, 20% mid LAD.   CKD (chronic kidney disease) stage 2, GFR 60-89 ml/min    COPD (chronic obstructive pulmonary disease) (HCC)    Gout    HFrEF (heart failure with reduced ejection fraction) (HCC)    Hypertension    Mitral regurgitation    NICM (nonischemic cardiomyopathy) (HCC)    PAF (paroxysmal atrial fibrillation) (HCC)    Prostate cancer (HCC)    Renal artery stenosis (HCC)    a. mild left renal artery stenosis by cath 2018.   Tobacco abuse     Current Outpatient Medications:    methylPREDNISolone (MEDROL DOSEPAK) 4 MG TBPK tablet, Take as directed, Disp: 21 each, Rfl: 0   albuterol (PROVENTIL HFA;VENTOLIN HFA) 108 (90 Base) MCG/ACT inhaler, Inhale 2 puffs into the lungs every 4 (four) hours as needed for wheezing or shortness of breath., Disp: 1 Inhaler, Rfl: 0   albuterol (PROVENTIL) (2.5 MG/3ML) 0.083% nebulizer solution, Take 3 mLs (2.5 mg total) by nebulization every 6 (six) hours as needed for wheezing or shortness of breath., Disp: 75 mL, Rfl: 2   amLODipine  (NORVASC) 5 MG tablet, Take 1 tablet by mouth daily, Disp: 90 tablet, Rfl: 1   carvedilol (COREG) 25 MG tablet, Take 1 tablet (25 mg total) by mouth 2 (two) times daily., Disp: 180 tablet, Rfl: 2   dorzolamide-timolol (COSOPT) 22.3-6.8 MG/ML ophthalmic solution, Place 1 drop into both eyes 2 (two) times daily., Disp: , Rfl:    ENTRESTO 97-103 MG, Take 1 tablet by mouth 2 (two) times daily., Disp: , Rfl:    furosemide (LASIX) 40 MG tablet, Take 1 tablet (40 mg total) by mouth daily. You may take an extra tablet in the PM AS NEEDED for swelling up to twice a week, Disp: 120 tablet, Rfl: 2   latanoprost (XALATAN) 0.005 % ophthalmic solution, Place 1 drop into both eyes at bedtime. , Disp: , Rfl:    meloxicam (MOBIC) 15 MG tablet, Take 1 tablet (15 mg total) by mouth daily., Disp: 30 tablet, Rfl: 0   potassium chloride (KLOR-CON) 10 MEQ tablet, Take 1 tablet (10 mEq total) by mouth daily., Disp: 30 tablet, Rfl: 11   SYMBICORT 160-4.5 MCG/ACT inhaler, Inhale 1 puff into the lungs daily as needed (shortness of breath)., Disp: , Rfl: 3  Current Facility-Administered Medications:    triamcinolone acetonide (KENALOG) 10 MG/ML injection 10 mg, 10 mg, Other, Once, Landis Martins, DPM  Social History   Tobacco Use  Smoking Status Every Day   Packs/day: 0.25   Years: 30.00   Pack years: 7.50   Types: Cigarettes   Last attempt to quit: 12/24/2015   Years since quitting: 5.0  Smokeless Tobacco Never  Tobacco Comments   STtses he has cut down from 2 PPD to 4 cigarettes per day    Allergies  Allergen Reactions   Losartan Rash and Other (See Comments)    Abdominal and leg rashes noted in 12/17   Objective:  There were no vitals filed for this visit. There is no height or weight on file to calculate BMI. Constitutional Well developed. Well nourished.  Vascular Dorsalis pedis pulses palpable bilaterally. Posterior tibial pulses palpable bilaterally. Capillary refill normal to all digits.  No  cyanosis or clubbing noted. Pedal hair growth normal.  Neurologic Normal speech. Oriented to person, place, and time. Epicritic sensation to light touch grossly present bilaterally.  Dermatologic Nails well groomed and normal in appearance. No open wounds. No skin lesions.  Orthopedic: Normal joint ROM without pain or crepitus bilaterally. No visible deformities. Tender to palpation at the calcaneal tuber bilaterally. No pain with calcaneal squeeze bilaterally. Ankle ROM diminished range of motion bilaterally. Silfverskiold Test: positive bilaterally.   Radiographs: Heel spur noted to bilateral calcaneal tuber very mild in nature. Assessment:   1. Plantar fasciitis of left foot   2. Plantar fasciitis of right foot   3. Heel spur, unspecified laterality    Plan:  Patient was evaluated and treated and all questions answered.  Plantar Fasciitis, bilaterally with underlying heel spur - XR reviewed as above.  - Educated on icing and stretching. Instructions given.  - Injection delivered to the plantar fascia as below. - DME: Plantar Fascial Brace - Pharmacologic management: None  Procedure: Injection Tendon/Ligament Location: Bilateral plantar fascia at the glabrous junction; medial approach. Skin Prep: alcohol Injectate: 0.5 cc 0.5% marcaine plain, 0.5 cc of 1% Lidocaine, 0.5 cc kenalog 10. Disposition: Patient tolerated procedure well. Injection site dressed with a band-aid.  Return in about 3 months (around 04/07/2021) for mayer .

## 2021-01-21 ENCOUNTER — Ambulatory Visit: Payer: Medicare Other | Admitting: Podiatry

## 2021-02-04 ENCOUNTER — Ambulatory Visit: Payer: Self-pay | Admitting: *Deleted

## 2021-02-04 ENCOUNTER — Telehealth: Payer: Self-pay | Admitting: *Deleted

## 2021-02-04 NOTE — Telephone Encounter (Signed)
CSW spoke with pt today who reports he has still not looked in to the alcohol rehabs. Pt reminded that he has the "tools" to seek the help he needs when and if he wants. CSW stressed to him the importance of seeking help to stop his drinking; he acknowledges and understands.  CSW will sign off at this time, If additional needs arise; please re-consult CSW>  Eduard Clos, MSW, [place] Worker  [place] [phone]

## 2021-03-02 DIAGNOSIS — M9903 Segmental and somatic dysfunction of lumbar region: Secondary | ICD-10-CM | POA: Diagnosis not present

## 2021-03-02 DIAGNOSIS — M9904 Segmental and somatic dysfunction of sacral region: Secondary | ICD-10-CM | POA: Diagnosis not present

## 2021-03-02 DIAGNOSIS — M5136 Other intervertebral disc degeneration, lumbar region: Secondary | ICD-10-CM | POA: Diagnosis not present

## 2021-03-02 DIAGNOSIS — M9905 Segmental and somatic dysfunction of pelvic region: Secondary | ICD-10-CM | POA: Diagnosis not present

## 2021-03-04 ENCOUNTER — Other Ambulatory Visit: Payer: Self-pay | Admitting: *Deleted

## 2021-03-04 NOTE — Patient Outreach (Signed)
[facility]) Care Management  03/04/2021  Leonard [name] Jan 06, 1951 [id number]   RN Health Coach attempted follow up outreach call to patient.  Patient was unavailable. HIPPA compliance voicemail message left with return callback number.  Plan: RN will call patient again within 30 days.  [place] Care Management [phone]

## 2021-03-11 ENCOUNTER — Ambulatory Visit (INDEPENDENT_AMBULATORY_CARE_PROVIDER_SITE_OTHER): Payer: Medicare Other | Admitting: Podiatry

## 2021-03-11 ENCOUNTER — Other Ambulatory Visit: Payer: Self-pay

## 2021-03-11 ENCOUNTER — Encounter: Payer: Self-pay | Admitting: Podiatry

## 2021-03-11 DIAGNOSIS — L600 Ingrowing nail: Secondary | ICD-10-CM | POA: Diagnosis not present

## 2021-03-11 NOTE — Progress Notes (Signed)
Subjective:  Patient ID: Leonard [name], male    DOB: [birth year],  MRN: [id number]  Chief Complaint  Patient presents with   Ingrown Toenail    Bilateral hallux nail problem     71 y.o. male presents with the above complaint.  Patient presents with complaint of left medial ingrown.  Patient states is painful to touch painful to walk on.  He would like to have removed.  He has not seen any anyone prior to seeing me.  He states pain scale 7 out of 10 dull achy in nature hurts with pressure.  No infection noted.  He is not on antibiotics   Review of Systems: Negative except as noted in the HPI. Denies N/V/F/Ch.  Past Medical History:  Diagnosis Date   Alcohol abuse    Aortic insufficiency    Asthma    CAD (coronary artery disease)    a. cath 2018 with nonbstructive CAD (40% prox RCA, 25% distal RCA, 25% ramus, 20% mid LAD.   CKD (chronic kidney disease) stage 2, GFR 60-89 ml/min    COPD (chronic obstructive pulmonary disease) (HCC)    Gout    HFrEF (heart failure with reduced ejection fraction) (HCC)    Hypertension    Mitral regurgitation    NICM (nonischemic cardiomyopathy) (HCC)    PAF (paroxysmal atrial fibrillation) (HCC)    Prostate cancer (HCC)    Renal artery stenosis (HCC)    a. mild left renal artery stenosis by cath 2018.   Tobacco abuse     Current Outpatient Medications:    albuterol (PROVENTIL HFA;VENTOLIN HFA) 108 (90 Base) MCG/ACT inhaler, Inhale 2 puffs into the lungs every 4 (four) hours as needed for wheezing or shortness of breath., Disp: 1 Inhaler, Rfl: 0   albuterol (PROVENTIL) (2.5 MG/3ML) 0.083% nebulizer solution, Take 3 mLs (2.5 mg total) by nebulization every 6 (six) hours as needed for wheezing or shortness of breath., Disp: 75 mL, Rfl: 2   amLODipine (NORVASC) 5 MG tablet, Take 1 tablet by mouth daily, Disp: 90 tablet, Rfl: 1   carvedilol (COREG) 25 MG tablet, Take 1 tablet (25 mg total) by mouth 2 (two) times daily., Disp: 180 tablet, Rfl: 2    dorzolamide-timolol (COSOPT) 22.3-6.8 MG/ML ophthalmic solution, Place 1 drop into both eyes 2 (two) times daily., Disp: , Rfl:    ENTRESTO 97-103 MG, Take 1 tablet by mouth 2 (two) times daily., Disp: , Rfl:    furosemide (LASIX) 40 MG tablet, Take 1 tablet (40 mg total) by mouth daily. You may take an extra tablet in the PM AS NEEDED for swelling up to twice a week, Disp: 120 tablet, Rfl: 2   latanoprost (XALATAN) 0.005 % ophthalmic solution, Place 1 drop into both eyes at bedtime. , Disp: , Rfl:    meloxicam (MOBIC) 15 MG tablet, Take 1 tablet (15 mg total) by mouth daily., Disp: 30 tablet, Rfl: 0   methylPREDNISolone (MEDROL DOSEPAK) 4 MG TBPK tablet, Take as directed, Disp: 21 each, Rfl: 0   potassium chloride (KLOR-CON) 10 MEQ tablet, Take 1 tablet (10 mEq total) by mouth daily., Disp: 30 tablet, Rfl: 11   SYMBICORT 160-4.5 MCG/ACT inhaler, Inhale 1 puff into the lungs daily as needed (shortness of breath)., Disp: , Rfl: 3  Current Facility-Administered Medications:    triamcinolone acetonide (KENALOG) 10 MG/ML injection 10 mg, 10 mg, Other, Once, Landis Martins, DPM  Social History   Tobacco Use  Smoking Status Every Day   Packs/day: 0.25   Years:  30.00   Pack years: 7.50   Types: Cigarettes   Last attempt to quit: 12/24/2015   Years since quitting: 5.2  Smokeless Tobacco Never  Tobacco Comments   STtses he has cut down from 2 PPD to 4 cigarettes per day    Allergies  Allergen Reactions   Losartan Rash and Other (See Comments)    Abdominal and leg rashes noted in 12/17   Objective:  There were no vitals filed for this visit. There is no height or weight on file to calculate BMI. Constitutional Well developed. Well nourished.  Vascular Dorsalis pedis pulses palpable bilaterally. Posterior tibial pulses palpable bilaterally. Capillary refill normal to all digits.  No cyanosis or clubbing noted. Pedal hair growth normal.  Neurologic Normal speech. Oriented to person,  place, and time. Epicritic sensation to light touch grossly present bilaterally.  Dermatologic Painful ingrowing nail at medial nail borders of the hallux nail left. No other open wounds. No skin lesions.  Orthopedic: Normal joint ROM without pain or crepitus bilaterally. No visible deformities. No bony tenderness.   Radiographs: None Assessment:   1. Ingrown left big toenail    Plan:  Patient was evaluated and treated and all questions answered.  Ingrown Nail, left -Patient elects to proceed with minor surgery to remove ingrown toenail removal today. Consent reviewed and signed by patient. -Ingrown nail excised. See procedure note. -Educated on post-procedure care including soaking. Written instructions provided and reviewed. -Patient to follow up in 2 weeks for nail check.  Procedure: Excision of Ingrown Toenail Location: Left 1st toe medial nail borders. Anesthesia: Lidocaine 1% plain; 1.5 mL and Marcaine 0.5% plain; 1.5 mL, digital block. Skin Prep: Betadine. Dressing: Silvadene; telfa; dry, sterile, compression dressing. Technique: Following skin prep, the toe was exsanguinated and a tourniquet was secured at the base of the toe. The affected nail border was freed, split with a nail splitter, and excised. Chemical matrixectomy was then performed with phenol and irrigated out with alcohol. The tourniquet was then removed and sterile dressing applied. Disposition: Patient tolerated procedure well. Patient to return in 2 weeks for follow-up.   No follow-ups on file.

## 2021-03-18 ENCOUNTER — Ambulatory Visit: Payer: Medicare Other | Admitting: Podiatry

## 2021-04-06 ENCOUNTER — Other Ambulatory Visit: Payer: Self-pay | Admitting: *Deleted

## 2021-04-06 NOTE — Patient Outreach (Signed)
[facility]) Care Management  04/06/2021  Leonard [name] 1950-10-09 [id number]   RN Health Coach attempted follow up outreach call to patient.  Patient was unavailable. Mailbox is full.  Plan: RN will call patient again within 30 days. RN sent unsuccessful outreach letter  [place] Management [phone]

## 2021-04-07 DIAGNOSIS — J441 Chronic obstructive pulmonary disease with (acute) exacerbation: Secondary | ICD-10-CM | POA: Diagnosis not present

## 2021-04-07 DIAGNOSIS — I110 Hypertensive heart disease with heart failure: Secondary | ICD-10-CM | POA: Diagnosis not present

## 2021-04-07 DIAGNOSIS — I10 Essential (primary) hypertension: Secondary | ICD-10-CM | POA: Diagnosis not present

## 2021-04-07 DIAGNOSIS — I5022 Chronic systolic (congestive) heart failure: Secondary | ICD-10-CM | POA: Diagnosis not present

## 2021-04-07 DIAGNOSIS — J449 Chronic obstructive pulmonary disease, unspecified: Secondary | ICD-10-CM | POA: Diagnosis not present

## 2021-04-07 DIAGNOSIS — R7309 Other abnormal glucose: Secondary | ICD-10-CM | POA: Diagnosis not present

## 2021-04-07 DIAGNOSIS — Z720 Tobacco use: Secondary | ICD-10-CM | POA: Diagnosis not present

## 2021-05-04 ENCOUNTER — Other Ambulatory Visit: Payer: Self-pay | Admitting: *Deleted

## 2021-05-04 NOTE — Patient Outreach (Signed)
[facility]) Care Management ? ?05/04/2021 ? ?Leonard [name] ?03-Dec-1950 ?[id number] ? ? ?RN Health Coach attempted follow up outreach call to patient.  Patient was unavailable. HIPPA compliance voicemail message left with return callback number. ? ? ?Plan: ?Unsuccessful outreach letter sent ?RN will call patient again within 30 days. ? ? ? ?Johny Shock BSN RN ?[place] Management ?[phone] ? ?

## 2021-05-06 DIAGNOSIS — H401133 Primary open-angle glaucoma, bilateral, severe stage: Secondary | ICD-10-CM | POA: Diagnosis not present

## 2021-05-07 DIAGNOSIS — M50321 Other cervical disc degeneration at C4-C5 level: Secondary | ICD-10-CM | POA: Diagnosis not present

## 2021-05-07 DIAGNOSIS — M9901 Segmental and somatic dysfunction of cervical region: Secondary | ICD-10-CM | POA: Diagnosis not present

## 2021-05-07 DIAGNOSIS — M9903 Segmental and somatic dysfunction of lumbar region: Secondary | ICD-10-CM | POA: Diagnosis not present

## 2021-05-07 DIAGNOSIS — M5136 Other intervertebral disc degeneration, lumbar region: Secondary | ICD-10-CM | POA: Diagnosis not present

## 2021-06-02 ENCOUNTER — Other Ambulatory Visit: Payer: Self-pay | Admitting: *Deleted

## 2021-06-02 NOTE — Patient Instructions (Signed)
Visit Information ? ?Thank you for taking time to visit with me today. Please don't hesitate to contact me if I can be of assistance to you before our next scheduled telephone appointment. ? ?Following are the goals we discussed today:  ?Current Barriers:  ?Knowledge Deficits related to plan of care for management of CHF  ? ?RNCM Clinical Goal(s):  ?Patient will verbalize understanding of plan for management of CHF as evidenced by monitoring weight and adhering to low sodium diet through collaboration with RN Care manager, provider, and care team.  ? ?Interventions: ?Inter-disciplinary care team collaboration (see longitudinal plan of care) ?Evaluation of current treatment plan related to  self management and patient's adherence to plan as established by provider ? ? ?Heart Failure Interventions:  (Status:  Goal on track:  Yes.) Long Term Goal ?Provided education on low sodium diet ?Reviewed Heart Failure Action Plan in depth and provided written copy ?Provided education about placing scale on hard, flat surface ?Discussed the importance of keeping all appointments with provider ?Provided patient with education about the role of exercise in the management of heart failure ? ?Patient Goals/Self-Care Activities: ?Take all medications as prescribed ?Attend all scheduled provider appointments ?Call pharmacy for medication refills 3-7 days in advance of running out of medications ?Perform all self care activities independently  ?Perform IADL's (shopping, preparing meals, housekeeping, managing finances) independently ?Call provider office for new concerns or questions  ?Work with the Education officer, museum to address care coordination needs and will continue to work with the clinical team to address health care and disease management related needs ?call the Suicide and Crisis Lifeline: 988 if experiencing a [place] or [place]  ?call office if I gain more than 2 pounds in one day or 5 pounds in one week ?use  salt in moderation ?watch for swelling in feet, ankles and legs every day ?follow rescue plan if symptoms flare-up ?track symptoms and what helps feel better or worse ? ?Follow Up Plan:  Telephone follow up appointment with care management team member scheduled for:  August 26, 2021 ?The patient has been provided with contact information for the care management team and has been advised to call with any health related questions or concerns.   ? ?Our next appointment is by telephone on August 26, 2021 ? ?Please call Johny Shock RN [phone] if you need to cancel or reschedule your appointment.  ? ?Please call the Suicide and Crisis Lifeline: 988 if you are experiencing a Mental Health or [place] or need someone to talk to. ? ?The patient verbalized understanding of instructions, educational materials, and care plan provided today and agreed to receive a mailed copy of patient instructions, educational materials, and care plan.  ? ?Telephone follow up appointment with care management team member scheduled for: ?The patient has been provided with contact information for the care management team and has been advised to call with any health related questions or concerns.  ? ?SIGNATURE ? ?Johny Shock BSN RN ?[place] Management ?[phone] ? ? ?  ?

## 2021-06-02 NOTE — Patient Outreach (Signed)
[facility]) Care Management ?RN Health Coach Note ? ? ?06/02/2021 ?Name:  Jaquel Glassburn MRN:  [id number] DOB:  1951-01-28 ? ?Summary: ?Per patient his appetite was good. He has not been monitoring his blood pressure at home. He stated at the Dr office his blood pressure is better. Patient has not been exercising.  Per patient his appetite is good. He has been drinking 6 ETOH drinks a day. He has not followed thru with help.  ? ?Recommendations/Changes made from today's visit: ?Alcohol Abuse ?Joining a gym  ?Dietary adherence ?Medication adherence ? ? ?Subjective: ?Kayden Hutmacher is an 71 y.o. year old male who is a primary patient of Lucianne Lei, MD. The care management team was consulted for assistance with care management and/or care coordination needs.   ? ?RN Health Coach completed Telephone Visit today.  ? ?Objective: ? ?Medications Reviewed Today   ? ? Reviewed by Felipa Furnace, DPM (Physician) on 03/11/21 at 1259  Med List Status: <None>  ? ?Medication Order Taking? Sig Documenting Provider Last Dose Status Informant  ?albuterol (PROVENTIL HFA;VENTOLIN HFA) 108 (90 Base) MCG/ACT inhaler [id number] No Inhale 2 puffs into the lungs every 4 (four) hours as needed for wheezing or shortness of breath. Horton, Barbette Hair, MD Taking Active Self  ?         ?Med Note Jacinta Shoe   Tue Apr 17, 2019  3:54 PM)    ?albuterol (PROVENTIL) (2.5 MG/3ML) 0.083% nebulizer solution [id number] No Take 3 mLs (2.5 mg total) by nebulization every 6 (six) hours as needed for wheezing or shortness of breath. Milagros Loll, MD Taking Active Self  ?amLODipine (NORVASC) 5 MG tablet [id number] No Take 1 tablet by mouth daily Imogene Burn, PA-C Taking Active   ?carvedilol (COREG) 25 MG tablet [id number] No Take 1 tablet (25 mg total) by mouth 2 (two) times daily. Jettie Booze, MD Taking Active Self  ?dorzolamide-timolol (COSOPT) 22.3-6.8 MG/ML ophthalmic solution [id number] No Place 1 drop into both eyes 2  (two) times daily. [provider] Taking Active Self  ?ENTRESTO 97-103 MG [id number] No Take 1 tablet by mouth 2 (two) times daily. [provider] Taking Active Self  ?furosemide (LASIX) 40 MG tablet [id number] No Take 1 tablet (40 mg total) by mouth daily. You may take an extra tablet in the PM AS NEEDED for swelling up to twice a week Jettie Booze, MD Taking Active Self  ?latanoprost (XALATAN) 0.005 % ophthalmic solution [id number] No Place 1 drop into both eyes at bedtime.  [provider] Taking Active Self  ?         ?Med Note (MENDENHALL, Kathlyn [place] Jan 09, 2018 10:45 AM)    ?meloxicam (MOBIC) 15 MG tablet [id number]  Take 1 tablet (15 mg total) by mouth daily. Lorenda Peck, DPM  Active   ?methylPREDNISolone (MEDROL DOSEPAK) 4 MG TBPK tablet [id number]  Take as directed Felipa Furnace, DPM  Active   ?potassium chloride (KLOR-CON) 10 MEQ tablet [id number]  Take 1 tablet (10 mEq total) by mouth daily. Liliane Shi, PA-C  Active   ?SYMBICORT 160-4.5 MCG/ACT inhaler [id number] No Inhale 1 puff into the lungs daily as needed (shortness of breath). [provider] Taking Active Self  ?triamcinolone acetonide (KENALOG) 10 MG/ML injection 10 mg [id number]   Landis Martins, DPM  Active   ? ?  ?  ? ?  ? ? ? ?SDOH:  (Social Determinants of Health) assessments and interventions performed:  ?  SDOH Interventions   ? ?Flowsheet Row Most Recent Value  ?SDOH Interventions   ?Food Insecurity Interventions Intervention Not Indicated  ?Housing Interventions Intervention Not Indicated  ?Transportation Interventions Intervention Not Indicated  ? ?  ? ? ?Care Plan ? ?Review of patient past medical history, allergies, medications, health status, including review of consultants reports, laboratory and other test data, was performed as part of comprehensive evaluation for care management services.  ? ?Current Barriers:  ?Knowledge Deficits related to plan of care for management of CHF   ? ?RNCM Clinical Goal(s):  ?Patient will verbalize understanding of plan for management of CHF as evidenced by monitoring weight and adhering to low sodium diet through collaboration with RN Care manager, provider, and care team.  ? ?Interventions: ?Inter-disciplinary care team collaboration (see longitudinal plan of care) ?Evaluation of current treatment plan related to  self management and patient's adherence to plan as established by provider ? ? ?Heart Failure Interventions:  (Status:  Goal on track:  Yes.) Long Term Goal ?Provided education on low sodium diet ?Reviewed Heart Failure Action Plan in depth and provided written copy ?Provided education about placing scale on hard, flat surface ?Discussed the importance of keeping all appointments with provider ?Provided patient with education about the role of exercise in the management of heart failure ? ?Patient Goals/Self-Care Activities: ?Take all medications as prescribed ?Attend all scheduled provider appointments ?Call pharmacy for medication refills 3-7 days in advance of running out of medications ?Perform all self care activities independently  ?Perform IADL's (shopping, preparing meals, housekeeping, managing finances) independently ?Call provider office for new concerns or questions  ?Work with the Education officer, museum to address care coordination needs and will continue to work with the clinical team to address health care and disease management related needs ?call the Suicide and Crisis Lifeline: 988 if experiencing a [place] or [place]  ?call office if I gain more than 2 pounds in one day or 5 pounds in one week ?use salt in moderation ?watch for swelling in feet, ankles and legs every day ?follow rescue plan if symptoms flare-up ?track symptoms and what helps feel better or worse ? ?Follow Up Plan:  Telephone follow up appointment with care management team member scheduled for:  August 26, 2021 ?The patient has been provided with  contact information for the care management team and has been advised to call with any health related questions or concerns.   ?  ? ?Plan: Telephone follow up appointment with care management team member scheduled for:  August 26, 2021 ?The patient has been provided with contact information for the care management team and has been advised to call with any health related questions or concerns.  ? ?Johny Shock BSN RN ?[place] Management ?[phone] ? ? ? ?  ?

## 2021-07-08 ENCOUNTER — Other Ambulatory Visit: Payer: Self-pay | Admitting: Family Medicine

## 2021-07-08 DIAGNOSIS — K625 Hemorrhage of anus and rectum: Secondary | ICD-10-CM

## 2021-07-08 DIAGNOSIS — I5022 Chronic systolic (congestive) heart failure: Secondary | ICD-10-CM | POA: Diagnosis not present

## 2021-07-08 DIAGNOSIS — R109 Unspecified abdominal pain: Secondary | ICD-10-CM

## 2021-07-08 DIAGNOSIS — Z720 Tobacco use: Secondary | ICD-10-CM | POA: Diagnosis not present

## 2021-07-08 DIAGNOSIS — R1110 Vomiting, unspecified: Secondary | ICD-10-CM | POA: Diagnosis not present

## 2021-07-08 DIAGNOSIS — I10 Essential (primary) hypertension: Secondary | ICD-10-CM | POA: Diagnosis not present

## 2021-07-08 DIAGNOSIS — F17219 Nicotine dependence, cigarettes, with unspecified nicotine-induced disorders: Secondary | ICD-10-CM | POA: Diagnosis not present

## 2021-07-13 ENCOUNTER — Ambulatory Visit
Admission: RE | Admit: 2021-07-13 | Discharge: 2021-07-13 | Disposition: A | Discharge status: Home / Self Care | Payer: Medicare Other | Source: Ambulatory Visit | Attending: *Deleted | Admitting: *Deleted

## 2021-07-13 DIAGNOSIS — K625 Hemorrhage of anus and rectum: Secondary | ICD-10-CM

## 2021-07-13 DIAGNOSIS — K824 Cholesterolosis of gallbladder: Secondary | ICD-10-CM | POA: Diagnosis not present

## 2021-07-13 DIAGNOSIS — R109 Unspecified abdominal pain: Secondary | ICD-10-CM

## 2021-07-13 DIAGNOSIS — K8020 Calculus of gallbladder without cholecystitis without obstruction: Secondary | ICD-10-CM | POA: Diagnosis not present

## 2021-07-13 DIAGNOSIS — K760 Fatty (change of) liver, not elsewhere classified: Secondary | ICD-10-CM | POA: Diagnosis not present

## 2021-08-26 ENCOUNTER — Other Ambulatory Visit: Payer: Self-pay | Admitting: *Deleted

## 2021-08-26 NOTE — Patient Outreach (Signed)
[facility]) Care Management  08/26/2021  Leonard [name] [birth year] [id number]  Tn Health Coach attempted outreach called. No answer and mailbox full. RN Health Coach will close case . Patient has not been able to progress through care plan. Patient has not followed up with counseling. He is not weighing daily. He does not monitor blood pressure only reading from Dr office.   Plan: RN Health Coach Case Closure of CHF  [place] Care Management [phone]

## 2021-10-20 DIAGNOSIS — M5136 Other intervertebral disc degeneration, lumbar region: Secondary | ICD-10-CM | POA: Diagnosis not present

## 2021-10-20 DIAGNOSIS — M9904 Segmental and somatic dysfunction of sacral region: Secondary | ICD-10-CM | POA: Diagnosis not present

## 2021-10-20 DIAGNOSIS — M9903 Segmental and somatic dysfunction of lumbar region: Secondary | ICD-10-CM | POA: Diagnosis not present

## 2021-10-20 DIAGNOSIS — M9905 Segmental and somatic dysfunction of pelvic region: Secondary | ICD-10-CM | POA: Diagnosis not present

## 2021-11-23 DIAGNOSIS — M9904 Segmental and somatic dysfunction of sacral region: Secondary | ICD-10-CM | POA: Diagnosis not present

## 2021-11-23 DIAGNOSIS — M5136 Other intervertebral disc degeneration, lumbar region: Secondary | ICD-10-CM | POA: Diagnosis not present

## 2021-11-23 DIAGNOSIS — M9903 Segmental and somatic dysfunction of lumbar region: Secondary | ICD-10-CM | POA: Diagnosis not present

## 2021-11-23 DIAGNOSIS — M9905 Segmental and somatic dysfunction of pelvic region: Secondary | ICD-10-CM | POA: Diagnosis not present

## 2022-01-15 DIAGNOSIS — F17200 Nicotine dependence, unspecified, uncomplicated: Secondary | ICD-10-CM | POA: Insufficient documentation

## 2022-04-22 ENCOUNTER — Ambulatory Visit (INDEPENDENT_AMBULATORY_CARE_PROVIDER_SITE_OTHER): Payer: Medicare HMO | Admitting: Podiatry

## 2022-04-22 DIAGNOSIS — M109 Gout, unspecified: Secondary | ICD-10-CM | POA: Diagnosis not present

## 2022-04-22 DIAGNOSIS — L600 Ingrowing nail: Secondary | ICD-10-CM

## 2022-04-22 DIAGNOSIS — M10072 Idiopathic gout, left ankle and foot: Secondary | ICD-10-CM | POA: Diagnosis not present

## 2022-04-22 MED ORDER — MELOXICAM 7.5 MG PO TABS: 7.5000 mg | ORAL_TABLET | Freq: Every day | ORAL | 0 refills | Status: DC

## 2022-04-22 MED ORDER — METHYLPREDNISOLONE 4 MG PO TBPK: ORAL_TABLET | ORAL | 0 refills | Status: DC

## 2022-04-22 NOTE — Patient Instructions (Signed)

## 2022-04-22 NOTE — Progress Notes (Signed)
Subjective:  Patient ID: Leonard [name], male    DOB: 09/10/1950,  MRN: [id number]  Chief Complaint  Patient presents with   Nail Problem    Right foot pain. Thinks he may have gout and an ingrown    72 y.o. male presents with concern for gout flare in the left foot.  Also with concern for ingrown nail on the left great toe.  Has a border that is very painful with pressure on it.  He does have a history of gout attacks in the past and this feels very similar to that painful at the great toe joint and in the midfoot  Past Medical History:  Diagnosis Date   Alcohol abuse    Aortic insufficiency    Asthma    CAD (coronary artery disease)    a. cath 2018 with nonbstructive CAD (40% prox RCA, 25% distal RCA, 25% ramus, 20% mid LAD.   CKD (chronic kidney disease) stage 2, GFR 60-89 ml/min    COPD (chronic obstructive pulmonary disease) (HCC)    Gout    HFrEF (heart failure with reduced ejection fraction) (HCC)    Hypertension    Mitral regurgitation    NICM (nonischemic cardiomyopathy) (HCC)    PAF (paroxysmal atrial fibrillation) (HCC)    Prostate cancer (HCC)    Renal artery stenosis (HCC)    a. mild left renal artery stenosis by cath 2018.   Tobacco abuse     Allergies  Allergen Reactions   Losartan Rash and Other (See Comments)    Abdominal and leg rashes noted in 12/17    ROS: Negative except as per HPI above  Objective:  General: AAO x3, NAD  Dermatological: Painful at the ingrown border of the left hallux medial aspect.  Pain with palpation area there is no drainage or open wound present.  Vascular:  Dorsalis Pedis artery and Posterior Tibial artery pedal pulses are 2/4 bilateral.  Capillary fill time < 3 sec to all digits.   Neruologic: Grossly intact via light touch bilateral. Protective threshold intact to all sites bilateral.   Musculoskeletal: Focal area of edema and mild erythema over the first MPJ and the midfoot.  Likely gouty tophi present.  Pain increased  with pressure and motion of the first object  Gait: Unassisted, Nonantalgic.   No images are attached to the encounter.   Assessment:   1. Acute gout of left foot, unspecified cause   2. Ingrown left big toenail      Plan:  Patient was evaluated and treated and all questions answered.  # Gout of the left first MPJ and midfoot acute attack -Discussed the patient does appear to have gout flare in the left foot -Recommend treat with methylprednisolone steroid taper pack 4 mg take as directed for 6 days -Discussed risk and benefits of medication   Ingrown Nail, left -Patient elects to proceed with minor surgery to remove ingrown toenail today. Consent reviewed and signed by patient. -Ingrown nail excised. See procedure note. -Educated on post-procedure care including soaking. Written instructions provided and reviewed. -Patient to follow up in 2 weeks for nail check.  Procedure: Excision of Ingrown Toenail Location: Left 1st toe medial nail borders. Anesthesia: Lidocaine 1% plain; 1.5 mL and Marcaine 0.5% plain; 1.5 mL, digital block. Skin Prep: Betadine. Dressing: Silvadene; telfa; dry, sterile, compression dressing. Technique: Following skin prep, the toe was exsanguinated and a tourniquet was secured at the base of the toe. The affected nail border was freed, split with a nail splitter,  and excised. Chemical matrixectomy was then performed with phenol and irrigated out with alcohol. The tourniquet was then removed and sterile dressing applied. Disposition: Patient tolerated procedure well. Patient to return in 2 weeks for follow-up.    Return in about 2 weeks (around 05/06/2022) for f/u gout/ L hallux ingrown.          Everitt Amber, DPM [facility]

## 2022-05-06 ENCOUNTER — Encounter: Payer: Self-pay | Admitting: Podiatry

## 2022-05-06 ENCOUNTER — Ambulatory Visit (INDEPENDENT_AMBULATORY_CARE_PROVIDER_SITE_OTHER): Payer: Medicare HMO | Admitting: Podiatry

## 2022-05-06 VITALS — BP 148/84 | HR 78

## 2022-05-06 DIAGNOSIS — L600 Ingrowing nail: Secondary | ICD-10-CM

## 2022-05-06 DIAGNOSIS — M109 Gout, unspecified: Secondary | ICD-10-CM | POA: Diagnosis not present

## 2022-05-06 MED ORDER — UREA 10 % EX CREA: TOPICAL_CREAM | CUTANEOUS | 0 refills | Status: DC | PRN

## 2022-05-06 NOTE — Progress Notes (Signed)
Subjective: Leonard [name] is a 72 y.o.  male returns to office today for follow up evaluation after having left Hallux medial border nail ingrown removal with phenol and alcohol matrixectomy approximately 2 weeks ago. Patient has been soaking using epsom salts and vinegar and applying topical antibiotic covered with bandaid daily. Patient denies fevers, chills, nausea, vomiting. Denies any calf pain, chest pain, SOB.   Objective:  Vitals: Reviewed  General: Well developed, nourished, in no acute distress, alert and oriented x3   Dermatology: Skin is warm, dry and supple bilateral. left hallux nail border appears to be clean, dry, with mild granular tissue and surrounding scab. There is no surrounding erythema, edema, drainage/purulence. The remaining nails appear unremarkable at this time. There are no other lesions or other signs of infection present.  Neurovascular status: Intact. No lower extremity swelling; No pain with calf compression bilateral.  Musculoskeletal: Decreased tenderness to palpation of the left hallux nail fold(s). Muscular strength within normal limits bilateral.   Assesement and Plan: S/p phenol and alcohol matrixectomy to the  left hallux nail medial, doing well.   -Continue soaking in epsom salts twice a day followed by antibiotic ointment and a band-aid. Can leave uncovered at night. Continue this until completely healed.  -If the area has not healed in 2 weeks, call the office for follow-up appointment, or sooner if any problems arise.  -Monitor for any signs/symptoms of infection. Call the office immediately if any occur or go directly to the emergency room. Call with any questions/concerns.        Everitt Amber, [place] [facility]                   05/06/2022

## 2022-10-25 ENCOUNTER — Ambulatory Visit (INDEPENDENT_AMBULATORY_CARE_PROVIDER_SITE_OTHER): Payer: Medicare HMO | Admitting: Podiatry

## 2022-10-25 DIAGNOSIS — Z91199 Patient's noncompliance with other medical treatment and regimen due to unspecified reason: Secondary | ICD-10-CM

## 2022-10-25 NOTE — Progress Notes (Signed)
No show

## 2022-10-27 ENCOUNTER — Ambulatory Visit (INDEPENDENT_AMBULATORY_CARE_PROVIDER_SITE_OTHER): Payer: Medicare HMO | Admitting: Podiatry

## 2022-10-27 DIAGNOSIS — G629 Polyneuropathy, unspecified: Secondary | ICD-10-CM | POA: Diagnosis not present

## 2022-10-27 NOTE — Progress Notes (Signed)
Subjective:  Patient ID: Leonard [name], male    DOB: Jul 09, 1950,   MRN: [id number]  Chief Complaint  Patient presents with   RFC    RFC   Numbness    BILAT, BEEN GOING ON FOR THE PAST 6 MO,     72 y.o. male presents for concern of numbness burning and tingling in both feet and toes that has been ongoing for six months. Denies any treatments. Relates he is not diabetic. He does have some low back pain.  . Denies any other pedal complaints. Denies n/v/f/c.   Past Medical History:  Diagnosis Date   Alcohol abuse    Aortic insufficiency    Asthma    CAD (coronary artery disease)    a. cath 2018 with nonbstructive CAD (40% prox RCA, 25% distal RCA, 25% ramus, 20% mid LAD.   CKD (chronic kidney disease) stage 2, GFR 60-89 ml/min    COPD (chronic obstructive pulmonary disease) (HCC)    Gout    HFrEF (heart failure with reduced ejection fraction) (HCC)    Hypertension    Mitral regurgitation    NICM (nonischemic cardiomyopathy) (HCC)    PAF (paroxysmal atrial fibrillation) (HCC)    Prostate cancer (HCC)    Renal artery stenosis (HCC)    a. mild left renal artery stenosis by cath 2018.   Tobacco abuse     Objective:  Physical Exam: Vascular: DP/PT pulses 2/4 bilateral. CFT <3 seconds. Normal hair growth on digits. No edema.  Skin. No lacerations or abrasions bilateral feet.  Musculoskeletal: MMT 5/5 bilateral lower extremities in DF, PF, Inversion and Eversion. Deceased ROM in DF of ankle joint. Protective senstion intact. No pain to palpation.  Neurological: Sensation intact to light touch.   Assessment:   1. Neuropathy      Plan:  Patient was evaluated and treated and all questions answered. Discussed neuropathy and etiology as well as treatment with patient.  -Discussed supportive shoes at all times and checking feet regularly.  -Follow-up with PCP for prescription management of gabapentin potentially -Discussed capsaicin cream. Discussed following up with back  specialist and taking a multivitamin.  -Patient to return in 3 months for follow-up evaluation.    Louann Sjogren, DPM

## 2022-12-02 ENCOUNTER — Encounter (HOSPITAL_COMMUNITY): Payer: Self-pay

## 2022-12-02 ENCOUNTER — Emergency Department (HOSPITAL_COMMUNITY): Payer: 59

## 2022-12-02 ENCOUNTER — Emergency Department (HOSPITAL_COMMUNITY)
Admission: EM | Admit: 2022-12-02 | Discharge: 2022-12-03 | Disposition: A | Discharge status: Home / Self Care | Payer: 59 | Attending: Emergency Medicine | Admitting: Emergency Medicine

## 2022-12-02 ENCOUNTER — Other Ambulatory Visit: Payer: Self-pay

## 2022-12-02 DIAGNOSIS — I509 Heart failure, unspecified: Secondary | ICD-10-CM | POA: Diagnosis not present

## 2022-12-02 DIAGNOSIS — Z7951 Long term (current) use of inhaled steroids: Secondary | ICD-10-CM | POA: Insufficient documentation

## 2022-12-02 DIAGNOSIS — Z79899 Other long term (current) drug therapy: Secondary | ICD-10-CM | POA: Diagnosis not present

## 2022-12-02 DIAGNOSIS — Z1152 Encounter for screening for COVID-19: Secondary | ICD-10-CM | POA: Diagnosis not present

## 2022-12-02 DIAGNOSIS — J449 Chronic obstructive pulmonary disease, unspecified: Secondary | ICD-10-CM | POA: Diagnosis not present

## 2022-12-02 DIAGNOSIS — R4182 Altered mental status, unspecified: Secondary | ICD-10-CM | POA: Diagnosis present

## 2022-12-02 DIAGNOSIS — I110 Hypertensive heart disease with heart failure: Secondary | ICD-10-CM | POA: Diagnosis not present

## 2022-12-02 DIAGNOSIS — I2510 Atherosclerotic heart disease of native coronary artery without angina pectoris: Secondary | ICD-10-CM | POA: Diagnosis not present

## 2022-12-02 LAB — COMPREHENSIVE METABOLIC PANEL
ALT: 13 U/L (ref 0–44)
AST: 21 U/L (ref 15–41)
Albumin: 3.6 g/dL (ref 3.5–5.0)
Alkaline Phosphatase: 59 U/L (ref 38–126)
Anion gap: 9 (ref 5–15)
BUN: 13 mg/dL (ref 8–23)
CO2: 25 mmol/L (ref 22–32)
Calcium: 9.2 mg/dL (ref 8.9–10.3)
Chloride: 104 mmol/L (ref 98–111)
Creatinine, Ser: 1.03 mg/dL (ref 0.61–1.24)
GFR, Estimated: >60 mL/min (ref >60)
Glucose, Bld: 97 mg/dL (ref 70–99)
Potassium: 4.2 mmol/L (ref 3.5–5.1)
Sodium: 138 mmol/L (ref 135–145)
Total Bilirubin: 1.3 mg/dL — ABNORMAL HIGH (ref 0.3–1.2)
Total Protein: 6.6 g/dL (ref 6.5–8.1)

## 2022-12-02 LAB — CBC WITH DIFFERENTIAL/PLATELET
Abs Immature Granulocytes: 0.02 10*3/uL (ref 0.00–0.07)
Basophils Absolute: 0 10*3/uL (ref 0.0–0.1)
Basophils Relative: 0 %
Eosinophils Absolute: 0.3 10*3/uL (ref 0.0–0.5)
Eosinophils Relative: 4 %
HCT: 41.7 % (ref 39.0–52.0)
Hemoglobin: 14.4 g/dL (ref 13.0–17.0)
Immature Granulocytes: 0 %
Lymphocytes Relative: 22 %
Lymphs Abs: 1.7 10*3/uL (ref 0.7–4.0)
MCH: 33 pg (ref 26.0–34.0)
MCHC: 34.5 g/dL (ref 30.0–36.0)
MCV: 95.4 fL (ref 80.0–100.0)
Monocytes Absolute: 0.9 10*3/uL (ref 0.1–1.0)
Monocytes Relative: 11 %
Neutro Abs: 4.8 10*3/uL (ref 1.7–7.7)
Neutrophils Relative %: 63 %
Platelets: 160 10*3/uL (ref 150–400)
RBC: 4.37 MIL/uL (ref 4.22–5.81)
RDW: 13.2 % (ref 11.5–15.5)
WBC: 7.7 10*3/uL (ref 4.0–10.5)
nRBC: 0 % (ref 0.0–0.2)

## 2022-12-02 LAB — I-STAT VENOUS BLOOD GAS, ED
Acid-Base Excess: 1 mmol/L (ref 0.0–2.0)
Bicarbonate: 26.4 mmol/L (ref 20.0–28.0)
Calcium, Ion: 1.17 mmol/L (ref 1.15–1.40)
HCT: 45 % (ref 39.0–52.0)
Hemoglobin: 15.3 g/dL (ref 13.0–17.0)
O2 Saturation: 99 %
Potassium: 4.1 mmol/L (ref 3.5–5.1)
Sodium: 138 mmol/L (ref 135–145)
TCO2: 28 mmol/L (ref 22–32)
pCO2, Ven: 44.1 mm[Hg] (ref 44–60)
pH, Ven: 7.385 (ref 7.25–7.43)
pO2, Ven: 118 mm[Hg] — ABNORMAL HIGH (ref 32–45)

## 2022-12-02 LAB — RESP PANEL BY RT-PCR (RSV, FLU A&B, COVID)  RVPGX2
Influenza A by PCR: NEGATIVE
Influenza B by PCR: NEGATIVE
Resp Syncytial Virus by PCR: NEGATIVE
SARS Coronavirus 2 by RT PCR: NEGATIVE

## 2022-12-02 LAB — URINALYSIS, W/ REFLEX TO CULTURE (INFECTION SUSPECTED)
Bacteria, UA: NONE SEEN
Bilirubin Urine: NEGATIVE
Glucose, UA: NEGATIVE mg/dL
Ketones, ur: NEGATIVE mg/dL
Leukocytes,Ua: NEGATIVE
Nitrite: NEGATIVE
Protein, ur: 100 mg/dL — AB
Specific Gravity, Urine: 1.009 (ref 1.005–1.030)
pH: 5 (ref 5.0–8.0)

## 2022-12-02 LAB — RAPID URINE DRUG SCREEN, HOSP PERFORMED
Amphetamines: NOT DETECTED
Barbiturates: NOT DETECTED
Benzodiazepines: NOT DETECTED
Cocaine: NOT DETECTED
Opiates: NOT DETECTED
Tetrahydrocannabinol: POSITIVE — AB

## 2022-12-02 LAB — I-STAT CHEM 8, ED
BUN: 13 mg/dL (ref 8–23)
Calcium, Ion: 1.18 mmol/L (ref 1.15–1.40)
Chloride: 103 mmol/L (ref 98–111)
Creatinine, Ser: 1.1 mg/dL (ref 0.61–1.24)
Glucose, Bld: 94 mg/dL (ref 70–99)
HCT: 46 % (ref 39.0–52.0)
Hemoglobin: 15.6 g/dL (ref 13.0–17.0)
Potassium: 4.1 mmol/L (ref 3.5–5.1)
Sodium: 139 mmol/L (ref 135–145)
TCO2: 25 mmol/L (ref 22–32)

## 2022-12-02 LAB — ETHANOL: Alcohol, Ethyl (B): <10 mg/dL (ref <10)

## 2022-12-02 LAB — AMMONIA: Ammonia: 31 umol/L (ref 9–35)

## 2022-12-02 LAB — CBG MONITORING, ED: Glucose-Capillary: 90 mg/dL (ref 70–99)

## 2022-12-02 NOTE — ED Notes (Signed)
Patient transported to CT 

## 2022-12-02 NOTE — ED Provider Notes (Addendum)
[place] EMERGENCY DEPARTMENT AT [facility] Provider Note   CSN: [id number] Arrival date & time: 12/02/22  0920     History  Chief Complaint  Patient presents with   Altered Mental Status    Leonard [name] is a 72 y.o. male.  Leonard [name] is a 72 y.o. male with a history of coronary artery disease, heart failure, hypertension, paroxysmal A-fib, COPD, who presents to the emergency department via EMS for evaluation of altered mental status.  Patient lives in independent living.  Home health came for routine check and found patient lying on the couch leaning to the right and he was difficult to arouse.  When EMS arrived patient had a cup of alcohol on the table beside him patient also was found with a bottle of Remeron amoxicillin and ibuprofen, patient had recent tooth extraction and was prescribed the antibiotics.  When EMS arrived vitals were stable but patient was somnolent and not following most commands.  On arrival patient will arouse to verbal stimuli and can tell me his name.  Patient denies any pain, reports he just does not feel well.  Patient quickly falls back asleep and is unable to provide further history.  On reevaluation after patient has awoken more he does report drinking alcohol last night.  The history is provided by the patient, the EMS personnel and medical records.       Home Medications Prior to Admission medications   Medication Sig Start Date End Date Taking? Authorizing Provider  albuterol (PROVENTIL HFA;VENTOLIN HFA) 108 (90 Base) MCG/ACT inhaler Inhale 2 puffs into the lungs every 4 (four) hours as needed for wheezing or shortness of breath. 08/10/17   Horton, Mayer Masker, MD  albuterol (PROVENTIL) (2.5 MG/3ML) 0.083% nebulizer solution Take 3 mLs (2.5 mg total) by nebulization every 6 (six) hours as needed for wheezing or shortness of breath. 01/19/16   Lora Paula, MD  allopurinol (ZYLOPRIM) 100 MG tablet Take 100 mg by mouth daily. 01/11/21    [provider]  amLODipine (NORVASC) 5 MG tablet Take 1 tablet by mouth daily 04/25/18   Dyann Kief, PA-C  carvedilol (COREG) 25 MG tablet Take 1 tablet (25 mg total) by mouth 2 (two) times daily. 09/25/19   Corky Crafts, MD  Cholecalciferol (VITAMIN D3) 50 MCG (2000 UT) capsule Take 2,000 Units by mouth daily. 01/11/21   [provider]  dorzolamide-timolol (COSOPT) 22.3-6.8 MG/ML ophthalmic solution Place 1 drop into both eyes 2 (two) times daily. 08/05/18   [provider]  ENTRESTO 97-103 MG Take 1 tablet by mouth 2 (two) times daily. 10/04/18   [provider]  furosemide (LASIX) 40 MG tablet Take 1 tablet (40 mg total) by mouth daily. You may take an extra tablet in the PM AS NEEDED for swelling up to twice a week 09/25/19   Corky Crafts, MD  latanoprost (XALATAN) 0.005 % ophthalmic solution Place 1 drop into both eyes at bedtime.  03/04/16   [provider]  meloxicam (MOBIC) 15 MG tablet Take 1 tablet (15 mg total) by mouth daily. 11/19/20   Louann Sjogren, DPM  meloxicam (MOBIC) 7.5 MG tablet Take 1 tablet (7.5 mg total) by mouth daily. 04/22/22   Standiford, Jenelle Mages, DPM  methylPREDNISolone (MEDROL) 4 MG tablet Take by mouth. 04/23/22   [provider]  mirtazapine (REMERON) 15 MG tablet Take 15 mg by mouth at bedtime. 04/20/22   [provider]  mirtazapine (REMERON) 7.5 MG tablet Take  7.5 mg by mouth at bedtime. 04/08/22   [provider]  potassium chloride (KLOR-CON) 10 MEQ tablet Take 1 tablet (10 mEq total) by mouth daily. 11/21/20 11/21/21  Tereso Newcomer T, PA-C  SYMBICORT 160-4.5 MCG/ACT inhaler Inhale 1 puff into the lungs daily as needed (shortness of breath). 12/08/17   [provider]  urea (CARMOL) 10 % cream Apply topically as needed. 05/06/22   Standiford, Jenelle Mages, DPM      Allergies    Losartan    Review of Systems   Review of Systems  Unable to perform ROS: Mental  status change    Physical Exam Updated Vital Signs BP (!) 142/67 (BP Location: Right Arm)   Pulse (!) 51   Temp 97.6 F (36.4 C)   Resp 17   SpO2 100%  Physical Exam Vitals and nursing note reviewed.  Constitutional:      General: He is not in acute distress.    Appearance: Normal appearance. He is well-developed. He is not diaphoretic.     Comments: Somnolent but arouses to verbal stimuli  HENT:     Head: Normocephalic and atraumatic.     Mouth/Throat:     Mouth: Mucous membranes are moist.     Pharynx: Oropharynx is clear.  Eyes:     General:        Right eye: No discharge.        Left eye: No discharge.     Extraocular Movements: Extraocular movements intact.     Pupils: Pupils are equal, round, and reactive to light.  Cardiovascular:     Rate and Rhythm: Normal rate and regular rhythm.     Pulses: Normal pulses.     Heart sounds: Normal heart sounds.  Pulmonary:     Effort: Pulmonary effort is normal. No respiratory distress.     Breath sounds: Normal breath sounds. No wheezing or rales.     Comments: Respirations equal and unlabored, patient able to speak in full sentences, lungs clear to auscultation bilaterally  Abdominal:     General: Bowel sounds are normal. There is no distension.     Palpations: Abdomen is soft. There is no mass.     Tenderness: There is no abdominal tenderness. There is no guarding.     Comments: Abdomen soft, nondistended, nontender to palpation in all quadrants without guarding or peritoneal signs  Musculoskeletal:        General: No deformity.     Cervical back: Neck supple.  Skin:    General: Skin is warm and dry.     Capillary Refill: Capillary refill takes less than 2 seconds.  Neurological:     Mental Status: He is alert and oriented to person, place, and time.     Coordination: Coordination normal.     Comments: Speech is slurred able to follow commands CN III-XII intact Normal strength in upper and lower extremities bilaterally  including dorsiflexion and plantar flexion, strong and equal grip strength Sensation normal to light and sharp touch Moves extremities without ataxia, coordination intact  Psychiatric:        Mood and Affect: Mood normal.        Behavior: Behavior normal.     ED Results / Procedures / Treatments   Labs (all labs ordered are listed, but only abnormal results are displayed) Labs Reviewed  COMPREHENSIVE METABOLIC PANEL - Abnormal; Notable for the following components:      Result Value   Total Bilirubin 1.3 (*)  All other components within normal limits  URINALYSIS, W/ REFLEX TO CULTURE (INFECTION SUSPECTED) - Abnormal; Notable for the following components:   Hgb urine dipstick MODERATE (*)    Protein, ur 100 (*)    All other components within normal limits  RAPID URINE DRUG SCREEN, HOSP PERFORMED - Abnormal; Notable for the following components:   Tetrahydrocannabinol POSITIVE (*)    All other components within normal limits  I-STAT VENOUS BLOOD GAS, ED - Abnormal; Notable for the following components:   pO2, Ven 118 (*)    All other components within normal limits  RESP PANEL BY RT-PCR (RSV, FLU A&B, COVID)  RVPGX2  ETHANOL  CBC WITH DIFFERENTIAL/PLATELET  AMMONIA  I-STAT CHEM 8, ED  CBG MONITORING, ED    EKG EKG Interpretation Date/Time:  Thursday December 02 2022 09:54:02 EDT Ventricular Rate:  64 PR Interval:  245 QRS Duration:  98 QT Interval:  429 QTC Calculation: 443 R Axis:   1  Text Interpretation: Sinus rhythm Prolonged PR interval Anterior infarct, old Baseline wander in lead(s) V2 V3 no significant change since 2022 Confirmed by Pricilla Loveless [phone]) on 12/02/2022 9:59:32 AM  Radiology DG Chest Port 1 View  Result Date: 12/02/2022 CLINICAL DATA:  Altered mental status. EXAM: PORTABLE CHEST 1 VIEW COMPARISON:  Chest radiograph dated October 27, 2018 FINDINGS: Low lung volumes. The heart size and mediastinal contours are within normal limits. Right  basilar linear atelectasis or scarring. No focal consolidation. No pneumothorax or sizable pleural effusion. No acute osseous abnormality. IMPRESSION: Low lung volumes.  No acute cardiopulmonary findings. Electronically Signed   By: Hart Robinsons M.D.   On: 12/02/2022 11:02   CT Head Wo Contrast  Result Date: 12/02/2022 CLINICAL DATA:  Mental status change, unknown cause. Lethargy. Generalized weakness. EXAM: CT HEAD WITHOUT CONTRAST TECHNIQUE: Contiguous axial images were obtained from the base of the skull through the vertex without intravenous contrast. RADIATION DOSE REDUCTION: This exam was performed according to the departmental dose-optimization program which includes automated exposure control, adjustment of the mA and/or kV according to patient size and/or use of iterative reconstruction technique. COMPARISON:  MRI brain and orbits 08/16/2018 FINDINGS: Brain: There is no evidence of an acute infarct, intracranial hemorrhage, mass, midline shift, or extra-axial fluid collection. There is mild cerebral atrophy. Cerebral white matter hypodensities are nonspecific but compatible with mild-to-moderate chronic small vessel ischemic disease. A moderate-sized right cerebellar infarct is new from the prior MRI but chronic in appearance. Vascular: No hyperdense vessel. Skull: No acute fracture or suspicious osseous lesion. Sinuses/Orbits: Chronic sinusitis with left ostiomeatal unit obstructive pattern with near complete opacification of the left frontal, at anterior and mid ethmoid, and left maxillary sinuses. Possible fluid level in the left maxillary sinus could indicate acute on chronic sinusitis. Mild scattered right-sided paranasal sinus mucosal thickening. Clear mastoid air cells. Bilateral cataract extraction. Other: None. IMPRESSION: 1. No evidence of acute intracranial abnormality. 2. Mild-to-moderate chronic small vessel ischemic disease. 3. Interval chronic right cerebellar infarct. 4. Chronic or  acute on chronic left-sided sinusitis. Electronically Signed   By: Sebastian Ache M.D.   On: 12/02/2022 10:21    Procedures Procedures    Medications Ordered in ED Medications - No data to display  ED Course/ Medical Decision Making/ A&P                                 Medical Decision Making Amount and/or Complexity of Data  Reviewed Labs: ordered. Radiology: ordered.   72 year old male arrives with altered mental status, found with Remeron and alcohol on table next to him on the couch.  Differential includes alcohol intoxication, ingestion, polypharmacy, infection including bacterial or viral etiologies, electrolyte derangement, hepatic encephalopathy, CO2 retention, intracranial hemorrhage, head injury.  On arrival patient's vitals are stable.  EMS reports patient was mildly hypoxic when sleeping on arrival and placed on 2 L nasal cannula, oxygen removed and patient maintaining sats on room air.  On reevaluation after being present department for about an hour patient is becoming increasingly alert and responsive  Laboratory evaluation is overall reassuring, no evidence of acidosis or CO2 retention, no significant electrolyte derangements, normal renal liver function, no leukocytosis or anemia present, UA with some hematuria present but no evidence of urinary tract infection, COVID, flu and RSV testing negative, ammonia is normal.  Ethanol level is negative although patient does report he was drinking last night and it has been several hours, UDS is positive for THC.  Imaging reviewed and interpreted independently, chest x-ray without cardiopulmonary disease, head CT without intracranial bleeding or lesion.  Workup has been largely reassuring, high clinical suspicion for ingestion or combination of alcohol and Remeron leading to somnolence.  Patient has been observed in the emergency department for several hours and has become increasingly responsive and alert able to eat and drink and  able to ambulate with a walker, he reports that he uses a cane at home.  Patient does not have any when he can call to pick him up and does not have any family that lives close by and is requesting assistance with a ride home.  Caution the patient to avoid combining alcohol with his other medications.  At this time there does not appear to be any evidence of an acute emergency medical condition requiring further emergent evaluation and the patient appears stable for discharge with appropriate outpatient follow up. Diagnosis and return precautions discussed with patient who verbalizes understanding and is agreeable to discharge.           Final Clinical Impression(s) / ED Diagnoses Final diagnoses:  Altered mental status, unspecified altered mental status type    Rx / DC Orders ED Discharge Orders     None         Legrand Rams 12/04/22 0008    Pricilla Loveless, MD 12/07/22 1501    Simeon Craft, PA-C 12/23/22 1221    Pricilla Loveless, MD 12/31/22 1357

## 2022-12-02 NOTE — Discharge Instructions (Signed)
Please do not combine alcohol with your chronic medications, especially your nighttime Remeron.  The rest of your workup today was reassuring.  Follow-up with your primary care provider and return to the emergency department for any new or worsening symptoms.

## 2022-12-02 NOTE — ED Notes (Signed)
Pt. Ambulated w/ assistance from walker. Pt. Stated he uses a cane at home. Pt. Was unsteady upon standing but after adjusting pt. Ambulated well. Pt. Endorsed pain from his right shoulder when finished ambulating. Pt. Returned to bed and connected to machine.

## 2022-12-02 NOTE — ED Triage Notes (Signed)
Pt BIB [facility] for lethargy and Gen weakness.  Pt lives in Independent living.  Home health came for routine check and found pt lying on couch, leaned to right and not very responsive.  Per EMS a medic was on scene with them, the pt passed a stroke screen for them. Per EMS the pt is arousable but goes back to sleep.    EMS found a cup of alcohol (wild [place]) on table beside him. EMS reports he stated he was drinking it last night.  EMS also found a bottle of Remeron, Amoxicillin and Ibuprofen.  Pt had recent tooth extraction.    140/60 88% RA, 95% 2L HR 76, CBG 144.  EMS states Lungs clear.  Pt is RA at baseline

## 2022-12-02 NOTE — ED Provider Notes (Signed)
Patient taken in signout from PA [place].  Initial report was that patient had taken Remeron and alcohol and was confused.  He is at baseline now and earlier reports from nurse tech was that the patient was able to ambulate unassisted however patient's nurse, Enid Cutter states that he feels very uncomfortable with him going home in an [place] because he seems more unsteady on his feet.  He states he was easily able to ambulate here with a walker however we are unable to get one at this time.  Patient will likely need to board overnight until he can get a walker in the morning to be discharged safely.   Arthor Captain, PA-C 12/02/22 2005    Sloan Leiter, DO 12/02/22 2228

## 2022-12-03 NOTE — ED Notes (Signed)
Company came to deliver walker.   Patient stating he did not order that.  Patient stating does not want the walker that was brought for him.   Patient ambulating without difficulty.   Patient stating he is ready to go home, states he can call someone or call himself a cab.   Patient ambulated out of ER with steady gait.

## 2022-12-03 NOTE — ED Provider Notes (Incomplete)
[place] EMERGENCY DEPARTMENT AT [facility] Provider Note   CSN: [id number] Arrival date & time: 12/02/22  4403     History {Add pertinent medical, surgical, social history, OB history to HPI:1} Chief Complaint  Patient presents with  . Altered Mental Status    Leonard [name] is a 72 y.o. male.  Leonard [name] is a 72 y.o. male with a history of coronary artery disease, heart failure, hypertension, paroxysmal A-fib, COPD, who presents to the emergency department via EMS for evaluation of altered mental status.  Patient lives in independent living.  Home health came for routine check and found patient lying on the couch leaning to the right and he was difficult to arouse.  When EMS arrived patient had a cup of alcohol on the table beside him patient also was found with a bottle of Remeron amoxicillin and ibuprofen, patient had recent tooth extraction and was prescribed the antibiotics.  When EMS arrived vitals were stable but patient was somnolent and not following most commands.  On arrival patient will arouse to verbal stimuli and can tell me his name.  Patient denies any pain, reports he just does not feel well.  Patient quickly falls back asleep and is unable to provide further history.  On reevaluation after patient has awoken more he does report drinking alcohol last night.  The history is provided by the patient, the EMS personnel and medical records.       Home Medications Prior to Admission medications   Medication Sig Start Date End Date Taking? Authorizing Provider  albuterol (PROVENTIL HFA;VENTOLIN HFA) 108 (90 Base) MCG/ACT inhaler Inhale 2 puffs into the lungs every 4 (four) hours as needed for wheezing or shortness of breath. 08/10/17   Horton, Mayer Masker, MD  albuterol (PROVENTIL) (2.5 MG/3ML) 0.083% nebulizer solution Take 3 mLs (2.5 mg total) by nebulization every 6 (six) hours as needed for wheezing or shortness of breath. 01/19/16   Lora Paula, MD   allopurinol (ZYLOPRIM) 100 MG tablet Take 100 mg by mouth daily. 01/11/21   [provider]  amLODipine (NORVASC) 5 MG tablet Take 1 tablet by mouth daily 04/25/18   Dyann Kief, PA-C  carvedilol (COREG) 25 MG tablet Take 1 tablet (25 mg total) by mouth 2 (two) times daily. 09/25/19   Corky Crafts, MD  Cholecalciferol (VITAMIN D3) 50 MCG (2000 UT) capsule Take 2,000 Units by mouth daily. 01/11/21   [provider]  dorzolamide-timolol (COSOPT) 22.3-6.8 MG/ML ophthalmic solution Place 1 drop into both eyes 2 (two) times daily. 08/05/18   [provider]  ENTRESTO 97-103 MG Take 1 tablet by mouth 2 (two) times daily. 10/04/18   [provider]  furosemide (LASIX) 40 MG tablet Take 1 tablet (40 mg total) by mouth daily. You may take an extra tablet in the PM AS NEEDED for swelling up to twice a week 09/25/19   Corky Crafts, MD  latanoprost (XALATAN) 0.005 % ophthalmic solution Place 1 drop into both eyes at bedtime.  03/04/16   [provider]  meloxicam (MOBIC) 15 MG tablet Take 1 tablet (15 mg total) by mouth daily. 11/19/20   Louann Sjogren, DPM  meloxicam (MOBIC) 7.5 MG tablet Take 1 tablet (7.5 mg total) by mouth daily. 04/22/22   Standiford, Jenelle Mages, DPM  methylPREDNISolone (MEDROL) 4 MG tablet Take by mouth. 04/23/22   [provider]  mirtazapine (REMERON) 15 MG tablet Take 15 mg by mouth at bedtime. 04/20/22   [provider]  mirtazapine (REMERON) 7.5 MG tablet Take 7.5 mg by mouth at bedtime. 04/08/22   [provider]  potassium chloride (KLOR-CON) 10 MEQ tablet Take 1 tablet (10 mEq total) by mouth daily. 11/21/20 11/21/21  Tereso Newcomer T, PA-C  SYMBICORT 160-4.5 MCG/ACT inhaler Inhale 1 puff into the lungs daily as needed (shortness of breath). 12/08/17   [provider]  urea (CARMOL) 10 % cream Apply topically as needed. 05/06/22   Standiford, Jenelle Mages, DPM      Allergies    Losartan     Review of Systems   Review of Systems  Unable to perform ROS: Mental status change    Physical Exam Updated Vital Signs BP (!) 142/67 (BP Location: Right Arm)   Pulse (!) 51   Temp 97.6 F (36.4 C)   Resp 17   SpO2 100%  Physical Exam Vitals and nursing note reviewed.  Constitutional:      General: He is not in acute distress.    Appearance: Normal appearance. He is well-developed. He is not diaphoretic.     Comments: Somnolent but arouses to verbal stimuli  HENT:     Head: Normocephalic and atraumatic.     Mouth/Throat:     Mouth: Mucous membranes are moist.     Pharynx: Oropharynx is clear.  Eyes:     General:        Right eye: No discharge.        Left eye: No discharge.     Extraocular Movements: Extraocular movements intact.     Pupils: Pupils are equal, round, and reactive to light.  Cardiovascular:     Rate and Rhythm: Normal rate and regular rhythm.     Pulses: Normal pulses.     Heart sounds: Normal heart sounds.  Pulmonary:     Effort: Pulmonary effort is normal. No respiratory distress.     Breath sounds: Normal breath sounds. No wheezing or rales.     Comments: Respirations equal and unlabored, patient able to speak in full sentences, lungs clear to auscultation bilaterally  Abdominal:     General: Bowel sounds are normal. There is no distension.     Palpations: Abdomen is soft. There is no mass.     Tenderness: There is no abdominal tenderness. There is no guarding.     Comments: Abdomen soft, nondistended, nontender to palpation in all quadrants without guarding or peritoneal signs  Musculoskeletal:        General: No deformity.     Cervical back: Neck supple.  Skin:    General: Skin is warm and dry.     Capillary Refill: Capillary refill takes less than 2 seconds.  Neurological:     Mental Status: He is alert and oriented to person, place, and time.     Coordination: Coordination normal.     Comments: Speech is slurred able to follow commands CN  III-XII intact Normal strength in upper and lower extremities bilaterally including dorsiflexion and plantar flexion, strong and equal grip strength Sensation normal to light and sharp touch Moves extremities without ataxia, coordination intact  Psychiatric:        Mood and Affect: Mood normal.        Behavior: Behavior normal.     ED Results / Procedures / Treatments   Labs (all labs ordered are listed, but only abnormal results are displayed) Labs Reviewed  COMPREHENSIVE METABOLIC PANEL - Abnormal; Notable for the following components:      Result Value  Total Bilirubin 1.3 (*)    All other components within normal limits  URINALYSIS, W/ REFLEX TO CULTURE (INFECTION SUSPECTED) - Abnormal; Notable for the following components:   Hgb urine dipstick MODERATE (*)    Protein, ur 100 (*)    All other components within normal limits  RAPID URINE DRUG SCREEN, HOSP PERFORMED - Abnormal; Notable for the following components:   Tetrahydrocannabinol POSITIVE (*)    All other components within normal limits  I-STAT VENOUS BLOOD GAS, ED - Abnormal; Notable for the following components:   pO2, Ven 118 (*)    All other components within normal limits  RESP PANEL BY RT-PCR (RSV, FLU A&B, COVID)  RVPGX2  ETHANOL  CBC WITH DIFFERENTIAL/PLATELET  AMMONIA  I-STAT CHEM 8, ED  CBG MONITORING, ED    EKG EKG Interpretation Date/Time:  Thursday December 02 2022 09:54:02 EDT Ventricular Rate:  64 PR Interval:  245 QRS Duration:  98 QT Interval:  429 QTC Calculation: 443 R Axis:   1  Text Interpretation: Sinus rhythm Prolonged PR interval Anterior infarct, old Baseline wander in lead(s) V2 V3 no significant change since 2022 Confirmed by Pricilla Loveless [phone]) on 12/02/2022 9:59:32 AM  Radiology DG Chest Port 1 View  Result Date: 12/02/2022 CLINICAL DATA:  Altered mental status. EXAM: PORTABLE CHEST 1 VIEW COMPARISON:  Chest radiograph dated October 27, 2018 FINDINGS: Low lung volumes.  The heart size and mediastinal contours are within normal limits. Right basilar linear atelectasis or scarring. No focal consolidation. No pneumothorax or sizable pleural effusion. No acute osseous abnormality. IMPRESSION: Low lung volumes.  No acute cardiopulmonary findings. Electronically Signed   By: Hart Robinsons M.D.   On: 12/02/2022 11:02   CT Head Wo Contrast  Result Date: 12/02/2022 CLINICAL DATA:  Mental status change, unknown cause. Lethargy. Generalized weakness. EXAM: CT HEAD WITHOUT CONTRAST TECHNIQUE: Contiguous axial images were obtained from the base of the skull through the vertex without intravenous contrast. RADIATION DOSE REDUCTION: This exam was performed according to the departmental dose-optimization program which includes automated exposure control, adjustment of the mA and/or kV according to patient size and/or use of iterative reconstruction technique. COMPARISON:  MRI brain and orbits 08/16/2018 FINDINGS: Brain: There is no evidence of an acute infarct, intracranial hemorrhage, mass, midline shift, or extra-axial fluid collection. There is mild cerebral atrophy. Cerebral white matter hypodensities are nonspecific but compatible with mild-to-moderate chronic small vessel ischemic disease. A moderate-sized right cerebellar infarct is new from the prior MRI but chronic in appearance. Vascular: No hyperdense vessel. Skull: No acute fracture or suspicious osseous lesion. Sinuses/Orbits: Chronic sinusitis with left ostiomeatal unit obstructive pattern with near complete opacification of the left frontal, at anterior and mid ethmoid, and left maxillary sinuses. Possible fluid level in the left maxillary sinus could indicate acute on chronic sinusitis. Mild scattered right-sided paranasal sinus mucosal thickening. Clear mastoid air cells. Bilateral cataract extraction. Other: None. IMPRESSION: 1. No evidence of acute intracranial abnormality. 2. Mild-to-moderate chronic small vessel  ischemic disease. 3. Interval chronic right cerebellar infarct. 4. Chronic or acute on chronic left-sided sinusitis. Electronically Signed   By: Sebastian Ache M.D.   On: 12/02/2022 10:21    Procedures Procedures  {Document cardiac monitor, telemetry assessment procedure when appropriate:1}  Medications Ordered in ED Medications - No data to display  ED Course/ Medical Decision Making/ A&P   {   Click here for ABCD2, HEART and other calculatorsREFRESH Note before signing :1}  Medical Decision Making Amount and/or Complexity of Data Reviewed Labs: ordered. Radiology: ordered.   72 year old male arrives with altered mental status, found with Remeron and alcohol on table next to him on the couch.  Differential includes alcohol intoxication, ingestion, polypharmacy, infection, electrolyte derangement, hepatic encephalopathy, CO2 retention, intracranial hemorrhage, head injury,  {Document critical care time when appropriate:1} {Document review of labs and clinical decision tools ie heart score, Chads2Vasc2 etc:1}  {Document your independent review of radiology images, and any outside records:1} {Document your discussion with family members, caretakers, and with consultants:1} {Document social determinants of health affecting pt's care:1} {Document your decision making why or why not admission, treatments were needed:1} Final Clinical Impression(s) / ED Diagnoses Final diagnoses:  Altered mental status, unspecified altered mental status type    Rx / DC Orders ED Discharge Orders     None

## 2023-04-06 ENCOUNTER — Ambulatory Visit: Payer: 59 | Attending: Physician Assistant | Admitting: Physician Assistant

## 2023-04-06 DIAGNOSIS — I77810 Thoracic aortic ectasia: Secondary | ICD-10-CM | POA: Insufficient documentation

## 2023-04-06 DIAGNOSIS — I351 Nonrheumatic aortic (valve) insufficiency: Secondary | ICD-10-CM | POA: Insufficient documentation

## 2023-04-06 DIAGNOSIS — I2510 Atherosclerotic heart disease of native coronary artery without angina pectoris: Secondary | ICD-10-CM | POA: Insufficient documentation

## 2023-04-06 DIAGNOSIS — I480 Paroxysmal atrial fibrillation: Secondary | ICD-10-CM | POA: Insufficient documentation

## 2023-04-06 NOTE — Progress Notes (Deleted)
 {This patient may be at risk for Amyloid. He has one or more dx on the problem list or PMH from the following list - Abnormal EKG, CHF, Aortic Stenosis, Proteinuria, LVH, Carpal Tunnel Syndrome, Biceps Tendon Rupture, Syncope. See list below or review PMH.  Diagnoses From Problem List           Noted     Acute exacerbation of congestive heart failure (HCC) 02/24/2017     CHF exacerbation (HCC) 02/24/2017     Chronic HFrEF (heart failure with reduced ejection fraction) (HCC) 10/09/2020     Heart failure with reduced ejection fraction (HCC) 01/10/2016    Click HERE to open Cardiac Amyloid Screening SmartSet to order screening OR Click HERE to defer testing for 1 year or permanently :1}    Cardiology Office Note:    Date:  04/06/2023  ID:  Leonard [name], DOB 02-17-1950, MRN [id number] PCP: Renaye Rakers, MD  [facility] Providers Cardiologist:  Lance Muss, MD { Click to update primary MD,subspecialty MD or APP then REFRESH:1}    {Click to Open Review  :1}   Patient Profile:      HFimpEF (heart failure with improved ejection fraction)  Non-ischemic cardiomyopathy  Echocardiogram 1/19: EF 30-35, Gr 1 DD, Mod AI, mild MR Hx of cough w/ ACE and rash w/ ARB TTE 10/10/20: EF 60-65, no RWMA, GR 1 DD, normal RVSF, mild MR, mild AI, mild dilation of ascending aorta (40 mm)  Coronary artery disease  Mild to mod non-obstructive dz on cath in 2018 [facility] 06/10/2016: RCA proximal 40, distal 25; RI 25; LAD mid 20 Paroxysmal atrial fibrillation  Documented in 2020 At OV in 3/21 pt declined anticoagulation  2nd degree AVB, Type 1 Cardiac monitor obtained from primary care in November 2024 demonstrated sinus rhythm with a minimum heart rate of 28, average 76.  Second-degree AV block Mobitz 1 was present.  PVCs/PACs <1% Aortic insufficiency  Mod by echocardiogram in 2019 Mild mitral regurgitation (echocardiogram 2019) Hx of pulmonary embolism in 02/2017 Completed 6 mos Rivaroxaban Ascending  thoracic aortic aneurysm CT 2019: 4 cm  Hypertensive heart disease Chronic kidney disease  +Cigs +ETOH COPD Gout Prostate CA Hx of poor medication adherence       { Gabriel Conry is a 73 y.o. male who returns for ***     :1}     Discussed the use of AI scribe software for clinical note transcription with the patient, who gave verbal consent to proceed.  History of Present Illness Leonard [name] is a 73 y.o. male who returns for evaluation of bradycardia. He was last seen in 11/2020. We received a monitor done by his PCP recently in 12/2022 that showed NSR, 2nd degree AVB type 1. Avg HR was in the 70s but he did have HRs as low as 28.   ROS-See HPI***    Studies Reviewed:       *** Results    Risk Assessment/Calculations:   {Does this patient have ATRIAL FIBRILLATION?[phone]} No BP recorded.  {Refresh Note OR Click here to enter BP  :1}***       Physical Exam:   VS:  There were no vitals taken for this visit.   Wt Readings from Last 3 Encounters:  12/02/22 156 lb 8.4 oz (71 kg)  11/19/20 157 lb 9.6 oz (71.5 kg)  10/08/20 173 lb 12.8 oz (78.8 kg)    Physical Exam***     Assessment and Plan:   Assessment & Plan Bradycardia  Heart  failure with improved ejection fraction (HFimpEF) (HCC)  Coronary artery disease involving native coronary artery of native heart without angina pectoris  Paroxysmal atrial fibrillation (HCC)  Nonrheumatic aortic valve insufficiency  Ascending aorta dilation (HCC)  Assessment and Plan Assessment & Plan    {  1. HFimpEF (heart failure with improved EF) Ejection fraction has improved to normal by echocardiogram 10/10/2020.  Nonischemic cardiomyopathy.  NYHA II.  Volume status stable.  Continue carvedilol 25 mg twice daily, Entresto 97/23 mg twice daily, furosemide 40 mg daily.  Follow-up in 6 months.   2. Coronary artery disease involving native coronary artery of native heart without angina pectoris Nonobstructive disease by  cardiac catheterization in 2018.  He is not having anginal symptoms.  He is not on statin therapy.  Given his alcohol use and recent elevated LFTs in the setting of choledocholithiasis, I will hold off on starting anything at this time.  We can revisit this in the future.  His most recent LDL was fairly close to optimal without medical therapy.   3. Hypertensive heart disease with chronic systolic congestive heart failure (HCC) Blood pressures are well controlled.  Continue amlodipine 5 mg daily, carvedilol 25 mg twice daily, Entresto 97/103 mg twice daily.   4. PAF (paroxysmal atrial fibrillation) (HCC) He has declined anticoagulation in the past.   5. CKD (chronic kidney disease) stage 2, GFR 60-89 ml/min Recent creatinine normal.   6. Nonrheumatic aortic valve insufficiency Mild by recent echocardiogram.   7. Tobacco abuse I have recommended cessation.   8. Calculus of bile duct with acute cholecystitis and obstruction I have asked him to follow-up with primary care.   9. Aneurysm of ascending aorta without rupture 40 mm by Echocardiogram in 9/22.  Plan CT in 1 year.    10. Chest wall mass I have asked him to f/u with his PCP.   11. ETOH abuse He notes he plans to start rehab soon to stop drinking ETOH.   12.  Hypokalemia Check BMET today.  Start K+ if still running low.    :1}    {Are you ordering a CV Procedure (e.g. stress test, cath, DCCV, TEE, etc)?   Press F2        :[id number]}  Dispo:  No follow-ups on file.  Signed, Tereso Newcomer, PA-C

## 2023-04-07 ENCOUNTER — Telehealth: Payer: Self-pay | Admitting: *Deleted

## 2023-04-07 NOTE — Telephone Encounter (Signed)
 Call placed to pt to try to reschedule his missed appointment from 04/06/23.  Left message for pt to call back.

## 2023-04-07 NOTE — Telephone Encounter (Signed)
-----   Message from [place] sent at 04/06/2023  5:04 PM EST ----- Pt was a no show. Can we make sure he reschedules? ----- Message ----- From: Ross Marcus, CMA Sent: 02/10/2023   2:59 PM EST To: Beatrice Lecher, PA-C

## 2023-04-15 NOTE — Telephone Encounter (Signed)
 2nd attempt to reach pt regarding message below.  Left a message for pt to call back and get appointment.

## 2023-04-22 ENCOUNTER — Encounter: Payer: Self-pay | Admitting: Physician Assistant

## 2023-06-01 NOTE — Progress Notes (Unsigned)
 Cardiology Office Note    Patient Name: Leonard [name] Date of Encounter: 06/01/2023  Primary Care Provider:  Jonathon Neighbors, MD Primary Cardiologist:  Avery Bodo, MD Primary Electrophysiologist: None   Past Medical History    Past Medical History:  Diagnosis Date   Alcohol abuse    Aortic insufficiency    Asthma    CAD (coronary artery disease)    a. cath 2018 with nonbstructive CAD (40% prox RCA, 25% distal RCA, 25% ramus, 20% mid LAD.   CKD (chronic kidney disease) stage 2, GFR 60-89 ml/min    COPD (chronic obstructive pulmonary disease) (HCC)    Gout    HFrEF (heart failure with reduced ejection fraction) (HCC)    Hypertension    Mitral regurgitation    NICM (nonischemic cardiomyopathy) (HCC)    PAF (paroxysmal atrial fibrillation) (HCC)    Prostate cancer (HCC)    Renal artery stenosis (HCC)    a. mild left renal artery stenosis by cath 2018.   Tobacco abuse     History of Present Illness  Leonard [name] is a 73 y.o. male with a PMH of HFrEF, NICM, CAD, HTN, asthma, tobacco abuse, EtOH abuse, history of PE, paroxysmal AF (patient declined AC), ascending thoracic aneurysm, COPD, CKD, prostate CA, poor medication compliance who presents today for overdue follow-up.  Leonard [name] was seen initially by Dr. Jacquelynn Matter in 2018 for reduced EF seen on 2D echo.  He underwent left heart cath for further evaluation that showed nonobstructive CAD and had follow-up echo on 2019 that showed decreased EF of 30-35% with cardiomyopathy felt to be nonischemic.  He was diagnosed with AF with RVR on 10/2018 and left AMA and was seen in follow-up by Dr. Jacquelynn Matter 3/21 but declined AC at that time.  He was seen by Marlyse Single, PA on 10/08/2020 after missing several follow-ups to reestablish care.  During visit patient reported no significant shortness of breath but did note some increased leg edema.  He reported trying to get into AA for assistance with substance abuse.  He was noted to have no  current labs and had a chest lipoma/cyst noted on exam with chest CTA ordered.  He was seen in the ED 3 days later with severe epigastric right upper quadrant abdominal pain.  He underwent ultrasound and was found to have cholelithiasis and underwent evaluation by cardiology for preoperative clearance. 2D echo was updated and showed EF 60-65%, grade 1 DD, normal RV, mild MR, mild AI, mild dilation of ascending aorta ( ).  He was granted clearance but left AMA.  He was last seen in office on 11/19/2020 and reported no further abdominal pain and denied any chest pain or shortness of breath.  Advised to follow-up with his PCP regarding chest wall mass and calculus of bile duct.  He was admitted on 12/02/2022 with altered mental status in the setting of Remeron and EtOH use.  He completed a CT of the head that showed no evidence of acute intracranial changes.   Patient denies chest pain, palpitations, dyspnea, PND, orthopnea, nausea, vomiting, dizziness, syncope, edema, weight gain, or early satiety.   Discussed the use of AI scribe software for clinical note transcription with the patient, who gave verbal consent to proceed.  History of Present Illness    ***Notes: -Last ischemic evaluation:  Review of Systems  Please see the history of present illness.    All other systems reviewed and are otherwise negative except as noted above.  Physical Exam  Wt Readings from Last 3 Encounters:  12/02/22 156 lb 8.4 oz (71 kg)  11/19/20 157 lb 9.6 oz (71.5 kg)  10/08/20 173 lb 12.8 oz (78.8 kg)   ZO:XWRUE were no vitals filed for this visit.,There is no height or weight on file to calculate BMI. GEN: Well nourished, well developed in no acute distress Neck: No JVD; No carotid bruits Pulmonary: Clear to auscultation without rales, wheezing or rhonchi  Cardiovascular: Normal rate. Regular rhythm. Normal S1. Normal S2.   Murmurs: There is no murmur.  ABDOMEN: Soft, non-tender,  non-distended EXTREMITIES:  No edema; No deformity   EKG/LABS/ Recent Cardiac Studies   ECG personally reviewed by me today - ***  Risk Assessment/Calculations:   {Does this patient have ATRIAL FIBRILLATION?:[phone]}      Lab Results  Component Value Date   WBC 7.7 12/02/2022   HGB 15.3 12/02/2022   HCT 45.0 12/02/2022   MCV 95.4 12/02/2022   PLT 160 12/02/2022   Lab Results  Component Value Date   CREATININE 1.10 12/02/2022   BUN 13 12/02/2022   NA 138 12/02/2022   K 4.1 12/02/2022   CL 103 12/02/2022   CO2 25 12/02/2022   Lab Results  Component Value Date   CHOL 183 10/18/2017   HDL 84 10/18/2017   LDLCALC 83 10/18/2017   TRIG 79 10/18/2017   CHOLHDL 2.2 10/18/2017    Lab Results  Component Value Date   HGBA1C 4.8 01/11/2016   Assessment & Plan    1.HFimEF/NICM:   2.  Nonobstructive CAD  3.  Paroxysmal AF  4.  Hypertensive heart disease  5.  EtOH abuse:      Disposition: Follow-up with Avery Bodo, MD or APP in *** months {Are you ordering a CV Procedure (e.g. stress test, cath, DCCV, TEE, etc)?   Press F2        :[id number]}   Signed, Francene Ing, Retha Cast, NP 06/01/2023, 9:23 AM [facility]

## 2023-06-02 ENCOUNTER — Other Ambulatory Visit: Payer: Self-pay | Admitting: Nurse Practitioner

## 2023-06-02 ENCOUNTER — Encounter: Payer: Self-pay | Admitting: Nurse Practitioner

## 2023-06-02 ENCOUNTER — Ambulatory Visit: Attending: Nurse Practitioner | Admitting: Nurse Practitioner

## 2023-06-02 ENCOUNTER — Other Ambulatory Visit

## 2023-06-02 VITALS — BP 106/72 | HR 71 | Ht 67.0 in | Wt 163.8 lb

## 2023-06-02 DIAGNOSIS — I5032 Chronic diastolic (congestive) heart failure: Secondary | ICD-10-CM

## 2023-06-02 DIAGNOSIS — I10 Essential (primary) hypertension: Secondary | ICD-10-CM

## 2023-06-02 DIAGNOSIS — I428 Other cardiomyopathies: Secondary | ICD-10-CM

## 2023-06-02 DIAGNOSIS — F1010 Alcohol abuse, uncomplicated: Secondary | ICD-10-CM

## 2023-06-02 DIAGNOSIS — I2510 Atherosclerotic heart disease of native coronary artery without angina pectoris: Secondary | ICD-10-CM

## 2023-06-02 DIAGNOSIS — I480 Paroxysmal atrial fibrillation: Secondary | ICD-10-CM

## 2023-06-02 DIAGNOSIS — I5020 Unspecified systolic (congestive) heart failure: Secondary | ICD-10-CM

## 2023-06-02 DIAGNOSIS — R0989 Other specified symptoms and signs involving the circulatory and respiratory systems: Secondary | ICD-10-CM

## 2023-06-02 MED ORDER — CARVEDILOL 12.5 MG PO TABS: 12.5000 mg | ORAL_TABLET | Freq: Two times a day (BID) | ORAL | 3 refills | Status: DC

## 2023-06-02 NOTE — Patient Instructions (Addendum)
 Medication Instructions:  DECREASE Coreg  to 12.5mg  Take 1 tablet twice a day  *If you need a refill on your cardiac medications before your next appointment, please call your pharmacy*  Lab Work: None ordered If you have labs (blood work) drawn today and your tests are completely normal, you will receive your results only by: MyChart Message (if you have MyChart) OR A paper copy in the mail If you have any lab test that is abnormal or we need to change your treatment, we will call you to review the results.  Testing/Procedures: Your physician has requested that you have a carotid duplex. This test is an ultrasound of the carotid arteries in your neck. It looks at blood flow through these arteries that supply the brain with blood. Allow one hour for this exam. There are no restrictions or special instructions.  Your physician has requested that you have an echocardiogram. Echocardiography is a painless test that uses sound waves to create images of your heart. It provides your doctor with information about the size and shape of your heart and how well your heart's chambers and valves are working. This procedure takes approximately one hour. There are no restrictions for this procedure. Please do NOT wear cologne, perfume, aftershave, or lotions (deodorant is allowed). Please arrive 15 minutes prior to your appointment time.  Please note: We ask at that you not bring children with you during ultrasound (echo/ vascular) testing. Due to room size and safety concerns, children are not allowed in the ultrasound rooms during exams. Our front office staff cannot provide observation of children in our lobby area while testing is being conducted. An adult accompanying a patient to their appointment will only be allowed in the ultrasound room at the discretion of the ultrasound technician under special circumstances. We apologize for any inconvenience.  ZIO AT Long term monitor-Live Telemetry  Your  physician has requested you wear a ZIO patch monitor for 14 days.  This is a single patch monitor. [employer] supplies one patch monitor per enrollment. Additional  stickers are not available.  Please do not apply patch if you will be having a Nuclear Stress Test, Echocardiogram, Cardiac CT, MRI,  or Chest Xray during the period you would be wearing the monitor. The patch cannot be worn during  these tests. You cannot remove and re-apply the ZIO AT patch monitor.  Your ZIO patch monitor will be mailed 3 day USPS to your address on file. It may take 3-5 days to  receive your monitor after you have been enrolled.  Once you have received your monitor, please review the enclosed instructions. Your monitor has  already been registered assigning a specific monitor serial # to you.   Billing and Patient Assistance Program information  Sanna Crystal has been supplied with any insurance information on record for billing. [employer] offers a sliding scale Patient Assistance Program for patients without insurance, or whose  insurance does not completely cover the cost of the ZIO patch monitor. You must apply for the  Patient Assistance Program to qualify for the discounted rate. To apply, call [employer] at [phone],  select option 4, select option 2 , ask to apply for the Patient Assistance Program, (you can request an  interpreter if needed). [employer] will ask your household income and how many people are in your  household. [employer] will quote your out-of-pocket cost based on this information. They will also be able  to set up a 12 month interest free payment plan if needed.  Applying the monitor   Shave hair from upper left chest.  Hold the abrader disc by orange tab. Rub the abrader in 40 strokes over left upper chest as indicated in  your monitor instructions.  Clean area with 4 enclosed alcohol pads. Use all pads to ensure the area is cleaned thoroughly. Let  dry.  Apply patch as indicated in monitor  instructions. Patch will be placed under collarbone on left side of  chest with arrow pointing upward.  Rub patch adhesive wings for 2 minutes. Remove the white label marked "1". Remove the white label  marked "2". Rub patch adhesive wings for 2 additional minutes.  While looking in a mirror, press and release button in center of patch. A small green light will flash 3-4  times. This will be your only indicator that the monitor has been turned on.  Do not shower for the first 24 hours. You may shower after the first 24 hours.  Press the button if you feel a symptom. You will hear a small click. Record Date, Time and Symptom in  the Patient Log.   Starting the Gateway  In your kit there is a Audiological scientist box the size of a cellphone. This is Buyer, retail. It transmits all your  recorded data to [facility]. This box must always stay within 10 feet of you. Open the box and push the *  button. There will be a light that blinks orange and then green a few times. When the light stops  blinking, the Gateway is connected to the ZIO patch. Call [employer] at [phone] to confirm your monitor is transmitting.  Returning your monitor  Remove your patch and place it inside the Gateway. In the lower half of the Gateway there is a white  bag with prepaid postage on it. Place Gateway in bag and seal. Mail package back to [place] as soon as  possible. Your physician should have your final report approximately 7 days after you have mailed back  your monitor. Call [facility] Customer Care at [phone] if you have questions regarding your ZIO AT  patch monitor. Call them immediately if you see an orange light blinking on your monitor.  If your monitor falls off in less than 4 days, contact our Monitor department at [phone]. If your  monitor becomes loose or falls off after 4 days call [employer] at [phone] for suggestions on  securing your monitor   Follow-Up: At [facility], you and your health needs are our priority.  As part of our continuing mission to provide you with exceptional heart care, our providers are all part of one team.  This team includes your primary Cardiologist (physician) and Advanced Practice Providers or APPs (Physician Assistants and Nurse Practitioners) who all work together to provide you with the care you need, when you need it.  Your next appointment:   2 month(s)  Provider:   Charles Connor, NP Then, Christena Covert, MD will plan to see you again in 6 month(s).    We recommend signing up for the patient portal called "MyChart".  Sign up information is provided on this After Visit Summary.  MyChart is used to connect with patients for Virtual Visits (Telemedicine).  Patients are able to view lab/test results, encounter notes, upcoming appointments, etc.  Non-urgent messages can be sent to your provider as well.   To learn more about what you can do with MyChart, go to [email or web address].   Other Instructions  1st Floor: - Lobby - Registration  - Pharmacy  - Lab - Cafe  2nd Floor: - PV Lab - Diagnostic Testing (echo, CT, nuclear med)  3rd Floor: - Vacant  4th Floor: - TCTS (cardiothoracic surgery) - AFib Clinic - Structural Heart Clinic - Vascular Surgery  - Vascular Ultrasound  5th Floor: - HeartCare Cardiology (general and EP) - Clinical Pharmacy for coumadin, hypertension, lipid, weight-loss medications, and med management appointments    Valet parking services will be available as well.

## 2023-06-02 NOTE — Progress Notes (Unsigned)
 ZIO AT serial # [id number] from office inventory applied to patient.  DOD to read.

## 2023-06-02 NOTE — Addendum Note (Signed)
 Addended by: Jamal Mays on: 06/02/2023 01:45 PM   Modules accepted: Orders

## 2023-06-03 ENCOUNTER — Telehealth (HOSPITAL_BASED_OUTPATIENT_CLINIC_OR_DEPARTMENT_OTHER): Payer: Self-pay | Admitting: Licensed Clinical Social Worker

## 2023-06-03 NOTE — Telephone Encounter (Signed)
 H&V Care Navigation CSW Progress Note  Clinical Social Worker contacted patient by phone to f/u on referral for ETOH Cessation resources. No answer this morning at [phone], left voicemail. Will re-attempt again as able.  Patient is participating in a Managed Medicaid Plan:  No, UHC Medicare and Medicaid  SDOH Screenings   Food Insecurity: No Food Insecurity (06/02/2021)  Housing: Low Risk  (06/02/2021)  Transportation Needs: No Transportation Needs (06/02/2021)  Alcohol Screen: Low Risk  (11/06/2019)  Depression (PHQ2-9): Low Risk  (09/26/2020)  Financial Resource Strain: Low Risk  (11/06/2019)  Physical Activity: Inactive (11/06/2019)  Social Connections: Unknown (11/06/2019)  Stress: No Stress Concern Present (11/06/2019)  Tobacco Use: High Risk (06/02/2023)    Nathen Balder, MSW, LCSW Clinical Social Worker II [facility] Health Heart/Vascular Care Navigation  [phone]- work cell phone (preferred) [phone]- desk phone

## 2023-06-06 ENCOUNTER — Telehealth: Payer: Self-pay | Admitting: Licensed Clinical Social Worker

## 2023-06-06 NOTE — Telephone Encounter (Signed)
 H&V Care Navigation CSW Progress Note  Clinical Social Worker contacted patient by phone to f/u on referral for ETOH Cessation resources. No answer this morning at [phone], left voicemail. Will re-attempt again as able.   Patient is participating in a Managed Medicaid Plan:  No, UHC Medicare and Medicaid.   SDOH Screenings   Food Insecurity: No Food Insecurity (06/02/2021)  Housing: Low Risk  (06/02/2021)  Transportation Needs: No Transportation Needs (06/02/2021)  Alcohol Screen: Low Risk  (11/06/2019)  Depression (PHQ2-9): Low Risk  (09/26/2020)  Financial Resource Strain: Low Risk  (11/06/2019)  Physical Activity: Inactive (11/06/2019)  Social Connections: Unknown (11/06/2019)  Stress: No Stress Concern Present (11/06/2019)  Tobacco Use: High Risk (06/02/2023)    Nathen Balder, MSW, LCSW Clinical Social Worker II [facility] Health Heart/Vascular Care Navigation  [phone]- work cell phone (preferred)

## 2023-06-07 ENCOUNTER — Telehealth: Payer: Self-pay | Admitting: Licensed Clinical Social Worker

## 2023-06-07 NOTE — Telephone Encounter (Signed)
 H&V Care Navigation CSW Progress Note  Clinical Social Worker contacted patient by phone to f/u on recommendation for community resources for ETOH cessation. No answer, third voicemail left for pt, due to sensitive nature of resources I will hold off on mailing anything unless pt calls me back. Our team remains available as needed.   Patient is participating in a Managed Medicaid Plan:  No, UHC Medicare and Medicare  SDOH Screenings   Food Insecurity: No Food Insecurity (06/02/2021)  Housing: Low Risk  (06/02/2021)  Transportation Needs: No Transportation Needs (06/02/2021)  Alcohol Screen: Low Risk  (11/06/2019)  Depression (PHQ2-9): Low Risk  (09/26/2020)  Financial Resource Strain: Low Risk  (11/06/2019)  Physical Activity: Inactive (11/06/2019)  Social Connections: Unknown (11/06/2019)  Stress: No Stress Concern Present (11/06/2019)  Tobacco Use: High Risk (06/02/2023)    Nathen Balder, MSW, LCSW Clinical Social Worker II [facility] Health Heart/Vascular Care Navigation  [phone]- work cell phone (preferred)

## 2023-06-10 ENCOUNTER — Inpatient Hospital Stay (HOSPITAL_COMMUNITY)
Admission: EM | Admit: 2023-06-10 | Discharge: 2023-06-14 | DRG: 439 | Disposition: A | Discharge status: Home / Self Care | Attending: Family Medicine | Admitting: Family Medicine

## 2023-06-10 ENCOUNTER — Other Ambulatory Visit: Payer: Self-pay

## 2023-06-10 ENCOUNTER — Emergency Department (HOSPITAL_COMMUNITY)

## 2023-06-10 DIAGNOSIS — Z9079 Acquired absence of other genital organ(s): Secondary | ICD-10-CM

## 2023-06-10 DIAGNOSIS — Z720 Tobacco use: Secondary | ICD-10-CM | POA: Diagnosis present

## 2023-06-10 DIAGNOSIS — K859 Acute pancreatitis without necrosis or infection, unspecified: Secondary | ICD-10-CM

## 2023-06-10 DIAGNOSIS — E782 Mixed hyperlipidemia: Secondary | ICD-10-CM | POA: Diagnosis present

## 2023-06-10 DIAGNOSIS — N182 Chronic kidney disease, stage 2 (mild): Secondary | ICD-10-CM | POA: Diagnosis present

## 2023-06-10 DIAGNOSIS — I160 Hypertensive urgency: Secondary | ICD-10-CM | POA: Diagnosis present

## 2023-06-10 DIAGNOSIS — J4489 Other specified chronic obstructive pulmonary disease: Secondary | ICD-10-CM | POA: Diagnosis present

## 2023-06-10 DIAGNOSIS — F1010 Alcohol abuse, uncomplicated: Secondary | ICD-10-CM | POA: Diagnosis present

## 2023-06-10 DIAGNOSIS — R17 Unspecified jaundice: Secondary | ICD-10-CM | POA: Diagnosis present

## 2023-06-10 DIAGNOSIS — Z791 Long term (current) use of non-steroidal anti-inflammatories (NSAID): Secondary | ICD-10-CM

## 2023-06-10 DIAGNOSIS — I080 Rheumatic disorders of both mitral and aortic valves: Secondary | ICD-10-CM | POA: Diagnosis present

## 2023-06-10 DIAGNOSIS — I701 Atherosclerosis of renal artery: Secondary | ICD-10-CM | POA: Diagnosis present

## 2023-06-10 DIAGNOSIS — I480 Paroxysmal atrial fibrillation: Secondary | ICD-10-CM | POA: Diagnosis present

## 2023-06-10 DIAGNOSIS — R1013 Epigastric pain: Principal | ICD-10-CM

## 2023-06-10 DIAGNOSIS — Z8249 Family history of ischemic heart disease and other diseases of the circulatory system: Secondary | ICD-10-CM

## 2023-06-10 DIAGNOSIS — Z9049 Acquired absence of other specified parts of digestive tract: Secondary | ICD-10-CM

## 2023-06-10 DIAGNOSIS — I10 Essential (primary) hypertension: Secondary | ICD-10-CM | POA: Diagnosis present

## 2023-06-10 DIAGNOSIS — K852 Alcohol induced acute pancreatitis without necrosis or infection: Secondary | ICD-10-CM | POA: Diagnosis present

## 2023-06-10 DIAGNOSIS — I130 Hypertensive heart and chronic kidney disease with heart failure and stage 1 through stage 4 chronic kidney disease, or unspecified chronic kidney disease: Secondary | ICD-10-CM | POA: Diagnosis present

## 2023-06-10 DIAGNOSIS — Z79899 Other long term (current) drug therapy: Secondary | ICD-10-CM | POA: Diagnosis not present

## 2023-06-10 DIAGNOSIS — Z888 Allergy status to other drugs, medicaments and biological substances status: Secondary | ICD-10-CM

## 2023-06-10 DIAGNOSIS — Z7951 Long term (current) use of inhaled steroids: Secondary | ICD-10-CM | POA: Diagnosis not present

## 2023-06-10 DIAGNOSIS — I2510 Atherosclerotic heart disease of native coronary artery without angina pectoris: Secondary | ICD-10-CM | POA: Diagnosis present

## 2023-06-10 DIAGNOSIS — H409 Unspecified glaucoma: Secondary | ICD-10-CM | POA: Diagnosis present

## 2023-06-10 DIAGNOSIS — K860 Alcohol-induced chronic pancreatitis: Secondary | ICD-10-CM | POA: Diagnosis present

## 2023-06-10 DIAGNOSIS — M109 Gout, unspecified: Secondary | ICD-10-CM | POA: Diagnosis present

## 2023-06-10 DIAGNOSIS — F17210 Nicotine dependence, cigarettes, uncomplicated: Secondary | ICD-10-CM | POA: Diagnosis present

## 2023-06-10 DIAGNOSIS — Z9181 History of falling: Secondary | ICD-10-CM

## 2023-06-10 DIAGNOSIS — R001 Bradycardia, unspecified: Secondary | ICD-10-CM | POA: Diagnosis present

## 2023-06-10 DIAGNOSIS — I7121 Aneurysm of the ascending aorta, without rupture: Secondary | ICD-10-CM | POA: Diagnosis present

## 2023-06-10 DIAGNOSIS — Z91148 Patient's other noncompliance with medication regimen for other reason: Secondary | ICD-10-CM

## 2023-06-10 DIAGNOSIS — I5022 Chronic systolic (congestive) heart failure: Secondary | ICD-10-CM | POA: Diagnosis present

## 2023-06-10 DIAGNOSIS — I441 Atrioventricular block, second degree: Secondary | ICD-10-CM | POA: Diagnosis present

## 2023-06-10 DIAGNOSIS — Z8546 Personal history of malignant neoplasm of prostate: Secondary | ICD-10-CM

## 2023-06-10 DIAGNOSIS — I428 Other cardiomyopathies: Secondary | ICD-10-CM | POA: Diagnosis present

## 2023-06-10 LAB — URINALYSIS, ROUTINE W REFLEX MICROSCOPIC
Bacteria, UA: NONE SEEN
Bilirubin Urine: NEGATIVE
Glucose, UA: NEGATIVE mg/dL
Ketones, ur: NEGATIVE mg/dL
Leukocytes,Ua: NEGATIVE
Nitrite: NEGATIVE
Protein, ur: 100 mg/dL — AB
Specific Gravity, Urine: 1.023 (ref 1.005–1.030)
pH: 6 (ref 5.0–8.0)

## 2023-06-10 LAB — CBC
HCT: 42.8 % (ref 39.0–52.0)
Hemoglobin: 15.2 g/dL (ref 13.0–17.0)
MCH: 34.4 pg — ABNORMAL HIGH (ref 26.0–34.0)
MCHC: 35.5 g/dL (ref 30.0–36.0)
MCV: 96.8 fL (ref 80.0–100.0)
Platelets: 194 10*3/uL (ref 150–400)
RBC: 4.42 MIL/uL (ref 4.22–5.81)
RDW: 11.8 % (ref 11.5–15.5)
WBC: 11.1 10*3/uL — ABNORMAL HIGH (ref 4.0–10.5)
nRBC: 0 % (ref 0.0–0.2)

## 2023-06-10 LAB — COMPREHENSIVE METABOLIC PANEL WITH GFR
ALT: 16 U/L (ref 0–44)
AST: 27 U/L (ref 15–41)
Albumin: 4.4 g/dL (ref 3.5–5.0)
Alkaline Phosphatase: 61 U/L (ref 38–126)
Anion gap: 10 (ref 5–15)
BUN: 20 mg/dL (ref 8–23)
CO2: 26 mmol/L (ref 22–32)
Calcium: 9.6 mg/dL (ref 8.9–10.3)
Chloride: 102 mmol/L (ref 98–111)
Creatinine, Ser: 1.01 mg/dL (ref 0.61–1.24)
GFR, Estimated: >60 mL/min (ref >60)
Glucose, Bld: 120 mg/dL — ABNORMAL HIGH (ref 70–99)
Potassium: 4 mmol/L (ref 3.5–5.1)
Sodium: 138 mmol/L (ref 135–145)
Total Bilirubin: 1.4 mg/dL — ABNORMAL HIGH (ref 0.0–1.2)
Total Protein: 8 g/dL (ref 6.5–8.1)

## 2023-06-10 LAB — MAGNESIUM: Magnesium: 2.2 mg/dL (ref 1.7–2.4)

## 2023-06-10 LAB — LIPASE, BLOOD: Lipase: 354 U/L — ABNORMAL HIGH (ref 11–51)

## 2023-06-10 LAB — PHOSPHORUS: Phosphorus: 3 mg/dL (ref 2.5–4.6)

## 2023-06-10 MED ORDER — THIAMINE HCL 100 MG/ML IJ SOLN
100.0000 mg | Freq: Every day | INTRAMUSCULAR | Status: DC
  Administered 2023-06-10: 100 mg via INTRAVENOUS
  Filled 2023-06-10: qty 2

## 2023-06-10 MED ORDER — CARVEDILOL 25 MG PO TABS
25.0000 mg | ORAL_TABLET | Freq: Two times a day (BID) | ORAL | Status: DC
  Administered 2023-06-10 – 2023-06-14 (×9): 25 mg via ORAL
  Filled 2023-06-10 (×2): qty 1
  Filled 2023-06-10: qty 2
  Filled 2023-06-10 (×6): qty 1

## 2023-06-10 MED ORDER — PHENOBARBITAL 32.4 MG PO TABS
64.8000 mg | ORAL_TABLET | Freq: Two times a day (BID) | ORAL | Status: AC
  Administered 2023-06-10 (×2): 64.8 mg via ORAL
  Filled 2023-06-10 (×2): qty 2

## 2023-06-10 MED ORDER — FOLIC ACID 1 MG PO TABS
1.0000 mg | ORAL_TABLET | Freq: Every day | ORAL | Status: DC
  Administered 2023-06-11 – 2023-06-14 (×4): 1 mg via ORAL
  Filled 2023-06-10 (×4): qty 1

## 2023-06-10 MED ORDER — LORAZEPAM 1 MG PO TABS: 0.0000 mg | ORAL_TABLET | Freq: Two times a day (BID) | ORAL | Status: DC

## 2023-06-10 MED ORDER — HYDROMORPHONE HCL 1 MG/ML IJ SOLN
0.5000 mg | INTRAMUSCULAR | Status: DC | PRN
  Administered 2023-06-10 – 2023-06-14 (×16): 0.5 mg via INTRAVENOUS
  Filled 2023-06-10 (×17): qty 0.5

## 2023-06-10 MED ORDER — LORAZEPAM 1 MG PO TABS
1.0000 mg | ORAL_TABLET | ORAL | Status: DC | PRN
  Administered 2023-06-11: 1 mg via ORAL
  Filled 2023-06-10: qty 1

## 2023-06-10 MED ORDER — HYDRALAZINE HCL 20 MG/ML IJ SOLN
20.0000 mg | INTRAMUSCULAR | Status: DC | PRN
  Administered 2023-06-10 – 2023-06-12 (×4): 20 mg via INTRAVENOUS
  Filled 2023-06-10 (×5): qty 1

## 2023-06-10 MED ORDER — HYDROMORPHONE HCL 1 MG/ML IJ SOLN: 1.0000 mg | INTRAMUSCULAR | Status: DC | PRN

## 2023-06-10 MED ORDER — ACETAMINOPHEN 650 MG RE SUPP: 650.0000 mg | Freq: Four times a day (QID) | RECTAL | Status: DC | PRN

## 2023-06-10 MED ORDER — MAGNESIUM SULFATE 2 GM/50ML IV SOLN
2.0000 g | Freq: Once | INTRAVENOUS | Status: AC
  Administered 2023-06-10: 2 g via INTRAVENOUS
  Filled 2023-06-10: qty 50

## 2023-06-10 MED ORDER — HYDROMORPHONE HCL 1 MG/ML IJ SOLN
1.0000 mg | Freq: Once | INTRAMUSCULAR | Status: AC
  Administered 2023-06-10: 1 mg via INTRAVENOUS
  Filled 2023-06-10: qty 1

## 2023-06-10 MED ORDER — LORAZEPAM 1 MG PO TABS
0.0000 mg | ORAL_TABLET | Freq: Four times a day (QID) | ORAL | Status: DC
  Administered 2023-06-10 – 2023-06-12 (×3): 1 mg via ORAL
  Filled 2023-06-10 (×3): qty 1

## 2023-06-10 MED ORDER — AMLODIPINE BESYLATE 5 MG PO TABS
5.0000 mg | ORAL_TABLET | Freq: Every day | ORAL | Status: DC
  Administered 2023-06-10 – 2023-06-14 (×5): 5 mg via ORAL
  Filled 2023-06-10 (×5): qty 1

## 2023-06-10 MED ORDER — SODIUM CHLORIDE 0.9 % IV SOLN: INTRAVENOUS | Status: DC

## 2023-06-10 MED ORDER — ONDANSETRON HCL 4 MG/2ML IJ SOLN: 4.0000 mg | Freq: Four times a day (QID) | INTRAMUSCULAR | Status: DC | PRN

## 2023-06-10 MED ORDER — PHENOBARBITAL 32.4 MG PO TABS: 16.2000 mg | ORAL_TABLET | Freq: Two times a day (BID) | ORAL | Status: DC

## 2023-06-10 MED ORDER — ONDANSETRON HCL 4 MG/2ML IJ SOLN
4.0000 mg | Freq: Once | INTRAMUSCULAR | Status: AC
  Administered 2023-06-10: 4 mg via INTRAVENOUS
  Filled 2023-06-10: qty 2

## 2023-06-10 MED ORDER — NICOTINE 21 MG/24HR TD PT24
21.0000 mg | MEDICATED_PATCH | Freq: Every day | TRANSDERMAL | Status: DC | PRN
  Administered 2023-06-10: 21 mg via TRANSDERMAL
  Filled 2023-06-10: qty 1

## 2023-06-10 MED ORDER — PHENOBARBITAL 32.4 MG PO TABS
32.4000 mg | ORAL_TABLET | Freq: Two times a day (BID) | ORAL | Status: DC
  Administered 2023-06-11: 32.4 mg via ORAL
  Filled 2023-06-10: qty 1

## 2023-06-10 MED ORDER — ONDANSETRON HCL 4 MG PO TABS: 4.0000 mg | ORAL_TABLET | Freq: Four times a day (QID) | ORAL | Status: DC | PRN

## 2023-06-10 MED ORDER — PANTOPRAZOLE SODIUM 40 MG IV SOLR
40.0000 mg | INTRAVENOUS | Status: DC
  Administered 2023-06-10 – 2023-06-13 (×4): 40 mg via INTRAVENOUS
  Filled 2023-06-10 (×4): qty 10

## 2023-06-10 MED ORDER — ADULT MULTIVITAMIN W/MINERALS CH
1.0000 | ORAL_TABLET | Freq: Every day | ORAL | Status: DC
Start: 2023-06-10 — End: 2023-06-14
  Administered 2023-06-11 – 2023-06-14 (×4): 1 via ORAL
  Filled 2023-06-10 (×4): qty 1

## 2023-06-10 MED ORDER — THIAMINE MONONITRATE 100 MG PO TABS
100.0000 mg | ORAL_TABLET | Freq: Every day | ORAL | Status: DC
  Administered 2023-06-11 – 2023-06-14 (×4): 100 mg via ORAL
  Filled 2023-06-10 (×4): qty 1

## 2023-06-10 MED ORDER — ACETAMINOPHEN 325 MG PO TABS
650.0000 mg | ORAL_TABLET | Freq: Four times a day (QID) | ORAL | Status: DC | PRN
  Administered 2023-06-11: 650 mg via ORAL
  Filled 2023-06-10: qty 2

## 2023-06-10 MED ORDER — IOHEXOL 300 MG/ML  SOLN
100.0000 mL | Freq: Once | INTRAMUSCULAR | Status: AC | PRN
  Administered 2023-06-10: 100 mL via INTRAVENOUS

## 2023-06-10 MED ORDER — LORAZEPAM 2 MG/ML IJ SOLN: 1.0000 mg | INTRAMUSCULAR | Status: DC | PRN

## 2023-06-10 NOTE — H&P (Signed)
 History and Physical    Patient: Leonard [name] OZH:[id number] DOB: September 19, 1950 DOA: 06/10/2023 DOS: the patient was seen and examined on 06/10/2023 PCP: Jonathon Neighbors, MD  Patient coming from: Home  Chief Complaint:  Chief Complaint  Patient presents with   Abdominal Pain    Pt arrives via ems from home for c/o abd pain that started approx 2 days ago with assoc n/v/dx3 episodes. Pt has hx of this 2 years ago and dx c galbladder issues (unsure of sludge or stones). HTN 200s/100, 9/10 pain across lower quads. Pt also reports fall two weeks ago with headstrike, no loc, no thinners    HPI: Leonard [name] is a 73 y.o. male with medical history significant of alcohol abuse, arctic insufficiency, asthma, CKD, gout, chronic systolic heart failure, hypertension, mitral regurgitation, nonischemic cardiomyopathy, paroxysmal atrial fibrillation, history of prostate cancer, renal artery stenosis, tobacco abuse who presented to the emergency department with complaints of abdominal pain associated with nausea and emesis.  He has been drinking close to a gallon of vodka weekly.  No constipation, melena or hematochezia.  No flank pain, dysuria, frequency or hematuria He denied fever, chills, rhinorrhea, sore throat, wheezing or hemoptysis.  No chest pain, palpitations, diaphoresis, PND, orthopnea or pitting edema of the lower extremities..  No polyuria, polydipsia, polyphagia or blurred vision.   Lab work: Urinalysis show moderate hemoglobin and protein 100 mg/dL.  CBC with a white count 11.1, hemoglobin 15.2 g/dL and platelets 629.  Lipase was 354 units/L.  CMP was normal with the section of a glucose of 120 and total bilirubin of 1.4 mg/dL.  Imaging: CT abdomen/pelvis with contrast showing edema/inflammation around the pancreatic head and to a lesser degree along the body and tail of the pancreas.  These changes track into the pancreaticoduodenal groove and around that the sending and proximal transverse colon.   Imaging suggesting acute on chronic pancreatitis.  Cholelithiasis.  Heterogenous wall thickening of the gallbladder fundus, similar to prior and evaluated by [facility] and September 2022 when it was characterized as most likely representing adenomyomatosis dating back to 2015.  Aortic atherosclerosis.   ED course: Initial vital signs were temperature 98.3 F, pulse 80, respiration 18, BP 200/90 mmHg O2 sat 98% on room air.  Patient received hydromorphone  1 mg IVP and ondansetron  4 mg IVP.  Review of Systems: As mentioned in the history of present illness. All other systems reviewed and are negative.  Past Medical History:  Diagnosis Date   Alcohol abuse    Aortic insufficiency    Asthma    CAD (coronary artery disease)    a. cath 2018 with nonbstructive CAD (40% prox RCA, 25% distal RCA, 25% ramus, 20% mid LAD.   CKD (chronic kidney disease) stage 2, GFR 60-89 ml/min    COPD (chronic obstructive pulmonary disease) (HCC)    Gout    HFrEF (heart failure with reduced ejection fraction) (HCC)    Hypertension    Mitral regurgitation    NICM (nonischemic cardiomyopathy) (HCC)    PAF (paroxysmal atrial fibrillation) (HCC)    Prostate cancer (HCC)    Renal artery stenosis (HCC)    a. mild left renal artery stenosis by cath 2018.   Tobacco abuse    Past Surgical History:  Procedure Laterality Date   APPENDECTOMY     HYDROCELE EXCISION Left 12/01/2015   Procedure: REPAIR OF LEFT HYDROCELE;  Surgeon: Florencio Hunting, MD;  Location: [facility]S;  Service: Urology;  Laterality: Left;   LYMPHADENECTOMY Bilateral 09/24/2013  Procedure: LYMPHADENECTOMY;  Surgeon: Florencio Hunting, MD;  Location: [facility];  Service: Urology;  Laterality: Bilateral;   PROSTATE BIOPSY     RIGHT/LEFT HEART CATH AND CORONARY ANGIOGRAPHY N/A 06/10/2016   Procedure: Right/Left Heart Cath and Coronary Angiography;  Surgeon: Lucendia Rusk, MD;  Location: [facility] INVASIVE CV LAB;  Service: Cardiovascular;  Laterality: N/A;   ROBOT  ASSISTED LAPAROSCOPIC RADICAL PROSTATECTOMY N/A 09/24/2013   Procedure: ROBOTIC ASSISTED LAPAROSCOPIC RADICAL PROSTATECTOMY LEVEL 2;  Surgeon: Florencio Hunting, MD;  Location: [facility];  Service: Urology;  Laterality: N/A;   Social History:  reports that he has been smoking cigarettes. He started smoking about 37 years ago. He has a 7.5 pack-year smoking history. He has never used smokeless tobacco. He reports current alcohol use of about 6.0 - 7.0 standard drinks of alcohol per week. He reports that he does not use drugs.  Allergies  Allergen Reactions   Losartan  Rash and Other (See Comments)    Abdominal and leg rashes noted in 12/17    Family History  Problem Relation Age of Onset   Hypertension Sister    Cancer Neg Hx     Prior to Admission medications   Medication Sig Start Date End Date Taking? Authorizing Provider  gabapentin (NEURONTIN) 100 MG capsule Take 100 mg by mouth at bedtime.   Yes [provider]  albuterol  (PROVENTIL  HFA;VENTOLIN  HFA) 108 (90 Base) MCG/ACT inhaler Inhale 2 puffs into the lungs every 4 (four) hours as needed for wheezing or shortness of breath. 08/10/17   Horton, Vonzella Guernsey, MD  albuterol  (PROVENTIL ) (2.5 MG/3ML) 0.083% nebulizer solution Take 3 mLs (2.5 mg total) by nebulization every 6 (six) hours as needed for wheezing or shortness of breath. 01/19/16   Roselle Conner, MD  allopurinol (ZYLOPRIM) 100 MG tablet Take 100 mg by mouth daily. 01/11/21   [provider]  amLODipine  (NORVASC ) 5 MG tablet Take 1 tablet by mouth daily 04/25/18   Flo Hummingbird, PA-C  carvedilol  (COREG ) 12.5 MG tablet Take 1 tablet (12.5 mg total) by mouth 2 (two) times daily. Patient taking differently: Take 25 mg by mouth 2 (two) times daily. 06/02/23 08/31/23  Gerald Kitty., NP  Cholecalciferol (VITAMIN D3) 50 MCG (2000 UT) capsule Take 2,000 Units by mouth daily. 01/11/21   [provider]  dorzolamide -timolol  (COSOPT ) 22.3-6.8 MG/ML ophthalmic solution  Place 1 drop into both eyes 2 (two) times daily. 08/05/18   [provider]  ENTRESTO  97-103 MG Take 1 tablet by mouth 2 (two) times daily. 10/04/18   [provider]  furosemide  (LASIX ) 40 MG tablet Take 1 tablet (40 mg total) by mouth daily. You may take an extra tablet in the PM AS NEEDED for swelling up to twice a week 09/25/19   Lucendia Rusk, MD  latanoprost  (XALATAN ) 0.005 % ophthalmic solution Place 1 drop into both eyes at bedtime.  03/04/16   [provider]  meloxicam  (MOBIC ) 15 MG tablet Take 1 tablet (15 mg total) by mouth daily. 11/19/20   Sikora, Rebecca, DPM  meloxicam  (MOBIC ) 7.5 MG tablet Take 1 tablet (7.5 mg total) by mouth daily. 04/22/22   Standiford, Karlene Overcast, DPM  methylPREDNISolone  (MEDROL ) 4 MG tablet Take by mouth. 04/23/22   [provider]  mirtazapine (REMERON) 15 MG tablet Take 15 mg by mouth at bedtime. 04/20/22   [provider]  mirtazapine (REMERON) 7.5 MG tablet Take 7.5 mg by mouth at bedtime. 04/08/22   [provider]  potassium chloride  (KLOR-CON ) 10 MEQ tablet Take 1 tablet (10 mEq total) by mouth daily. 11/21/20 11/21/21  Marlyse Single T, PA-C  SYMBICORT 160-4.5 MCG/ACT inhaler Inhale 1 puff into the lungs daily as needed (shortness of breath). 12/08/17   [provider]  urea  (CARMOL) 10 % cream Apply topically as needed. 05/06/22   Evertt Hoe, DPM    Physical Exam: Vitals:   06/10/23 0815 06/10/23 0830 06/10/23 0845 06/10/23 0945  BP: (!) 198/94 (!) 170/82 (!) 178/87 (!) 184/109  Pulse: 68 73 72 76  Resp: 18 15 14 17   Temp:      TempSrc:      SpO2: 97% 90% 91% 94%   Physical Exam Vitals and nursing note reviewed.  Constitutional:      General: He is awake. He is not in acute distress.    Appearance: He is ill-appearing.  HENT:     Head: Normocephalic.     Nose: No rhinorrhea.     Mouth/Throat:     Mouth: Mucous membranes are dry.  Eyes:     General: No scleral  icterus.    Pupils: Pupils are equal, round, and reactive to light.  Neck:     Vascular: No JVD.  Cardiovascular:     Rate and Rhythm: Normal rate and regular rhythm.     Heart sounds: S1 normal and S2 normal.  Pulmonary:     Effort: Pulmonary effort is normal.     Breath sounds: Normal breath sounds. No wheezing, rhonchi or rales.  Abdominal:     General: Bowel sounds are normal.     Palpations: Abdomen is soft.     Tenderness: There is abdominal tenderness in the right upper quadrant and epigastric area.  Musculoskeletal:     Cervical back: Neck supple.     Right lower leg: No edema.     Left lower leg: No edema.  Skin:    General: Skin is warm and dry.  Neurological:     General: No focal deficit present.     Mental Status: He is alert and oriented to person, place, and time.  Psychiatric:        Mood and Affect: Mood normal.        Behavior: Behavior normal. Behavior is cooperative.     Data Reviewed:  Results are pending, will review when available.  EKG: Vent. rate 74 BPM PR interval 203 ms QRS duration 85 ms QT/QTcB 402/446 ms P-R-T axes 76 -16 61 Sinus rhythm Borderline left axis deviation Abnormal R-wave progression, early transition Consider anterior infarct Minimal ST depression, lateral leads  Assessment and Plan: Principal Problem:   Alcohol induced acute pancreatitis Telemetry/inpatient.. Continue IV fluids. Keep n.p.o. for now. Analgesics as needed. Antiemetics as needed. Pantoprazole  40 mg IVP daily. Follow CBC, CMP and lipase in AM.  Active Problems:   Alcohol abuse With associated:   Hyperbilirubinemia CIWA protocol with lorazepam . Phenobarbital  3-day taper. Magnesium  sulfate supplementation. Folate, MVI and thiamine . Consult TOC team. Alcohol cessation advised.    Essential hypertension Continue amlodipine  5 mg p.o. daily. Continue carvedilol  25 mg p.o. twice daily. Hydralazine  IV as needed.    Tobacco abuse Tobacco cessation  advised. Nicotine  replacement therapy ordered.    Mixed hyperlipidemia Deferred therapy with statin for now.    Glaucoma Currently not on eyedrops. Follow-up with ophthalmology as an outpatient.    CAD (coronary artery disease) History of cardiomyopathy. Continue carvedilol  as above. No longer taking Entresto .    Paroxysmal atrial fibrillation (  HCC) CHA?DS?-VASc Score of at least 4. Not on anticoagulation. Continue carvedilol  for rate control.     Advance Care Planning:   Code Status: Full Code   Consults:   Family Communication:   Severity of Illness: The appropriate patient status for this patient is INPATIENT. Inpatient status is judged to be reasonable and necessary in order to provide the required intensity of service to ensure the patient's safety. The patient's presenting symptoms, physical exam findings, and initial radiographic and laboratory data in the context of their chronic comorbidities is felt to place them at high risk for further clinical deterioration. Furthermore, it is not anticipated that the patient will be medically stable for discharge from the hospital within 2 midnights of admission.   * I certify that at the point of admission it is my clinical judgment that the patient will require inpatient hospital care spanning beyond 2 midnights from the point of admission due to high intensity of service, high risk for further deterioration and high frequency of surveillance required.*  Author: Danice Dural, MD 06/10/2023 10:36 AM  For on call review [email or web address].   This document was prepared using Dragon voice recognition software and may contain some unintended transcription errors.

## 2023-06-10 NOTE — ED Notes (Signed)
 Medicated according to mar, given warm blankets per request, in nad at this time

## 2023-06-10 NOTE — Plan of Care (Signed)

## 2023-06-10 NOTE — ED Notes (Signed)
 Patient is resting comfortably.

## 2023-06-10 NOTE — ED Notes (Signed)
Pt in nad at this time.

## 2023-06-10 NOTE — ED Provider Notes (Addendum)
 [place] [facility]ERGENCY DEPARTMENT AT [facility] Provider Note   CSN: [id number] Arrival date & time: 06/10/23  0730     History  Chief Complaint  Patient presents with   Abdominal Pain    Pt arrives via ems from home for c/o abd pain that started approx 2 days ago with assoc n/v/dx3 episodes. Pt has hx of this 2 years ago and dx c galbladder issues (unsure of sludge or stones). HTN 200s/100, 9/10 pain across lower quads. Pt also reports fall two weeks ago with headstrike, no loc, no thinners     Leonard [name] is a 73 y.o. male.  Patient with a complaint periumbilical epigastric abdominal pain starting yesterday.  Associated with nausea vomiting and diarrhea x 3 episodes.  Patient had similar problems 2 years ago and it was related to gallbladder problems.  Patient is followed by cardiology has a history of atrial fibs ascending aneurysm of the aorta alcohol abuse heart failure.  Last seen by cards April 24 of this year.  Vital signs here temp 98.3 blood pressure 200/90 oxygen sats 98% pulse 80.  Patient does not have any chest pain.  Patient is on Coreg  and Norvasc  and Lasix  and uses an albuterol  inhaler.  Patient not on a blood thinner.  Past medical history significant for prostate cancer hypertension asthma renal artery stenosis heart failure nonischemic cardiomyopathy paroxysmal atrial fibrillation alcohol abuse tobacco abuse aortic insufficiency mitral regurgitation COPD and chronic kidney disease stage II.  Past surgical history significant for prostate biopsy see appendectomy robot-assisted radical prostatectomy heart cath in 2018 patient's everyday smoker last tried to quit in 2017.  Patient does admit to drinking 2 days ago.  No alcohol since then.       Home Medications Prior to Admission medications   Medication Sig Start Date End Date Taking? Authorizing Provider  gabapentin (NEURONTIN) 100 MG capsule Take 100 mg by mouth at bedtime.   Yes [provider]   albuterol  (PROVENTIL  HFA;VENTOLIN  HFA) 108 (90 Base) MCG/ACT inhaler Inhale 2 puffs into the lungs every 4 (four) hours as needed for wheezing or shortness of breath. 08/10/17   Horton, Vonzella Guernsey, MD  albuterol  (PROVENTIL ) (2.5 MG/3ML) 0.083% nebulizer solution Take 3 mLs (2.5 mg total) by nebulization every 6 (six) hours as needed for wheezing or shortness of breath. 01/19/16   Roselle Conner, MD  allopurinol (ZYLOPRIM) 100 MG tablet Take 100 mg by mouth daily. 01/11/21   [provider]  amLODipine  (NORVASC ) 5 MG tablet Take 1 tablet by mouth daily 04/25/18   Flo Hummingbird, PA-C  carvedilol  (COREG ) 12.5 MG tablet Take 1 tablet (12.5 mg total) by mouth 2 (two) times daily. Patient taking differently: Take 25 mg by mouth 2 (two) times daily. 06/02/23 08/31/23  Gerald Kitty., NP  Cholecalciferol (VITAMIN D3) 50 MCG (2000 UT) capsule Take 2,000 Units by mouth daily. 01/11/21   [provider]  dorzolamide -timolol  (COSOPT ) 22.3-6.8 MG/ML ophthalmic solution Place 1 drop into both eyes 2 (two) times daily. 08/05/18   [provider]  ENTRESTO  97-103 MG Take 1 tablet by mouth 2 (two) times daily. 10/04/18   [provider]  furosemide  (LASIX ) 40 MG tablet Take 1 tablet (40 mg total) by mouth daily. You may take an extra tablet in the PM AS NEEDED for swelling up to twice a week 09/25/19   Lucendia Rusk, MD  latanoprost  (XALATAN ) 0.005 % ophthalmic solution Place 1 drop into both eyes at bedtime.  03/04/16   [provider]  meloxicam  (MOBIC ) 15 MG tablet Take 1 tablet (15 mg total) by mouth daily. 11/19/20   Sikora, Rebecca, DPM  meloxicam  (MOBIC ) 7.5 MG tablet Take 1 tablet (7.5 mg total) by mouth daily. 04/22/22   Standiford, Karlene Overcast, DPM  methylPREDNISolone  (MEDROL ) 4 MG tablet Take by mouth. 04/23/22   [provider]  mirtazapine (REMERON) 15 MG tablet Take 15 mg by mouth at bedtime. 04/20/22   [provider]  mirtazapine  (REMERON) 7.5 MG tablet Take 7.5 mg by mouth at bedtime. 04/08/22   [provider]  potassium chloride  (KLOR-CON ) 10 MEQ tablet Take 1 tablet (10 mEq total) by mouth daily. 11/21/20 11/21/21  Marlyse Single T, PA-C  SYMBICORT 160-4.5 MCG/ACT inhaler Inhale 1 puff into the lungs daily as needed (shortness of breath). 12/08/17   [provider]  urea  (CARMOL) 10 % cream Apply topically as needed. 05/06/22   Standiford, Karlene Overcast, DPM      Allergies    Losartan     Review of Systems   Review of Systems  Constitutional:  Negative for chills and fever.  HENT:  Negative for ear pain and sore throat.   Eyes:  Negative for pain and visual disturbance.  Respiratory:  Negative for cough and shortness of breath.   Cardiovascular:  Negative for chest pain and palpitations.  Gastrointestinal:  Positive for abdominal pain, diarrhea, nausea and vomiting.  Genitourinary:  Negative for dysuria and hematuria.  Musculoskeletal:  Negative for arthralgias and back pain.  Skin:  Negative for color change and rash.  Neurological:  Negative for seizures and syncope.  All other systems reviewed and are negative.   Physical Exam Updated Vital Signs BP (!) 184/109   Pulse 76   Temp 98.3 F (36.8 C) (Oral)   Resp 17   SpO2 94%  Physical Exam Vitals and nursing note reviewed.  Constitutional:      General: He is not in acute distress.    Appearance: Normal appearance. He is well-developed. He is not ill-appearing.  HENT:     Head: Normocephalic and atraumatic.  Eyes:     Extraocular Movements: Extraocular movements intact.     Conjunctiva/sclera: Conjunctivae normal.     Pupils: Pupils are equal, round, and reactive to light.  Cardiovascular:     Rate and Rhythm: Normal rate and regular rhythm.     Heart sounds: No murmur heard. Pulmonary:     Effort: Pulmonary effort is normal. No respiratory distress.     Breath sounds: Normal breath sounds.  Abdominal:     General: There is  no distension.     Palpations: Abdomen is soft.     Tenderness: There is no abdominal tenderness. There is no guarding.     Comments: Abdomen soft nontender no localized tenderness.  Musculoskeletal:        General: No swelling.     Cervical back: Normal range of motion and neck supple.  Skin:    General: Skin is warm and dry.     Capillary Refill: Capillary refill takes less than 2 seconds.  Neurological:     General: No focal deficit present.     Mental Status: He is alert and oriented to person, place, and time.  Psychiatric:        Mood and Affect: Mood normal.     ED Results / Procedures / Treatments   Labs (all labs ordered are listed, but only abnormal results are displayed) Labs Reviewed  LIPASE, BLOOD - Abnormal; Notable for the following components:      Result Value   Lipase 354 (*)    All other components within normal limits  COMPREHENSIVE METABOLIC PANEL WITH GFR - Abnormal; Notable for the following components:   Glucose, Bld 120 (*)    Total Bilirubin 1.4 (*)    All other components within normal limits  CBC - Abnormal; Notable for the following components:   WBC 11.1 (*)    MCH 34.4 (*)    All other components within normal limits  URINALYSIS, ROUTINE W REFLEX MICROSCOPIC    EKG None  Radiology CT ABDOMEN PELVIS W CONTRAST Result Date: 06/10/2023 CLINICAL DATA:  Abdominal pain.  Nausea vomiting and diarrhea. EXAM: CT ABDOMEN AND PELVIS WITH CONTRAST TECHNIQUE: Multidetector CT imaging of the abdomen and pelvis was performed using the standard protocol following bolus administration of intravenous contrast. RADIATION DOSE REDUCTION: This exam was performed according to the departmental dose-optimization program which includes automated exposure control, adjustment of the mA and/or kV according to patient size and/or use of iterative reconstruction technique. CONTRAST:  OMNIPAQUE  IOHEXOL  300 MG/ML  SOLN COMPARISON:  10/09/2020 FINDINGS: Lower chest:  Dependent atelectasis. Hepatobiliary: A tiny hypodensity in the liver parenchyma is too small to characterize but is statistically most likely benign. No followup imaging is recommended. Heterogeneous wall thickening is identified at the gallbladder fundus, similar to prior and evaluated by MRI on 10/10/2020, characterized as most likely representing adenomyomatosis dating back to 2015. Several tiny calcified gallstones evident. No pericholecystic fluid. No intrahepatic or extrahepatic biliary dilation. Pancreas: Dystrophic parenchymal calcification is seen in the head of the pancreas with mild prominence of the main pancreatic duct. Edema/inflammation is seen around the pancreatic head and to a lesser degree along the body and tail the pancreas. These changes track into the pancreatico duodenal groove and around the descending and proximal transverse duodenum. Spleen: No splenomegaly. No suspicious focal mass lesion. Adrenals/Urinary Tract: No adrenal nodule or mass. Tiny well-defined homogeneous low-density lesions in both kidneys are too small to characterize but are statistically most likely benign and probably cysts. No followup imaging is recommended. Prominent left extrarenal pelvis. No evidence for hydroureter. The urinary bladder appears normal for the degree of distention. Stomach/Bowel: Stomach is unremarkable. No gastric wall thickening. No evidence of outlet obstruction. As above, Peri duodenal edema/inflammation evident. No small bowel wall thickening. No small bowel dilatation. The terminal ileum is normal. Nonvisualization of the appendix is consistent with the reported history of appendectomy. No gross colonic mass. No colonic wall thickening. Vascular/Lymphatic: There is moderate atherosclerotic calcification of the abdominal aorta without aneurysm. There is no gastrohepatic or hepatoduodenal ligament lymphadenopathy. No retroperitoneal or mesenteric lymphadenopathy. No pelvic sidewall  lymphadenopathy. Reproductive: Status post prostatectomy. Other: No intraperitoneal free fluid. Musculoskeletal: No worrisome lytic or sclerotic osseous abnormality. IMPRESSION: 1. Edema/inflammation around the pancreatic head and to a lesser degree along the body and tail the pancreas. These changes track into the pancreatico duodenal groove and around the descending and proximal transverse duodenum. Imaging features are suggestive of acute on chronic pancreatitis with edema/inflammation tracking around the duodenum. 2. Cholelithiasis. 3. Heterogeneous wall thickening at the gallbladder fundus, similar to prior and evaluated by MRI on 10/10/2020 when it was characterized as most likely representing adenomyomatosis dating back to 2015. 4.  Aortic Atherosclerosis (ICD10-I70.0). Electronically Signed   By: Donnal Fusi M.D.   On: 06/10/2023 09:40    Procedures Procedures    Medications Ordered in ED Medications  ondansetron  (ZOFRAN ) injection 4 mg (4 mg Intravenous Given 06/10/23 0819)  HYDROmorphone  (DILAUDID ) injection 1 mg (1 mg Intravenous Given 06/10/23 0819)  iohexol  (OMNIPAQUE ) 300 MG/ML solution 100 mL (100 mLs Intravenous Contrast Given 06/10/23 0856)    ED Course/ Medical Decision Making/ A&P                                 Medical Decision Making Amount and/or Complexity of Data Reviewed Labs: ordered. Radiology: ordered.  Risk Prescription drug management. Decision regarding hospitalization.   Patient's abdominal pain predominantly epigastric.  Will treat with pain medicine and nausea medicine.  Have ordered's CBC which is back white count 11.1 hemoglobin 15.2 platelets 193.  Will get lipase and complete metabolic panel.  Patient in no respiratory distress.  Do not see need for chest x-ray at this time.  EKG was sinus rhythm.  Not in atrial fibs currently.  Minimal ST segment depression and consider anterior infarct.  Will compare to old.  Lipase elevated at 354.  Complete  metabolic panel normal including renal function with GFR greater than 60 but total bili is elevated at 1.4 AST ALT and alk phos are normal.  Albumin is normal 2.  Anion gap 10.  CT significant for edema inflammation around the pancreatic head and to a lesser degree along the body and tail of the pancreas.  Suggestive of acute on chronic pancreatitis.  Also has cholelithiasis.  There is some wall thickening of the gallbladder similar to prior MRI September 2022.  Based on this we will do bowel rest and contact hospitalist for admission.  Patient states he had a colonoscopy many years ago but it was in [facility].  Again patient did admit to drinking alcohol last time 2 days ago.  The symptoms started yesterday.  Final Clinical Impression(s) / ED Diagnoses Final diagnoses:  Epigastric pain  Acute pancreatitis without infection or necrosis, unspecified pancreatitis type    Rx / DC Orders ED Discharge Orders     None         Nicklas Barns, MD 06/10/23 1610    Nicklas Barns, MD 06/10/23 [phone]

## 2023-06-10 NOTE — ED Notes (Signed)
Pt made aware of needing UA sample. Urinal at bedside

## 2023-06-11 DIAGNOSIS — K852 Alcohol induced acute pancreatitis without necrosis or infection: Secondary | ICD-10-CM | POA: Diagnosis not present

## 2023-06-11 LAB — CBC
HCT: 43.7 % (ref 39.0–52.0)
Hemoglobin: 14.9 g/dL (ref 13.0–17.0)
MCH: 34.5 pg — ABNORMAL HIGH (ref 26.0–34.0)
MCHC: 34.1 g/dL (ref 30.0–36.0)
MCV: 101.2 fL — ABNORMAL HIGH (ref 80.0–100.0)
Platelets: 164 10*3/uL (ref 150–400)
RBC: 4.32 MIL/uL (ref 4.22–5.81)
RDW: 12.1 % (ref 11.5–15.5)
WBC: 13.9 10*3/uL — ABNORMAL HIGH (ref 4.0–10.5)
nRBC: 0 % (ref 0.0–0.2)

## 2023-06-11 LAB — COMPREHENSIVE METABOLIC PANEL WITH GFR
ALT: 13 U/L (ref 0–44)
AST: 26 U/L (ref 15–41)
Albumin: 4.3 g/dL (ref 3.5–5.0)
Alkaline Phosphatase: 58 U/L (ref 38–126)
Anion gap: 14 (ref 5–15)
BUN: 18 mg/dL (ref 8–23)
CO2: 19 mmol/L — ABNORMAL LOW (ref 22–32)
Calcium: 9.4 mg/dL (ref 8.9–10.3)
Chloride: 105 mmol/L (ref 98–111)
Creatinine, Ser: 0.88 mg/dL (ref 0.61–1.24)
GFR, Estimated: >60 mL/min (ref >60)
Glucose, Bld: 124 mg/dL — ABNORMAL HIGH (ref 70–99)
Potassium: 4 mmol/L (ref 3.5–5.1)
Sodium: 138 mmol/L (ref 135–145)
Total Bilirubin: 1 mg/dL (ref 0.0–1.2)
Total Protein: 7.8 g/dL (ref 6.5–8.1)

## 2023-06-11 LAB — LIPASE, BLOOD: Lipase: 699 U/L — ABNORMAL HIGH (ref 11–51)

## 2023-06-11 MED ORDER — SACUBITRIL-VALSARTAN 97-103 MG PO TABS
1.0000 | ORAL_TABLET | Freq: Two times a day (BID) | ORAL | Status: DC
  Administered 2023-06-11 – 2023-06-14 (×7): 1 via ORAL
  Filled 2023-06-11 (×7): qty 1

## 2023-06-11 MED ORDER — MELATONIN 5 MG PO TABS
5.0000 mg | ORAL_TABLET | Freq: Every evening | ORAL | Status: DC | PRN
  Administered 2023-06-11 (×2): 5 mg via ORAL
  Filled 2023-06-11 (×3): qty 1

## 2023-06-11 MED ORDER — SODIUM CHLORIDE 0.9 % IV BOLUS
500.0000 mL | Freq: Once | INTRAVENOUS | Status: AC
  Administered 2023-06-11: 500 mL via INTRAVENOUS

## 2023-06-11 NOTE — Progress Notes (Signed)
   06/11/23 0853  TOC Brief Assessment  Insurance and Status Reviewed  Patient has primary care physician Yes  Home environment has been reviewed home  Prior level of function: independent  Prior/Current Home Services No current home services  Social Drivers of Health Review SDOH reviewed no interventions necessary  Readmission risk has been reviewed Yes  Transition of care needs no transition of care needs at this time

## 2023-06-11 NOTE — Progress Notes (Signed)
  Progress Note   Patient: Leonard [name] ZHY:[id number] DOB: [birth year] DOA: 06/10/2023     1 DOS: the patient was seen and examined on 06/11/2023 at 10:45AM and 2:20PM      Brief hospital course: 73 y.o. M with HTN, sCHF EF now recovered, prosCA, asthma, pAF declined AC, and history PE who presented with epigastric pain, CT showed acute on chronic pancreatitis.     Assessment and Plan: Alcohol related acute on chronic pancreatitis CT shows no evidence of biliary obstructions, LFTs improved.  Ranson's score low. - N.p.o. - IV fluids - Anti-emetics and analgesics   Alcohol abuse Drinks 1/2 gal vodka weekly, wishes to cut back.  No prior history of complicated withdrawals. - Thiamine  and folate - CIWA scoring with on demand lorazepam  - Stop phenobarbital   Chronic systolic congestive heart failure Nonischemic cardiomyopathy Hypertensive urgency BP >180.  Last EF on echo in 2022 showed normal EF.  No evidence of fluid overload.  - Continue Coreg  and Entresto   - Continue amlodipine  - Hold furosemide   Bradycardia Has Zio patch based on recent reports of fatigue, and PCP ambulatory monitoring that discovered HR 28 while sleeping and some episodes of 2nd deg AVB.  No bradycardia here - Monitor on tele  Coronary artery disease Hyperlipidemia Noncompliant with statin  Paroxysmal atrial fibrillation In sinus rhythm, normal rate.  Has refused anticoagulation multiple times in the past - Continue carvedilol   COPD No wheezing - Okay to hold Breo      Subjective: Patient has epigastric pain.  No confusion.  No shakes.     Physical Exam: BP (!) 155/63 (BP Location: Right Arm)   Pulse 80   Temp 98.9 F (37.2 C) (Oral)   Resp 20   SpO2 95%   Elderly adult male, lying in bed, interactive but sedated from Dilaudid  and Ativan  RRR, no murmurs, no peripheral edema Respiratory rate normal, lungs clear without rales or wheezes Abdomen soft, tender in the  epigastrium Oriented to self, hospital, [place] but not Maryan Smalling, not oriented to year, but a lot of psychomotor slowing due to Ativan  and Dilaudid , and responses are appropriate and goal-oriented, generalized weakness    Data Reviewed: CT report reviewed Comprehensive metabolic panel shows normal LFTs, normalized total bilirubin, worsening lipase CBC unremarkable  Family Communication: None present    Disposition: Status is: Inpatient         Author: Ephriam Hashimoto, MD 06/11/2023 2:40 PM  For on call review [email or web address].

## 2023-06-11 NOTE — Hospital Course (Signed)
 73 y.o. M with HTN, sCHF EF now recovered, prosCA, asthma, pAF declined AC, and history PE who presented with epigastric pain, CT showed acute on chronic pancreatitis.

## 2023-06-11 NOTE — Plan of Care (Signed)
  Problem: Education: Goal: Knowledge of General Education information will improve Description: Including pain rating scale, medication(s)/side effects and non-pharmacologic comfort measures Outcome: Progressing   Problem: Health Behavior/Discharge Planning: Goal: Ability to manage health-related needs will improve Outcome: Progressing   Problem: Clinical Measurements: Goal: Ability to maintain clinical measurements within normal limits will improve Outcome: Progressing Goal: Diagnostic test results will improve Outcome: Progressing Goal: Cardiovascular complication will be avoided Outcome: Not Progressing   Problem: Activity: Goal: Risk for activity intolerance will decrease Outcome: Progressing   Problem: Coping: Goal: Level of anxiety will decrease Outcome: Not Progressing

## 2023-06-11 NOTE — Progress Notes (Signed)
 Mobility Specialist - Progress Note   06/11/23 1210  Mobility  Activity Transferred from bed to chair  Level of Assistance Standby assist, set-up cues, supervision of patient - no hands on  Assistive Device Front wheel walker  Activity Response Tolerated well  Mobility Referral Yes  Mobility visit 1 Mobility  Mobility Specialist Start Time (ACUTE ONLY) 1205  Mobility Specialist Stop Time (ACUTE ONLY) 1210  Mobility Specialist Time Calculation (min) (ACUTE ONLY) 5 min   Pt received in bed declining mobility but agreeable to transfer to the recliner. Pt c/o feeling dizzy. RN made aware. No other complaints during session. Pt to recliner after session with all needs met.    [facility]

## 2023-06-12 DIAGNOSIS — K852 Alcohol induced acute pancreatitis without necrosis or infection: Secondary | ICD-10-CM | POA: Diagnosis not present

## 2023-06-12 LAB — COMPREHENSIVE METABOLIC PANEL WITH GFR
ALT: 13 U/L (ref 0–44)
AST: 24 U/L (ref 15–41)
Albumin: 3.8 g/dL (ref 3.5–5.0)
Alkaline Phosphatase: 53 U/L (ref 38–126)
Anion gap: 9 (ref 5–15)
BUN: 16 mg/dL (ref 8–23)
CO2: 22 mmol/L (ref 22–32)
Calcium: 9 mg/dL (ref 8.9–10.3)
Chloride: 108 mmol/L (ref 98–111)
Creatinine, Ser: 0.73 mg/dL (ref 0.61–1.24)
GFR, Estimated: >60 mL/min (ref >60)
Glucose, Bld: 83 mg/dL (ref 70–99)
Potassium: 3.4 mmol/L — ABNORMAL LOW (ref 3.5–5.1)
Sodium: 139 mmol/L (ref 135–145)
Total Bilirubin: 1.1 mg/dL (ref 0.0–1.2)
Total Protein: 7.1 g/dL (ref 6.5–8.1)

## 2023-06-12 LAB — CBC
HCT: 41.8 % (ref 39.0–52.0)
Hemoglobin: 14.8 g/dL (ref 13.0–17.0)
MCH: 34.3 pg — ABNORMAL HIGH (ref 26.0–34.0)
MCHC: 35.4 g/dL (ref 30.0–36.0)
MCV: 97 fL (ref 80.0–100.0)
Platelets: 157 10*3/uL (ref 150–400)
RBC: 4.31 MIL/uL (ref 4.22–5.81)
RDW: 12 % (ref 11.5–15.5)
WBC: 16.1 10*3/uL — ABNORMAL HIGH (ref 4.0–10.5)
nRBC: 0 % (ref 0.0–0.2)

## 2023-06-12 LAB — LIPASE, BLOOD: Lipase: 212 U/L — ABNORMAL HIGH (ref 11–51)

## 2023-06-12 MED ORDER — POTASSIUM CHLORIDE CRYS ER 20 MEQ PO TBCR
40.0000 meq | EXTENDED_RELEASE_TABLET | Freq: Once | ORAL | Status: AC
  Administered 2023-06-12: 40 meq via ORAL
  Filled 2023-06-12: qty 2

## 2023-06-12 NOTE — Plan of Care (Signed)

## 2023-06-12 NOTE — Plan of Care (Signed)

## 2023-06-12 NOTE — Progress Notes (Signed)
  Progress Note   Patient: Leonard [name] ZOX:[id number] DOB: September 30, 1950 DOA: 06/10/2023     2 DOS: the patient was seen and examined on 06/12/2023 at 11:28AM      Brief hospital course: 73 y.o. M with HTN, sCHF EF now recovered, prosCA, asthma, pAF declined AC, and history PE who presented with epigastric pain, CT showed acute on chronic pancreatitis.     Assessment and Plan: Alcohol related acute on chronic pancreatitis CT showed no evidence of biliary obstruction.  LFTs stable.  Ranson score low.  Improving, advance to clears today. -CLD - Hold IV fluids - Continue antiemetics and analgesics   Alcohol abuse No obvious withdrawal symptoms.  No Ativan  given today. - Continue thiamine  and folate - Stop benzodiazepines  Chronic systolic congestive heart failure Nonischemic cardiomyopathy Hypertensive urgency Appears euvolemic -Hold Lasix  1 more day - Continue carvedilol , Entresto   Bradycardia Reportedly as an outpatient, rate normal here -Continue carvedilol   Coronary artery disease Hyperlipidemia No chest pain or angina - Continue amlodipine , carvedilol , Entresto  - Not on statin  Paroxysmal atrial fibrillation Rate controlled, in sinus rhythm, not on blood thinner - Continue carvedilol   COPD No wheezing here - Resume ICS/LAMA at discharge       Subjective: Doing well, hungry for clears, after eating clears, he had some abdominal discomfort and fullness.     Physical Exam: BP 134/77 (BP Location: Right Arm)   Pulse 82   Temp 97.9 F (36.6 C) (Oral)   Resp 18   SpO2 95%   Adult male, sitting up in bed, interactive and appropriate, tired RRR, no murmurs, no peripheral edema Respiratory rate normal, lungs clear without rales or wheezes Abdomen soft mild epigastric tenderness without guarding, no ascites or distention Attention normal, affect normal, judgment insight appear normal generalized weakness but symmetric strength, face symmetric, speech  fluent, oriented to person, place, and time, no tremor  Data Reviewed: White blood cell count up to 16 Lipase down to 212 LFTs normal Potassium 3.4, mild hypokalemia  Family Communication:     Disposition: Status is: Inpatient         Author: Ephriam Hashimoto, MD 06/12/2023 5:29 PM  For on call review [email or web address].

## 2023-06-13 DIAGNOSIS — K852 Alcohol induced acute pancreatitis without necrosis or infection: Secondary | ICD-10-CM | POA: Diagnosis not present

## 2023-06-13 LAB — CBC
HCT: 39.6 % (ref 39.0–52.0)
Hemoglobin: 13.4 g/dL (ref 13.0–17.0)
MCH: 34.4 pg — ABNORMAL HIGH (ref 26.0–34.0)
MCHC: 33.8 g/dL (ref 30.0–36.0)
MCV: 101.5 fL — ABNORMAL HIGH (ref 80.0–100.0)
Platelets: 154 10*3/uL (ref 150–400)
RBC: 3.9 MIL/uL — ABNORMAL LOW (ref 4.22–5.81)
RDW: 12.2 % (ref 11.5–15.5)
WBC: 11.9 10*3/uL — ABNORMAL HIGH (ref 4.0–10.5)
nRBC: 0 % (ref 0.0–0.2)

## 2023-06-13 LAB — COMPREHENSIVE METABOLIC PANEL WITH GFR
ALT: 14 U/L (ref 0–44)
AST: 22 U/L (ref 15–41)
Albumin: 3.5 g/dL (ref 3.5–5.0)
Alkaline Phosphatase: 51 U/L (ref 38–126)
Anion gap: 10 (ref 5–15)
BUN: 17 mg/dL (ref 8–23)
CO2: 19 mmol/L — ABNORMAL LOW (ref 22–32)
Calcium: 8.8 mg/dL — ABNORMAL LOW (ref 8.9–10.3)
Chloride: 107 mmol/L (ref 98–111)
Creatinine, Ser: 0.9 mg/dL (ref 0.61–1.24)
GFR, Estimated: >60 mL/min (ref >60)
Glucose, Bld: 143 mg/dL — ABNORMAL HIGH (ref 70–99)
Potassium: 3.6 mmol/L (ref 3.5–5.1)
Sodium: 136 mmol/L (ref 135–145)
Total Bilirubin: 1.1 mg/dL (ref 0.0–1.2)
Total Protein: 6.8 g/dL (ref 6.5–8.1)

## 2023-06-13 LAB — LIPASE, BLOOD: Lipase: 136 U/L — ABNORMAL HIGH (ref 11–51)

## 2023-06-13 MED ORDER — MELATONIN 5 MG PO TABS
5.0000 mg | ORAL_TABLET | Freq: Once | ORAL | Status: AC
  Administered 2023-06-13: 5 mg via ORAL
  Filled 2023-06-13: qty 1

## 2023-06-13 MED ORDER — POTASSIUM CL IN DEXTROSE 5% 20 MEQ/L IV SOLN
20.0000 meq | INTRAVENOUS | Status: AC
  Administered 2023-06-13 (×2): 20 meq via INTRAVENOUS
  Filled 2023-06-13 (×4): qty 1000

## 2023-06-13 NOTE — TOC Initial Note (Signed)
 Transition of Care [facility]) - Initial/Assessment Note    Patient Details  Name: Leonard [name] MRN: [id number] Date of Birth: April 29, 1950  [facility]) CM/SW Contact:    Tessie Fila, RN Phone Number: 06/13/2023, 3:03 PM  Clinical Narrative:                 Met with patient to discuss his substance use. Pt states that he only uses alcohol and wants help to cease drinking. When patient asked about stressors that may cause his drinking he states, "I just like to drink." Pt believes that if he does not go into a residential facility to help with his substance use then he will continue to buy alcohol and drink. He states he knows it is bad for him but he is having trouble quitting on his own. He does have a sister that lives and [place] and he states that she is supportive in his life. Referrals were faxed out to substance abuse residential facilities. Made pt aware that facilities may need to interview him and he is agreeable. TOC will continue to follow for any new recommendations or needs.  Expected Discharge Plan: Home/Self Care Barriers to Discharge: Continued Medical Work up   Patient Goals and CMS Choice Patient states their goals for this hospitalization and ongoing recovery are:: Would like to go to a residential rehab for substance abuse CMS Medicare.gov Compare Post Acute Care list provided to:: Other (Comment Required) (NA) Choice offered to / list presented to : NA [place] ownership interest in [facility].provided to:: Parent NA    Expected Discharge Plan and Services In-house Referral: NA Discharge Planning Services: NA Post Acute Care Choice: NA Living arrangements for the past 2 months: Single Family Home                 DME Arranged: N/A DME Agency: NA       HH Arranged: NA HH Agency: NA        Prior Living Arrangements/Services Living arrangements for the past 2 months: Single Family Home Lives with:: Self Patient language and need  for interpreter reviewed:: Yes Do you feel safe going back to the place where you live?: Yes      Need for Family Participation in Patient Care: Yes (Comment) Care giver support system in place?: No (comment)   Criminal Activity/Legal Involvement Pertinent to Current Situation/Hospitalization: No - Comment as needed  Activities of Daily Living      Permission Sought/Granted Permission sought to share information with : Family Supports Permission granted to share information with : Yes, Release of Information Signed  Share Information with NAME: Leonard [name] (Sister)  [phone]           Emotional Assessment Appearance:: Appears stated age Attitude/Demeanor/Rapport: Engaged Affect (typically observed): Accepting, Appropriate Orientation: : Oriented to Self, Oriented to Place, Oriented to  Time, Oriented to Situation Alcohol / Substance Use: Alcohol Use Psych Involvement: No (comment)  Admission diagnosis:  Epigastric pain [R10.13] Alcohol induced acute pancreatitis [K85.20] Acute pancreatitis without infection or necrosis, unspecified pancreatitis type [K85.90] Patient Active Problem List   Diagnosis Date Noted   Alcohol induced acute pancreatitis 06/10/2023   Hyperbilirubinemia 06/10/2023   CAD (coronary artery disease) 04/06/2023   Paroxysmal atrial fibrillation (HCC) 04/06/2023   Aortic insufficiency 04/06/2023   Ascending aorta dilation (HCC) 04/06/2023   Nicotine  dependence 01/15/2022   Choledocholithiasis with obstruction 10/09/2020   Gallbladder mass 10/09/2020   Chronic HFrEF (heart failure with reduced ejection fraction) (  HCC) 10/09/2020   AKI (acute kidney injury) (HCC) 10/27/2018   Hypotension 10/27/2018   Alcohol abuse 12/28/2017   Glaucoma 12/28/2017   Nonischemic cardiomyopathy (HCC) 10/17/2017   History of pulmonary embolus (PE) 10/17/2017   Pulmonary embolism (HCC) 02/28/2017   Acute exacerbation of congestive heart failure (HCC) 02/24/2017   CHF  exacerbation (HCC) 02/24/2017   Hypertensive urgency 02/24/2017   Pulmonary edema 02/24/2017   Acute kidney injury superimposed on chronic kidney disease (HCC) 02/24/2017   Hypertensive heart disease 09/27/2016   Tobacco abuse 09/27/2016   Mixed hyperlipidemia 09/27/2016   History of tobacco abuse 06/25/2016   Allergic sinusitis 04/17/2016   Asthma 01/19/2016   CKD (chronic kidney disease) stage 2, GFR 60-89 ml/min 01/19/2016   Aspiration pneumonitis (HCC)    Essential hypertension    Heart failure with reduced ejection fraction (HCC) 01/10/2016   Malignant neoplasm of prostate (HCC) 07/03/2013   PCP:  Jonathon Neighbors, MD Pharmacy:   [facility] [phone] - Jonette Nestle, [place] - [address] AT [facility] [address] [place] [address] Phone: [phone] Fax: [phone]  [facility] - [place], [place] - [address] [place] [address] Phone: [phone] Fax: [phone]     Social Drivers of Health (SDOH) Social History: SDOH Screenings   Food Insecurity: No Food Insecurity (06/11/2023)  Housing: Low Risk  (06/11/2023)  Transportation Needs: No Transportation Needs (06/11/2023)  Utilities: Not At Risk (06/11/2023)  Alcohol Screen: Low Risk  (11/06/2019)  Depression (PHQ2-9): Low Risk  (09/26/2020)  Financial Resource Strain: Low Risk  (11/06/2019)  Physical Activity: Inactive (11/06/2019)  Social Connections: Unknown (11/06/2019)  Stress: No Stress Concern Present (11/06/2019)  Tobacco Use: High Risk (06/02/2023)   SDOH Interventions:     Readmission Risk Interventions    06/13/2023    2:58 PM 06/11/2023    8:53 AM  Readmission Risk Prevention Plan  Post Dischage Appt Complete Complete  Medication Screening Complete Complete  Transportation Screening Complete Complete

## 2023-06-13 NOTE — Plan of Care (Signed)

## 2023-06-13 NOTE — Progress Notes (Signed)
  Progress Note   Patient: Leonard [name] ZOX:[id number] DOB: 12-Jul-1950 DOA: 06/10/2023     3 DOS: the patient was seen and examined on 06/13/2023 at 10:10AM      Brief hospital course: 73 y.o. M with HTN, sCHF EF now recovered, prosCA, asthma, pAF declined AC, and history PE who presented with epigastric pain, CT showed acute on chronic pancreatitis.     Assessment and Plan: Acute on chronic pancreatitis due to alcohol CT abdomen on admission showed no evidence of biliary obstruction, LFTs normal, Ranson score low.  Advance to clears yesterday, developed some abdominal pain after broth. - Resume IV fluids - Continue clear liquid diet - Continue antiemetics and analgesics  Alcohol use Drinks 1/2 gal vodka weekly, wishes to cut back. No prior history of complicated withdrawals.  No obvious withdrawal symptoms - Continue thiamine  and folate  Chronic systolic congestive heart failure Nonischemic cardiomyopathy Hypertensive urgency Last EF on echo in 2022 showed normal EF. No evidence of fluid overload. Blood pressure improved.  Appears euvolemic - Hold Lasix  while on IV fluids - Continue carvedilol , Entresto   Bradycardia Has Zio patch based on recent reports of fatigue, and PCP ambulatory monitoring that discovered HR 28 while sleeping and some episodes of 2nd deg AVB. No bradycardia noted on telemetry here.  -Continue carvedilol  - No further need for telemetry  Coronary artery disease Hyperlipidemia Showed no chest pain or angina.  Not on statin - Continue amlodipine , carvedilol , Entresto   Paroxysmal atrial fibrillation In sinus rhythm, normal rate. Has refused anticoagulation multiple times in the past  - Continue carvedilol   COPD No wheezing - Can resume ICS/LAMA at discharge       Subjective: Had some epigastric pain this morning after drinking broth for breakfast.  No fever, no vomiting.  No confusion.     Physical Exam: BP (!) 146/82 (BP Location: Right  Arm)   Pulse 75   Temp 97.9 F (36.6 C) (Oral)   Resp 18   SpO2 96%   Adult male, sitting up in bed, interactive and appropriate RRR, no murmurs, no peripheral edema Respiratory rate normal, lungs clear without rales or wheezes Abdomen soft, tender in the epigastrium, no rigidity or rebound, mild guarding Oriented to situation, ANO x 4, ambulating with a walker, speech fluent, face symmetric    Data Reviewed: Lipase improving Comprehensive metabolic panel normal CBC shows macrocytosis only  Family Communication:     Disposition: Status is: Inpatient         Author: Ephriam Hashimoto, MD 06/13/2023 11:50 AM  For on call review [email or web address].

## 2023-06-13 NOTE — Plan of Care (Signed)
  Problem: Education: Goal: Knowledge of General Education information will improve Description Including pain rating scale, medication(s)/side effects and non-pharmacologic comfort measures Outcome: Progressing   Problem: Health Behavior/Discharge Planning: Goal: Ability to manage health-related needs will improve Outcome: Progressing   Problem: Clinical Measurements: Goal: Ability to maintain clinical measurements within normal limits will improve Outcome: Progressing Goal: Will remain free from infection Outcome: Progressing Goal: Diagnostic test results will improve Outcome: Progressing   Problem: Nutrition: Goal: Adequate nutrition will be maintained Outcome: Progressing   Problem: Coping: Goal: Level of anxiety will decrease Outcome: Progressing   

## 2023-06-13 NOTE — Plan of Care (Signed)
   Problem: Nutrition: Goal: Adequate nutrition will be maintained Outcome: Progressing   Problem: Pain Managment: Goal: General experience of comfort will improve and/or be controlled Outcome: Progressing   Problem: Safety: Goal: Ability to remain free from injury will improve Outcome: Progressing

## 2023-06-14 DIAGNOSIS — K852 Alcohol induced acute pancreatitis without necrosis or infection: Secondary | ICD-10-CM | POA: Diagnosis not present

## 2023-06-14 LAB — CBC
HCT: 37.9 % — ABNORMAL LOW (ref 39.0–52.0)
Hemoglobin: 13 g/dL (ref 13.0–17.0)
MCH: 34.4 pg — ABNORMAL HIGH (ref 26.0–34.0)
MCHC: 34.3 g/dL (ref 30.0–36.0)
MCV: 100.3 fL — ABNORMAL HIGH (ref 80.0–100.0)
Platelets: 157 10*3/uL (ref 150–400)
RBC: 3.78 MIL/uL — ABNORMAL LOW (ref 4.22–5.81)
RDW: 11.9 % (ref 11.5–15.5)
WBC: 8.9 10*3/uL (ref 4.0–10.5)
nRBC: 0 % (ref 0.0–0.2)

## 2023-06-14 LAB — COMPREHENSIVE METABOLIC PANEL WITH GFR
ALT: 16 U/L (ref 0–44)
AST: 23 U/L (ref 15–41)
Albumin: 3.5 g/dL (ref 3.5–5.0)
Alkaline Phosphatase: 48 U/L (ref 38–126)
Anion gap: 7 (ref 5–15)
BUN: 13 mg/dL (ref 8–23)
CO2: 23 mmol/L (ref 22–32)
Calcium: 8.9 mg/dL (ref 8.9–10.3)
Chloride: 102 mmol/L (ref 98–111)
Creatinine, Ser: 0.78 mg/dL (ref 0.61–1.24)
GFR, Estimated: >60 mL/min (ref >60)
Glucose, Bld: 130 mg/dL — ABNORMAL HIGH (ref 70–99)
Potassium: 3.7 mmol/L (ref 3.5–5.1)
Sodium: 132 mmol/L — ABNORMAL LOW (ref 135–145)
Total Bilirubin: 0.9 mg/dL (ref 0.0–1.2)
Total Protein: 6.8 g/dL (ref 6.5–8.1)

## 2023-06-14 NOTE — TOC Transition Note (Signed)
 Transition of Care [facility]) - Discharge Note   Patient Details  Name: Leonard [name] MRN: [id number] Date of Birth: 11-02-1950  Transition of Care [facility]) CM/SW Contact:  Tessie Fila, RN Phone Number: 06/14/2023, 2:31 PM   Clinical Narrative:    Pt discharging home. Met with pt to give him information for [facility] at [phone]. Informed pt that he would need to call this number to have a phone interview with [place] at the facility. Pt voiced understanding and states that he will call once he is settled at home. Pt has no other TOC needs at this time.   Final next level of care: Home/Self Care Barriers to Discharge: No Barriers Identified   Patient Goals and CMS Choice Patient states their goals for this hospitalization and ongoing recovery are:: To get treatment with [facility] treatment center CMS Medicare.gov Compare Post Acute Care list provided to:: Other (Comment Required) Choice offered to / list presented to : NA [place] ownership interest in [facility].provided to:: Parent NA    Discharge Placement                       Discharge Plan and Services Additional resources added to the After Visit Summary for   In-house Referral: NA Discharge Planning Services: NA Post Acute Care Choice: NA          DME Arranged: N/A DME Agency: NA       HH Arranged: NA HH Agency: NA        Social Drivers of Health (SDOH) Interventions SDOH Screenings   Food Insecurity: No Food Insecurity (06/11/2023)  Housing: Low Risk  (06/11/2023)  Transportation Needs: No Transportation Needs (06/11/2023)  Utilities: Not At Risk (06/11/2023)  Alcohol Screen: Low Risk  (11/06/2019)  Depression (PHQ2-9): Low Risk  (09/26/2020)  Financial Resource Strain: Low Risk  (11/06/2019)  Physical Activity: Inactive (11/06/2019)  Social Connections: Unknown (06/14/2023)  Stress: No Stress Concern Present (11/06/2019)  Tobacco Use: High Risk (06/02/2023)     Readmission  Risk Interventions    06/13/2023    2:58 PM 06/11/2023    8:53 AM  Readmission Risk Prevention Plan  Post Dischage Appt Complete Complete  Medication Screening Complete Complete  Transportation Screening Complete Complete

## 2023-06-14 NOTE — Discharge Summary (Signed)
 Physician Discharge Summary   Patient: Leonard [name] MRN: [id number] DOB: February 13, 1950  Admit date:     06/10/2023  Discharge date: 06/14/23  Discharge Physician: Ephriam Hashimoto   PCP: Jonathon Neighbors, MD     Recommendations at discharge:  Follow up with Dr. Nida Barrow for acute pancreatitis     Discharge Diagnoses: Principal Problem:   Alcohol induced acute pancreatitis Active Problems:   Essential hypertension   Tobacco abuse   Mixed hyperlipidemia   Alcohol abuse   Glaucoma   CAD (coronary artery disease)   Paroxysmal atrial fibrillation (HCC)   Hyperbilirubinemia      Hospital Course: 73 y.o. M with HTN, sCHF EF now recovered, prosCA, asthma, pAF declined AC, and history PE who presented with epigastric pain, CT showed acute on chronic pancreatitis.    Acute on chronic pancreatitis due to alcohol CT abdomen on admission showed no evidence of biliary obstruction, LFTs normal.  Admitted on fluids, analgesics.  Pain improved, advanced diet slowly and tolerated well.  On day of discharge, the patient ate a solid diet, had no abdominal pain, and was ambulating and mentating well.    Alcohol use Patient reported drinking 1/2 gal vodka weekly, wishes to cut back. No prior history of complicated withdrawals.  No evidence of withdrawal here.  Alcohol cessation recommended.   Chronic systolic congestive heart failure Nonischemic cardiomyopathy Hypertensive urgency Last EF on echo in 2022 showed normal EF. No evidence of fluid overload. Blood pressure improved.  Appears euvolemic  Stable on Lasix , carvedilol , Entresto    Bradycardia Has Zio patch based on recent reports of fatigue, and PCP ambulatory monitoring that discovered HR 28 while sleeping and some episodes of 2nd deg AVB. No bradycardia noted on telemetry here.    Coronary artery disease Hyperlipidemia Showed no chest pain or angina.  Not on statin   Paroxysmal atrial fibrillation In sinus rhythm, normal  rate. Has refused anticoagulation multiple times in the past    COPD No evidence of acute flare                  The [place]  Controlled Substances Registry was reviewed for this patient prior to discharge.  Consultants: None Procedures performed: CT abdomen Disposition: Home Diet recommendation:  Discharge Diet Orders (From admission, onward)     Start     Ordered   06/14/23 0000  Diet - low sodium heart healthy        06/14/23 1414              DISCHARGE MEDICATION: Allergies as of 06/14/2023       Reactions   Losartan  Rash, Other (See Comments)   Abdominal and leg rashes noted in 12/17        Medication List     TAKE these medications    albuterol  (2.5 MG/3ML) 0.083% nebulizer solution Commonly known as: PROVENTIL  Take 3 mLs (2.5 mg total) by nebulization every 6 (six) hours as needed for wheezing or shortness of breath. What changed: Another medication with the same name was changed. Make sure you understand how and when to take each.   albuterol  108 (90 Base) MCG/ACT inhaler Commonly known as: VENTOLIN  HFA Inhale 2 puffs into the lungs every 4 (four) hours as needed for wheezing or shortness of breath. What changed:  how much to take when to take this   allopurinol 100 MG tablet Commonly known as: ZYLOPRIM Take 100 mg by mouth daily.   amLODipine  5 MG tablet Commonly known as:  NORVASC  Take 1 tablet by mouth daily What changed:  how much to take how to take this when to take this   Breo Ellipta 100-25 MCG/ACT Aepb Generic drug: fluticasone furoate-vilanterol Inhale 1 puff into the lungs daily.   carvedilol  25 MG tablet Commonly known as: COREG  Take 25 mg by mouth 2 (two) times daily with a meal.   Entresto  97-103 MG Generic drug: sacubitril -valsartan  Take 1 tablet by mouth 2 (two) times daily.   furosemide  40 MG tablet Commonly known as: LASIX  Take 1 tablet (40 mg total) by mouth daily. You may take an extra tablet in the  PM AS NEEDED for swelling up to twice a week What changed: additional instructions   gabapentin 100 MG capsule Commonly known as: NEURONTIN Take 100 mg by mouth at bedtime.   multivitamin with minerals tablet Take 1 tablet by mouth daily.   Vitamin D (Ergocalciferol) 1.25 MG (50000 UNIT) Caps capsule Commonly known as: DRISDOL Take 50,000 Units by mouth once a week.        Follow-up Information     Jonathon Neighbors, MD. Schedule an appointment as soon as possible for a visit in 1 week(s).   Specialty: Family Medicine Contact information: [address] [place] [address] [phone]                 Discharge Instructions     Diet - low sodium heart healthy   Complete by: As directed    Discharge instructions   Complete by: As directed    **IMPORTANT DISCHARGE INSTRUCTIONS**   From Dr. Darlyn Eke: You were admitted for acute pancreatitis (which is what was causing your severe abdominal pain and nausea)  We looked for causes of pancreatitis and in your case it looks like it was due to alcohol.    Acute pancreatitis has no direct treatment, other than just allowing the pancreas to rest (by not eating) and lots of intravenous fluids After allowing pancreas rest, your pancreatitis improved  Alcohol is a direct toxin to the pancreas, so DO NOT drink alcohol  Likewise, fatty foods, fried foods, and heavy meats are hard on the pancreas, so DO NOT eat heavy foods, fried foods, fatty foods, or lots of meat or fat any time soon, or this will be very likely to cause the pancreatitis to flare back up.  Avoid alcohol and stick to a light diet (fruit, applesauce, rice, mashed potatoes, cereal, etc) for now  Go see Dr. Nida Barrow your primary care in 1 week  Resume your normal home medicines   Increase activity slowly   Complete by: As directed        Discharge Exam: There were no vitals filed for this visit.  General: Pt is alert, awake, not in acute  distress Cardiovascular: RRR, nl S1-S2, no murmurs appreciated.   No LE edema.   Respiratory: Normal respiratory rate and rhythm.  CTAB without rales or wheezes. Abdominal: Abdomen soft and non-tender.  No distension or HSM.   Neuro/Psych: Strength symmetric in upper and lower extremities.  Judgment and insight appear normal.   Condition at discharge: good  The results of significant diagnostics from this hospitalization (including imaging, microbiology, ancillary and laboratory) are listed below for reference.   Imaging Studies: CT ABDOMEN PELVIS W CONTRAST Result Date: 06/10/2023 CLINICAL DATA:  Abdominal pain.  Nausea vomiting and diarrhea. EXAM: CT ABDOMEN AND PELVIS WITH CONTRAST TECHNIQUE: Multidetector CT imaging of the abdomen and pelvis was performed using the standard protocol following  bolus administration of intravenous contrast. RADIATION DOSE REDUCTION: This exam was performed according to the departmental dose-optimization program which includes automated exposure control, adjustment of the mA and/or kV according to patient size and/or use of iterative reconstruction technique. CONTRAST:  OMNIPAQUE  IOHEXOL  300 MG/ML  SOLN COMPARISON:  10/09/2020 FINDINGS: Lower chest: Dependent atelectasis. Hepatobiliary: A tiny hypodensity in the liver parenchyma is too small to characterize but is statistically most likely benign. No followup imaging is recommended. Heterogeneous wall thickening is identified at the gallbladder fundus, similar to prior and evaluated by MRI on 10/10/2020, characterized as most likely representing adenomyomatosis dating back to 2015. Several tiny calcified gallstones evident. No pericholecystic fluid. No intrahepatic or extrahepatic biliary dilation. Pancreas: Dystrophic parenchymal calcification is seen in the head of the pancreas with mild prominence of the main pancreatic duct. Edema/inflammation is seen around the pancreatic head and to a lesser degree along the  body and tail the pancreas. These changes track into the pancreatico duodenal groove and around the descending and proximal transverse duodenum. Spleen: No splenomegaly. No suspicious focal mass lesion. Adrenals/Urinary Tract: No adrenal nodule or mass. Tiny well-defined homogeneous low-density lesions in both kidneys are too small to characterize but are statistically most likely benign and probably cysts. No followup imaging is recommended. Prominent left extrarenal pelvis. No evidence for hydroureter. The urinary bladder appears normal for the degree of distention. Stomach/Bowel: Stomach is unremarkable. No gastric wall thickening. No evidence of outlet obstruction. As above, Peri duodenal edema/inflammation evident. No small bowel wall thickening. No small bowel dilatation. The terminal ileum is normal. Nonvisualization of the appendix is consistent with the reported history of appendectomy. No gross colonic mass. No colonic wall thickening. Vascular/Lymphatic: There is moderate atherosclerotic calcification of the abdominal aorta without aneurysm. There is no gastrohepatic or hepatoduodenal ligament lymphadenopathy. No retroperitoneal or mesenteric lymphadenopathy. No pelvic sidewall lymphadenopathy. Reproductive: Status post prostatectomy. Other: No intraperitoneal free fluid. Musculoskeletal: No worrisome lytic or sclerotic osseous abnormality. IMPRESSION: 1. Edema/inflammation around the pancreatic head and to a lesser degree along the body and tail the pancreas. These changes track into the pancreatico duodenal groove and around the descending and proximal transverse duodenum. Imaging features are suggestive of acute on chronic pancreatitis with edema/inflammation tracking around the duodenum. 2. Cholelithiasis. 3. Heterogeneous wall thickening at the gallbladder fundus, similar to prior and evaluated by MRI on 10/10/2020 when it was characterized as most likely representing adenomyomatosis dating back to  2015. 4.  Aortic Atherosclerosis (ICD10-I70.0). Electronically Signed   By: Donnal Fusi M.D.   On: 06/10/2023 09:40    Microbiology: Results for orders placed or performed during the hospital encounter of 12/02/22  Resp panel by RT-PCR (RSV, Flu A&B, Covid) Anterior Nasal Swab     Status: None   Collection Time: 12/02/22 11:39 AM   Specimen: Anterior Nasal Swab  Result Value Ref Range Status   SARS Coronavirus 2 by RT PCR NEGATIVE NEGATIVE Final   Influenza A by PCR NEGATIVE NEGATIVE Final   Influenza B by PCR NEGATIVE NEGATIVE Final    Comment: (NOTE) The Xpert Xpress SARS-CoV-2/FLU/RSV plus assay is intended as an aid in the diagnosis of influenza from Nasopharyngeal swab specimens and should not be used as a sole basis for treatment. Nasal washings and aspirates are unacceptable for Xpert Xpress SARS-CoV-2/FLU/RSV testing.  Fact Sheet for Patients: BloggerCourse.com  Fact Sheet for Healthcare Providers: SeriousBroker.it  This test is not yet approved or cleared by the [place]  FDA and has been authorized for  detection and/or diagnosis of SARS-CoV-2 by FDA under an Emergency Use Authorization (EUA). This EUA will remain in effect (meaning this test can be used) for the duration of the COVID-19 declaration under Section 564(b)(1) of the Act, 21 U.S.C. section 360bbb-3(b)(1), unless the authorization is terminated or revoked.     Resp Syncytial Virus by PCR NEGATIVE NEGATIVE Final    Comment: (NOTE) Fact Sheet for Patients: BloggerCourse.com  Fact Sheet for Healthcare Providers: SeriousBroker.it  This test is not yet approved or cleared by the [place]  FDA and has been authorized for detection and/or diagnosis of SARS-CoV-2 by FDA under an Emergency Use Authorization (EUA). This EUA will remain in effect (meaning this test can be used) for the duration of  the COVID-19 declaration under Section 564(b)(1) of the Act, 21 U.S.C. section 360bbb-3(b)(1), unless the authorization is terminated or revoked.  Performed at [facility], [address], [place], [place] [address]     Labs: CBC: Recent Labs  Lab 06/10/23 0805 06/11/23 0859 06/12/23 0634 06/13/23 0457 06/14/23 0605  WBC 11.1* 13.9* 16.1* 11.9* 8.9  HGB 15.2 14.9 14.8 13.4 13.0  HCT 42.8 43.7 41.8 39.6 37.9*  MCV 96.8 101.2* 97.0 101.5* 100.3*  PLT 194 164 157 154 157   Basic Metabolic Panel: Recent Labs  Lab 06/10/23 0805 06/11/23 0859 06/12/23 0634 06/13/23 0457 06/14/23 0605  NA 138 138 139 136 132*  K 4.0 4.0 3.4* 3.6 3.7  CL 102 105 108 107 102  CO2 26 19* 22 19* 23  GLUCOSE 120* 124* 83 143* 130*  BUN 20 18 16 17 13   CREATININE 1.01 0.88 0.73 0.90 0.78  CALCIUM  9.6 9.4 9.0 8.8* 8.9  MG 2.2  --   --   --   --   PHOS 3.0  --   --   --   --    Liver Function Tests: Recent Labs  Lab 06/10/23 0805 06/11/23 0859 06/12/23 0634 06/13/23 0457 06/14/23 0605  AST 27 26 24 22 23   ALT 16 13 13 14 16   ALKPHOS 61 58 53 51 48  BILITOT 1.4* 1.0 1.1 1.1 0.9  PROT 8.0 7.8 7.1 6.8 6.8  ALBUMIN 4.4 4.3 3.8 3.5 3.5   CBG: No results for input(s): "GLUCAP" in the last 168 hours.  Discharge time spent: approximately 25 minutes spent on discharge counseling, evaluation of patient on day of discharge, and coordination of discharge planning with nursing, social work, pharmacy and case management  Signed: Ephriam Hashimoto, MD [facility] 06/14/2023

## 2023-06-14 NOTE — Progress Notes (Signed)
 Mobility Specialist - Progress Note   06/14/23 1123  Mobility  Activity Ambulated with assistance in hallway  Level of Assistance Modified independent, requires aide device or extra time  Assistive Device Front wheel walker  Distance Ambulated (ft) 500 ft  Activity Response Tolerated well  Mobility Referral Yes  Mobility visit 1 Mobility  Mobility Specialist Start Time (ACUTE ONLY) 1110  Mobility Specialist Stop Time (ACUTE ONLY) 1123  Mobility Specialist Time Calculation (min) (ACUTE ONLY) 13 min   Pt received EOB and agreeable to mobility. No complaints during session. Pt to recliner after session with all needs met.    [facility]

## 2023-06-30 ENCOUNTER — Ambulatory Visit (HOSPITAL_COMMUNITY): Admission: RE | Admit: 2023-06-30 | Source: Ambulatory Visit

## 2023-06-30 NOTE — Telephone Encounter (Signed)
 Echocardiogram scheduled for 07/07/23, f/u appt 08/2023 with Charles Connor, NP.

## 2023-07-07 ENCOUNTER — Ambulatory Visit (HOSPITAL_COMMUNITY)
Admission: RE | Admit: 2023-07-07 | Source: Ambulatory Visit | Attending: Nurse Practitioner | Admitting: Nurse Practitioner

## 2023-07-11 ENCOUNTER — Encounter (HOSPITAL_COMMUNITY): Payer: Self-pay | Admitting: Nurse Practitioner

## 2023-08-17 ENCOUNTER — Encounter: Payer: Self-pay | Admitting: Nurse Practitioner

## 2023-08-23 NOTE — Progress Notes (Deleted)
 Cardiology Office Note    Patient Name: Leonard [name] Date of Encounter: 08/23/2023  Primary Care Provider:  Benjamine Aland, MD Primary Cardiologist:  None Primary Electrophysiologist: None   Past Medical History    Past Medical History:  Diagnosis Date   Alcohol abuse    Aortic insufficiency    Asthma    CAD (coronary artery disease)    a. cath 2018 with nonbstructive CAD (40% prox RCA, 25% distal RCA, 25% ramus, 20% mid LAD.   CKD (chronic kidney disease) stage 2, GFR 60-89 ml/min    COPD (chronic obstructive pulmonary disease) (HCC)    Gout    HFrEF (heart failure with reduced ejection fraction) (HCC)    Hypertension    Mitral regurgitation    NICM (nonischemic cardiomyopathy) (HCC)    PAF (paroxysmal atrial fibrillation) (HCC)    Prostate cancer (HCC)    Renal artery stenosis (HCC)    a. mild left renal artery stenosis by cath 2018.   Tobacco abuse     History of Present Illness  Leonard [name] is a 73 y.o. male with a PMH of***    Patient denies chest pain, palpitations, dyspnea, PND, orthopnea, nausea, vomiting, dizziness, syncope, edema, weight gain, or early satiety.   Discussed the use of AI scribe software for clinical note transcription with the patient, who gave verbal consent to proceed.  History of Present Illness    ***Notes:   Review of Systems  Please see the history of present illness.    All other systems reviewed and are otherwise negative except as noted above.  Physical Exam    Wt Readings from Last 3 Encounters:  06/02/23 163 lb 12.8 oz (74.3 kg)  12/02/22 156 lb 8.4 oz (71 kg)  11/19/20 157 lb 9.6 oz (71.5 kg)   CD:Uyzmz were no vitals filed for this visit.,There is no height or weight on file to calculate BMI. GEN: Well nourished, well developed in no acute distress Neck: No JVD; No carotid bruits Pulmonary: Clear to auscultation without rales, wheezing or rhonchi  Cardiovascular: Normal rate. Regular rhythm. Normal S1. Normal  S2.   Murmurs: There is no murmur.  ABDOMEN: Soft, non-tender, non-distended EXTREMITIES:  No edema; No deformity   EKG/LABS/ Recent Cardiac Studies   ECG personally reviewed by me today - ***  Risk Assessment/Calculations:   {Does this patient have ATRIAL FIBRILLATION?:[id number]}      Lab Results  Component Value Date   WBC 8.9 06/14/2023   HGB 13.0 06/14/2023   HCT 37.9 (L) 06/14/2023   MCV 100.3 (H) 06/14/2023   PLT 157 06/14/2023   Lab Results  Component Value Date   CREATININE 0.78 06/14/2023   BUN 13 06/14/2023   NA 132 (L) 06/14/2023   K 3.7 06/14/2023   CL 102 06/14/2023   CO2 23 06/14/2023   Lab Results  Component Value Date   CHOL 183 10/18/2017   HDL 84 10/18/2017   LDLCALC 83 10/18/2017   TRIG 79 10/18/2017   CHOLHDL 2.2 10/18/2017    Lab Results  Component Value Date   HGBA1C 4.8 01/11/2016   Assessment & Plan    Assessment and Plan Assessment & Plan     1.***  2.***  3.***  4.***      Disposition: Follow-up with None or APP in *** months {Are you ordering a CV Procedure (e.g. stress test, cath, DCCV, TEE, etc)?   Press F2        :[id number]}   Signed, Wyn Raddle,  Jackee Shove, NP 08/23/2023, 12:36 PM [facility]

## 2023-08-25 ENCOUNTER — Ambulatory Visit: Admitting: Nurse Practitioner

## 2023-08-27 ENCOUNTER — Other Ambulatory Visit: Payer: Self-pay

## 2023-08-27 ENCOUNTER — Encounter (HOSPITAL_COMMUNITY): Payer: Self-pay

## 2023-08-27 ENCOUNTER — Inpatient Hospital Stay (HOSPITAL_COMMUNITY)
Admission: EM | Admit: 2023-08-27 | Discharge: 2023-08-30 | DRG: 439 | Disposition: A | Discharge status: Home / Self Care | Attending: Internal Medicine | Admitting: Internal Medicine

## 2023-08-27 DIAGNOSIS — F419 Anxiety disorder, unspecified: Secondary | ICD-10-CM | POA: Diagnosis present

## 2023-08-27 DIAGNOSIS — Z79899 Other long term (current) drug therapy: Secondary | ICD-10-CM | POA: Diagnosis not present

## 2023-08-27 DIAGNOSIS — K861 Other chronic pancreatitis: Secondary | ICD-10-CM | POA: Diagnosis present

## 2023-08-27 DIAGNOSIS — Z888 Allergy status to other drugs, medicaments and biological substances status: Secondary | ICD-10-CM

## 2023-08-27 DIAGNOSIS — F17210 Nicotine dependence, cigarettes, uncomplicated: Secondary | ICD-10-CM | POA: Diagnosis present

## 2023-08-27 DIAGNOSIS — I2510 Atherosclerotic heart disease of native coronary artery without angina pectoris: Secondary | ICD-10-CM | POA: Diagnosis present

## 2023-08-27 DIAGNOSIS — I428 Other cardiomyopathies: Secondary | ICD-10-CM | POA: Diagnosis present

## 2023-08-27 DIAGNOSIS — K8051 Calculus of bile duct without cholangitis or cholecystitis with obstruction: Secondary | ICD-10-CM | POA: Diagnosis present

## 2023-08-27 DIAGNOSIS — F1010 Alcohol abuse, uncomplicated: Secondary | ICD-10-CM | POA: Diagnosis present

## 2023-08-27 DIAGNOSIS — I130 Hypertensive heart and chronic kidney disease with heart failure and stage 1 through stage 4 chronic kidney disease, or unspecified chronic kidney disease: Secondary | ICD-10-CM | POA: Diagnosis present

## 2023-08-27 DIAGNOSIS — K828 Other specified diseases of gallbladder: Secondary | ICD-10-CM | POA: Diagnosis present

## 2023-08-27 DIAGNOSIS — Z8249 Family history of ischemic heart disease and other diseases of the circulatory system: Secondary | ICD-10-CM | POA: Diagnosis not present

## 2023-08-27 DIAGNOSIS — Z720 Tobacco use: Secondary | ICD-10-CM | POA: Diagnosis present

## 2023-08-27 DIAGNOSIS — I480 Paroxysmal atrial fibrillation: Secondary | ICD-10-CM | POA: Diagnosis present

## 2023-08-27 DIAGNOSIS — I5042 Chronic combined systolic (congestive) and diastolic (congestive) heart failure: Secondary | ICD-10-CM | POA: Diagnosis present

## 2023-08-27 DIAGNOSIS — C61 Malignant neoplasm of prostate: Secondary | ICD-10-CM | POA: Diagnosis present

## 2023-08-27 DIAGNOSIS — M109 Gout, unspecified: Secondary | ICD-10-CM | POA: Diagnosis present

## 2023-08-27 DIAGNOSIS — K8020 Calculus of gallbladder without cholecystitis without obstruction: Secondary | ICD-10-CM | POA: Diagnosis present

## 2023-08-27 DIAGNOSIS — K859 Acute pancreatitis without necrosis or infection, unspecified: Secondary | ICD-10-CM | POA: Diagnosis present

## 2023-08-27 DIAGNOSIS — J449 Chronic obstructive pulmonary disease, unspecified: Secondary | ICD-10-CM | POA: Diagnosis present

## 2023-08-27 DIAGNOSIS — Z5982 Transportation insecurity: Secondary | ICD-10-CM

## 2023-08-27 DIAGNOSIS — Z8546 Personal history of malignant neoplasm of prostate: Secondary | ICD-10-CM | POA: Diagnosis not present

## 2023-08-27 DIAGNOSIS — K852 Alcohol induced acute pancreatitis without necrosis or infection: Principal | ICD-10-CM | POA: Diagnosis present

## 2023-08-27 DIAGNOSIS — N182 Chronic kidney disease, stage 2 (mild): Secondary | ICD-10-CM | POA: Diagnosis present

## 2023-08-27 DIAGNOSIS — I5020 Unspecified systolic (congestive) heart failure: Secondary | ICD-10-CM | POA: Diagnosis present

## 2023-08-27 LAB — GLUCOSE, CAPILLARY: Glucose-Capillary: 111 mg/dL — ABNORMAL HIGH (ref 70–99)

## 2023-08-27 LAB — CBC WITH DIFFERENTIAL/PLATELET
Abs Immature Granulocytes: 0.03 K/uL (ref 0.00–0.07)
Basophils Absolute: 0 K/uL (ref 0.0–0.1)
Basophils Relative: 0 %
Eosinophils Absolute: 0.2 K/uL (ref 0.0–0.5)
Eosinophils Relative: 2 %
HCT: 37.2 % — ABNORMAL LOW (ref 39.0–52.0)
Hemoglobin: 13.1 g/dL (ref 13.0–17.0)
Immature Granulocytes: 0 %
Lymphocytes Relative: 14 %
Lymphs Abs: 1.5 K/uL (ref 0.7–4.0)
MCH: 34.8 pg — ABNORMAL HIGH (ref 26.0–34.0)
MCHC: 35.2 g/dL (ref 30.0–36.0)
MCV: 98.9 fL (ref 80.0–100.0)
Monocytes Absolute: 1 K/uL (ref 0.1–1.0)
Monocytes Relative: 9 %
Neutro Abs: 8.2 K/uL — ABNORMAL HIGH (ref 1.7–7.7)
Neutrophils Relative %: 75 %
Platelets: 199 K/uL (ref 150–400)
RBC: 3.76 MIL/uL — ABNORMAL LOW (ref 4.22–5.81)
RDW: 12.8 % (ref 11.5–15.5)
WBC: 10.9 K/uL — ABNORMAL HIGH (ref 4.0–10.5)
nRBC: 0 % (ref 0.0–0.2)

## 2023-08-27 LAB — COMPREHENSIVE METABOLIC PANEL WITH GFR
ALT: 11 U/L (ref 0–44)
AST: 21 U/L (ref 15–41)
Albumin: 4.1 g/dL (ref 3.5–5.0)
Alkaline Phosphatase: 60 U/L (ref 38–126)
Anion gap: 12 (ref 5–15)
BUN: 24 mg/dL — ABNORMAL HIGH (ref 8–23)
CO2: 24 mmol/L (ref 22–32)
Calcium: 9.7 mg/dL (ref 8.9–10.3)
Chloride: 103 mmol/L (ref 98–111)
Creatinine, Ser: 1.06 mg/dL (ref 0.61–1.24)
GFR, Estimated: >60 mL/min (ref >60)
Glucose, Bld: 121 mg/dL — ABNORMAL HIGH (ref 70–99)
Potassium: 3.7 mmol/L (ref 3.5–5.1)
Sodium: 139 mmol/L (ref 135–145)
Total Bilirubin: 1 mg/dL (ref 0.0–1.2)
Total Protein: 7.5 g/dL (ref 6.5–8.1)

## 2023-08-27 LAB — LIPASE, BLOOD: Lipase: 384 U/L — ABNORMAL HIGH (ref 11–51)

## 2023-08-27 MED ORDER — ONDANSETRON HCL 4 MG PO TABS: 4.0000 mg | ORAL_TABLET | Freq: Four times a day (QID) | ORAL | Status: DC | PRN

## 2023-08-27 MED ORDER — LORAZEPAM 1 MG PO TABS
1.0000 mg | ORAL_TABLET | ORAL | Status: DC | PRN
  Administered 2023-08-27 – 2023-08-29 (×5): 1 mg via ORAL
  Filled 2023-08-27 (×5): qty 1

## 2023-08-27 MED ORDER — SODIUM CHLORIDE 0.9 % IV SOLN: INTRAVENOUS | Status: DC

## 2023-08-27 MED ORDER — ALLOPURINOL 100 MG PO TABS
100.0000 mg | ORAL_TABLET | Freq: Every day | ORAL | Status: DC
  Administered 2023-08-27 – 2023-08-30 (×4): 100 mg via ORAL
  Filled 2023-08-27 (×4): qty 1

## 2023-08-27 MED ORDER — SACUBITRIL-VALSARTAN 97-103 MG PO TABS
1.0000 | ORAL_TABLET | Freq: Two times a day (BID) | ORAL | Status: DC
  Administered 2023-08-27 – 2023-08-30 (×6): 1 via ORAL
  Filled 2023-08-27 (×8): qty 1

## 2023-08-27 MED ORDER — AMLODIPINE BESYLATE 5 MG PO TABS
5.0000 mg | ORAL_TABLET | Freq: Every day | ORAL | Status: DC
  Administered 2023-08-27 – 2023-08-29 (×3): 5 mg via ORAL
  Filled 2023-08-27 (×3): qty 1

## 2023-08-27 MED ORDER — ACETAMINOPHEN 325 MG PO TABS
650.0000 mg | ORAL_TABLET | Freq: Four times a day (QID) | ORAL | Status: DC | PRN
  Administered 2023-08-29 – 2023-08-30 (×2): 650 mg via ORAL
  Filled 2023-08-27 (×2): qty 2

## 2023-08-27 MED ORDER — CARVEDILOL 25 MG PO TABS
25.0000 mg | ORAL_TABLET | Freq: Two times a day (BID) | ORAL | Status: DC
  Administered 2023-08-27 – 2023-08-30 (×7): 25 mg via ORAL
  Filled 2023-08-27 (×5): qty 1
  Filled 2023-08-27: qty 2
  Filled 2023-08-27 (×2): qty 1

## 2023-08-27 MED ORDER — HYDROMORPHONE HCL 1 MG/ML IJ SOLN
0.5000 mg | INTRAMUSCULAR | Status: DC | PRN
  Administered 2023-08-27 – 2023-08-28 (×4): 1 mg via INTRAVENOUS
  Filled 2023-08-27 (×4): qty 1

## 2023-08-27 MED ORDER — THIAMINE MONONITRATE 100 MG PO TABS
100.0000 mg | ORAL_TABLET | Freq: Every day | ORAL | Status: DC
  Administered 2023-08-28 – 2023-08-30 (×3): 100 mg via ORAL
  Filled 2023-08-27 (×3): qty 1

## 2023-08-27 MED ORDER — LORAZEPAM 2 MG/ML IJ SOLN: 1.0000 mg | INTRAMUSCULAR | Status: DC | PRN

## 2023-08-27 MED ORDER — FOLIC ACID 1 MG PO TABS
1.0000 mg | ORAL_TABLET | Freq: Every day | ORAL | Status: DC
  Administered 2023-08-27 – 2023-08-30 (×4): 1 mg via ORAL
  Filled 2023-08-27 (×4): qty 1

## 2023-08-27 MED ORDER — FENTANYL CITRATE PF 50 MCG/ML IJ SOSY
50.0000 ug | PREFILLED_SYRINGE | INTRAMUSCULAR | Status: DC | PRN
  Administered 2023-08-27: 50 ug via INTRAVENOUS
  Filled 2023-08-27: qty 1

## 2023-08-27 MED ORDER — ACETAMINOPHEN 650 MG RE SUPP: 650.0000 mg | Freq: Four times a day (QID) | RECTAL | Status: DC | PRN

## 2023-08-27 MED ORDER — HYDRALAZINE HCL 20 MG/ML IJ SOLN: 10.0000 mg | Freq: Four times a day (QID) | INTRAMUSCULAR | Status: DC | PRN

## 2023-08-27 MED ORDER — OXYCODONE HCL 5 MG PO TABS
5.0000 mg | ORAL_TABLET | ORAL | Status: DC | PRN
  Administered 2023-08-28 (×2): 5 mg via ORAL
  Filled 2023-08-27 (×2): qty 1

## 2023-08-27 MED ORDER — FENTANYL CITRATE PF 50 MCG/ML IJ SOSY
50.0000 ug | PREFILLED_SYRINGE | Freq: Once | INTRAMUSCULAR | Status: AC
  Administered 2023-08-27: 50 ug via INTRAVENOUS
  Filled 2023-08-27: qty 1

## 2023-08-27 MED ORDER — ADULT MULTIVITAMIN W/MINERALS CH
1.0000 | ORAL_TABLET | Freq: Every day | ORAL | Status: DC
  Administered 2023-08-27 – 2023-08-30 (×4): 1 via ORAL
  Filled 2023-08-27 (×4): qty 1

## 2023-08-27 MED ORDER — ENOXAPARIN SODIUM 40 MG/0.4ML IJ SOSY
40.0000 mg | PREFILLED_SYRINGE | INTRAMUSCULAR | Status: DC
  Administered 2023-08-27 – 2023-08-29 (×3): 40 mg via SUBCUTANEOUS
  Filled 2023-08-27 (×4): qty 0.4

## 2023-08-27 MED ORDER — ALBUTEROL SULFATE (2.5 MG/3ML) 0.083% IN NEBU: 2.5000 mg | INHALATION_SOLUTION | RESPIRATORY_TRACT | Status: DC | PRN

## 2023-08-27 MED ORDER — THIAMINE HCL 100 MG/ML IJ SOLN
100.0000 mg | Freq: Every day | INTRAMUSCULAR | Status: DC
  Administered 2023-08-27: 100 mg via INTRAVENOUS
  Filled 2023-08-27 (×2): qty 2

## 2023-08-27 MED ORDER — DROPERIDOL 2.5 MG/ML IJ SOLN
1.2500 mg | Freq: Once | INTRAMUSCULAR | Status: AC
  Administered 2023-08-27: 1.25 mg via INTRAVENOUS
  Filled 2023-08-27: qty 2

## 2023-08-27 MED ORDER — ONDANSETRON HCL 4 MG/2ML IJ SOLN: 4.0000 mg | Freq: Four times a day (QID) | INTRAMUSCULAR | Status: DC | PRN

## 2023-08-27 NOTE — ED Provider Notes (Signed)
 Leonard [name] EMERGENCY DEPARTMENT AT [facility] Provider Note   CSN: [id number] Arrival date & time: 08/27/23  9650     Patient presents with: Abdominal Pain   Leonard [name] is a 73 y.o. male.   The history is provided by the patient.  Abdominal Pain Pain location:  Epigastric Pain radiates to:  Does not radiate Pain severity:  Severe Duration:  2 days (Since drinking gran [employer] and [employer]) Timing:  Constant Progression:  Unchanged Chronicity:  Recurrent Context: alcohol use   Relieved by:  Nothing Worsened by:  Nothing Ineffective treatments:  OTC medications Associated symptoms: nausea and vomiting   Associated symptoms: no fever   Risk factors: alcohol abuse        Prior to Admission medications   Medication Sig Start Date End Date Taking? Authorizing Provider  albuterol  (PROVENTIL  HFA;VENTOLIN  HFA) 108 (90 Base) MCG/ACT inhaler Inhale 2 puffs into the lungs every 4 (four) hours as needed for wheezing or shortness of breath. Patient taking differently: Inhale 1-2 puffs into the lungs every 6 (six) hours as needed for wheezing or shortness of breath. 08/10/17  Yes Horton, Charmaine FALCON, MD  albuterol  (PROVENTIL ) (2.5 MG/3ML) 0.083% nebulizer solution Take 3 mLs (2.5 mg total) by nebulization every 6 (six) hours as needed for wheezing or shortness of breath. 01/19/16  Yes Donnald Delon DASEN, MD  allopurinol  (ZYLOPRIM ) 100 MG tablet Take 100 mg by mouth daily. 01/11/21  Yes [provider]  amLODipine  (NORVASC ) 5 MG tablet Take 1 tablet by mouth daily Patient taking differently: Take 5 mg by mouth at bedtime. Take 1 tablet by mouth daily 04/25/18  Yes Parthenia Olivia HERO, PA-C  BREO ELLIPTA  100-25 MCG/ACT AEPB Inhale 1 puff into the lungs daily as needed. 05/11/23  Yes [provider]  carvedilol  (COREG ) 25 MG tablet Take 25 mg by mouth 2 (two) times daily with a meal.   Yes [provider]  ENTRESTO  97-103 MG Take 1 tablet by mouth 2 (two)  times daily. 10/04/18  Yes [provider]  furosemide  (LASIX ) 40 MG tablet Take 1 tablet (40 mg total) by mouth daily. You may take an extra tablet in the PM AS NEEDED for swelling up to twice a week Patient taking differently: Take 40 mg by mouth daily. 09/25/19  Yes Dann Candyce RAMAN, MD  gabapentin  (NEURONTIN ) 100 MG capsule Take 100 mg by mouth at bedtime.   Yes [provider]    Allergies: Losartan     Review of Systems  Constitutional:  Negative for fever.  Gastrointestinal:  Positive for abdominal pain, nausea and vomiting.  All other systems reviewed and are negative.   Updated Vital Signs BP (!) 185/115   Pulse 65   Temp 97.9 F (36.6 C) (Oral)   Resp 14   Ht 5' 7 (1.702 m)   Wt 72.6 kg   SpO2 94%   BMI 25.06 kg/m   Physical Exam Vitals and nursing note reviewed.  Constitutional:      General: He is not in acute distress.    Appearance: He is well-developed. He is not diaphoretic.  HENT:     Head: Normocephalic and atraumatic.     Nose: Nose normal.  Eyes:     Conjunctiva/sclera: Conjunctivae normal.     Pupils: Pupils are equal, round, and reactive to light.  Cardiovascular:     Rate and Rhythm: Normal rate and regular rhythm.     Pulses: Normal pulses.     Heart sounds:  Normal heart sounds.  Pulmonary:     Effort: Pulmonary effort is normal.     Breath sounds: Normal breath sounds. No wheezing or rales.  Abdominal:     General: Bowel sounds are normal.     Palpations: Abdomen is soft.     Tenderness: There is no abdominal tenderness. There is no guarding or rebound.  Musculoskeletal:        General: Normal range of motion.     Cervical back: Normal range of motion and neck supple.  Skin:    General: Skin is warm and dry.     Capillary Refill: Capillary refill takes less than 2 seconds.  Neurological:     General: No focal deficit present.     Mental Status: He is alert and oriented to person, place, and time.     Deep Tendon  Reflexes: Reflexes normal.  Psychiatric:        Mood and Affect: Mood normal.        Behavior: Behavior normal.     (all labs ordered are listed, but only abnormal results are displayed) Results for orders placed or performed during the hospital encounter of 08/27/23  CBC with Differential   Collection Time: 08/27/23  4:05 AM  Result Value Ref Range   WBC 10.9 (H) 4.0 - 10.5 K/uL   RBC 3.76 (L) 4.22 - 5.81 MIL/uL   Hemoglobin 13.1 13.0 - 17.0 g/dL   HCT 62.7 (L) 60.9 - 47.9 %   MCV 98.9 80.0 - 100.0 fL   MCH 34.8 (H) 26.0 - 34.0 pg   MCHC 35.2 30.0 - 36.0 g/dL   RDW 87.1 88.4 - 84.4 %   Platelets 199 150 - 400 K/uL   nRBC 0.0 0.0 - 0.2 %   Neutrophils Relative % 75 %   Neutro Abs 8.2 (H) 1.7 - 7.7 K/uL   Lymphocytes Relative 14 %   Lymphs Abs 1.5 0.7 - 4.0 K/uL   Monocytes Relative 9 %   Monocytes Absolute 1.0 0.1 - 1.0 K/uL   Eosinophils Relative 2 %   Eosinophils Absolute 0.2 0.0 - 0.5 K/uL   Basophils Relative 0 %   Basophils Absolute 0.0 0.0 - 0.1 K/uL   Immature Granulocytes 0 %   Abs Immature Granulocytes 0.03 0.00 - 0.07 K/uL  Comprehensive metabolic panel   Collection Time: 08/27/23  4:05 AM  Result Value Ref Range   Sodium 139 135 - 145 mmol/L   Potassium 3.7 3.5 - 5.1 mmol/L   Chloride 103 98 - 111 mmol/L   CO2 24 22 - 32 mmol/L   Glucose, Bld 121 (H) 70 - 99 mg/dL   BUN 24 (H) 8 - 23 mg/dL   Creatinine, Ser 8.93 0.61 - 1.24 mg/dL   Calcium  9.7 8.9 - 10.3 mg/dL   Total Protein 7.5 6.5 - 8.1 g/dL   Albumin 4.1 3.5 - 5.0 g/dL   AST 21 15 - 41 U/L   ALT 11 0 - 44 U/L   Alkaline Phosphatase 60 38 - 126 U/L   Total Bilirubin 1.0 0.0 - 1.2 mg/dL   GFR, Estimated >39 >39 mL/min   Anion gap 12 5 - 15  Lipase, blood   Collection Time: 08/27/23  4:05 AM  Result Value Ref Range   Lipase 384 (H) 11 - 51 U/L   No results found.  EKG: None  Radiology: No results found.   Procedures   Medications Ordered in the ED  fentaNYL  (SUBLIMAZE ) injection 50 mcg  (  has no administration in time range)  droperidol  (INAPSINE ) 2.5 MG/ML injection 1.25 mg (has no administration in time range)  0.9 %  sodium chloride  infusion (has no administration in time range)                                    Medical Decision Making Patient with a h/o alcoholic pancreatitis BIB EMS for same   Amount and/or Complexity of Data Reviewed Independent Historian: EMS    Details: See above  External Data Reviewed: notes.    Details: Previous notes reviewed  Labs: ordered.    Details: Elevated lipase 384.  Slight elevation white count 10.9, normal hemoglobin 13.1, normal platelets. Normal sodium 139, normal potassium 3.7, normal creatinine.    Risk Prescription drug management. Decision regarding hospitalization.     Final diagnoses:  None   The patient appears reasonably stabilized for admission considering the current resources, flow, and capabilities available in the ED at this time, and I doubt any other [facility] requiring further screening and/or treatment in the ED prior to admission.  ED Discharge Orders     None          Ilaria Much, MD 08/27/23 9396

## 2023-08-27 NOTE — Plan of Care (Incomplete)
  Problem: Education: Goal: Knowledge of General Education information will improve Description: Including pain rating scale, medication(s)/side effects and non-pharmacologic comfort measures Outcome: Progressing   Problem: Health Behavior/Discharge Planning: Goal: Ability to manage health-related needs will improve Outcome: Progressing   Problem: Clinical Measurements: Goal: Ability to maintain clinical measurements within normal limits will improve Outcome: Progressing Goal: Will remain free from infection Outcome: Progressing Goal: Diagnostic test results will improve Outcome: Progressing   Problem: Activity: Goal: Risk for activity intolerance will decrease Outcome: Progressing   Problem: Coping: Goal: Level of anxiety will decrease Outcome: Progressing   Problem: Pain Managment: Goal: General experience of comfort will improve and/or be controlled Outcome: Progressing   Problem: Safety: Goal: Ability to remain free from injury will improve Outcome: Progressing   Problem: Nutrition: Goal: Adequate nutrition will be maintained Outcome: Not Progressing

## 2023-08-27 NOTE — ED Notes (Signed)
 Pt hypertensive. Admitting doctor made aware.

## 2023-08-27 NOTE — ED Notes (Signed)
 ED TO INPATIENT HANDOFF REPORT  ED Nurse Name and Phone #: Randolm CROME EMTP  S Name/Age/Gender Leonard [name] 73 y.o. male Room/Bed: WA03/WA03  Code Status   Code Status: Full Code  Home/SNF/Other Home Patient oriented to: self, place, time, and situation Is this baseline? Yes   Triage Complete: Triage complete  Chief Complaint Acute pancreatitis [K85.90]  Triage Note BIBA from home reports upper abdominal pain for 2 days at a 7/10 . Reports of N/V.  Hx of pancreatitis 182/100 P 68 CBG 116    Allergies Allergies  Allergen Reactions   Losartan  Rash and Other (See Comments)    Abdominal and leg rashes noted in 12/17    Level of Care/Admitting Diagnosis ED Disposition     ED Disposition  Admit   Condition  --   Comment  Hospital Area: [facility] [[id number]]  Level of Care: Progressive [102]  Admit to Progressive based on following criteria: GI, ENDOCRINE disease patients with GI bleeding, acute liver failure or pancreatitis, stable with diabetic ketoacidosis or thyrotoxicosis (hypothyroid) state.  May admit patient to [facility] or [facility] if equivalent level of care is available:: No  Covid Evaluation: Asymptomatic - no recent exposure (last 10 days) testing not required  Diagnosis: Acute pancreatitis [577.0.ICD-9-CM]  Admitting Physician: DEBBY CAMILA LABOR [[id number]]  Attending Physician: DEBBY CAMILA LABOR [[id number]]  Certification:: I certify this patient will need inpatient services for at least 2 midnights  Expected Medical Readiness: 08/30/2023          B Medical/Surgery History Past Medical History:  Diagnosis Date   Alcohol abuse    Aortic insufficiency    Asthma    CAD (coronary artery disease)    a. cath 2018 with nonbstructive CAD (40% prox RCA, 25% distal RCA, 25% ramus, 20% mid LAD.   CKD (chronic kidney disease) stage 2, GFR 60-89 ml/min    COPD (chronic obstructive pulmonary disease) (HCC)    Gout    HFrEF (heart  failure with reduced ejection fraction) (HCC)    Hypertension    Mitral regurgitation    NICM (nonischemic cardiomyopathy) (HCC)    PAF (paroxysmal atrial fibrillation) (HCC)    Prostate cancer (HCC)    Renal artery stenosis (HCC)    a. mild left renal artery stenosis by cath 2018.   Tobacco abuse    Past Surgical History:  Procedure Laterality Date   APPENDECTOMY     HYDROCELE EXCISION Left 12/01/2015   Procedure: REPAIR OF LEFT HYDROCELE;  Surgeon: Gretel Ferrara, MD;  Location: [facility] ORS;  Service: Urology;  Laterality: Left;   LYMPHADENECTOMY Bilateral 09/24/2013   Procedure: LYMPHADENECTOMY;  Surgeon: Gretel Ferrara, MD;  Location: [facility];  Service: Urology;  Laterality: Bilateral;   PROSTATE BIOPSY     RIGHT/LEFT HEART CATH AND CORONARY ANGIOGRAPHY N/A 06/10/2016   Procedure: Right/Left Heart Cath and Coronary Angiography;  Surgeon: Candyce GORMAN Reek, MD;  Location: [facility] INVASIVE CV LAB;  Service: Cardiovascular;  Laterality: N/A;   ROBOT ASSISTED LAPAROSCOPIC RADICAL PROSTATECTOMY N/A 09/24/2013   Procedure: ROBOTIC ASSISTED LAPAROSCOPIC RADICAL PROSTATECTOMY LEVEL 2;  Surgeon: Gretel Ferrara, MD;  Location: [facility];  Service: Urology;  Laterality: N/A;     A IV Location/Drains/Wounds Patient Lines/Drains/Airways Status     Active Line/Drains/Airways     Name Placement date Placement time Site Days   Peripheral IV 08/27/23 20 G Anterior;Distal;Right;Upper Arm 08/27/23  0404  Arm  less than 1  Intake/Output Last 24 hours No intake or output data in the 24 hours ending 08/27/23 1337  Labs/Imaging Results for orders placed or performed during the hospital encounter of 08/27/23 (from the past 48 hours)  CBC with Differential     Status: Abnormal   Collection Time: 08/27/23  4:05 AM  Result Value Ref Range   WBC 10.9 (H) 4.0 - 10.5 K/uL   RBC 3.76 (L) 4.22 - 5.81 MIL/uL   Hemoglobin 13.1 13.0 - 17.0 g/dL   HCT 62.7 (L) 60.9 - 47.9 %   MCV 98.9 80.0 - 100.0 fL    MCH 34.8 (H) 26.0 - 34.0 pg   MCHC 35.2 30.0 - 36.0 g/dL   RDW 87.1 88.4 - 84.4 %   Platelets 199 150 - 400 K/uL   nRBC 0.0 0.0 - 0.2 %   Neutrophils Relative % 75 %   Neutro Abs 8.2 (H) 1.7 - 7.7 K/uL   Lymphocytes Relative 14 %   Lymphs Abs 1.5 0.7 - 4.0 K/uL   Monocytes Relative 9 %   Monocytes Absolute 1.0 0.1 - 1.0 K/uL   Eosinophils Relative 2 %   Eosinophils Absolute 0.2 0.0 - 0.5 K/uL   Basophils Relative 0 %   Basophils Absolute 0.0 0.0 - 0.1 K/uL   Immature Granulocytes 0 %   Abs Immature Granulocytes 0.03 0.00 - 0.07 K/uL    Comment: Performed at [facility], [address], [place], [place] [address]  Comprehensive metabolic panel     Status: Abnormal   Collection Time: 08/27/23  4:05 AM  Result Value Ref Range   Sodium 139 135 - 145 mmol/L   Potassium 3.7 3.5 - 5.1 mmol/L   Chloride 103 98 - 111 mmol/L   CO2 24 22 - 32 mmol/L   Glucose, Bld 121 (H) 70 - 99 mg/dL    Comment: Glucose reference range applies only to samples taken after fasting for at least 8 hours.   BUN 24 (H) 8 - 23 mg/dL   Creatinine, Ser 8.93 0.61 - 1.24 mg/dL   Calcium  9.7 8.9 - 10.3 mg/dL   Total Protein 7.5 6.5 - 8.1 g/dL   Albumin 4.1 3.5 - 5.0 g/dL   AST 21 15 - 41 U/L   ALT 11 0 - 44 U/L   Alkaline Phosphatase 60 38 - 126 U/L   Total Bilirubin 1.0 0.0 - 1.2 mg/dL   GFR, Estimated >39 >39 mL/min    Comment: (NOTE) Calculated using the CKD-EPI Creatinine Equation (2021)    Anion gap 12 5 - 15    Comment: Performed at [facility], [address], [place], [place] [address]  Lipase, blood     Status: Abnormal   Collection Time: 08/27/23  4:05 AM  Result Value Ref Range   Lipase 384 (H) 11 - 51 U/L    Comment: Performed at [facility], [address] [place], [place] [address]   No results found.  Pending Labs Unresulted Labs (From admission, onward)     Start     Ordered   08/28/23 0500  Basic metabolic panel  Tomorrow  morning,   R        08/27/23 0824   08/28/23 0500  CBC  Tomorrow morning,   R        08/27/23 0824            Vitals/Pain Today's Vitals   08/27/23 1154 08/27/23 1215 08/27/23 1230 08/27/23 1245  BP:  (!) 189/81 (!) 183/95 (!) 188/92  Pulse:  63 64 62  Resp:  15 15 15   Temp: 98.6 F (37 C)     TempSrc: Oral     SpO2:  96% 98% 97%  Weight:      Height:      PainSc:        Isolation Precautions No active isolations  Medications Medications  0.9 %  sodium chloride  infusion (0 mLs Intravenous Stopped 08/27/23 1337)  LORazepam  (ATIVAN ) tablet 1-4 mg (1 mg Oral Given 08/27/23 0734)    Or  LORazepam  (ATIVAN ) injection 1-4 mg ( Intravenous See Alternative 08/27/23 0734)  thiamine  (VITAMIN B1) tablet 100 mg ( Oral See Alternative 08/27/23 0946)    Or  thiamine  (VITAMIN B1) injection 100 mg (100 mg Intravenous Given 08/27/23 0946)  folic acid  (FOLVITE ) tablet 1 mg (1 mg Oral Given by Other 08/27/23 0947)  multivitamin with minerals tablet 1 tablet (1 tablet Oral Given by Other 08/27/23 0946)  allopurinol  (ZYLOPRIM ) tablet 100 mg (100 mg Oral Given by Other 08/27/23 0947)  carvedilol  (COREG ) tablet 25 mg (25 mg Oral Given 08/27/23 0907)  amLODipine  (NORVASC ) tablet 5 mg (has no administration in time range)  sacubitril -valsartan  (ENTRESTO ) 97-103 mg per tablet (1 tablet Oral Given by Other 08/27/23 0946)  enoxaparin  (LOVENOX ) injection 40 mg (has no administration in time range)  acetaminophen  (TYLENOL ) tablet 650 mg (has no administration in time range)    Or  acetaminophen  (TYLENOL ) suppository 650 mg (has no administration in time range)  oxyCODONE  (Oxy IR/ROXICODONE ) immediate release tablet 5 mg (has no administration in time range)  HYDROmorphone  (DILAUDID ) injection 0.5-1 mg (1 mg Intravenous Given 08/27/23 1002)  ondansetron  (ZOFRAN ) tablet 4 mg (has no administration in time range)    Or  ondansetron  (ZOFRAN ) injection 4 mg (has no administration in time range)  albuterol   (PROVENTIL ) (2.5 MG/3ML) 0.083% nebulizer solution 2.5 mg (has no administration in time range)  hydrALAZINE  (APRESOLINE ) injection 10 mg (has no administration in time range)  fentaNYL  (SUBLIMAZE ) injection 50 mcg (50 mcg Intravenous Given 08/27/23 0534)  droperidol  (INAPSINE ) 2.5 MG/ML injection 1.25 mg (1.25 mg Intravenous Given 08/27/23 0534)    Mobility walks     Focused Assessments See chart   R Recommendations: See Admitting Provider Note  Report given to:

## 2023-08-27 NOTE — H&P (Signed)
 History and Physical  Leonard [name] FMW:[id number] DOB: 02-27-1950 DOA: 08/27/2023  PCP: Benjamine Aland, MD   Chief Complaint: Abdominal pain, vomiting  HPI: Leonard [name] is a 73 y.o. male with medical history significant for heart failure with reduced EF, hypertension, paroxysmal atrial fibrillation not on anticoagulation admitted in May 2025 with acute alcoholic pancreatitis now being admitted with recurrent alcoholic pancreatitis after binge.  He was hospitalized 5/25 and [facility] treated in the usual fashion with fluids, pain and nausea medication and was discharged home in 4 days.  He says that he has continued to drink, more recently in the last week or so.  3 days ago he developed abdominal pain and vomiting.  Denies any fevers, chest pain or any other complication.  Workup in the ER as detailed below shows evidence of acute pancreatitis.  Currently he is resting comfortably says that his pain is significantly improved.  Review of Systems: Please see HPI for pertinent positives and negatives. A complete 10 system review of systems are otherwise negative.  Past Medical History:  Diagnosis Date   Alcohol abuse    Aortic insufficiency    Asthma    CAD (coronary artery disease)    a. cath 2018 with nonbstructive CAD (40% prox RCA, 25% distal RCA, 25% ramus, 20% mid LAD.   CKD (chronic kidney disease) stage 2, GFR 60-89 ml/min    COPD (chronic obstructive pulmonary disease) (HCC)    Gout    HFrEF (heart failure with reduced ejection fraction) (HCC)    Hypertension    Mitral regurgitation    NICM (nonischemic cardiomyopathy) (HCC)    PAF (paroxysmal atrial fibrillation) (HCC)    Prostate cancer (HCC)    Renal artery stenosis (HCC)    a. mild left renal artery stenosis by cath 2018.   Tobacco abuse    Past Surgical History:  Procedure Laterality Date   APPENDECTOMY     HYDROCELE EXCISION Left 12/01/2015   Procedure: REPAIR OF LEFT HYDROCELE;  Surgeon: Gretel Ferrara, MD;  Location: [facility];  Service: Urology;  Laterality: Left;   LYMPHADENECTOMY Bilateral 09/24/2013   Procedure: LYMPHADENECTOMY;  Surgeon: Gretel Ferrara, MD;  Location: [facility];  Service: Urology;  Laterality: Bilateral;   PROSTATE BIOPSY     RIGHT/LEFT HEART CATH AND CORONARY ANGIOGRAPHY N/A 06/10/2016   Procedure: Right/Left Heart Cath and Coronary Angiography;  Surgeon: Candyce GORMAN Reek, MD;  Location: [facility] INVASIVE CV LAB;  Service: Cardiovascular;  Laterality: N/A;   ROBOT ASSISTED LAPAROSCOPIC RADICAL PROSTATECTOMY N/A 09/24/2013   Procedure: ROBOTIC ASSISTED LAPAROSCOPIC RADICAL PROSTATECTOMY LEVEL 2;  Surgeon: Gretel Ferrara, MD;  Location: [facility];  Service: Urology;  Laterality: N/A;   Social History:  reports that he has been smoking cigarettes. He started smoking about 37 years ago. He has a 7.5 pack-year smoking history. He has never used smokeless tobacco. He reports current alcohol use of about 6.0 - 7.0 standard drinks of alcohol per week. He reports that he does not use drugs.  Allergies  Allergen Reactions   Losartan  Rash and Other (See Comments)    Abdominal and leg rashes noted in 12/17    Family History  Problem Relation Age of Onset   Hypertension Sister    Cancer Neg Hx      Prior to Admission medications   Medication Sig Start Date End Date Taking? Authorizing Provider  albuterol  (PROVENTIL  HFA;VENTOLIN  HFA) 108 (90 Base) MCG/ACT inhaler Inhale 2 puffs into the lungs every 4 (four) hours as needed for wheezing  or shortness of breath. Patient taking differently: Inhale 1-2 puffs into the lungs every 6 (six) hours as needed for wheezing or shortness of breath. 08/10/17  Yes Horton, Charmaine FALCON, MD  albuterol  (PROVENTIL ) (2.5 MG/3ML) 0.083% nebulizer solution Take 3 mLs (2.5 mg total) by nebulization every 6 (six) hours as needed for wheezing or shortness of breath. 01/19/16  Yes Donnald Delon DASEN, MD  allopurinol  (ZYLOPRIM ) 100 MG tablet Take 100 mg by mouth daily. 01/11/21  Yes [provider]  amLODipine  (NORVASC ) 5 MG tablet Take 1 tablet by mouth daily Patient taking differently: Take 5 mg by mouth at bedtime. Take 1 tablet by mouth daily 04/25/18  Yes Parthenia Olivia HERO, PA-C  BREO ELLIPTA  100-25 MCG/ACT AEPB Inhale 1 puff into the lungs daily as needed. 05/11/23  Yes [provider]  carvedilol  (COREG ) 25 MG tablet Take 25 mg by mouth 2 (two) times daily with a meal.   Yes [provider]  ENTRESTO  97-103 MG Take 1 tablet by mouth 2 (two) times daily. 10/04/18  Yes [provider]  furosemide  (LASIX ) 40 MG tablet Take 1 tablet (40 mg total) by mouth daily. You may take an extra tablet in the PM AS NEEDED for swelling up to twice a week Patient taking differently: Take 40 mg by mouth daily. 09/25/19  Yes Dann Candyce RAMAN, MD  gabapentin  (NEURONTIN ) 100 MG capsule Take 100 mg by mouth at bedtime.   Yes [provider]    Physical Exam: BP (!) 184/77   Pulse (!) 56   Temp 98.1 F (36.7 C)   Resp 18   Ht 5' 7 (1.702 m)   Wt 72.6 kg   SpO2 97%   BMI 25.06 kg/m  General:  Alert, oriented, calm, in no acute distress  Eyes: EOMI, clear conjuctivae, white sclerea Neck: supple, no masses, trachea mildline  Cardiovascular: RRR, no murmurs or rubs, no peripheral edema  Respiratory: clear to auscultation bilaterally, no wheezes, no crackles  Abdomen: soft, minimal abdominal tenderness, no guarding or rebound, nondistended, normal bowel tones heard  Skin: dry, no rashes  Musculoskeletal: no joint effusions, normal range of motion  Psychiatric: appropriate affect, normal speech  Neurologic: extraocular muscles intact, clear speech, moving all extremities with intact sensorium         Labs on Admission:  Basic Metabolic Panel: Recent Labs  Lab 08/27/23 0405  NA 139  K 3.7  CL 103  CO2 24  GLUCOSE 121*  BUN 24*  CREATININE 1.06  CALCIUM  9.7   Liver Function Tests: Recent Labs  Lab 08/27/23 0405  AST 21  ALT 11   ALKPHOS 60  BILITOT 1.0  PROT 7.5  ALBUMIN 4.1   Recent Labs  Lab 08/27/23 0405  LIPASE 384*   No results for input(s): AMMONIA in the last 168 hours. CBC: Recent Labs  Lab 08/27/23 0405  WBC 10.9*  NEUTROABS 8.2*  HGB 13.1  HCT 37.2*  MCV 98.9  PLT 199   Cardiac Enzymes: No results for input(s): CKTOTAL, CKMB, CKMBINDEX, TROPONINI in the last 168 hours. BNP (last 3 results) No results for input(s): BNP in the last 8760 hours.  ProBNP (last 3 results) No results for input(s): PROBNP in the last 8760 hours.  CBG: No results for input(s): GLUCAP in the last 168 hours.  Radiological Exams on Admission: No results found. Assessment/Plan Marty Uy is a 73 y.o. male with medical history significant for heart failure with reduced EF, hypertension, paroxysmal atrial fibrillation  not on anticoagulation admitted in May 2025 with acute alcoholic pancreatitis now being admitted with recurrent alcoholic pancreatitis after binge.  Acute alcoholic pancreatitis-this is a recurrent problem for him, he did binge drink about 4 days ago, started having abdominal pain and nausea thereafter.  No fevers, severe abdominal pain, evidence of sepsis. -Inpatient admission -IV fluids -N.p.o. except for ice chips and meds -Pain and nausea medication as needed  Hypertension-continue home amlodipine   Heart failure with reduced EF-with recovered EF, patient is currently hemodynamically stable and appears euvolemic on exam -Continue Entresto , Coreg   Alcohol abuse-currently without evidence of alcohol withdrawal, no withdrawal symptoms were documented during his prior hospitalization -Thiamine , folate, multivitamin -P.o. Ativan  per CIWA protocol  Paroxysmal atrial fibrillation-currently in sinus rhythm, patient is documented to have refused anticoagulation multiple times in the past  DVT prophylaxis: Lovenox      Code Status: Full Code  Consults called: None  Admission  status: The appropriate patient status for this patient is INPATIENT. Inpatient status is judged to be reasonable and necessary in order to provide the required intensity of service to ensure the patient's safety. The patient's presenting symptoms, physical exam findings, and initial radiographic and laboratory data in the context of their chronic comorbidities is felt to place them at high risk for further clinical deterioration. Furthermore, it is not anticipated that the patient will be medically stable for discharge from the hospital within 2 midnights of admission.    I certify that at the point of admission it is my clinical judgment that the patient will require inpatient hospital care spanning beyond 2 midnights from the point of admission due to high intensity of service, high risk for further deterioration and high frequency of surveillance required  Time spent: 49 minutes  Srinivas Lippman CHRISTELLA Gail MD [facility] Pager [phone]  If 7PM-7AM, please contact night-coverage [email or web address] Password [email or web address]  08/27/2023, 8:24 AM

## 2023-08-27 NOTE — ED Notes (Signed)
 MD made aware of pt's upward trending BP

## 2023-08-27 NOTE — ED Triage Notes (Signed)
 BIBA from home reports upper abdominal pain for 2 days at a 7/10 . Reports of N/V.  Hx of pancreatitis 182/100 P 68 CBG 116

## 2023-08-28 DIAGNOSIS — K859 Acute pancreatitis without necrosis or infection, unspecified: Secondary | ICD-10-CM

## 2023-08-28 LAB — CBC
HCT: 38.8 % — ABNORMAL LOW (ref 39.0–52.0)
Hemoglobin: 13.2 g/dL (ref 13.0–17.0)
MCH: 34.6 pg — ABNORMAL HIGH (ref 26.0–34.0)
MCHC: 34 g/dL (ref 30.0–36.0)
MCV: 101.6 fL — ABNORMAL HIGH (ref 80.0–100.0)
Platelets: 182 K/uL (ref 150–400)
RBC: 3.82 MIL/uL — ABNORMAL LOW (ref 4.22–5.81)
RDW: 13 % (ref 11.5–15.5)
WBC: 9 K/uL (ref 4.0–10.5)
nRBC: 0 % (ref 0.0–0.2)

## 2023-08-28 LAB — BASIC METABOLIC PANEL WITH GFR
Anion gap: 13 (ref 5–15)
BUN: 13 mg/dL (ref 8–23)
CO2: 23 mmol/L (ref 22–32)
Calcium: 9.4 mg/dL (ref 8.9–10.3)
Chloride: 103 mmol/L (ref 98–111)
Creatinine, Ser: 0.78 mg/dL (ref 0.61–1.24)
GFR, Estimated: >60 mL/min (ref >60)
Glucose, Bld: 99 mg/dL (ref 70–99)
Potassium: 3.7 mmol/L (ref 3.5–5.1)
Sodium: 139 mmol/L (ref 135–145)

## 2023-08-28 MED ORDER — MELATONIN 5 MG PO TABS
5.0000 mg | ORAL_TABLET | Freq: Every evening | ORAL | Status: DC | PRN
  Administered 2023-08-28 – 2023-08-29 (×2): 5 mg via ORAL
  Filled 2023-08-28 (×2): qty 1

## 2023-08-28 MED ORDER — FLUTICASONE FUROATE-VILANTEROL 100-25 MCG/ACT IN AEPB
1.0000 | INHALATION_SPRAY | Freq: Every day | RESPIRATORY_TRACT | Status: DC
  Administered 2023-08-29 – 2023-08-30 (×2): 1 via RESPIRATORY_TRACT
  Filled 2023-08-28: qty 28

## 2023-08-28 MED ORDER — GABAPENTIN 100 MG PO CAPS
100.0000 mg | ORAL_CAPSULE | Freq: Every day | ORAL | Status: DC
  Administered 2023-08-28 – 2023-08-29 (×2): 100 mg via ORAL
  Filled 2023-08-28 (×2): qty 1

## 2023-08-28 MED ORDER — NICOTINE 21 MG/24HR TD PT24
21.0000 mg | MEDICATED_PATCH | Freq: Every day | TRANSDERMAL | Status: DC
  Administered 2023-08-28 – 2023-08-30 (×3): 21 mg via TRANSDERMAL
  Filled 2023-08-28 (×3): qty 1

## 2023-08-28 MED ORDER — PANTOPRAZOLE SODIUM 40 MG PO TBEC
40.0000 mg | DELAYED_RELEASE_TABLET | Freq: Every day | ORAL | Status: DC
  Administered 2023-08-28 – 2023-08-30 (×3): 40 mg via ORAL
  Filled 2023-08-28 (×3): qty 1

## 2023-08-28 NOTE — Progress Notes (Signed)
 [facility] Progress Note  Patient: Leonard [name]     FMW:[id number]  DOA: 08/27/2023   PCP: Benjamine Aland, MD       Brief hospital course: This is a 73 year old male with history of HFpEF, hypertension, paroxysmal atrial fibrillation, chronic daily alcohol use, alcoholic pancreatitis and prostate cancer who presents to the hospital for abdominal pain and vomiting.  Diagnosed with acute pancreatitis. In ED, found to have a lipase of 384. CT scan of the abdomen pelvis reveals edema and inflammation around the pancreatic head, body tracking along the proximal transverse duodenum.  Subjective:  Abdominal pain has improved significantly.  No complaints of nausea or vomiting.  Assessment and Plan: Principal Problem:   Acute pancreatitis - In the setting of alcohol abuse and cholelithiasis - He was admitted on 06/13/2023 for acute pancreatitis as well - Continue IV fluids, start clear liquid diet, continue pain medication as needed - Has history of recurrent pancreatitis and also choledocholithiasis in the past-General Surgery consulted to evaluate for need for cholecystectomy  Active Problems:  Chronic alcohol abuse - The patient states that he drinks whiskey daily and occasionally drinks beer-he states that over the past week he is drinking much more than usual - He complains of some anxiety but at this time no other symptoms of alcohol withdrawal - Have counseled him to stop drinking alcohol - Continue CIWA scale and as needed Ativan  - Continue thiamine  and folic acid      Heart failure with reduced ejection fraction (HCC) Nonischemic cardiomyopathy and coronary artery disease - Cardiac cath in 2018 revealed 40% proximal RCA, 25% distal RCA, 20% mid LAD stenosis and an EF of 35 to 45% - Based on last echo in 2022, EF had improved to 60 to 65% - Currently holding Lasix  while hydrating for acute pancreatitis - Continue carvedilol  and Entresto   PAF History of bradycardia and  second-degree heart block - Carvedilol  reduced to 12.5 mg twice daily on last cardiology visit (05/29/2023) - Has declined anticoagulation on multiple occasions in the past    Tobacco abuse/COPD - I have counseled him to try to stop smoking - Nicotine  patch started - Continue Symbicort   Malignant neoplasm of prostate (HCC)  Gout - Continue allopurinol     Code Status: Full Code Total time on patient care: 35 minutes DVT prophylaxis: Lovenox   Objective:   Vitals:   08/27/23 2228 08/28/23 0239 08/28/23 0600 08/28/23 0916  BP:  (!) 169/83  (!) 166/90  Pulse: 61 65 69 67  Resp:  18  18  Temp:  98.6 F (37 C)  97.9 F (36.6 C)  TempSrc:    Oral  SpO2:  95%  98%  Weight:      Height:       Filed Weights   08/27/23 0400  Weight: 72.6 kg   Exam: General exam: Appears comfortable  HEENT: oral mucosa moist Respiratory system: Clear to auscultation.  Cardiovascular system: S1 & S2 heard  Gastrointestinal system: Abdomen soft, mild tenderness in the epigastrium, nondistended. Normal bowel sounds   Extremities: No cyanosis, clubbing or edema Psychiatry:  Mood & affect appropriate.      CBC: Recent Labs  Lab 08/27/23 0405 08/28/23 0528  WBC 10.9* 9.0  NEUTROABS 8.2*  --   HGB 13.1 13.2  HCT 37.2* 38.8*  MCV 98.9 101.6*  PLT 199 182   Basic Metabolic Panel: Recent Labs  Lab 08/27/23 0405 08/28/23 0528  NA 139 139  K 3.7 3.7  CL 103 103  CO2 24 23  GLUCOSE 121* 99  BUN 24* 13  CREATININE 1.06 0.78  CALCIUM  9.7 9.4     Scheduled Meds:  allopurinol   100 mg Oral Daily   amLODipine   5 mg Oral QHS   carvedilol   25 mg Oral BID WC   enoxaparin  (LOVENOX ) injection  40 mg Subcutaneous Q24H   folic acid   1 mg Oral Daily   multivitamin with minerals  1 tablet Oral Daily   nicotine   21 mg Transdermal Daily   pantoprazole   40 mg Oral Daily   sacubitril -valsartan   1 tablet Oral BID   thiamine   100 mg Oral Daily   Or   thiamine   100 mg Intravenous Daily     Imaging and lab data personally reviewed   Author: Takara Sermons  08/28/2023 1:22 PM  To contact [facility]>   Check the care team in [facility] and look for the attending/consulting [facility] provider listed  Log into [email or web address] and use [facility] [place]'s universal password   Go to> [facility]  and find provider  If you still have difficulty reaching the provider, please page the [facility] (Director on Call) for the Hospitalists listed on amion

## 2023-08-28 NOTE — Plan of Care (Signed)
  Problem: Education: Goal: Knowledge of General Education information will improve Description: Including pain rating scale, medication(s)/side effects and non-pharmacologic comfort measures Outcome: Progressing   Problem: Clinical Measurements: Goal: Diagnostic test results will improve Outcome: Progressing   Problem: Nutrition: Goal: Adequate nutrition will be maintained Outcome: Progressing   Problem: Coping: Goal: Level of anxiety will decrease Outcome: Progressing   Problem: Pain Managment: Goal: General experience of comfort will improve and/or be controlled Outcome: Progressing

## 2023-08-28 NOTE — Consult Note (Addendum)
 CC: Nausea, vomiting, upper abdominal pain  Requesting provider: Dr. Earley  Reason for consult: Evaluate to see if gallstones to reason for pancreatitis  HPI: Leonard [name] is an 73 y.o. male with a past medical history of alcohol abuse, tobacco dependence, coronary artery disease, heart failure, hypertension, paroxysmal A-fib, pancreatitis was admitted yesterday with a several day history of upper abdominal pain, nausea and vomiting.  He states that his symptoms began about 3 days prior to coming to the hospital.  He had been admitted in May 2025 with alcoholic pancreatitis.  He states that he went on a drinking binge around 4 July because he had some friends come up from [place] and continue to drink heavily.  He states he also overindulged in fried and fatty greasy foods as well recently.  He is pain is generally in the upper abdomen.  He describes it as a cramp sensation.  It does not radiate to his back.  It is generally accompanied with nausea and vomiting.  He also had some diarrhea at times.  Remote history of robotic prostatectomy  He was supposed to get updated echocardiogram and carotid ultrasound for carotid bruit at the end of May.  He states that he has difficulty arranging transportation and could not get transportation to that appointment.  He denies any TIAs or amaurosis fugax  He smokes about half a pack per day.  He had a hospitalization in September 2022 for pancreatitis.  At that time his LFTs were elevated and had known cholelithiasis.  CT imaging at that time suggested possible choledocholithiasis but follow-up MRI showed no choledocholithiasis.  it was felt that admission was probably due to gallstone pancreatitis.  The patient left AMA before surgery could be performed.  He had had cardiac clearance during that admission.  He also has known heterogeneous wall thickening at the gallbladder fundus that dates back to 2015 consistent with adenomyomatosis   Past  Medical History:  Diagnosis Date   Alcohol abuse    Aortic insufficiency    Asthma    CAD (coronary artery disease)    a. cath 2018 with nonbstructive CAD (40% prox RCA, 25% distal RCA, 25% ramus, 20% mid LAD.   CKD (chronic kidney disease) stage 2, GFR 60-89 ml/min    COPD (chronic obstructive pulmonary disease) (HCC)    Gout    HFrEF (heart failure with reduced ejection fraction) (HCC)    Hypertension    Mitral regurgitation    NICM (nonischemic cardiomyopathy) (HCC)    PAF (paroxysmal atrial fibrillation) (HCC)    Prostate cancer (HCC)    Renal artery stenosis (HCC)    a. mild left renal artery stenosis by cath 2018.   Tobacco abuse     Past Surgical History:  Procedure Laterality Date   APPENDECTOMY     HYDROCELE EXCISION Left 12/01/2015   Procedure: REPAIR OF LEFT HYDROCELE;  Surgeon: Gretel Ferrara, MD;  Location: [facility];  Service: Urology;  Laterality: Left;   LYMPHADENECTOMY Bilateral 09/24/2013   Procedure: LYMPHADENECTOMY;  Surgeon: Gretel Ferrara, MD;  Location: [facility];  Service: Urology;  Laterality: Bilateral;   PROSTATE BIOPSY     RIGHT/LEFT HEART CATH AND CORONARY ANGIOGRAPHY N/A 06/10/2016   Procedure: Right/Left Heart Cath and Coronary Angiography;  Surgeon: Candyce GORMAN Reek, MD;  Location: [facility] INVASIVE CV LAB;  Service: Cardiovascular;  Laterality: N/A;   ROBOT ASSISTED LAPAROSCOPIC RADICAL PROSTATECTOMY N/A 09/24/2013   Procedure: ROBOTIC ASSISTED LAPAROSCOPIC RADICAL PROSTATECTOMY LEVEL 2;  Surgeon: Gretel Ferrara, MD;  Location: [facility];  Service: Urology;  Laterality: N/A;    Family History  Problem Relation Age of Onset   Hypertension Sister    Cancer Neg Hx     Social:  reports that he has been smoking cigarettes. He started smoking about 37 years ago. He has a 7.5 pack-year smoking history. He has never used smokeless tobacco. He reports current alcohol use of about 6.0 - 7.0 standard drinks of alcohol per week. He reports that he does not use  drugs.  Allergies:  Allergies  Allergen Reactions   Losartan  Rash and Other (See Comments)    Abdominal and leg rashes noted in 12/17    Medications: I have reviewed the patient's current medications.   ROS - all of the below systems have been reviewed with the patient and positives are indicated with bold text General: chills, fever or night sweats Eyes: blurry vision or double vision ENT: epistaxis or sore throat Allergy/Immunology: itchy/watery eyes or nasal congestion Hematologic/Lymphatic: bleeding problems, blood clots or swollen lymph nodes Endocrine: temperature intolerance or unexpected weight changes Breast: new or changing breast lumps or nipple discharge Resp: cough, shortness of breath, or wheezing CV: chest pain or dyspnea on exertion GI: as per HPI GU: dysuria, trouble voiding, or hematuria MSK: joint pain or joint stiffness Neuro: TIA or stroke symptoms Derm: pruritus and skin lesion changes Psych: anxiety and depression  PE Blood pressure (!) 151/86, pulse 75, temperature 98.4 F (36.9 C), temperature source Oral, resp. rate 18, height 5' 7 (1.702 m), weight 72.6 kg, SpO2 97%. Constitutional: NAD; conversant; no deformities Eyes: Moist conjunctiva; no lid lag; anicteric; PERRL Neck: Trachea midline; no thyromegaly Lungs: Normal respiratory effort; no tactile fremitus CV: RRR; no palpable thrills; no pitting edema GI: Abd soft, nondistended, tender to palpation in epigastric, no rebound or guarding; no palpable hepatosplenomegaly MSK: ; no clubbing/cyanosis Psychiatric: Appropriate affect; alert and oriented x3 Lymphatic: No palpable cervical or axillary lymphadenopathy Skin: No rash or lesions  Results for orders placed or performed during the hospital encounter of 08/27/23 (from the past 48 hours)  CBC with Differential     Status: Abnormal   Collection Time: 08/27/23  4:05 AM  Result Value Ref Range   WBC 10.9 (H) 4.0 - 10.5 K/uL   RBC 3.76 (L) 4.22  - 5.81 MIL/uL   Hemoglobin 13.1 13.0 - 17.0 g/dL   HCT 62.7 (L) 60.9 - 47.9 %   MCV 98.9 80.0 - 100.0 fL   MCH 34.8 (H) 26.0 - 34.0 pg   MCHC 35.2 30.0 - 36.0 g/dL   RDW 87.1 88.4 - 84.4 %   Platelets 199 150 - 400 K/uL   nRBC 0.0 0.0 - 0.2 %   Neutrophils Relative % 75 %   Neutro Abs 8.2 (H) 1.7 - 7.7 K/uL   Lymphocytes Relative 14 %   Lymphs Abs 1.5 0.7 - 4.0 K/uL   Monocytes Relative 9 %   Monocytes Absolute 1.0 0.1 - 1.0 K/uL   Eosinophils Relative 2 %   Eosinophils Absolute 0.2 0.0 - 0.5 K/uL   Basophils Relative 0 %   Basophils Absolute 0.0 0.0 - 0.1 K/uL   Immature Granulocytes 0 %   Abs Immature Granulocytes 0.03 0.00 - 0.07 K/uL    Comment: Performed at [facility], [address] [place], [place] [address]  Comprehensive metabolic panel     Status: Abnormal   Collection Time: 08/27/23  4:05 AM  Result Value Ref Range  Sodium 139 135 - 145 mmol/L   Potassium 3.7 3.5 - 5.1 mmol/L   Chloride 103 98 - 111 mmol/L   CO2 24 22 - 32 mmol/L   Glucose, Bld 121 (H) 70 - 99 mg/dL    Comment: Glucose reference range applies only to samples taken after fasting for at least 8 hours.   BUN 24 (H) 8 - 23 mg/dL   Creatinine, Ser 8.93 0.61 - 1.24 mg/dL   Calcium  9.7 8.9 - 10.3 mg/dL   Total Protein 7.5 6.5 - 8.1 g/dL   Albumin 4.1 3.5 - 5.0 g/dL   AST 21 15 - 41 U/L   ALT 11 0 - 44 U/L   Alkaline Phosphatase 60 38 - 126 U/L   Total Bilirubin 1.0 0.0 - 1.2 mg/dL   GFR, Estimated >39 >39 mL/min    Comment: (NOTE) Calculated using the CKD-EPI Creatinine Equation (2021)    Anion gap 12 5 - 15    Comment: Performed at [facility], [address], [place], [place] [address]  Lipase, blood     Status: Abnormal   Collection Time: 08/27/23  4:05 AM  Result Value Ref Range   Lipase 384 (H) 11 - 51 U/L    Comment: Performed at [facility], [address] [place], [place] [address]  Glucose, capillary     Status: Abnormal    Collection Time: 08/27/23  3:36 PM  Result Value Ref Range   Glucose-Capillary 111 (H) 70 - 99 mg/dL    Comment: Glucose reference range applies only to samples taken after fasting for at least 8 hours.  Basic metabolic panel     Status: None   Collection Time: 08/28/23  5:28 AM  Result Value Ref Range   Sodium 139 135 - 145 mmol/L   Potassium 3.7 3.5 - 5.1 mmol/L   Chloride 103 98 - 111 mmol/L   CO2 23 22 - 32 mmol/L   Glucose, Bld 99 70 - 99 mg/dL    Comment: Glucose reference range applies only to samples taken after fasting for at least 8 hours.   BUN 13 8 - 23 mg/dL   Creatinine, Ser 9.21 0.61 - 1.24 mg/dL   Calcium  9.4 8.9 - 10.3 mg/dL   GFR, Estimated >39 >39 mL/min    Comment: (NOTE) Calculated using the CKD-EPI Creatinine Equation (2021)    Anion gap 13 5 - 15    Comment: Performed at [facility], [address] [place], [place] [address]  CBC     Status: Abnormal   Collection Time: 08/28/23  5:28 AM  Result Value Ref Range   WBC 9.0 4.0 - 10.5 K/uL   RBC 3.82 (L) 4.22 - 5.81 MIL/uL   Hemoglobin 13.2 13.0 - 17.0 g/dL   HCT 61.1 (L) 60.9 - 47.9 %   MCV 101.6 (H) 80.0 - 100.0 fL   MCH 34.6 (H) 26.0 - 34.0 pg   MCHC 34.0 30.0 - 36.0 g/dL   RDW 86.9 88.4 - 84.4 %   Platelets 182 150 - 400 K/uL   nRBC 0.0 0.0 - 0.2 %    Comment: Performed at [facility], [address], [place], [place] [address]    No results found.  Imaging: Reviewed CT abdomen pelvis from May 2025  A/P: Alden Bensinger is an 73 y.o. male with  Acute on chronic pancreatitis-likely alcoholic CAD PAF Heart failure Alcohol abuse Tobacco dependence Cholelithiasis adenomyomatosis    I  think his pancreatitis for this admission is most likely due to alcoholic pancreatitis.  His LFTs are normal.  He admits to a recent drinking binge due to the holiday recently.  Can repeat comprehensive metabolic panel in the morning If LFTs are elevated and/or if his  pancreatitis is not defervesce quickly-low threshold to get abdominal imaging Does not need antibiotics for pancreatitis Would recommend getting updated echocardiogram and carotid ultrasound since he has transportation issues while he is here inpatient  Data reviewed: I reviewed hospital records from September 2022 including general surgery consult note, cardiology consult note, GI consult note, hospital records from May 2025, ER notes, admission H&P, daily progress note, cardiology office visit note April 2025, CT abdomen pelvis May 2025, ultrasound abdomen June 2023  High medical decision making   Camellia HERO. Tanda, MD, FACS General, Bariatric, & Minimally Invasive Surgery [facility]

## 2023-08-28 NOTE — Plan of Care (Signed)

## 2023-08-29 ENCOUNTER — Inpatient Hospital Stay (HOSPITAL_COMMUNITY)

## 2023-08-29 DIAGNOSIS — I428 Other cardiomyopathies: Secondary | ICD-10-CM

## 2023-08-29 DIAGNOSIS — K859 Acute pancreatitis without necrosis or infection, unspecified: Secondary | ICD-10-CM | POA: Diagnosis not present

## 2023-08-29 LAB — ECHOCARDIOGRAM COMPLETE
AR max vel: 3.05 cm2
AV Area VTI: 2.69 cm2
AV Area mean vel: 2.45 cm2
AV Mean grad: 3 mmHg
AV Peak grad: 6.5 mmHg
Ao pk vel: 1.27 m/s
Area-P 1/2: 2.97 cm2
Height: 67 in
S' Lateral: 3.3 cm
Weight: 2560 [oz_av]

## 2023-08-29 LAB — COMPREHENSIVE METABOLIC PANEL WITH GFR
ALT: 11 U/L (ref 0–44)
AST: 18 U/L (ref 15–41)
Albumin: 3.9 g/dL (ref 3.5–5.0)
Alkaline Phosphatase: 51 U/L (ref 38–126)
Anion gap: 12 (ref 5–15)
BUN: 11 mg/dL (ref 8–23)
CO2: 21 mmol/L — ABNORMAL LOW (ref 22–32)
Calcium: 9 mg/dL (ref 8.9–10.3)
Chloride: 103 mmol/L (ref 98–111)
Creatinine, Ser: 0.72 mg/dL (ref 0.61–1.24)
GFR, Estimated: >60 mL/min (ref >60)
Glucose, Bld: 99 mg/dL (ref 70–99)
Potassium: 3.4 mmol/L — ABNORMAL LOW (ref 3.5–5.1)
Sodium: 136 mmol/L (ref 135–145)
Total Bilirubin: 1.4 mg/dL — ABNORMAL HIGH (ref 0.0–1.2)
Total Protein: 7 g/dL (ref 6.5–8.1)

## 2023-08-29 MED ORDER — IOHEXOL 350 MG/ML SOLN
100.0000 mL | Freq: Once | INTRAVENOUS | Status: AC | PRN
  Administered 2023-08-29: 100 mL via INTRAVENOUS

## 2023-08-29 MED ORDER — OXYCODONE HCL 5 MG PO TABS: 5.0000 mg | ORAL_TABLET | ORAL | Status: DC | PRN

## 2023-08-29 MED ORDER — SODIUM CHLORIDE (PF) 0.9 % IJ SOLN
INTRAMUSCULAR | Status: AC
Start: 2023-08-29 — End: 2023-08-29
  Filled 2023-08-29: qty 50

## 2023-08-29 NOTE — Progress Notes (Signed)
 [facility] Progress Note  Patient: Leonard [name]     FMW:[id number]  DOA: 08/27/2023   PCP: Benjamine Aland, MD       Brief hospital course: This is a 73 year old male with history of HFpEF, hypertension, paroxysmal atrial fibrillation, chronic daily alcohol use, alcoholic pancreatitis and prostate cancer who presents to the hospital for abdominal pain and vomiting.  Diagnosed with acute pancreatitis. In ED, found to have a lipase of 384. CT scan of the abdomen pelvis reveals edema and inflammation around the pancreatic head, body tracking along the proximal transverse duodenum.  Subjective:  Continues to have abdominal pain but not as severe as before.  Assessment and Plan: Principal Problem:   Acute pancreatitis - In the setting of alcohol abuse and cholelithiasis - He was admitted on 06/13/2023 for acute pancreatitis as well - Has history of recurrent pancreatitis and also choledocholithiasis in the past -General Surgery recommends outpatient follow-up for cholecystectomy - Advance to full liquids today  Active Problems:  Chronic alcohol abuse - The patient states that he drinks whiskey daily and occasionally drinks beer-he states that over the past week he is drinking much more than usual - He complains of some anxiety but at this time no other symptoms of alcohol withdrawal - Have counseled him regarding alcohol cessation - Continue CIWA scale and as needed Ativan  - Continue thiamine  and folic acid      Heart failure with reduced ejection fraction (HCC) Nonischemic cardiomyopathy and coronary artery disease - Cardiac cath in 2018 revealed 40% proximal RCA, 25% distal RCA, 20% mid LAD stenosis and an EF of 35 to 45% - Based on last echo in 2022, EF had improved to 60 to 65% - Continue carvedilol  and Entresto   PAF History of bradycardia and second-degree heart block - Carvedilol  reduced to 12.5 mg twice daily on last cardiology visit (05/29/2023) - Has declined  anticoagulation on multiple occasions in the past    Tobacco abuse/COPD - I have counseled him to try to stop smoking - Nicotine  patch started - Continue Symbicort   Malignant neoplasm of prostate (HCC)  Gout - Continue allopurinol     Code Status: Full Code Total time on patient care: 35 minutes DVT prophylaxis: Lovenox   Objective:   Vitals:   08/29/23 0423 08/29/23 0551 08/29/23 0848 08/29/23 1120  BP: (!) 152/97   (!) 152/78  Pulse: 78 68  72  Resp: 18 20  19   Temp: 98.1 F (36.7 C)   97.7 F (36.5 C)  TempSrc: Oral   Oral  SpO2: 96%  94% 98%  Weight:      Height:       Filed Weights   08/27/23 0400  Weight: 72.6 kg   Exam: General exam: Appears comfortable  HEENT: oral mucosa moist Respiratory system: Clear to auscultation.  Cardiovascular system: S1 & S2 heard  Gastrointestinal system: Abdomen soft, mild tenderness in the epigastrium, nondistended. Normal bowel sounds   Extremities: No cyanosis, clubbing or edema Psychiatry:  Mood & affect appropriate.      CBC: Recent Labs  Lab 08/27/23 0405 08/28/23 0528  WBC 10.9* 9.0  NEUTROABS 8.2*  --   HGB 13.1 13.2  HCT 37.2* 38.8*  MCV 98.9 101.6*  PLT 199 182   Basic Metabolic Panel: Recent Labs  Lab 08/27/23 0405 08/28/23 0528 08/29/23 0458  NA 139 139 136  K 3.7 3.7 3.4*  CL 103 103 103  CO2 24 23 21*  GLUCOSE 121* 99 99  BUN 24* 13 11  CREATININE 1.06 0.78 0.72  CALCIUM  9.7 9.4 9.0     Scheduled Meds:  allopurinol   100 mg Oral Daily   amLODipine   5 mg Oral QHS   carvedilol   25 mg Oral BID WC   enoxaparin  (LOVENOX ) injection  40 mg Subcutaneous Q24H   fluticasone  furoate-vilanterol  1 puff Inhalation Daily   folic acid   1 mg Oral Daily   gabapentin   100 mg Oral QHS   multivitamin with minerals  1 tablet Oral Daily   nicotine   21 mg Transdermal Daily   pantoprazole   40 mg Oral Daily   sacubitril -valsartan   1 tablet Oral BID   thiamine   100 mg Oral Daily   Or   thiamine   100 mg  Intravenous Daily    Imaging and lab data personally reviewed   Author: Azariah Bonura  08/29/2023 2:24 PM  To contact [facility]>   Check the care team in [facility] and look for the attending/consulting [facility] provider listed  Log into [email or web address] and use [facility]'s universal password   Go to> [facility]  and find provider  If you still have difficulty reaching the provider, please page the [facility] (Director on Call) for the Hospitalists listed on amion

## 2023-08-29 NOTE — Progress Notes (Signed)
 Mobility Specialist - Progress Note   08/29/23 1258  Mobility  Activity Ambulated independently in hallway  Level of Assistance Independent  Assistive Device None  Distance Ambulated (ft) 500 ft  Activity Response Tolerated well  Mobility Referral Yes  Mobility visit 1 Mobility  Mobility Specialist Start Time (ACUTE ONLY) 1244  Mobility Specialist Stop Time (ACUTE ONLY) 1256  Mobility Specialist Time Calculation (min) (ACUTE ONLY) 12 min   Pt received in bed and agreeable to mobility. No complaints during session. Pt to EOB after session with all needs met. RN in room.   [facility]

## 2023-08-29 NOTE — TOC Initial Note (Signed)
 Transition of Care [facility]) - Initial/Assessment Note   Patient Details  Name: Leonard [name] MRN: [id number] Date of Birth: [birth year]  Transition of Care [facility]) CM/SW Contact:    Duwaine GORMAN Aran, LCSW Phone Number: 08/29/2023, 11:11 AM  Clinical Narrative: Care management consulted for ETOH use resources. Patient agreeable to having resources added to AVS. CSW added resources to AVS.  Expected Discharge Plan: Home/Self Care Barriers to Discharge: Continued Medical Work up  Patient Goals and CMS Choice Patient states their goals for this hospitalization and ongoing recovery are:: Get ETOH use resources Choice offered to / list presented to : NA  Expected Discharge Plan and Services In-house Referral: Clinical Social Work Post Acute Care Choice: NA Living arrangements for the past 2 months: Apartment           DME Arranged: N/A DME Agency: NA  Prior Living Arrangements/Services Living arrangements for the past 2 months: Apartment Lives with:: Self Patient language and need for interpreter reviewed:: Yes Do you feel safe going back to the place where you live?: Yes      Need for Family Participation in Patient Care: No (Comment) Care giver support system in place?: Yes (comment) Criminal Activity/Legal Involvement Pertinent to Current Situation/Hospitalization: No - Comment as needed  Activities of Daily Living ADL Screening (condition at time of admission) Independently performs ADLs?: Yes (appropriate for developmental age) Is the patient deaf or have difficulty hearing?: No Does the patient have difficulty seeing, even when wearing glasses/contacts?: No Does the patient have difficulty concentrating, remembering, or making decisions?: No  Emotional Assessment Appearance:: Appears stated age Attitude/Demeanor/Rapport: Engaged Affect (typically observed): Accepting Orientation: : Oriented to Self, Oriented to Place, Oriented to  Time, Oriented to Situation Alcohol / Substance  Use: Alcohol Use Psych Involvement: No (comment)  Admission diagnosis:  Acute pancreatitis [K85.90] Alcohol-induced acute pancreatitis, unspecified complication status [K85.20] Patient Active Problem List   Diagnosis Date Noted   Acute pancreatitis 08/27/2023   Alcohol induced acute pancreatitis 06/10/2023   Hyperbilirubinemia 06/10/2023   CAD (coronary artery disease) 04/06/2023   Paroxysmal atrial fibrillation (HCC) 04/06/2023   Aortic insufficiency 04/06/2023   Ascending aorta dilation (HCC) 04/06/2023   Nicotine  dependence 01/15/2022   Choledocholithiasis with obstruction 10/09/2020   Gallbladder mass 10/09/2020   Chronic HFrEF (heart failure with reduced ejection fraction) (HCC) 10/09/2020   AKI (acute kidney injury) (HCC) 10/27/2018   Hypotension 10/27/2018   Alcohol abuse 12/28/2017   Glaucoma 12/28/2017   Nonischemic cardiomyopathy (HCC) 10/17/2017   History of pulmonary embolus (PE) 10/17/2017   Pulmonary embolism (HCC) 02/28/2017   Acute exacerbation of congestive heart failure (HCC) 02/24/2017   CHF exacerbation (HCC) 02/24/2017   Hypertensive urgency 02/24/2017   Pulmonary edema 02/24/2017   Acute kidney injury superimposed on chronic kidney disease (HCC) 02/24/2017   Hypertensive heart disease 09/27/2016   Tobacco abuse 09/27/2016   Mixed hyperlipidemia 09/27/2016   History of tobacco abuse 06/25/2016   Allergic sinusitis 04/17/2016   Asthma 01/19/2016   CKD (chronic kidney disease) stage 2, GFR 60-89 ml/min 01/19/2016   Aspiration pneumonitis (HCC)    Essential hypertension    Heart failure with reduced ejection fraction (HCC) 01/10/2016   Malignant neoplasm of prostate (HCC) 07/03/2013   PCP:  Benjamine Aland, MD Pharmacy:   [facility] [id number] [address], [place] - [address] AT [facility] OF [address] [facility] [address] [place] [address] Phone: [phone] Fax: [phone]  [facility] - [place], [place] -  [address] [place] [address] Phone: [phone] Fax: [phone]  Social Drivers of Health (SDOH) Social History: SDOH Screenings   Food Insecurity: No Food Insecurity (08/27/2023)  Housing: Low Risk  (08/27/2023)  Transportation Needs: No Transportation Needs (08/27/2023)  Utilities: Not At Risk (08/27/2023)  Alcohol Screen: Low Risk  (11/06/2019)  Depression (PHQ2-9): Low Risk  (09/26/2020)  Financial Resource Strain: Low Risk  (11/06/2019)  Physical Activity: Inactive (11/06/2019)  Social Connections: Unknown (08/27/2023)  Stress: No Stress Concern Present (11/06/2019)  Tobacco Use: High Risk (08/27/2023)   SDOH Interventions:    Readmission Risk Interventions    06/13/2023    2:58 PM 06/11/2023    8:53 AM  Readmission Risk Prevention Plan  Post Dischage Appt Complete Complete  Medication Screening Complete Complete  Transportation Screening Complete Complete

## 2023-08-29 NOTE — Progress Notes (Signed)
      Chief Complaint/Subjective: Pain much improved  Objective: Vital signs in last 24 hours: Temp:  [97.7 F (36.5 C)-98.4 F (36.9 C)] 97.7 F (36.5 C) (07/21 1120) Pulse Rate:  [64-78] 72 (07/21 1120) Resp:  [18-22] 19 (07/21 1120) BP: (151-167)/(78-97) 152/78 (07/21 1120) SpO2:  [94 %-98 %] 98 % (07/21 1120) Last BM Date : 08/26/23 Intake/Output from previous day: 07/20 0701 - 07/21 0700 In: 4314.2 [P.O.:240; I.V.:4074.2] Out: 1650 [Urine:1650]  PE: Gen: NAD Resp: nonlabored Card: RRR Abd: soft, nontender  Lab Results:  Recent Labs    08/27/23 0405 08/28/23 0528  WBC 10.9* 9.0  HGB 13.1 13.2  HCT 37.2* 38.8*  PLT 199 182   Recent Labs    08/28/23 0528 08/29/23 0458  NA 139 136  K 3.7 3.4*  CL 103 103  CO2 23 21*  GLUCOSE 99 99  BUN 13 11  CREATININE 0.78 0.72  CALCIUM  9.4 9.0   No results for input(s): LABPROT, INR in the last 72 hours.    Component Value Date/Time   NA 136 08/29/2023 0458   NA 142 11/27/2020 1301   K 3.4 (L) 08/29/2023 0458   CL 103 08/29/2023 0458   CO2 21 (L) 08/29/2023 0458   GLUCOSE 99 08/29/2023 0458   BUN 11 08/29/2023 0458   BUN 6 (L) 11/27/2020 1301   CREATININE 0.72 08/29/2023 0458   CALCIUM  9.0 08/29/2023 0458   PROT 7.0 08/29/2023 0458   PROT 6.5 10/18/2017 0843   ALBUMIN 3.9 08/29/2023 0458   ALBUMIN 3.9 10/18/2017 0843   AST 18 08/29/2023 0458   ALT 11 08/29/2023 0458   ALKPHOS 51 08/29/2023 0458   BILITOT 1.4 (H) 08/29/2023 0458   BILITOT 1.0 10/18/2017 0843   GFRNONAA >60 08/29/2023 0458   GFRAA 76 04/24/2019 1441    Assessment/Plan Alcoholic pancreatitis. Has a history of stones -no plans for surgery during hospitalization -He can follow up with me in clinic if he wants to discuss potential gallbladder removal in the future  LOS: 2 days   I reviewed last 24 h vitals and pain scores, last 48 h intake and output, last 24 h labs and trends, and last 24 h imaging results.  This care required  moderate level of medical decision making.   Leonard [name] [facility] Surgery at [facility] 08/29/2023, 2:40 PM Please see Amion for pager number during day hours 7:00am-4:30pm or 7:00am -11:30am on weekends

## 2023-08-30 DIAGNOSIS — K859 Acute pancreatitis without necrosis or infection, unspecified: Secondary | ICD-10-CM | POA: Diagnosis not present

## 2023-08-30 DIAGNOSIS — K852 Alcohol induced acute pancreatitis without necrosis or infection: Secondary | ICD-10-CM | POA: Diagnosis not present

## 2023-08-30 LAB — BASIC METABOLIC PANEL WITH GFR
Anion gap: 9 (ref 5–15)
BUN: 9 mg/dL (ref 8–23)
CO2: 24 mmol/L (ref 22–32)
Calcium: 9.2 mg/dL (ref 8.9–10.3)
Chloride: 101 mmol/L (ref 98–111)
Creatinine, Ser: 0.83 mg/dL (ref 0.61–1.24)
GFR, Estimated: >60 mL/min (ref >60)
Glucose, Bld: 105 mg/dL — ABNORMAL HIGH (ref 70–99)
Potassium: 3.4 mmol/L — ABNORMAL LOW (ref 3.5–5.1)
Sodium: 134 mmol/L — ABNORMAL LOW (ref 135–145)

## 2023-08-30 LAB — CBC
HCT: 36.7 % — ABNORMAL LOW (ref 39.0–52.0)
Hemoglobin: 12.9 g/dL — ABNORMAL LOW (ref 13.0–17.0)
MCH: 34.5 pg — ABNORMAL HIGH (ref 26.0–34.0)
MCHC: 35.1 g/dL (ref 30.0–36.0)
MCV: 98.1 fL (ref 80.0–100.0)
Platelets: 165 K/uL (ref 150–400)
RBC: 3.74 MIL/uL — ABNORMAL LOW (ref 4.22–5.81)
RDW: 12.3 % (ref 11.5–15.5)
WBC: 6.5 K/uL (ref 4.0–10.5)
nRBC: 0 % (ref 0.0–0.2)

## 2023-08-30 MED ORDER — FOLIC ACID 1 MG PO TABS: 1.0000 mg | ORAL_TABLET | Freq: Every day | ORAL | 0 refills | Status: AC

## 2023-08-30 MED ORDER — THIAMINE HCL 100 MG PO TABS: 100.0000 mg | ORAL_TABLET | Freq: Every day | ORAL | 0 refills | Status: AC

## 2023-08-30 NOTE — Discharge Instructions (Signed)
 You were cared for by a hospitalist during your hospital stay. Please review all of you discharge paperwork on the day of discharge and be sure you have all of your prescribed medications and please read the below instructions:  Once you are discharged, your primary care physician will handle any further medical issues. Please note that NO REFILLS for any discharge medications will be authorized once you are discharged as it is imperative that you return to your primary care physician (or establish a relationship with a primary care physician if you do not have one) for your aftercare needs. Please obtain a follow up appointment with your primary care physician within 1-2 weeks of discharge. Please take all your medications with you for your next visit with your Primary MD. Please request your Primary MD to go over all Hospital Tests and Procedures, Radiological results at the follow up appointment. In some cases, there will be blood work, cultures and biopsy results pending at the time of your discharge. Please request that your primary care M.D. goes through all the records of your hospital data and follows up on these results. Please get all hospital records sent to your primary MD by signing hospital release before you go home or request your primary care doctor's office to assist with obtaining medical records.   You must read complete instructions/literature along with all the possible adverse reactions/side effects for all the medicines that have been prescribed to you. Take any new medicines after you have completely understood and accpet all the possible adverse reactions/side effects.  Please take medications as prescribed and speak with your doctor if changes are needed.   If you have smoked or chewed tobacco in the last 2 yrs please stop. Stop any regular alcohol  and or any recreational drug use. Wear Seat belts while driving.   If you had Pneumonia at the Hospital: Please get a 2 view  Chest X ray done in 6-8 weeks after hospital discharge or sooner if instructed by your Primary MD.   If you have Congestive Heart Failure: Follow a cardiac low salt diet and 1.5 lit/day fluid restriction. Please call your Cardiologist or Primary MD anytime you have any of the following symptoms:  1) 3 pound weight gain in 24 hours or 5 pounds in 1 week  2) shortness of breath, with or without a dry hacking cough  3) increasing swelling in the feet or stomach  4) if you have to sleep on extra pillows at night in order to breathe   If you have Diabetes: Check blood sugars 4 times/day- once on AM empty stomach and then before each meal. Log in all results and show them to your primary doctor at your next visit. If glucose readings are often under 60 or above 400 call your primary MD to see if medication dosages need to be adjusted   If you have Syncope (passing out) or Seizure/Convulsions/Epilepsy: Please do not drive, operate heavy machinery, participate in activities at heights or participate in high speed sports until you have seen by Primary MD or a Neurologist and advised to do so again. Per [facility] statutes, patients with seizures are not allowed to drive until they have been seizure-free for six months.  Use caution when using heavy equipment or power tools. Avoid working on ladders or at heights. Take showers instead of baths. Ensure the water temperature is not too high on the home water heater. Do not go swimming alone. Do not lock yourself  in a room alone (i.e. bathroom). When caring for infants or small children, sit down when holding, feeding, or changing them to minimize risk of injury to the child in the event you have a seizure. Maintain good sleep hygiene. Avoid alcohol.    If you had Gastrointestinal Bleeding: Please ask your Primary MD to check a complete blood count within one week of discharge or at your next visit. Your endoscopic/colonoscopic biopsies that are  pending at the time of discharge will also need to followed by your Primary MD.    Leonard [name] can reach the hospitalist office at phone [phone] or fax [phone]   If you do not have a primary care physician, you can call [phone] for a physician referral.

## 2023-08-30 NOTE — Discharge Summary (Incomplete)
 Physician Discharge Summary  Leonard [name] FMW:[id number] DOB: [birth year] DOA: 08/27/2023  PCP: Benjamine Aland, MD  Admit date: 08/27/2023 Discharge date: 08/30/2023 Discharging to: *** Recommendations for Outpatient Follow-up:  ***  Consults:  *** Procedures:  ***   Discharge Diagnoses:   Principal Problem:   Acute pancreatitis Active Problems:   Malignant neoplasm of prostate (HCC)   Heart failure with reduced ejection fraction (HCC)   Tobacco abuse   Choledocholithiasis with obstruction   Alcohol induced acute pancreatitis     Hospital Course:  ***  Principal Problem:   Acute pancreatitis Active Problems:   Malignant neoplasm of prostate (HCC)   Heart failure with reduced ejection fraction (HCC)   Tobacco abuse   Choledocholithiasis with obstruction   Alcohol induced acute pancreatitis    Body mass index is 25.06 kg/m. Nutrition Status:          Discharge Instructions   Allergies as of 08/30/2023       Reactions   Losartan  Rash, Other (See Comments)   Abdominal and leg rashes noted in 12/17     Med Rec must be completed prior to using this [facility]***           The results of significant diagnostics from this hospitalization (including imaging, microbiology, ancillary and laboratory) are listed below for reference.    ECHOCARDIOGRAM COMPLETE Result Date: 08/29/2023    ECHOCARDIOGRAM REPORT   Patient Name:   Leonard [name] Date of Exam: 08/29/2023 Medical Rec #:  [id number]      Height:       67.0 in Accession #:    [id number]     Weight:       160.0 lb Date of Birth:  November 10, 1950      BSA:          1.839 m Patient Age:    72 years       BP:           167/97 mmHg Patient Gender: M              HR:           65 bpm. Exam Location:  Inpatient Procedure: 2D Echo, Cardiac Doppler and Color Doppler (Both Spectral and Color            Flow Doppler were utilized during procedure). Indications:    Cardiomyopathy  History:        Patient has prior history of  Echocardiogram examinations. CAD,                 Arrythmias:Atrial Fibrillation; Risk Factors:Hypertension and                 Dyslipidemia.  Sonographer:    Christiana Mbomeh Referring Phys: [id number] TRUE ATLAS  Sonographer Comments: TDS. IMPRESSIONS  1. Left ventricular ejection fraction, by estimation, is 60 to 65%. The left ventricle has normal function. The left ventricle has no regional wall motion abnormalities. There is mild concentric left ventricular hypertrophy. Left ventricular diastolic parameters are consistent with Grade I diastolic dysfunction (impaired relaxation).  2. Right ventricular systolic function is normal. The right ventricular size is normal. Tricuspid regurgitation signal is inadequate for assessing PA pressure.  3. The mitral valve is normal in structure. No evidence of mitral valve regurgitation.  4. The aortic valve has an indeterminant number of cusps. Aortic valve regurgitation is trivial. Aortic valve sclerosis is present, with no evidence of aortic valve stenosis. Comparison(s): No significant change from prior study. Prior images reviewed  side by side. FINDINGS  Left Ventricle: Left ventricular ejection fraction, by estimation, is 60 to 65%. The left ventricle has normal function. The left ventricle has no regional wall motion abnormalities. The left ventricular internal cavity size was normal in size. There is  mild concentric left ventricular hypertrophy. Left ventricular diastolic parameters are consistent with Grade I diastolic dysfunction (impaired relaxation). Normal left ventricular filling pressure. Right Ventricle: The right ventricular size is normal. Right vetricular wall thickness was not well visualized. Right ventricular systolic function is normal. Tricuspid regurgitation signal is inadequate for assessing PA pressure. Left Atrium: Left atrial size was normal in size. Right Atrium: Right atrial size was normal in size. Pericardium: There is no evidence of pericardial  effusion. Mitral Valve: The mitral valve is normal in structure. No evidence of mitral valve regurgitation. Tricuspid Valve: The tricuspid valve is not well visualized. Tricuspid valve regurgitation is not demonstrated. Aortic Valve: The aortic valve has an indeterminant number of cusps. Aortic valve regurgitation is trivial. Aortic valve sclerosis is present, with no evidence of aortic valve stenosis. Aortic valve mean gradient measures 3.0 mmHg. Aortic valve peak gradient measures 6.5 mmHg. Aortic valve area, by VTI measures 2.69 cm. Pulmonic Valve: The pulmonic valve was not well visualized. Pulmonic valve regurgitation is not visualized. No evidence of pulmonic stenosis. Aorta: The aortic root is normal in size and structure and the ascending aorta was not well visualized. IAS/Shunts: The interatrial septum was not well visualized.  LEFT VENTRICLE PLAX 2D LVIDd:         4.70 cm   Diastology LVIDs:         3.30 cm   LV e' medial:    5.55 cm/s LV PW:         1.30 cm   LV E/e' medial:  8.2 LV IVS:        1.30 cm   LV e' lateral:   6.31 cm/s LVOT diam:     2.20 cm   LV E/e' lateral: 7.2 LV SV:         71 LV SV Index:   39 LVOT Area:     3.80 cm  RIGHT VENTRICLE RV S prime:     10.70 cm/s TAPSE (M-mode): 2.1 cm LEFT ATRIUM             Index LA Vol (A2C):   40.1 ml 21.80 ml/m LA Vol (A4C):   29.0 ml 15.77 ml/m LA Biplane Vol: 35.1 ml 19.08 ml/m  AORTIC VALVE AV Area (Vmax):    3.05 cm AV Area (Vmean):   2.45 cm AV Area (VTI):     2.69 cm AV Vmax:           127.00 cm/s AV Vmean:          85.900 cm/s AV VTI:            0.266 m AV Peak Grad:      6.5 mmHg AV Mean Grad:      3.0 mmHg LVOT Vmax:         102.00 cm/s LVOT Vmean:        55.400 cm/s LVOT VTI:          0.188 m LVOT/AV VTI ratio: 0.71  AORTA Ao Root diam: 3.30 cm MITRAL VALVE MV Area (PHT): 2.97 cm    SHUNTS MV E velocity: 45.40 cm/s  Systemic VTI:  0.19 m MV A velocity: 77.10 cm/s  Systemic Diam: 2.20 cm MV E/A ratio:  0.59 Mihai  Croitoru MD  Electronically signed by Jerel Balding MD Signature Date/Time: 08/29/2023/4:03:10 PM    Final    CT ANGIO CHEST AORTA W/CM & OR WO/CM Result Date: 08/29/2023 CLINICAL DATA:  Dilated ascending thoracic aorta EXAM: CT ANGIOGRAPHY CHEST WITH CONTRAST TECHNIQUE: Multidetector CT imaging of the chest was performed using the standard protocol during bolus administration of intravenous contrast. Multiplanar CT image reconstructions and MIPs were obtained to evaluate the vascular anatomy. RADIATION DOSE REDUCTION: This exam was performed according to the departmental dose-optimization program which includes automated exposure control, adjustment of the mA and/or kV according to patient size and/or use of iterative reconstruction technique. CONTRAST:  OMNIPAQUE  IOHEXOL  350 MG/ML SOLN COMPARISON:  CTA chest dated 02/28/2017 FINDINGS: Cardiovascular: Aortic Root: --Valve: 2.5 x 2.3 cm --Sinuses: 3.4 x 3.3 x 3.2 cm --Sinotubular Junction: 3.3 x 3.2 cm Limitations by motion: Moderate Thoracic Aorta: --Ascending Aorta: 4.1 x 3.8 cm --Aortic Arch: 3.5 x 3.5 cm --Descending Aorta: 2.9 x 2.8 cm Other: Normal heart size. No pericardial effusion. No central pulmonary emboli. Coronary artery calcifications and aortic atherosclerosis. Mediastinum/Nodes: Imaged thyroid gland without nodules meeting criteria for imaging follow-up by size. Normal esophagus. No pathologically enlarged axillary, supraclavicular, mediastinal, or hilar lymph nodes. Lungs/Pleura: The central airways are patent. Moderate diffuse bronchial wall thickening. Mild upper lobe predominant paraseptal emphysema. No focal consolidation. 2 mm subpleural left upper lobe nodule (6:52), unchanged, likely benign. No specific follow-up imaging recommended. No pneumothorax. Trace loculated pleural effusion at the right lung base (4:71). Upper abdomen: Subcentimeter segment 2 hypodensity in the liver (4:78), too small to characterize. Cholelithiasis. Partially imaged  gallbladder fundal adenomyomatosis. Partially imaged hypoattenuation of the pancreatic head, which may reflect sequela of chronic pancreatitis. Musculoskeletal: No acute or abnormal lytic or blastic osseous lesions. Multilevel degenerative changes of the thoracic spine. Review of the MIP images confirms the above findings. IMPRESSION: 1. Ascending thoracic aorta measures 4.1 cm. Recommend annual imaging followup by CTA or MRA. This recommendation follows 2010 ACCF/AHA/AATS/ACR/ASA/SCA/SCAI/SIR/STS/SVM Guidelines for the Diagnosis and Management of Patients with Thoracic Aortic Disease. Circulation. 2010; 121: Z733-z630. Aortic aneurysm NOS (ICD10-I71.9) 2. Smoking-related pulmonary findings as described. 3. Trace loculated right pleural effusion. 4. Partially imaged hypoattenuation of the pancreas, likely sequela of chronic pancreatitis. 5. Aortic Atherosclerosis (ICD10-I70.0) and Emphysema (ICD10-J43.9). Coronary artery calcifications. Assessment for potential risk factor modification, dietary therapy or pharmacologic therapy may be warranted, if clinically indicated. Electronically Signed   By: Limin  Xu M.D.   On: 08/29/2023 10:54   Labs:   Basic Metabolic Panel: Recent Labs  Lab 08/27/23 0405 08/28/23 0528 08/29/23 0458 08/30/23 0519  NA 139 139 136 134*  K 3.7 3.7 3.4* 3.4*  CL 103 103 103 101  CO2 24 23 21* 24  GLUCOSE 121* 99 99 105*  BUN 24* 13 11 9   CREATININE 1.06 0.78 0.72 0.83  CALCIUM  9.7 9.4 9.0 9.2     CBC: Recent Labs  Lab 08/27/23 0405 08/28/23 0528 08/30/23 0519  WBC 10.9* 9.0 6.5  NEUTROABS 8.2*  --   --   HGB 13.1 13.2 12.9*  HCT 37.2* 38.8* 36.7*  MCV 98.9 101.6* 98.1  PLT 199 182 165         SIGNED:   True Atlas, MD  [facility] 08/30/2023, 5:15 PM Time taking on discharge: 50 minutes

## 2023-08-30 NOTE — Discharge Summary (Signed)
 Physician Discharge Summary  Leonard [name] FMW:[id number] DOB: 11/01/1950 DOA: 08/27/2023  PCP: Benjamine Aland, MD  Admit date: 08/27/2023 Discharge date: 08/30/2023 Discharging to: home Recommendations for Outpatient Follow-up:  Will need general surgery for further discussions about cholecystectomy  Consults:  General surgery     Discharge Diagnoses:   Principal Problem:   Acute pancreatitis Active Problems:   Malignant neoplasm of prostate (HCC)   Heart failure with reduced ejection fraction (HCC)   Tobacco abuse   Choledocholithiasis with obstruction   Alcohol induced acute pancreatitis     Brief hospital course: This is a 73 year old male with history of HFpEF, hypertension, paroxysmal atrial fibrillation, chronic daily alcohol use, alcoholic pancreatitis and prostate cancer who presents to the hospital for abdominal pain and vomiting.  Diagnosed with acute pancreatitis. In ED, found to have a lipase of 384. CT scan of the abdomen pelvis reveals edema and inflammation around the pancreatic head, body tracking along the proximal transverse duodenum.   Subjective:  Abdominal pain has improved significantly.  No complaints of nausea or vomiting. Tolerating solid food today without issues.   Assessment and Plan: Principal Problem:   Acute pancreatitis - In the setting of alcohol abuse and cholelithiasis - Has history of recurrent pancreatitis  - He was admitted recently on 06/13/2023 for acute pancreatitis as well - improved with conservative managment  Active Problems:   Chronic alcohol abuse - The patient states that he drinks whiskey daily and occasionally drinks beer-he states that over the past week he is drinking much more than usual - He complains of some anxiety but no other symptoms of alcohol withdrawal - Have counseled him to stop drinking alcohol - Continue thiamine  and folic acid   Cholelithiasis, adenomyomatosis of the gallbladder and h/o  choledocholithiasis  - At the time he was admitted with Choledocholithiasis in 2022, he decided to leave [facility] - General Surgery consulted to evaluate for need for cholecystectomy and recommends outpatient follow up- discussed with patient   Heart failure with reduced ejection fraction (HCC) Nonischemic cardiomyopathy and coronary artery disease - Cardiac cath in 2018 revealed 40% proximal RCA, 25% distal RCA, 20% mid LAD stenosis and an EF of 35 to 45% - Based on last echo in 2022, EF had improved to 60 to 65% - Continue Lasix , carvedilol  and Entresto     PAF History of bradycardia and second-degree heart block - Carvedilol  reduced to 12.5 mg twice daily on last cardiology visit (05/29/2023) - Has declined anticoagulation      Tobacco abuse/COPD - I have counseled him to try to stop smoking - Continue Symbicort    Malignant neoplasm of prostate (HCC)   Gout - Continue allopurinol           Discharge Instructions   Allergies as of 08/30/2023       Reactions   Losartan  Rash, Other (See Comments)   Abdominal and leg rashes noted in 12/17        Medication List     TAKE these medications    albuterol  (2.5 MG/3ML) 0.083% nebulizer solution Commonly known as: PROVENTIL  Take 3 mLs (2.5 mg total) by nebulization every 6 (six) hours as needed for wheezing or shortness of breath. What changed: Another medication with the same name was changed. Make sure you understand how and when to take each.   albuterol  108 (90 Base) MCG/ACT inhaler Commonly known as: VENTOLIN  HFA Inhale 2 puffs into the lungs every 4 (four) hours as needed for wheezing or shortness of breath. What changed:  how much to take when to take this   allopurinol  100 MG tablet Commonly known as: ZYLOPRIM  Take 100 mg by mouth daily.   amLODipine  5 MG tablet Commonly known as: NORVASC  Take 1 tablet by mouth daily What changed:  how much to take how to take this when to take this   Breo Ellipta  100-25  MCG/ACT Aepb Generic drug: fluticasone  furoate-vilanterol Inhale 1 puff into the lungs daily as needed.   carvedilol  25 MG tablet Commonly known as: COREG  Take 25 mg by mouth 2 (two) times daily with a meal.   Entresto  97-103 MG Generic drug: sacubitril -valsartan  Take 1 tablet by mouth 2 (two) times daily.   folic acid  1 MG tablet Commonly known as: FOLVITE  Take 1 tablet (1 mg total) by mouth daily.   furosemide  40 MG tablet Commonly known as: LASIX  Take 1 tablet (40 mg total) by mouth daily. You may take an extra tablet in the PM AS NEEDED for swelling up to twice a week What changed: additional instructions   gabapentin  100 MG capsule Commonly known as: NEURONTIN  Take 100 mg by mouth at bedtime.   thiamine  100 MG tablet Commonly known as: VITAMIN B1 Take 1 tablet (100 mg total) by mouth daily.        Follow-up Information     Kinsinger, Herlene Righter, MD. Schedule an appointment as soon as possible for a visit.   Specialty: General Surgery Why: f/i in 3-4 wks to discuss removal of your gallbladder Contact information: [address] [place] [address] [phone]         Benjamine Aland, MD Follow up in 1 week(s).   Specialty: Family Medicine Contact information: [address] [place], [address] [place] [address] [phone]                    The results of significant diagnostics from this hospitalization (including imaging, microbiology, ancillary and laboratory) are listed below for reference.    ECHOCARDIOGRAM COMPLETE Result Date: 08/29/2023    ECHOCARDIOGRAM REPORT   Patient Name:   Leonard [name] Date of Exam: 08/29/2023 Medical Rec #:  [id number]      Height:       67.0 in Accession #:    [id number]     Weight:       160.0 lb Date of Birth:  Feb 27, 1950      BSA:          1.839 m Patient Age:    72 years       BP:           167/97 mmHg Patient Gender: M              HR:           65 bpm. Exam Location:  Inpatient Procedure: 2D Echo, Cardiac  Doppler and Color Doppler (Both Spectral and Color            Flow Doppler were utilized during procedure). Indications:    Cardiomyopathy  History:        Patient has prior history of Echocardiogram examinations. CAD,                 Arrythmias:Atrial Fibrillation; Risk Factors:Hypertension and                 Dyslipidemia.  Sonographer:    Christiana Mbomeh Referring Phys: [id number] TRUE ATLAS  Sonographer Comments: TDS. IMPRESSIONS  1. Left ventricular ejection fraction, by estimation, is 60 to  65%. The left ventricle has normal function. The left ventricle has no regional wall motion abnormalities. There is mild concentric left ventricular hypertrophy. Left ventricular diastolic parameters are consistent with Grade I diastolic dysfunction (impaired relaxation).  2. Right ventricular systolic function is normal. The right ventricular size is normal. Tricuspid regurgitation signal is inadequate for assessing PA pressure.  3. The mitral valve is normal in structure. No evidence of mitral valve regurgitation.  4. The aortic valve has an indeterminant number of cusps. Aortic valve regurgitation is trivial. Aortic valve sclerosis is present, with no evidence of aortic valve stenosis. Comparison(s): No significant change from prior study. Prior images reviewed side by side. FINDINGS  Left Ventricle: Left ventricular ejection fraction, by estimation, is 60 to 65%. The left ventricle has normal function. The left ventricle has no regional wall motion abnormalities. The left ventricular internal cavity size was normal in size. There is  mild concentric left ventricular hypertrophy. Left ventricular diastolic parameters are consistent with Grade I diastolic dysfunction (impaired relaxation). Normal left ventricular filling pressure. Right Ventricle: The right ventricular size is normal. Right vetricular wall thickness was not well visualized. Right ventricular systolic function is normal. Tricuspid regurgitation signal is  inadequate for assessing PA pressure. Left Atrium: Left atrial size was normal in size. Right Atrium: Right atrial size was normal in size. Pericardium: There is no evidence of pericardial effusion. Mitral Valve: The mitral valve is normal in structure. No evidence of mitral valve regurgitation. Tricuspid Valve: The tricuspid valve is not well visualized. Tricuspid valve regurgitation is not demonstrated. Aortic Valve: The aortic valve has an indeterminant number of cusps. Aortic valve regurgitation is trivial. Aortic valve sclerosis is present, with no evidence of aortic valve stenosis. Aortic valve mean gradient measures 3.0 mmHg. Aortic valve peak gradient measures 6.5 mmHg. Aortic valve area, by VTI measures 2.69 cm. Pulmonic Valve: The pulmonic valve was not well visualized. Pulmonic valve regurgitation is not visualized. No evidence of pulmonic stenosis. Aorta: The aortic root is normal in size and structure and the ascending aorta was not well visualized. IAS/Shunts: The interatrial septum was not well visualized.  LEFT VENTRICLE PLAX 2D LVIDd:         4.70 cm   Diastology LVIDs:         3.30 cm   LV e' medial:    5.55 cm/s LV PW:         1.30 cm   LV E/e' medial:  8.2 LV IVS:        1.30 cm   LV e' lateral:   6.31 cm/s LVOT diam:     2.20 cm   LV E/e' lateral: 7.2 LV SV:         71 LV SV Index:   39 LVOT Area:     3.80 cm  RIGHT VENTRICLE RV S prime:     10.70 cm/s TAPSE (M-mode): 2.1 cm LEFT ATRIUM             Index LA Vol (A2C):   40.1 ml 21.80 ml/m LA Vol (A4C):   29.0 ml 15.77 ml/m LA Biplane Vol: 35.1 ml 19.08 ml/m  AORTIC VALVE AV Area (Vmax):    3.05 cm AV Area (Vmean):   2.45 cm AV Area (VTI):     2.69 cm AV Vmax:           127.00 cm/s AV Vmean:          85.900 cm/s AV VTI:  0.266 m AV Peak Grad:      6.5 mmHg AV Mean Grad:      3.0 mmHg LVOT Vmax:         102.00 cm/s LVOT Vmean:        55.400 cm/s LVOT VTI:          0.188 m LVOT/AV VTI ratio: 0.71  AORTA Ao Root diam: 3.30 cm  MITRAL VALVE MV Area (PHT): 2.97 cm    SHUNTS MV E velocity: 45.40 cm/s  Systemic VTI:  0.19 m MV A velocity: 77.10 cm/s  Systemic Diam: 2.20 cm MV E/A ratio:  0.59 Mihai Croitoru MD Electronically signed by Jerel Balding MD Signature Date/Time: 08/29/2023/4:03:10 PM    Final    CT ANGIO CHEST AORTA W/CM & OR WO/CM Result Date: 08/29/2023 CLINICAL DATA:  Dilated ascending thoracic aorta EXAM: CT ANGIOGRAPHY CHEST WITH CONTRAST TECHNIQUE: Multidetector CT imaging of the chest was performed using the standard protocol during bolus administration of intravenous contrast. Multiplanar CT image reconstructions and MIPs were obtained to evaluate the vascular anatomy. RADIATION DOSE REDUCTION: This exam was performed according to the departmental dose-optimization program which includes automated exposure control, adjustment of the mA and/or kV according to patient size and/or use of iterative reconstruction technique. CONTRAST:  OMNIPAQUE  IOHEXOL  350 MG/ML SOLN COMPARISON:  CTA chest dated 02/28/2017 FINDINGS: Cardiovascular: Aortic Root: --Valve: 2.5 x 2.3 cm --Sinuses: 3.4 x 3.3 x 3.2 cm --Sinotubular Junction: 3.3 x 3.2 cm Limitations by motion: Moderate Thoracic Aorta: --Ascending Aorta: 4.1 x 3.8 cm --Aortic Arch: 3.5 x 3.5 cm --Descending Aorta: 2.9 x 2.8 cm Other: Normal heart size. No pericardial effusion. No central pulmonary emboli. Coronary artery calcifications and aortic atherosclerosis. Mediastinum/Nodes: Imaged thyroid gland without nodules meeting criteria for imaging follow-up by size. Normal esophagus. No pathologically enlarged axillary, supraclavicular, mediastinal, or hilar lymph nodes. Lungs/Pleura: The central airways are patent. Moderate diffuse bronchial wall thickening. Mild upper lobe predominant paraseptal emphysema. No focal consolidation. 2 mm subpleural left upper lobe nodule (6:52), unchanged, likely benign. No specific follow-up imaging recommended. No pneumothorax. Trace  loculated pleural effusion at the right lung base (4:71). Upper abdomen: Subcentimeter segment 2 hypodensity in the liver (4:78), too small to characterize. Cholelithiasis. Partially imaged gallbladder fundal adenomyomatosis. Partially imaged hypoattenuation of the pancreatic head, which may reflect sequela of chronic pancreatitis. Musculoskeletal: No acute or abnormal lytic or blastic osseous lesions. Multilevel degenerative changes of the thoracic spine. Review of the MIP images confirms the above findings. IMPRESSION: 1. Ascending thoracic aorta measures 4.1 cm. Recommend annual imaging followup by CTA or MRA. This recommendation follows 2010 ACCF/AHA/AATS/ACR/ASA/SCA/SCAI/SIR/STS/SVM Guidelines for the Diagnosis and Management of Patients with Thoracic Aortic Disease. Circulation. 2010; 121: Z733-z630. Aortic aneurysm NOS (ICD10-I71.9) 2. Smoking-related pulmonary findings as described. 3. Trace loculated right pleural effusion. 4. Partially imaged hypoattenuation of the pancreas, likely sequela of chronic pancreatitis. 5. Aortic Atherosclerosis (ICD10-I70.0) and Emphysema (ICD10-J43.9). Coronary artery calcifications. Assessment for potential risk factor modification, dietary therapy or pharmacologic therapy may be warranted, if clinically indicated. Electronically Signed   By: Limin  Xu M.D.   On: 08/29/2023 10:54   Labs:   Basic Metabolic Panel: Recent Labs  Lab 08/27/23 0405 08/28/23 0528 08/29/23 0458 08/30/23 0519  NA 139 139 136 134*  K 3.7 3.7 3.4* 3.4*  CL 103 103 103 101  CO2 24 23 21* 24  GLUCOSE 121* 99 99 105*  BUN 24* 13 11 9   CREATININE 1.06 0.78 0.72 0.83  CALCIUM  9.7 9.4  9.0 9.2     CBC: Recent Labs  Lab 08/27/23 0405 08/28/23 0528 08/30/23 0519  WBC 10.9* 9.0 6.5  NEUTROABS 8.2*  --   --   HGB 13.1 13.2 12.9*  HCT 37.2* 38.8* 36.7*  MCV 98.9 101.6* 98.1  PLT 199 182 165         SIGNED:   True Atlas, MD  [facility] 08/30/2023, 5:27 PM Time  taking on discharge: 50 minutes

## 2023-08-31 ENCOUNTER — Ambulatory Visit: Admitting: Physician Assistant

## 2023-09-08 ENCOUNTER — Encounter: Payer: Self-pay | Admitting: Emergency Medicine

## 2023-09-08 ENCOUNTER — Ambulatory Visit: Attending: Emergency Medicine | Admitting: Emergency Medicine

## 2023-09-08 VITALS — BP 120/74 | Ht 67.0 in | Wt 156.0 lb

## 2023-09-08 DIAGNOSIS — I2510 Atherosclerotic heart disease of native coronary artery without angina pectoris: Secondary | ICD-10-CM

## 2023-09-08 DIAGNOSIS — R0989 Other specified symptoms and signs involving the circulatory and respiratory systems: Secondary | ICD-10-CM

## 2023-09-08 DIAGNOSIS — I7121 Aneurysm of the ascending aorta, without rupture: Secondary | ICD-10-CM

## 2023-09-08 DIAGNOSIS — I10 Essential (primary) hypertension: Secondary | ICD-10-CM

## 2023-09-08 DIAGNOSIS — I480 Paroxysmal atrial fibrillation: Secondary | ICD-10-CM

## 2023-09-08 DIAGNOSIS — I428 Other cardiomyopathies: Secondary | ICD-10-CM

## 2023-09-08 DIAGNOSIS — I5032 Chronic diastolic (congestive) heart failure: Secondary | ICD-10-CM | POA: Diagnosis not present

## 2023-09-08 DIAGNOSIS — F1010 Alcohol abuse, uncomplicated: Secondary | ICD-10-CM

## 2023-09-08 MED ORDER — ASPIRIN 81 MG PO CAPS: 81.0000 mg | ORAL_CAPSULE | Freq: Every day | ORAL | 1 refills | Status: AC

## 2023-09-08 NOTE — Progress Notes (Signed)
 Cardiology Office Note:    Date:  09/08/2023  ID:  Leonard [name], DOB 1950/07/27, MRN [id number] PCP: No primary care provider on file.  [facility] Providers Cardiologist:  None Cardiology APP:  Rana Lum CROME, NP       Patient Profile:       Chief Complaint: 32-month follow-up History of Present Illness:  Leonard [name] is a 73 y.o. male with visit-pertinent history of HFrEF, NICM, CAD, hypertension, asthma, tobacco abuse, alcohol use, history of PE, paroxysmal atrial fibrillation (patient declined OAC), ascending thoracic aneurysm, COPD, CKD, prostate cancer, poor medication compliance  Patient initially established with cardiology service in 2018 with reduced EF seen on 2D echo.  He underwent left heart catheterization further evaluation that showed nonobstructive CAD and had a follow-up echo in 2019 that showed decreased LVEF of 30 to 35% with cardiomyopathy felt to be nonischemic.  He was diagnosed with A-fib with RVR on 10/2018 and left AMA and was seen for follow-up on 3/21 but declined AC at the time.  He was seen for follow-up on 10/08/2020 after missing several follow-ups to reestablish care.  During visit patient did note some increased leg edema.  He has noted to have no current labs and a chest lipoma/cyst noted on exam but chest CTA ordered.  He was seen in the ED 3 days later with severe epigastric right upper quadrant abdominal pain.  He underwent ultrasound was found to have cholelithiasis and underwent evaluation by cardiology for preoperative clearance.  2D echo showed showed improved LVEF of 60 to 65%, grade 1 DD, normal RV, mild MR, mild AI, mild dilation of ascending aorta measuring 40 mmHg.  He was granted clearance but left AMA.  He was admitted on 12/02/2022 with altered mental status in the setting of Remeron and alcohol use.  He wore a ZIO monitor placed by PCP that showed burst and second-degree heart block with a minimum heart rate of 28 bpm.  Minimum heart  rate noted early in the morning hours with no reoccurrence during waking hours.  He was last seen in office on 06/02/2023 for evaluation of bradycardia.  He was currently drinking half a gallon of vodka a week.  His carvedilol  was reduced to 12.5 mg twice daily.  Repeat echocardiogram is ordered however never completed.  Most recently he was admitted to the hospital on 7/19 for abdominal pains and vomiting and diagnosed with acute pancreatitis in the setting of alcohol use and cholelithiasis.  He does have a history of recurrent pancreatitis.  He was admitted on 06/13/2023 for acute pancreatitis as well.  He improved with conservative management.  He did undergo echocardiogram on 7/21 showing LVEF 60 to 65%, no RWMA, mild LVH, grade 1 DD, RV function and size normal, trivial aortic valve regurgitation.   Discussed the use of AI scribe software for clinical note transcription with the patient, who gave verbal consent to proceed.  History of Present Illness Leonard [name] is a 73 year old male who presents for 6-month follow-up.  Today he tells me he is doing well overall.  He is without acute cardiovascular concerns or complaints at this time.  Tells me he feels great overall.  Denies any chest pain, orthopnea, PND, leg swelling.  Will have some very mild intermittent dyspnea when he overexerts himself.  No palpitations or syncope.  At times will have some mild lightheadedness when he stands up too quickly.  He engages in light physical activity, walking around the building a couple  of times a week without exertional symptoms.  Alcohol use has decreased since hospital discharge, with four beers and three bottles of wine consumed over the past ten days. Smoking has reduced from two packs to half a pack per day, with plans to use nicotine  patches. .  Review of systems:  Please see the history of present illness. All other systems are reviewed and otherwise negative.      Studies Reviewed:         Echocardiogram 08/29/2023 1. Left ventricular ejection fraction, by estimation, is 60 to 65%. The  left ventricle has normal function. The left ventricle has no regional  wall motion abnormalities. There is mild concentric left ventricular  hypertrophy. Left ventricular diastolic  parameters are consistent with Grade I diastolic dysfunction (impaired  relaxation).   2. Right ventricular systolic function is normal. The right ventricular  size is normal. Tricuspid regurgitation signal is inadequate for assessing  PA pressure.   3. The mitral valve is normal in structure. No evidence of mitral valve  regurgitation.   4. The aortic valve has an indeterminant number of cusps. Aortic valve  regurgitation is trivial. Aortic valve sclerosis is present, with no  evidence of aortic valve stenosis.   Cardiac catheterization 06/10/2016 Prox RCA lesion, 40 %stenosed. Dist RCA lesion, 25 %stenosed. Ramus lesion, 25 %stenosed. Mid LAD lesion, 20 %stenosed. There is moderate left ventricular systolic dysfunction. LV end diastolic pressure is mildly elevated. The left ventricular ejection fraction is 35-45% by visual estimate. There is no aortic valve stenosis. Ao sat 93%, PA sat 61%. CO 4.5 L/min. CI 2.2. Normal pulmonary artery pressures. No AAA. Mild left renal artery stenosis. No right renal artery stenosis.   Nonobstructive coronary artery disease.  Patient with nonischemic cardiomyopathy, likely related to elevated blood pressure. Diagnostic Dominance: Right  Risk Assessment/Calculations:    CHA2DS2-VASc Score = 4   This indicates a 4.8% annual risk of stroke. The patient's score is based upon: CHF History: 1 HTN History: 1 Diabetes History: 0 Stroke History: 0 Vascular Disease History: 1 Age Score: 1 Gender Score: 0             Physical Exam:   VS:  BP 120/74 (BP Location: Left Arm, Patient Position: Sitting, Cuff Size: Normal)   Ht 5' 7 (1.702 m)   Wt 156 lb (70.8 kg)    BMI 24.43 kg/m    Wt Readings from Last 3 Encounters:  09/08/23 156 lb (70.8 kg)  08/27/23 160 lb (72.6 kg)  06/02/23 163 lb 12.8 oz (74.3 kg)    GEN: Well nourished, well developed in no acute distress NECK: No JVD; No carotid bruits CARDIAC: RRR, no murmurs, rubs, gallops RESPIRATORY:  Clear to auscultation without rales, wheezing or rhonchi  ABDOMEN: Soft, non-tender, non-distended EXTREMITIES:  No edema; No acute deformity      Assessment and Plan:  HFimpEF NICM Echocardiogram 08/2023 with LVEF 60 to 65%, no RWMA, mild LVH, grade 1 DD - Today patient appears euvolemic and well compensated on exam.  Has mild intermittent dyspnea that is chronic and stable.  He denies any chest pains, dyspnea, orthopnea, PND - No changes to current therapy.  Continue GDMT of carvedilol  12.5 mg twice daily, Entresto  97-103 mg twice daily, furosemide  40 mg daily - Low-sodium diet and daily weights encouraged - CMET today  Coronary artery disease Aortic atherosclerosis LHC 06/2016 with nonobstructive disease showing 40% proximal RCA, 25% distal RCA, 25% ramus, 20% mid LAD - Today patient is without  anginal symptoms.  Remains active without chest pains.  No indication further ischemic evaluation at this time - Not currently on ASA or statin therapy - Plan to start aspirin  81 mg daily - Plan for fasting lipid panel/LFTs today and start statin therapy based on results  Paroxysmal atrial fibrillation Diagnosed in 2020 Has previously denied anticoagulation in the past - He denies anticoagulation today.  He understands his stroke risk - Today he is without symptoms concerning for recurrent atrial fibrillation - He maintains sinus rhythm on exam - Encouraged rhythm monitoring at home with a Kardia device - Currently pending ZIO monitor that was previously placed on 05/2023 - Continue carvedilol  12.5 mg twice daily  Hypertension Blood pressure today is 120/74 and well-controlled - Continue amlodipine   5 mg daily, carvedilol  25 mg twice daily, and Entresto  97-103 mg twice daily  Alcohol abuse Previously admitted 5/2 and 7/19 for acute pancreatitis Was previously drinking half a gallon of vodka per week and now down to a couple of beers and 3 bottles of wine per week - Not interested in AA - He was counseled today to slowly reduce alcohol consumption - Continue thiamine  and folic acid   Tobacco use Previously smoked 2 packs/day, now reduced to half a pack per day - Plans to begin - Tobacco cessation encouraged  Carotid bruit Pending carotid ultrasound on 8/15  Bradycardia Second-degree heart block Episodes of sudden heart block with bradycardia.  Carvedilol  reduced to 12.5 mg twice daily on 4/24 - No recent episodes of syncope, lightheadedness, dizziness, or bradycardia - Pending ZIO results  Aneurysm of ascending aorta CT angio chest aorta 08/2023 showed ascending thoracic aorta measuring 4.1 cm - Plan for annual surveillance with CT      Dispo:  Return in about 3 months (around 12/09/2023).  He will need to establish with new cardiologist at upcoming visit.  Signed, Lum LITTIE Louis, NP

## 2023-09-08 NOTE — Patient Instructions (Signed)
 Medication Instructions:  START TAKING ASPIRIN  81 MG DAILY.  Lab Work: CMET AND FASTING LIPID PANEL TO BE DONE TODAY.   Testing/Procedures: NONE  Follow-Up: At [facility], you and your health needs are our priority.  As part of our continuing mission to provide you with exceptional heart care, our providers are all part of one team.  This team includes your primary Cardiologist (physician) and Advanced Practice Providers or APPs (Physician Assistants and Nurse Practitioners) who all work together to provide you with the care you need, when you need it.  Your next appointment:   3 MONTHS  Provider:   MADISON FOUNTAIN, DNP  We recommend signing up for the patient portal called MyChart.  Sign up information is provided on this After Visit Summary.  MyChart is used to connect with patients for Virtual Visits (Telemedicine).  Patients are able to view lab/test results, encounter notes, upcoming appointments, etc.  Non-urgent messages can be sent to your provider as well.   To learn more about what you can do with MyChart, go to [email or web address].   Other Instructions WILL SCHEDULE CAROTID ULTRASOUND TODAY TO BE DONE AT A LATER DATE.

## 2023-09-09 ENCOUNTER — Ambulatory Visit: Payer: Self-pay | Admitting: Emergency Medicine

## 2023-09-09 DIAGNOSIS — I2510 Atherosclerotic heart disease of native coronary artery without angina pectoris: Secondary | ICD-10-CM

## 2023-09-09 LAB — COMPREHENSIVE METABOLIC PANEL WITH GFR
ALT: 10 IU/L (ref 0–44)
AST: 20 IU/L (ref 0–40)
Albumin: 4.7 g/dL (ref 3.8–4.8)
Alkaline Phosphatase: 77 IU/L (ref 44–121)
BUN/Creatinine Ratio: 8 — ABNORMAL LOW (ref 10–24)
BUN: 8 mg/dL (ref 8–27)
Bilirubin Total: 0.6 mg/dL (ref 0.0–1.2)
CO2: 21 mmol/L (ref 20–29)
Calcium: 10.2 mg/dL (ref 8.6–10.2)
Chloride: 104 mmol/L (ref 96–106)
Creatinine, Ser: 0.96 mg/dL (ref 0.76–1.27)
Globulin, Total: 2.8 g/dL (ref 1.5–4.5)
Glucose: 85 mg/dL (ref 70–99)
Potassium: 4.5 mmol/L (ref 3.5–5.2)
Sodium: 143 mmol/L (ref 134–144)
Total Protein: 7.5 g/dL (ref 6.0–8.5)
eGFR: 83 mL/min/1.73 (ref >59)

## 2023-09-09 LAB — LIPID PANEL
Chol/HDL Ratio: 3.3 ratio (ref 0.0–5.0)
Cholesterol, Total: 148 mg/dL (ref 100–199)
HDL: 45 mg/dL (ref >39)
LDL Chol Calc (NIH): 81 mg/dL (ref 0–99)
Triglycerides: 120 mg/dL (ref 0–149)
VLDL Cholesterol Cal: 22 mg/dL (ref 5–40)

## 2023-09-13 MED ORDER — ROSUVASTATIN CALCIUM 10 MG PO TABS: 10.0000 mg | ORAL_TABLET | Freq: Every day | ORAL | 3 refills | Status: DC

## 2023-09-23 ENCOUNTER — Ambulatory Visit (HOSPITAL_COMMUNITY)
Admission: RE | Admit: 2023-09-23 | Discharge: 2023-09-23 | Disposition: A | Discharge status: Home / Self Care | Source: Ambulatory Visit | Attending: Emergency Medicine | Admitting: Emergency Medicine

## 2023-09-23 DIAGNOSIS — I5032 Chronic diastolic (congestive) heart failure: Secondary | ICD-10-CM | POA: Insufficient documentation

## 2023-09-23 DIAGNOSIS — I10 Essential (primary) hypertension: Secondary | ICD-10-CM | POA: Diagnosis present

## 2023-09-23 DIAGNOSIS — I2510 Atherosclerotic heart disease of native coronary artery without angina pectoris: Secondary | ICD-10-CM | POA: Insufficient documentation

## 2023-09-23 DIAGNOSIS — I480 Paroxysmal atrial fibrillation: Secondary | ICD-10-CM | POA: Insufficient documentation

## 2023-09-23 DIAGNOSIS — R0989 Other specified symptoms and signs involving the circulatory and respiratory systems: Secondary | ICD-10-CM | POA: Insufficient documentation

## 2023-12-02 ENCOUNTER — Ambulatory Visit: Attending: Emergency Medicine | Admitting: Emergency Medicine

## 2023-12-02 ENCOUNTER — Encounter: Payer: Self-pay | Admitting: Emergency Medicine

## 2023-12-02 VITALS — BP 142/82 | HR 91 | Ht 68.0 in | Wt 153.2 lb

## 2023-12-02 DIAGNOSIS — I441 Atrioventricular block, second degree: Secondary | ICD-10-CM

## 2023-12-02 DIAGNOSIS — Z79899 Other long term (current) drug therapy: Secondary | ICD-10-CM | POA: Diagnosis not present

## 2023-12-02 DIAGNOSIS — I2510 Atherosclerotic heart disease of native coronary artery without angina pectoris: Secondary | ICD-10-CM | POA: Diagnosis not present

## 2023-12-02 DIAGNOSIS — I7121 Aneurysm of the ascending aorta, without rupture: Secondary | ICD-10-CM

## 2023-12-02 DIAGNOSIS — I428 Other cardiomyopathies: Secondary | ICD-10-CM | POA: Diagnosis not present

## 2023-12-02 DIAGNOSIS — E785 Hyperlipidemia, unspecified: Secondary | ICD-10-CM

## 2023-12-02 DIAGNOSIS — F1010 Alcohol abuse, uncomplicated: Secondary | ICD-10-CM

## 2023-12-02 DIAGNOSIS — I6522 Occlusion and stenosis of left carotid artery: Secondary | ICD-10-CM

## 2023-12-02 DIAGNOSIS — I480 Paroxysmal atrial fibrillation: Secondary | ICD-10-CM

## 2023-12-02 DIAGNOSIS — I5020 Unspecified systolic (congestive) heart failure: Secondary | ICD-10-CM | POA: Diagnosis not present

## 2023-12-02 MED ORDER — ASPIRIN 81 MG PO TBEC: 81.0000 mg | DELAYED_RELEASE_TABLET | Freq: Every day | ORAL | 3 refills | Status: AC

## 2023-12-02 NOTE — Progress Notes (Signed)
 Cardiology Office Note:    Date:  12/02/2023  ID:  Leonard [name], DOB 09/01/1950, MRN [id number] PCP: [facility]  [facility] Providers Cardiologist:  Joelle VEAR Ren Donley, MD Cardiology APP:  Rana Lum CROME, NP       Patient Profile:       Chief Complaint: 85-month follow-up History of Present Illness:  Leonard [name] is a 73 y.o. male with visit-pertinent history of  HFrEF, NICM, CAD, hypertension, asthma, tobacco abuse, alcohol use, history of PE, paroxysmal atrial fibrillation (patient declined OAC), ascending thoracic aneurysm, COPD, CKD, prostate cancer, poor medication compliance   Patient initially established with cardiology service in 2018 with reduced EF seen on 2D echo.  He underwent left heart catheterization further evaluation that showed nonobstructive CAD and had a follow-up echo in 2019 that showed decreased LVEF of 30 to 35% with cardiomyopathy felt to be nonischemic.  He was diagnosed with A-fib with RVR on 10/2018 and left AMA and was seen for follow-up on 3/21 but declined AC at the time.  He was seen for follow-up on 10/08/2020 after missing several follow-ups to reestablish care.  During visit patient did note some increased leg edema.  He has noted to have no current labs and a chest lipoma/cyst noted on exam but chest CTA ordered.  He was seen in the ED 3 days later with severe epigastric right upper quadrant abdominal pain.  He underwent ultrasound was found to have cholelithiasis and underwent evaluation by cardiology for preoperative clearance.  2D echo showed showed improved LVEF of 60 to 65%, grade 1 DD, normal RV, mild MR, mild AI, mild dilation of ascending aorta measuring 40 mmHg.  He was granted clearance but left AMA.  He was admitted on 12/02/2022 with altered mental status in the setting of Remeron and alcohol use.  He wore a ZIO monitor placed by PCP that showed burst and second-degree heart block with a minimum heart rate of 28 bpm.   Minimum heart rate noted early in the morning hours with no reoccurrence during waking hours.   Seen in office on 06/02/2023 for evaluation of bradycardia.  He was currently drinking half a gallon of vodka a week.  His carvedilol  was reduced to 12.5 mg twice daily.  Repeat echocardiogram is ordered however never completed.   Most recently he was admitted to the hospital on 7/19 for abdominal pains and vomiting and diagnosed with acute pancreatitis in the setting of alcohol use and cholelithiasis.  He does have a history of recurrent pancreatitis.  He was admitted on 06/13/2023 for acute pancreatitis as well.  He improved with conservative management.  He did undergo echocardiogram on 7/21 showing LVEF 60 to 65%, no RWMA, mild LVH, grade 1 DD, RV function and size normal, trivial aortic valve regurgitation.  He was last seen in clinic on 09/08/2023.  Reported he was doing well overall without acute cardiovascular concerns.  He was continued on his current GDMT of carvedilol , Entresto , and and furosemide .  He was started on aspirin  81 mg daily due to history of coronary artery disease.  He does have history of paroxysmal atrial fibrillation diagnosed in 2020 he previously did not anticoagulation in the past and continued to deny anticoagulation during office visit.  He was previously drinking half a gallon of vodka per week and now down to a couple beers and 3 bottles of wine per week.  He is not interested in [place].  His LDL was elevated at 81 and he  was not on lipid-lowering therapy.  He was started on rosuvastatin  10 mg daily.  Carotid duplex on 09/2023 showed right carotid with near normal and only minimal wall thickening or plaque and left carotid consistent with 1-39% stenosis.   Discussed the use of AI scribe software for clinical note transcription with the patient, who gave verbal consent to proceed.  History of Present Illness Leonard [name] is a 73 year old male with hypertension and coronary artery  disease who presents for follow-up of his blood pressure and cholesterol management.  Today he is doing well without acute cardiovascular concerns.    Blood pressure is elevated today as he did not take his medicine this morning as he was fasting during office visit.  He does not regularly monitor his blood pressure at home.  He takes his medications in a pharmacy prepared bubble pack.  He is not sure what medications he is actually taking he just takes them in the packs. He denies muscle aches, dyspnea, orthopnea, PND, syncope, presyncope, LEE, and chest pain.  He denies any symptoms concerning for recurrent atrial fibrillation.    He has reduced smoking from two packs a day to one pack every three to four days. Alcohol is consumed occasionally, but he recently had a night where he did drink a fifth or more of liquor.  He is cautious about not returning to frequent drinking.  He remains active and performs daily activities without significant limitations.  Review of systems:  Please see the history of present illness. All other systems are reviewed and otherwise negative.      Studies Reviewed:        Carotid duplex 09/23/2023 Right Carotid: The extracranial vessels were near-normal with only minimal  wall                thickening or plaque.   Left Carotid: Velocities in the left ICA are consistent with a 1-39%  stenosis.   Vertebrals: Left vertebral artery demonstrates antegrade flow. Right  vertebral              artery demonstrates no discernable flow.  Subclavians: Normal flow hemodynamics were seen in bilateral subclavian               arteries.   Echocardiogram 08/29/2023 1. Left ventricular ejection fraction, by estimation, is 60 to 65%. The  left ventricle has normal function. The left ventricle has no regional  wall motion abnormalities. There is mild concentric left ventricular  hypertrophy. Left ventricular diastolic  parameters are consistent with Grade I diastolic  dysfunction (impaired  relaxation).   2. Right ventricular systolic function is normal. The right ventricular  size is normal. Tricuspid regurgitation signal is inadequate for assessing  PA pressure.   3. The mitral valve is normal in structure. No evidence of mitral valve  regurgitation.   4. The aortic valve has an indeterminant number of cusps. Aortic valve  regurgitation is trivial. Aortic valve sclerosis is present, with no  evidence of aortic valve stenosis.    Cardiac catheterization 06/10/2016 Prox RCA lesion, 40 %stenosed. Dist RCA lesion, 25 %stenosed. Ramus lesion, 25 %stenosed. Mid LAD lesion, 20 %stenosed. There is moderate left ventricular systolic dysfunction. LV end diastolic pressure is mildly elevated. The left ventricular ejection fraction is 35-45% by visual estimate. There is no aortic valve stenosis. Ao sat 93%, PA sat 61%. CO 4.5 L/min. CI 2.2. Normal pulmonary artery pressures. No AAA. Mild left renal artery stenosis. No right renal artery stenosis.  Nonobstructive coronary artery disease.  Patient with nonischemic cardiomyopathy, likely related to elevated blood pressure. Diagnostic Dominance: Right   Risk Assessment/Calculations:    CHA2DS2-VASc Score = 4   This indicates a 4.8% annual risk of stroke. The patient's score is based upon: CHF History: 1 HTN History: 1 Diabetes History: 0 Stroke History: 0 Vascular Disease History: 1 Age Score: 1 Gender Score: 0    HYPERTENSION CONTROL Vitals:   12/02/23 1128 12/02/23 1209  BP: (!) 144/88 (!) 142/82    The patient's blood pressure is elevated above target today.  In order to address the patient's elevated BP: Blood pressure will be monitored at home to determine if medication changes need to be made.           Physical Exam:   VS:  BP (!) 142/82   Pulse 91   Ht 5' 8 (1.727 m)   Wt 153 lb 3.2 oz (69.5 kg)   SpO2 99%   BMI 23.29 kg/m    Wt Readings from Last 3 Encounters:  12/02/23  153 lb 3.2 oz (69.5 kg)  09/08/23 156 lb (70.8 kg)  08/27/23 160 lb (72.6 kg)    GEN: Well nourished, well developed in no acute distress NECK: No JVD; No carotid bruits CARDIAC: RRR, no murmurs, rubs, gallops RESPIRATORY:  Clear to auscultation without rales, wheezing or rhonchi  ABDOMEN: Soft, non-tender, non-distended EXTREMITIES:  No edema; No acute deformity      Assessment and Plan:  HFimpEF NICM Echocardiogram 08/2023 with LVEF 60 to 65%, no RWMA, mild LVH, grade 1 DD - He appears euvolemic and well compensated on exam.  He denies chest pains, dyspnea, orthopnea, PND, LEE - No changes to current therapy.   - Continue GDMT of carvedilol  12.5 mg twice daily, Entresto  97-103 mg twice daily, and furosemide  40 mg daily   Coronary artery disease Aortic atherosclerosis LHC 06/2016 with nonobstructive disease showing 40% proximal RCA, 25% distal RCA, 25% ramus, 20% mid LAD - Today p he is stable without chest pains.  Remains active without anginal symptoms.  No indication further ischemic evaluation at this time - Continue atorvastatin 40 mg daily and aspirin  81 mg daily   Paroxysmal atrial fibrillation Diagnosed in 2020 Has denied anticoagulation in the past ZIO 12/2022 (5 days, 15 hours) showed no episodes of atrial fibrillation - He denies anticoagulation today.  He understands his stroke risk - Discussed his CHA2DS2-VASc score today - He denies symptoms concerning for recurrent atrial fibrillation - He appears to maintain sinus rhythm on auscultation - Encouraged rhythm monitoring at home with a Kardia device - Continue carvedilol  12.5 mg twice daily  Hyperlipidemia, LDL goal <70 LDL 81 on 08/2023 - Repeat fasting lipid panel today - If not at goal can increase atorvastatin if needed - Continue atorvastatin 40 mg daily  Hypertension Blood pressure today is slightly elevated at 142/82 He did not take his blood pressure medicine this morning as he was fasting for office  visit - No changes.  Have asked him to monitor his blood pressure at home for target blood pressure goal less than 130/80 - Continue amlodipine  5 mg daily, carvedilol  25 mg twice daily, and Entresto  97-103 mg twice daily   Alcohol abuse Previously admitted 5/2 and 7/19 for acute pancreatitis Was previously drinking half a gallon of vodka per week - Not interested in AA - Has significantly reduced his alcohol consumption - Continue thiamine  and folic acid    Tobacco use Reports he is down to  1 pack every 3 to 4 days - Tobacco cessation encouraged   Carotid artery disease Carotid duplex 09/2023 with left ICA consistent with 1-39% stenosis - No interventions warranted at this time.  Can repeat in 2 years for routine monitoring - Continue atorvastatin 40 mg daily and aspirin  81 mg daily   Second-degree AV block ZIO 12/2022 (PCP, results under media) showed 2 episodes of second-degree AV block type I at 5 AM and 9 AM Carvedilol  was subsequently reduced on 05/2023.  A follow-up live heart monitor was placed on 05/2023 but was apparently lost - No episodes of syncope, lightheadedness, dizziness, bradycardia noted - No significant findings as episodes occurred in the early a.m. hours   Aneurysm of ascending aorta CT angio chest aorta 08/2023 showed ascending thoracic aorta measuring 4.1 cm - Plan for annual surveillance with CT in 08/2024       Dispo:  Return in about 6 months (around 06/01/2024).  Signed, Lum LITTIE Louis, NP

## 2023-12-02 NOTE — Patient Instructions (Addendum)
 Medication Instructions:  START TAKING ASPIRIN  EC 81 MG DAILY.  Lab Work: FASTING LIPID PANEL AND CMET TO BE DONE TODAY.  Testing/Procedures: NONE  Follow-Up: At [facility], you and your health needs are our priority.  As part of our continuing mission to provide you with exceptional heart care, our providers are all part of one team.  This team includes your primary Cardiologist (physician) and Advanced Practice Providers or APPs (Physician Assistants and Nurse Practitioners) who all work together to provide you with the care you need, when you need it.  Your next appointment:   6 MONTHS  Provider:   MADISON FOUNTAIN, NP

## 2023-12-03 LAB — LIPID PANEL
Chol/HDL Ratio: 2.8 ratio (ref 0.0–5.0)
Cholesterol, Total: 142 mg/dL (ref 100–199)
HDL: 50 mg/dL (ref >39)
LDL Chol Calc (NIH): 75 mg/dL (ref 0–99)
Triglycerides: 93 mg/dL (ref 0–149)
VLDL Cholesterol Cal: 17 mg/dL (ref 5–40)

## 2023-12-03 LAB — COMPREHENSIVE METABOLIC PANEL WITH GFR
ALT: 17 IU/L (ref 0–44)
AST: 23 IU/L (ref 0–40)
Albumin: 4.7 g/dL (ref 3.8–4.8)
Alkaline Phosphatase: 70 IU/L (ref 47–123)
BUN/Creatinine Ratio: 26 — ABNORMAL HIGH (ref 10–24)
BUN: 26 mg/dL (ref 8–27)
Bilirubin Total: 0.6 mg/dL (ref 0.0–1.2)
CO2: 19 mmol/L — ABNORMAL LOW (ref 20–29)
Calcium: 9.8 mg/dL (ref 8.6–10.2)
Chloride: 106 mmol/L (ref 96–106)
Creatinine, Ser: 1 mg/dL (ref 0.76–1.27)
Globulin, Total: 2.6 g/dL (ref 1.5–4.5)
Glucose: 74 mg/dL (ref 70–99)
Potassium: 4.5 mmol/L (ref 3.5–5.2)
Sodium: 141 mmol/L (ref 134–144)
Total Protein: 7.3 g/dL (ref 6.0–8.5)
eGFR: 79 mL/min/1.73 (ref >59)

## 2023-12-05 ENCOUNTER — Ambulatory Visit: Payer: Self-pay | Admitting: Emergency Medicine

## 2023-12-07 NOTE — Progress Notes (Signed)
Patient has been notified directly; all questions, if any, were answered. Patient voiced understanding.
